# Patient Record
Sex: Female | Born: 1950 | Race: White | Hispanic: No | Smoking: Never smoker
Health system: Southern US, Community
[De-identification: ages and names within clinical notes are randomized; demographics above are authoritative.]

## PROBLEM LIST (undated history)

## (undated) DIAGNOSIS — Z9889 Other specified postprocedural states: Secondary | ICD-10-CM

## (undated) DIAGNOSIS — H9190 Unspecified hearing loss, unspecified ear: Secondary | ICD-10-CM

## (undated) DIAGNOSIS — E119 Type 2 diabetes mellitus without complications: Secondary | ICD-10-CM

## (undated) DIAGNOSIS — F419 Anxiety disorder, unspecified: Secondary | ICD-10-CM

## (undated) DIAGNOSIS — I700 Atherosclerosis of aorta: Secondary | ICD-10-CM

## (undated) DIAGNOSIS — G43909 Migraine, unspecified, not intractable, without status migrainosus: Secondary | ICD-10-CM

## (undated) DIAGNOSIS — K219 Gastro-esophageal reflux disease without esophagitis: Secondary | ICD-10-CM

## (undated) DIAGNOSIS — Z7982 Long term (current) use of aspirin: Secondary | ICD-10-CM

## (undated) DIAGNOSIS — I10 Essential (primary) hypertension: Secondary | ICD-10-CM

## (undated) DIAGNOSIS — N189 Chronic kidney disease, unspecified: Secondary | ICD-10-CM

## (undated) DIAGNOSIS — Z7901 Long term (current) use of anticoagulants: Secondary | ICD-10-CM

## (undated) DIAGNOSIS — R112 Nausea with vomiting, unspecified: Secondary | ICD-10-CM

## (undated) DIAGNOSIS — D689 Coagulation defect, unspecified: Secondary | ICD-10-CM

## (undated) DIAGNOSIS — F329 Major depressive disorder, single episode, unspecified: Secondary | ICD-10-CM

## (undated) DIAGNOSIS — R519 Headache, unspecified: Secondary | ICD-10-CM

## (undated) DIAGNOSIS — N200 Calculus of kidney: Secondary | ICD-10-CM

## (undated) DIAGNOSIS — E538 Deficiency of other specified B group vitamins: Secondary | ICD-10-CM

## (undated) DIAGNOSIS — Z86711 Personal history of pulmonary embolism: Secondary | ICD-10-CM

## (undated) DIAGNOSIS — K559 Vascular disorder of intestine, unspecified: Secondary | ICD-10-CM

## (undated) DIAGNOSIS — F32A Depression, unspecified: Secondary | ICD-10-CM

## (undated) DIAGNOSIS — Z8619 Personal history of other infectious and parasitic diseases: Secondary | ICD-10-CM

## (undated) DIAGNOSIS — R7303 Prediabetes: Secondary | ICD-10-CM

## (undated) DIAGNOSIS — Z87442 Personal history of urinary calculi: Secondary | ICD-10-CM

## (undated) DIAGNOSIS — E785 Hyperlipidemia, unspecified: Secondary | ICD-10-CM

## (undated) HISTORY — DX: Vascular disorder of intestine, unspecified: K55.9

## (undated) HISTORY — PX: APPENDECTOMY: SHX54

## (undated) HISTORY — DX: Personal history of other infectious and parasitic diseases: Z86.19

## (undated) HISTORY — DX: Essential (primary) hypertension: I10

## (undated) HISTORY — DX: Chronic kidney disease, unspecified: N18.9

## (undated) HISTORY — DX: Personal history of pulmonary embolism: Z86.711

## (undated) HISTORY — DX: Hyperlipidemia, unspecified: E78.5

## (undated) HISTORY — PX: OTHER SURGICAL HISTORY: SHX169

## (undated) HISTORY — DX: Anxiety disorder, unspecified: F41.9

---
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
## 1982-01-06 HISTORY — PX: SPINE SURGERY: SHX786

## 1987-01-07 HISTORY — PX: BREAST EXCISIONAL BIOPSY: SUR124

## 1987-01-07 HISTORY — PX: BREAST SURGERY: SHX581

## 1994-01-06 HISTORY — PX: ABDOMINAL HYSTERECTOMY: SHX81

## 1999-01-07 DIAGNOSIS — Z8619 Personal history of other infectious and parasitic diseases: Secondary | ICD-10-CM

## 1999-01-07 HISTORY — DX: Personal history of other infectious and parasitic diseases: Z86.19

## 2004-12-11 ENCOUNTER — Ambulatory Visit: Payer: Self-pay | Admitting: Internal Medicine

## 2005-06-19 ENCOUNTER — Ambulatory Visit: Payer: Self-pay | Admitting: Internal Medicine

## 2006-04-06 LAB — HM COLONOSCOPY

## 2006-09-30 ENCOUNTER — Ambulatory Visit: Payer: Self-pay | Admitting: Internal Medicine

## 2006-11-11 ENCOUNTER — Ambulatory Visit: Payer: Self-pay | Admitting: Unknown Physician Specialty

## 2007-11-19 ENCOUNTER — Ambulatory Visit: Payer: Self-pay | Admitting: Internal Medicine

## 2008-07-24 ENCOUNTER — Ambulatory Visit: Payer: Self-pay | Admitting: Internal Medicine

## 2009-07-05 ENCOUNTER — Ambulatory Visit: Payer: Self-pay | Admitting: Unknown Physician Specialty

## 2009-07-06 ENCOUNTER — Ambulatory Visit: Payer: Self-pay | Admitting: Internal Medicine

## 2009-07-25 ENCOUNTER — Ambulatory Visit: Payer: Self-pay | Admitting: Unknown Physician Specialty

## 2009-07-27 LAB — PATHOLOGY REPORT

## 2009-08-02 ENCOUNTER — Inpatient Hospital Stay: Payer: Self-pay | Admitting: Unknown Physician Specialty

## 2009-08-06 ENCOUNTER — Ambulatory Visit: Payer: Self-pay | Admitting: Internal Medicine

## 2009-08-08 DIAGNOSIS — D689 Coagulation defect, unspecified: Secondary | ICD-10-CM

## 2009-08-08 HISTORY — DX: Coagulation defect, unspecified: D68.9

## 2009-08-23 ENCOUNTER — Other Ambulatory Visit: Payer: Self-pay | Admitting: Unknown Physician Specialty

## 2009-09-06 ENCOUNTER — Ambulatory Visit: Payer: Self-pay | Admitting: Internal Medicine

## 2009-09-06 DIAGNOSIS — Z86711 Personal history of pulmonary embolism: Secondary | ICD-10-CM

## 2009-09-06 HISTORY — DX: Personal history of pulmonary embolism: Z86.711

## 2009-09-07 ENCOUNTER — Inpatient Hospital Stay: Payer: Self-pay | Admitting: Internal Medicine

## 2009-09-11 LAB — CANCER ANTIGEN 19-9: CA 19-9: 21 U/mL (ref 0–35)

## 2009-09-26 ENCOUNTER — Ambulatory Visit: Payer: Self-pay | Admitting: Internal Medicine

## 2009-10-06 ENCOUNTER — Ambulatory Visit: Payer: Self-pay | Admitting: Internal Medicine

## 2009-10-18 ENCOUNTER — Ambulatory Visit: Payer: Self-pay | Admitting: Internal Medicine

## 2009-11-01 ENCOUNTER — Inpatient Hospital Stay: Payer: Self-pay | Admitting: Internal Medicine

## 2009-11-13 ENCOUNTER — Ambulatory Visit: Payer: Self-pay | Admitting: Internal Medicine

## 2010-02-27 ENCOUNTER — Ambulatory Visit: Payer: Self-pay | Admitting: Internal Medicine

## 2010-03-07 ENCOUNTER — Ambulatory Visit: Payer: Self-pay | Admitting: Internal Medicine

## 2010-04-07 ENCOUNTER — Ambulatory Visit: Payer: Self-pay

## 2010-04-07 ENCOUNTER — Ambulatory Visit: Payer: Self-pay | Admitting: Internal Medicine

## 2010-04-08 LAB — HM COLONOSCOPY

## 2010-05-08 ENCOUNTER — Ambulatory Visit: Payer: Self-pay | Admitting: Vascular Surgery

## 2010-07-04 ENCOUNTER — Ambulatory Visit: Payer: Self-pay | Admitting: Internal Medicine

## 2010-09-03 ENCOUNTER — Other Ambulatory Visit: Payer: Self-pay | Admitting: Internal Medicine

## 2010-09-03 NOTE — Telephone Encounter (Signed)
This was sent to me in error.

## 2010-09-03 NOTE — Telephone Encounter (Signed)
OK TO REFILL  WITH 3 ADDITIONAL REFILLS  

## 2010-09-04 ENCOUNTER — Other Ambulatory Visit: Payer: Self-pay | Admitting: Internal Medicine

## 2010-09-04 MED ORDER — ALPRAZOLAM 0.25 MG PO TABS: 0.2500 mg | ORAL_TABLET | Freq: Three times a day (TID) | ORAL | Status: DC | PRN

## 2010-09-05 ENCOUNTER — Telehealth: Payer: Self-pay | Admitting: Internal Medicine

## 2010-09-05 NOTE — Telephone Encounter (Signed)
Patient called and stated she had her PT/INR test done last week and still has not heard her results.

## 2010-09-06 ENCOUNTER — Telehealth: Payer: Self-pay | Admitting: Internal Medicine

## 2010-09-06 NOTE — Telephone Encounter (Signed)
This refill has already been sent to the pharmacy.  I called it in myself.

## 2010-09-06 NOTE — Telephone Encounter (Signed)
Please send Xanax refill to [facility]

## 2010-09-30 ENCOUNTER — Other Ambulatory Visit (INDEPENDENT_AMBULATORY_CARE_PROVIDER_SITE_OTHER): Payer: 59 | Admitting: *Deleted

## 2010-09-30 ENCOUNTER — Other Ambulatory Visit: Payer: Self-pay

## 2010-09-30 DIAGNOSIS — Z7901 Long term (current) use of anticoagulants: Secondary | ICD-10-CM

## 2010-10-01 LAB — PROTIME-INR

## 2010-10-29 ENCOUNTER — Other Ambulatory Visit (INDEPENDENT_AMBULATORY_CARE_PROVIDER_SITE_OTHER): Payer: 59 | Admitting: *Deleted

## 2010-10-29 DIAGNOSIS — Z7901 Long term (current) use of anticoagulants: Secondary | ICD-10-CM

## 2010-11-01 ENCOUNTER — Telehealth: Payer: Self-pay | Admitting: Internal Medicine

## 2010-11-01 DIAGNOSIS — Z7901 Long term (current) use of anticoagulants: Secondary | ICD-10-CM

## 2010-11-01 NOTE — Telephone Encounter (Signed)
Patient was called and told to increase her coumadin to 6 mg on Fridays.  Continue 5 mg on Sat,, Mon Tues and Thursday. Repeat PT/INR in 1 month.  Order in [facility]

## 2010-11-01 NOTE — Telephone Encounter (Signed)
Message copied by Duncan Dull on Fri Nov 01, 2010  6:16 PM ------      Message from: Darletta Moll A      Created: Fri Nov 01, 2010 12:13 PM       Patient stated she is taking 5 mg everyday except for Sunday and Wednesday she takes 6 mg.  Please advise.

## 2010-11-21 ENCOUNTER — Other Ambulatory Visit: Payer: Self-pay | Admitting: Internal Medicine

## 2010-11-22 ENCOUNTER — Telehealth: Payer: Self-pay | Admitting: Internal Medicine

## 2010-11-22 MED ORDER — WARFARIN SODIUM 5 MG PO TABS: 5.0000 mg | ORAL_TABLET | Freq: Every day | ORAL | Status: DC

## 2010-11-22 NOTE — Telephone Encounter (Signed)
I spoke with [name] about the prescription process that even though Dr. Darrick Huntsman sent to pharmacy it doesn't immediately show up at the pharmacy.

## 2010-11-22 NOTE — Telephone Encounter (Signed)
Patient returned my call I advised that Morrie Sheldon spoke with pharmacy and gave them a verbal on the prescription.  She questioned again why would the pharmacy say that they hadn't received the prescription.  I advised that I would contact our IT dept. And see if this was a problem across the system.

## 2010-11-22 NOTE — Telephone Encounter (Signed)
Patient called in this a.m stating that our system needed to get better.  She said that we were going to start loosing patients and she was going to be one of them.  I asked her what was wrong and she it takes a week to get her PT/INR results when she comes in.  When she calls in to get her prescription it takes several days to get them called in.  She stated that she called her pharmacy for her coumadin prescription yesterday they faxed to [place], and then electronically sent it in.  I looked in her chart and saw that her prescription was sent in by Dr. Darrick Huntsman this morning at 6:46, patient states she had just got off the phone with the pharmacy before calling us and they didn't have it.  I called Carlyon Shadow. Into my office to make sure I was looking at it right, she said yes.  I asked the patient if I could call her back after I called the pharmacy she said yes.  I called the pharmacy to see if they had received the prescription she said no, so I asked Morrie Sheldon C. To give prescription verbally. Called the patient back and left a message for her to return my call.

## 2010-12-02 ENCOUNTER — Other Ambulatory Visit (INDEPENDENT_AMBULATORY_CARE_PROVIDER_SITE_OTHER): Payer: 59 | Admitting: *Deleted

## 2010-12-02 DIAGNOSIS — Z7901 Long term (current) use of anticoagulants: Secondary | ICD-10-CM

## 2010-12-03 ENCOUNTER — Other Ambulatory Visit: Payer: Self-pay | Admitting: Internal Medicine

## 2010-12-03 DIAGNOSIS — Z7901 Long term (current) use of anticoagulants: Secondary | ICD-10-CM

## 2010-12-09 ENCOUNTER — Telehealth: Payer: Self-pay | Admitting: Internal Medicine

## 2010-12-09 ENCOUNTER — Other Ambulatory Visit (INDEPENDENT_AMBULATORY_CARE_PROVIDER_SITE_OTHER): Payer: 59 | Admitting: *Deleted

## 2010-12-09 DIAGNOSIS — Z7901 Long term (current) use of anticoagulants: Secondary | ICD-10-CM

## 2010-12-09 LAB — PROTIME-INR

## 2010-12-09 NOTE — Telephone Encounter (Signed)
Notified patient [facility] did not result her labs because they said "Testing not performed due to the age of this specimen".  The labs were drawn on 12/02/10 around 4:30 that evening, [facility] was called by Morrie Sheldon C notifying them we had a lab to pick up.  [facility] did not come get the specimen until the following day (12/03/10).  On the results form [facility] did not result the labs until 12/05/10 by which it was an old specimen.  We did not receive the results form with all of this information until 12/06/10.  Patient has been faxed a copy of the form, she stated she is friends with the manager in that department and will let her know of this.  Patient is an employee of [facility].  Patient came back today to have the lab redrawn, and we have called [facility] and made it a STAT lab.

## 2010-12-09 NOTE — Progress Notes (Signed)
Addended by: Jobie Quaker on: 12/09/2010 01:45 PM   Modules accepted: Orders

## 2010-12-25 ENCOUNTER — Other Ambulatory Visit: Payer: Self-pay | Admitting: *Deleted

## 2010-12-25 MED ORDER — WARFARIN SODIUM 5 MG PO TABS: ORAL_TABLET | ORAL | Status: DC

## 2010-12-26 ENCOUNTER — Telehealth: Payer: Self-pay | Admitting: Internal Medicine

## 2010-12-26 MED ORDER — WARFARIN SODIUM 6 MG PO TABS: 6.0000 mg | ORAL_TABLET | Freq: Every day | ORAL | Status: DC

## 2010-12-26 NOTE — Telephone Encounter (Signed)
[phone] Pt called her pharmacy faxed request for 6mg  [facility] Pt is completely  out of meds Please advise pt when this was called in You can leave message on phone

## 2011-01-01 ENCOUNTER — Other Ambulatory Visit: Payer: Self-pay | Admitting: *Deleted

## 2011-01-03 NOTE — Telephone Encounter (Signed)
Opened in error

## 2011-01-09 ENCOUNTER — Other Ambulatory Visit: Payer: Self-pay | Admitting: Internal Medicine

## 2011-01-09 ENCOUNTER — Telehealth: Payer: Self-pay | Admitting: Internal Medicine

## 2011-01-09 DIAGNOSIS — Z7901 Long term (current) use of anticoagulants: Secondary | ICD-10-CM

## 2011-01-09 NOTE — Telephone Encounter (Signed)
Standing order faxed to [facility]

## 2011-01-09 NOTE — Telephone Encounter (Signed)
[phone] Pt called she was to have protime done at [facility] on [address] today because she is [facility] employees.  The standing order was not there for them to do her labs.  Pt was under the impression this was already done with you Please call and advise pt

## 2011-01-12 ENCOUNTER — Telehealth: Payer: Self-pay | Admitting: Internal Medicine

## 2011-01-12 NOTE — Telephone Encounter (Signed)
Her INR on Jan  Was 1.9

## 2011-01-12 NOTE — Telephone Encounter (Signed)
Her recent PT/INR was 1.9  If she is currently taking 6 mg daily , I would like her to take 9 mg two days per week and 6 mg all other days and repeat  PT/INR in 2 weeks  . She gets her PT/INR at [facility] so she will need na order placed.

## 2011-01-13 NOTE — Telephone Encounter (Signed)
No , she should increase to 6 mg daily every day and please update her chart with correct regimen.,  thanks

## 2011-01-13 NOTE — Telephone Encounter (Signed)
Advised pt of instructions.  Do you still want her to recheck in 2 weeks?

## 2011-01-13 NOTE — Telephone Encounter (Signed)
Pt states she is taking 6 mg's three days a week and 5 the other days.  Do you still want her to increase to 9 two days a week?

## 2011-01-13 NOTE — Telephone Encounter (Signed)
Yes, thank you.

## 2011-01-14 NOTE — Telephone Encounter (Signed)
Advised pt, she will fax over standing order for your signature.

## 2011-01-22 ENCOUNTER — Encounter: Payer: Self-pay | Admitting: Internal Medicine

## 2011-01-29 ENCOUNTER — Telehealth: Payer: Self-pay | Admitting: Internal Medicine

## 2011-01-29 NOTE — Telephone Encounter (Signed)
Patient notified

## 2011-01-29 NOTE — Telephone Encounter (Signed)
Her PT/INR is fine at 2.8  Continue current coumadin schedule and repeat in on month

## 2011-02-10 ENCOUNTER — Encounter: Payer: Self-pay | Admitting: Internal Medicine

## 2011-02-26 LAB — PROTIME-INR

## 2011-03-03 ENCOUNTER — Telehealth: Payer: Self-pay | Admitting: Internal Medicine

## 2011-03-03 NOTE — Telephone Encounter (Signed)
PT/INR is therapeutic at 2.5  Repeat in on e month.  Patient has already been called.

## 2011-03-03 NOTE — Telephone Encounter (Signed)
Patient has been notified

## 2011-03-18 ENCOUNTER — Encounter: Payer: Self-pay | Admitting: Internal Medicine

## 2011-03-27 ENCOUNTER — Telehealth: Payer: Self-pay | Admitting: Internal Medicine

## 2011-03-27 NOTE — Telephone Encounter (Signed)
Please tell [name] that her PT/INR is fine on 6 mg daily.  Repeat in 1 month

## 2011-03-28 NOTE — Telephone Encounter (Signed)
Notified patient of results 

## 2011-04-06 ENCOUNTER — Encounter: Payer: Self-pay | Admitting: Internal Medicine

## 2011-04-06 DIAGNOSIS — Z86711 Personal history of pulmonary embolism: Secondary | ICD-10-CM | POA: Insufficient documentation

## 2011-04-06 DIAGNOSIS — Z8619 Personal history of other infectious and parasitic diseases: Secondary | ICD-10-CM | POA: Insufficient documentation

## 2011-04-06 DIAGNOSIS — I10 Essential (primary) hypertension: Secondary | ICD-10-CM | POA: Insufficient documentation

## 2011-04-08 ENCOUNTER — Encounter: Payer: Self-pay | Admitting: Internal Medicine

## 2011-04-08 ENCOUNTER — Ambulatory Visit (INDEPENDENT_AMBULATORY_CARE_PROVIDER_SITE_OTHER): Payer: 59 | Admitting: Internal Medicine

## 2011-04-08 VITALS — BP 112/64 | HR 89 | Temp 97.9°F | Resp 16 | Ht 61.5 in | Wt 162.8 lb

## 2011-04-08 DIAGNOSIS — K551 Chronic vascular disorders of intestine: Secondary | ICD-10-CM

## 2011-04-08 DIAGNOSIS — Z79899 Other long term (current) drug therapy: Secondary | ICD-10-CM

## 2011-04-08 DIAGNOSIS — Z1239 Encounter for other screening for malignant neoplasm of breast: Secondary | ICD-10-CM

## 2011-04-08 DIAGNOSIS — R5383 Other fatigue: Secondary | ICD-10-CM

## 2011-04-08 DIAGNOSIS — I10 Essential (primary) hypertension: Secondary | ICD-10-CM

## 2011-04-08 DIAGNOSIS — Z86711 Personal history of pulmonary embolism: Secondary | ICD-10-CM

## 2011-04-08 DIAGNOSIS — E785 Hyperlipidemia, unspecified: Secondary | ICD-10-CM

## 2011-04-08 HISTORY — DX: Chronic vascular disorders of intestine: K55.1

## 2011-04-08 MED ORDER — ALPRAZOLAM 0.25 MG PO TABS: 0.2500 mg | ORAL_TABLET | Freq: Three times a day (TID) | ORAL | Status: AC | PRN

## 2011-04-08 MED ORDER — IMIPRAMINE HCL 25 MG PO TABS: 25.0000 mg | ORAL_TABLET | Freq: Every day | ORAL | Status: DC

## 2011-04-08 MED ORDER — SIMVASTATIN 40 MG PO TABS: 40.0000 mg | ORAL_TABLET | Freq: Every day | ORAL | Status: DC

## 2011-04-08 MED ORDER — BUTALBITAL-APAP-CAFFEINE 50-325-40 MG PO TABS: 1.0000 | ORAL_TABLET | ORAL | Status: DC | PRN

## 2011-04-08 MED ORDER — LISINOPRIL-HYDROCHLOROTHIAZIDE 20-25 MG PO TABS: 1.0000 | ORAL_TABLET | Freq: Every day | ORAL | Status: DC

## 2011-04-08 MED ORDER — OXYCODONE-ACETAMINOPHEN 10-325 MG PO TABS: 1.0000 | ORAL_TABLET | Freq: Three times a day (TID) | ORAL | Status: AC | PRN

## 2011-04-08 MED ORDER — METOPROLOL SUCCINATE ER 25 MG PO TB24: 25.0000 mg | ORAL_TABLET | Freq: Every day | ORAL | Status: DC

## 2011-04-08 NOTE — Assessment & Plan Note (Signed)
Well controlled, but having recurrent orthostasis.  Will change meds to toprol as a trial.

## 2011-04-08 NOTE — Patient Instructions (Addendum)
My cell phone is [phone], , my  E mail is  [email or web address]  We are switching bp meds to toprol xl 25   One tablet daily to see if it helps the dizziness

## 2011-04-08 NOTE — Progress Notes (Signed)
Patient ID: [name], female   DOB: [birth year], 61 y.o.   MRN: [id number]   Patient Active Problem List  Diagnoses  . Hypertension  . H/O toxic shock syndrome  . History of pulmonary embolus (PE)  . Chronic ischemic colitis, enteritis, or enterocolitis    Subjective:  CC:   Chief Complaint  Patient presents with  . Annual Exam    HPI:   Emma Huyett Handyis a 61 y.o. female who presents 8 month follow up for annual exam.   She is s/p hysterectomy and does not need a pelvic or PAP smear .  Does her own  monthly breast exams,  Has a tender area on left breast which comes and goes,  Due for mammogram.  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   Past Medical History  Diagnosis Date  . Hypertension   . Hyperlipidemia   . H/O toxic shock syndrome 2001    post urologic procedure for stone removal  . History of pulmonary embolus (PE) Sept 2011    secondary to Protein c& S deficiency, Lupus ACA    Past Surgical History  Procedure Date  . Spine surgery 1984    lumbar diskectomy, [place]  . Breast surgery 1989    left biopsy, normal  . Abdominal hysterectomy 1996         The following portions of the patient's history were reviewed and updated as appropriate: Allergies, current medications, and problem list.    Review of Systems:   12 Pt  review of systems was negative except those addressed in the HPI,     History   Social History  . Marital Status: Married    Spouse Name: N/A    Number of Children: N/A  . Years of Education: N/A   Occupational History  . Not on file.   Social History Main Topics  . Smoking status: Never Smoker   . Smokeless tobacco: Never Used  . Alcohol Use: No  . Drug Use: No  . Sexually Active: Not on file   Other  Topics Concern  . Not on file   Social History Narrative  . No narrative on file    Objective:  BP 112/64  Pulse 89  Temp(Src) 97.9 F (36.6 C) (Oral)  Resp 16  Ht 5' 1.5" (1.562 m)  Wt 162 lb 12 oz (73.823 kg)  BMI 30.25 kg/m2  SpO2 96%  General appearance: alert, cooperative and appears stated age Ears: normal TM's and external ear canals both ears Throat: lips, mucosa, and tongue normal; teeth and gums normal Neck: no adenopathy, no carotid bruit, supple, symmetrical, trachea midline and thyroid not enlarged, symmetric, no tenderness/mass/nodules Back: symmetric, no curvature. ROM normal. No CVA tenderness. Lungs: clear to auscultation bilaterally Heart: regular rate and rhythm, S1, S2 normal, no murmur, click, rub or gallop Abdomen: soft, non-tender; bowel sounds normal; no masses,  no organomegaly Pulses: 2+ and symmetric Skin: Skin color, texture, turgor normal. No rashes or lesions Lymph nodes: Cervical, supraclavicular, and axillary nodes normal.  Assessment and Plan:  Hypertension Well controlled, but having recurrent orthostasis.  Will change meds to toprol as a trial.   Chronic ischemic colitis, enteritis, or enterocolitis S/p attempted angioplasty by [facility].  Vessel too small.  Tried entocort by [employer] which calmed things down  for a while.  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  Lifelong coumadin recommended.  Monthly PT/INRs     Updated Medication List Outpatient Encounter Prescriptions as of 04/08/2011  Medication Sig Dispense Refill  . ALPRAZolam (XANAX) 0.25 MG tablet Take 1 tablet (0.25 mg total) by mouth 3 (three) times daily as needed for anxiety.  270 tablet  3  . aspirin 81 MG tablet Take 81 mg by mouth daily.      . butalbital-acetaminophen-caffeine (FIORICET, ESGIC) 50-325-40 MG per tablet Take 1 tablet by mouth as needed for headache.  90 tablet  3  .  Cholecalciferol (VITAMIN D3) 1000 UNITS CAPS Take by mouth.      Marland Kitchen imipramine (TOFRANIL) 25 MG tablet Take 1 tablet (25 mg total) by mouth at bedtime.  90 tablet  3  . omeprazole (PRILOSEC) 20 MG capsule Take 20 mg by mouth daily.      Marland Kitchen saccharomyces boulardii (FLORASTOR) 250 MG capsule Take 250 mg by mouth daily.      . simvastatin (ZOCOR) 40 MG tablet Take 1 tablet (40 mg total) by mouth at bedtime.  90 tablet  3  . warfarin (COUMADIN) 6 MG tablet Take 1 tablet (6 mg total) by mouth daily.  30 tablet  6  . DISCONTD: ALPRAZolam (XANAX) 0.25 MG tablet Take 1 tablet (0.25 mg total) by mouth 3 (three) times daily as needed for anxiety.  30 tablet  3  . DISCONTD: butalbital-acetaminophen-caffeine (FIORICET, ESGIC) 50-325-40 MG per tablet TAKE 1 TABLET BY MOUTH EVERY DAY AS NEEDED FOR PAIN  30 tablet  5  . DISCONTD: imipramine (TOFRANIL) 25 MG tablet Take 25 mg by mouth at bedtime.      Marland Kitchen DISCONTD: lisinopril-hydrochlorothiazide (PRINZIDE,ZESTORETIC) 20-25 MG per tablet Take 1 tablet by mouth daily.      Marland Kitchen DISCONTD: lisinopril-hydrochlorothiazide (PRINZIDE,ZESTORETIC) 20-25 MG per tablet Take 1 tablet by mouth daily.  90 tablet  3  . DISCONTD: simvastatin (ZOCOR) 40 MG tablet TAKE 1 TABLET BY MOUTH DAILY  90 tablet  2  . metoprolol succinate (TOPROL-XL) 25 MG 24 hr tablet Take 1 tablet (25 mg total) by mouth daily.  30 tablet  3  . oxyCODONE-acetaminophen (PERCOCET) 10-325 MG per tablet Take 1 tablet by mouth every 8 (eight) hours as needed for pain.  30 tablet  0     Orders Placed This Encounter  Procedures  . MM Digital Screening  . CBC with Differential  . Hepatic function panel  . Basic metabolic panel  . Lipid panel  . TSH  . HM COLONOSCOPY    No Follow-up on file.

## 2011-04-08 NOTE — Assessment & Plan Note (Signed)
Secondary to Protein C  And S deficiency.  Lifelong coumadin recommended.  Monthly PT/INRs

## 2011-04-08 NOTE — Assessment & Plan Note (Signed)
S/p attempted angioplasty by [employer].  Vessel too small.  Tried entocort by Mechele Collin which calmed things down for a while.  but she does not have a diagnosis of Crohn's

## 2011-04-17 ENCOUNTER — Other Ambulatory Visit: Payer: Self-pay | Admitting: Internal Medicine

## 2011-04-19 LAB — COMPREHENSIVE METABOLIC PANEL
ALT: 22 IU/L (ref 0–32)
AST: 19 IU/L (ref 0–40)
Albumin/Globulin Ratio: 2.1 (ref 1.1–2.5)
Alkaline Phosphatase: 84 IU/L (ref 25–165)
BUN/Creatinine Ratio: 21 (ref 11–26)
Calcium: 9.1 mg/dL (ref 8.6–10.2)
Creatinine, Ser: 0.94 mg/dL (ref 0.57–1.00)
GFR calc Af Amer: 75 mL/min/{1.73_m2} (ref >59)
GFR calc non Af Amer: 65 mL/min/{1.73_m2} (ref >59)
Globulin, Total: 2 g/dL (ref 1.5–4.5)
Potassium: 3.8 mmol/L (ref 3.5–5.2)
Sodium: 140 mmol/L (ref 134–144)
Total Bilirubin: 0.3 mg/dL (ref 0.0–1.2)

## 2011-04-19 LAB — CBC WITH DIFFERENTIAL
Basophils Absolute: 0 x10E3/uL (ref 0.0–0.2)
Basos: 1 % (ref 0–3)
Eos: 5 % (ref 0–7)
Eosinophils Absolute: 0.2 x10E3/uL (ref 0.0–0.4)
HCT: 43.8 % (ref 34.0–46.6)
Hemoglobin: 14.4 g/dL (ref 11.1–15.9)
Immature Grans (Abs): 0 x10E3/uL (ref 0.0–0.1)
Immature Granulocytes: 0 % (ref 0–2)
Lymphocytes Absolute: 1.8 x10E3/uL (ref 0.7–4.5)
Lymphs: 42 % (ref 14–46)
MCH: 28.9 pg (ref 26.6–33.0)
MCHC: 32.9 g/dL (ref 31.5–35.7)
MCV: 88 fL (ref 79–97)
Monocytes Absolute: 0.4 x10E3/uL (ref 0.1–1.0)
Monocytes: 10 % (ref 4–13)
Neutrophils Absolute: 1.8 x10E3/uL (ref 1.8–7.8)
Neutrophils Relative %: 42 % (ref 40–74)
Platelets: 176 x10E3/uL (ref 140–415)
RBC: 4.98 x10E6/uL (ref 3.77–5.28)
RDW: 14.3 % (ref 12.3–15.4)
WBC: 4.2 x10E3/uL (ref 4.0–10.5)

## 2011-04-19 LAB — LIPID PANEL W/O CHOL/HDL RATIO: HDL: 53 mg/dL (ref >39)

## 2011-04-21 ENCOUNTER — Other Ambulatory Visit: Payer: Self-pay | Admitting: Internal Medicine

## 2011-04-21 DIAGNOSIS — R5383 Other fatigue: Secondary | ICD-10-CM

## 2011-04-30 LAB — PROTIME-INR

## 2011-05-04 ENCOUNTER — Inpatient Hospital Stay: Payer: Self-pay | Admitting: Internal Medicine

## 2011-05-04 LAB — COMPREHENSIVE METABOLIC PANEL
Albumin: 3.7 g/dL (ref 3.4–5.0)
Alkaline Phosphatase: 105 U/L (ref 50–136)
Anion Gap: 8 (ref 7–16)
Calcium, Total: 8.8 mg/dL (ref 8.5–10.1)
Chloride: 105 mmol/L (ref 98–107)
Co2: 30 mmol/L (ref 21–32)
Creatinine: 1.23 mg/dL (ref 0.60–1.30)
Osmolality: 288 (ref 275–301)
Potassium: 3.8 mmol/L (ref 3.5–5.1)
SGOT(AST): 26 U/L (ref 15–37)
SGPT (ALT): 29 U/L
Sodium: 143 mmol/L (ref 136–145)

## 2011-05-04 LAB — CBC
HCT: 42.8 % (ref 35.0–47.0)
MCV: 87 fL (ref 80–100)
RBC: 4.91 10*6/uL (ref 3.80–5.20)
WBC: 13 10*3/uL — ABNORMAL HIGH (ref 3.6–11.0)

## 2011-05-04 LAB — LIPASE, BLOOD: Lipase: 170 U/L (ref 73–393)

## 2011-05-04 LAB — URINALYSIS, COMPLETE
Bilirubin,UR: NEGATIVE
Blood: NEGATIVE
Glucose,UR: NEGATIVE mg/dL (ref 0–75)
Granular Cast: 12
Ketone: NEGATIVE
Ph: 6 (ref 4.5–8.0)
Protein: NEGATIVE
RBC,UR: 1 /HPF (ref 0–5)
WBC UR: 3 /HPF (ref 0–5)

## 2011-05-04 LAB — PROTIME-INR
INR: 2.2
Prothrombin Time: 24.4 secs — ABNORMAL HIGH (ref 11.5–14.7)

## 2011-05-05 LAB — CBC WITH DIFFERENTIAL/PLATELET
Eosinophil #: 0.2 10*3/uL (ref 0.0–0.7)
Eosinophil %: 5.2 %
HCT: 37 % (ref 35.0–47.0)
Lymphocyte #: 1.8 10*3/uL (ref 1.0–3.6)
Lymphocyte %: 39.9 %
MCH: 28.9 pg (ref 26.0–34.0)
MCHC: 32.9 g/dL (ref 32.0–36.0)
MCV: 88 fL (ref 80–100)
Monocyte #: 0.3 x10 3/mm (ref 0.2–0.9)
Monocyte %: 7.2 %
Neutrophil %: 47 %
Platelet: 159 10*3/uL (ref 150–440)
RBC: 4.2 10*6/uL (ref 3.80–5.20)
RDW: 14.1 % (ref 11.5–14.5)
WBC: 4.5 10*3/uL (ref 3.6–11.0)

## 2011-05-05 LAB — BASIC METABOLIC PANEL
Anion Gap: 6 — ABNORMAL LOW (ref 7–16)
BUN: 11 mg/dL (ref 7–18)
Chloride: 109 mmol/L — ABNORMAL HIGH (ref 98–107)
Glucose: 98 mg/dL (ref 65–99)
Osmolality: 282 (ref 275–301)

## 2011-05-05 LAB — PROTIME-INR: Prothrombin Time: 29.4 secs — ABNORMAL HIGH (ref 11.5–14.7)

## 2011-05-06 ENCOUNTER — Telehealth: Payer: Self-pay | Admitting: Internal Medicine

## 2011-05-06 DIAGNOSIS — Z7901 Long term (current) use of anticoagulants: Secondary | ICD-10-CM

## 2011-05-06 LAB — OCCULT BLOOD X 1 CARD TO LAB, STOOL: Occult Blood, Feces: NEGATIVE

## 2011-05-06 LAB — PROTIME-INR: INR: 2.8

## 2011-05-06 NOTE — Telephone Encounter (Signed)
Left detailed message notifying patient. Also, the reason that these are not coming across through the computer because she was given a written order for 12 months for her pt/inr, they only come back through the computer when they are ordered through the computer.

## 2011-05-06 NOTE — Telephone Encounter (Signed)
Ok, thanks.  As dicussed, can we cancel them out and redo them through the portal?  Patient is in the hospital right now so do not worry about trying to reach her.

## 2011-05-06 NOTE — Telephone Encounter (Signed)
Coumadin level is therapeutic.  I don't understand why we aren't receiving these in a timelier fashion through the [facility] portal.,  Morrie Sheldon can you look into this?

## 2011-05-06 NOTE — Telephone Encounter (Signed)
Okay orders entered through computer so next time they should come across into her chart.

## 2011-05-07 HISTORY — PX: COLECTOMY: SHX59

## 2011-05-07 LAB — PROTIME-INR
INR: 3.1
Prothrombin Time: 32.3 secs — ABNORMAL HIGH (ref 11.5–14.7)

## 2011-05-07 LAB — BASIC METABOLIC PANEL
Calcium, Total: 8 mg/dL — ABNORMAL LOW (ref 8.5–10.1)
Chloride: 108 mmol/L — ABNORMAL HIGH (ref 98–107)
Co2: 26 mmol/L (ref 21–32)
Creatinine: 0.96 mg/dL (ref 0.60–1.30)
EGFR (African American): >60
EGFR (Non-African Amer.): >60
Glucose: 106 mg/dL — ABNORMAL HIGH (ref 65–99)
Osmolality: 286 (ref 275–301)
Potassium: 3.5 mmol/L (ref 3.5–5.1)
Sodium: 144 mmol/L (ref 136–145)

## 2011-05-13 ENCOUNTER — Inpatient Hospital Stay: Payer: Self-pay | Admitting: Internal Medicine

## 2011-05-13 LAB — CBC WITH DIFFERENTIAL/PLATELET
Basophil #: 0 10*3/uL (ref 0.0–0.1)
Basophil %: 0.8 %
Eosinophil #: 0.2 10*3/uL (ref 0.0–0.7)
Eosinophil %: 3 %
HCT: 40.1 % (ref 35.0–47.0)
HGB: 13.2 g/dL (ref 12.0–16.0)
Lymphocyte #: 2.5 10*3/uL (ref 1.0–3.6)
Lymphocyte %: 45 %
MCH: 29 pg (ref 26.0–34.0)
Monocyte #: 0.5 x10 3/mm (ref 0.2–0.9)
Monocyte %: 9.2 %
Neutrophil #: 2.3 10*3/uL (ref 1.4–6.5)
RBC: 4.55 10*6/uL (ref 3.80–5.20)
RDW: 14.3 % (ref 11.5–14.5)
WBC: 5.5 10*3/uL (ref 3.6–11.0)

## 2011-05-13 LAB — BASIC METABOLIC PANEL
Anion Gap: 6 — ABNORMAL LOW (ref 7–16)
Calcium, Total: 8.7 mg/dL (ref 8.5–10.1)
Chloride: 105 mmol/L (ref 98–107)
Creatinine: 0.93 mg/dL (ref 0.60–1.30)
EGFR (Non-African Amer.): >60
Glucose: 86 mg/dL (ref 65–99)
Osmolality: 285 (ref 275–301)
Sodium: 142 mmol/L (ref 136–145)

## 2011-05-13 LAB — PROTIME-INR
INR: 2.3
Prothrombin Time: 25.4 secs — ABNORMAL HIGH (ref 11.5–14.7)

## 2011-05-14 ENCOUNTER — Encounter: Payer: Self-pay | Admitting: Internal Medicine

## 2011-05-14 ENCOUNTER — Ambulatory Visit: Payer: 59 | Admitting: Internal Medicine

## 2011-05-14 LAB — BASIC METABOLIC PANEL
Anion Gap: 7 (ref 7–16)
Co2: 28 mmol/L (ref 21–32)
EGFR (African American): >60
EGFR (Non-African Amer.): >60
Glucose: 98 mg/dL (ref 65–99)
Osmolality: 277 (ref 275–301)
Sodium: 138 mmol/L (ref 136–145)

## 2011-05-14 LAB — CBC WITH DIFFERENTIAL/PLATELET
Basophil %: 1.3 %
Eosinophil #: 0.2 10*3/uL (ref 0.0–0.7)
Eosinophil %: 4.1 %
HCT: 37.7 % (ref 35.0–47.0)
MCH: 29 pg (ref 26.0–34.0)
MCHC: 33.2 g/dL (ref 32.0–36.0)
MCV: 87 fL (ref 80–100)
Monocyte #: 0.4 x10 3/mm (ref 0.2–0.9)
Monocyte %: 8.3 %
Neutrophil %: 33.6 %
RDW: 14.4 % (ref 11.5–14.5)

## 2011-05-14 LAB — APTT
Activated PTT: >160 secs (ref 23.6–35.9)
Activated PTT: 82.9 secs — ABNORMAL HIGH (ref 23.6–35.9)

## 2011-05-14 LAB — PROTIME-INR
INR: 2.4
Prothrombin Time: 26.3 secs — ABNORMAL HIGH (ref 11.5–14.7)

## 2011-05-14 LAB — HEMOGLOBIN: HGB: 12.4 g/dL (ref 12.0–16.0)

## 2011-05-15 LAB — BASIC METABOLIC PANEL
Anion Gap: 7 (ref 7–16)
Calcium, Total: 8 mg/dL — ABNORMAL LOW (ref 8.5–10.1)
Chloride: 106 mmol/L (ref 98–107)
Co2: 28 mmol/L (ref 21–32)
Creatinine: 0.86 mg/dL (ref 0.60–1.30)
EGFR (Non-African Amer.): >60
Glucose: 95 mg/dL (ref 65–99)
Potassium: 3.4 mmol/L — ABNORMAL LOW (ref 3.5–5.1)

## 2011-05-16 LAB — APTT
Activated PTT: 88.3 secs — ABNORMAL HIGH (ref 23.6–35.9)
Activated PTT: 66 secs — ABNORMAL HIGH (ref 23.6–35.9)

## 2011-05-16 LAB — HEMOGLOBIN: HGB: 12.4 g/dL (ref 12.0–16.0)

## 2011-05-16 LAB — PLATELET COUNT: Platelet: 141 10*3/uL — ABNORMAL LOW (ref 150–440)

## 2011-05-18 LAB — CBC WITH DIFFERENTIAL/PLATELET
Basophil #: 0 10*3/uL (ref 0.0–0.1)
HCT: 36.4 % (ref 35.0–47.0)
Lymphocyte #: 2.1 10*3/uL (ref 1.0–3.6)
Lymphocyte %: 48.7 %
MCH: 29.1 pg (ref 26.0–34.0)
MCV: 86 fL (ref 80–100)
Monocyte %: 9.3 %
Neutrophil #: 1.5 10*3/uL (ref 1.4–6.5)
Platelet: 142 10*3/uL — ABNORMAL LOW (ref 150–440)

## 2011-05-18 LAB — HEPATIC FUNCTION PANEL A (ARMC)
Albumin: 3.1 g/dL — ABNORMAL LOW (ref 3.4–5.0)
Alkaline Phosphatase: 58 U/L (ref 50–136)
Bilirubin, Direct: 0.1 mg/dL (ref 0.00–0.20)
Bilirubin,Total: 0.5 mg/dL (ref 0.2–1.0)
SGOT(AST): 28 U/L (ref 15–37)
SGPT (ALT): 28 U/L
Total Protein: 6.1 g/dL — ABNORMAL LOW (ref 6.4–8.2)

## 2011-05-18 LAB — BASIC METABOLIC PANEL
BUN: 6 mg/dL — ABNORMAL LOW (ref 7–18)
Calcium, Total: 8.2 mg/dL — ABNORMAL LOW (ref 8.5–10.1)
Chloride: 109 mmol/L — ABNORMAL HIGH (ref 98–107)
Co2: 24 mmol/L (ref 21–32)
EGFR (African American): >60
EGFR (Non-African Amer.): >60
Glucose: 95 mg/dL (ref 65–99)
Osmolality: 281 (ref 275–301)
Potassium: 3.5 mmol/L (ref 3.5–5.1)

## 2011-05-18 LAB — APTT: Activated PTT: 87 secs — ABNORMAL HIGH (ref 23.6–35.9)

## 2011-05-18 LAB — PROTIME-INR
INR: 1.1
Prothrombin Time: 14.9 secs — ABNORMAL HIGH (ref 11.5–14.7)

## 2011-05-19 LAB — APTT
Activated PTT: 109 secs — ABNORMAL HIGH (ref 23.6–35.9)
Activated PTT: 27.2 secs (ref 23.6–35.9)

## 2011-05-20 LAB — CBC WITH DIFFERENTIAL/PLATELET
Basophil #: 0 10*3/uL (ref 0.0–0.1)
Eosinophil %: 0 %
HCT: 39.3 % (ref 35.0–47.0)
Lymphocyte #: 0.5 10*3/uL — ABNORMAL LOW (ref 1.0–3.6)
Lymphocyte %: 6.3 %
MCH: 29.1 pg (ref 26.0–34.0)
MCV: 88 fL (ref 80–100)
Monocyte %: 7.6 %
RBC: 4.48 10*6/uL (ref 3.80–5.20)
RDW: 14.2 % (ref 11.5–14.5)
WBC: 7.2 10*3/uL (ref 3.6–11.0)

## 2011-05-20 LAB — BASIC METABOLIC PANEL
Anion Gap: 9 (ref 7–16)
BUN: 11 mg/dL (ref 7–18)
Chloride: 112 mmol/L — ABNORMAL HIGH (ref 98–107)
Creatinine: 1.17 mg/dL (ref 0.60–1.30)
Sodium: 144 mmol/L (ref 136–145)

## 2011-05-21 LAB — CBC WITH DIFFERENTIAL/PLATELET
Eosinophil #: 0 10*3/uL (ref 0.0–0.7)
HCT: 33 % — ABNORMAL LOW (ref 35.0–47.0)
HGB: 11 g/dL — ABNORMAL LOW (ref 12.0–16.0)
MCHC: 33.3 g/dL (ref 32.0–36.0)
MCV: 88 fL (ref 80–100)
Monocyte %: 10.7 %
Neutrophil #: 5.8 10*3/uL (ref 1.4–6.5)
Platelet: 106 10*3/uL — ABNORMAL LOW (ref 150–440)
RBC: 3.76 10*6/uL — ABNORMAL LOW (ref 3.80–5.20)
RDW: 15 % — ABNORMAL HIGH (ref 11.5–14.5)
WBC: 7.6 10*3/uL (ref 3.6–11.0)

## 2011-05-21 LAB — BASIC METABOLIC PANEL
Anion Gap: 7 (ref 7–16)
Calcium, Total: 8 mg/dL — ABNORMAL LOW (ref 8.5–10.1)
Co2: 25 mmol/L (ref 21–32)
Creatinine: 0.78 mg/dL (ref 0.60–1.30)
EGFR (African American): >60
EGFR (Non-African Amer.): >60
Glucose: 104 mg/dL — ABNORMAL HIGH (ref 65–99)
Osmolality: 285 (ref 275–301)

## 2011-05-22 LAB — CBC WITH DIFFERENTIAL/PLATELET
Basophil #: 0.1 10*3/uL (ref 0.0–0.1)
Basophil %: 0.8 %
Eosinophil #: 0.4 10*3/uL (ref 0.0–0.7)
Eosinophil %: 5.6 %
HGB: 10.2 g/dL — ABNORMAL LOW (ref 12.0–16.0)
MCH: 29.3 pg (ref 26.0–34.0)
MCV: 88 fL (ref 80–100)
Monocyte #: 0.6 x10 3/mm (ref 0.2–0.9)
Neutrophil %: 62.9 %
RBC: 3.46 10*6/uL — ABNORMAL LOW (ref 3.80–5.20)
RDW: 14.2 % (ref 11.5–14.5)

## 2011-05-22 LAB — BASIC METABOLIC PANEL
Anion Gap: 9 (ref 7–16)
BUN: 6 mg/dL — ABNORMAL LOW (ref 7–18)
Chloride: 101 mmol/L (ref 98–107)
Creatinine: 0.74 mg/dL (ref 0.60–1.30)
EGFR (Non-African Amer.): >60
Glucose: 94 mg/dL (ref 65–99)
Osmolality: 273 (ref 275–301)
Sodium: 138 mmol/L (ref 136–145)

## 2011-05-23 DIAGNOSIS — R0602 Shortness of breath: Secondary | ICD-10-CM

## 2011-05-23 LAB — CK TOTAL AND CKMB (NOT AT ARMC)
CK, Total: 293 U/L — ABNORMAL HIGH (ref 21–215)
CK, Total: 184 U/L (ref 21–215)
CK-MB: <0.5 ng/mL — ABNORMAL LOW (ref 0.5–3.6)
CK-MB: <0.5 ng/mL — ABNORMAL LOW (ref 0.5–3.6)

## 2011-05-24 LAB — CBC WITH DIFFERENTIAL/PLATELET
Basophil #: 0 10*3/uL (ref 0.0–0.1)
Basophil %: 0.4 %
Eosinophil #: 0.2 10*3/uL (ref 0.0–0.7)
Eosinophil %: 2.2 %
Lymphocyte #: 1.1 10*3/uL (ref 1.0–3.6)
MCH: 29.2 pg (ref 26.0–34.0)
Monocyte #: 0.8 x10 3/mm (ref 0.2–0.9)
Neutrophil #: 7.4 10*3/uL — ABNORMAL HIGH (ref 1.4–6.5)
Neutrophil %: 77.5 %
Platelet: 134 10*3/uL — ABNORMAL LOW (ref 150–440)
RBC: 3.78 10*6/uL — ABNORMAL LOW (ref 3.80–5.20)
WBC: 9.6 10*3/uL (ref 3.6–11.0)

## 2011-05-24 LAB — BASIC METABOLIC PANEL
Anion Gap: 8 (ref 7–16)
Chloride: 100 mmol/L (ref 98–107)
Co2: 32 mmol/L (ref 21–32)
Creatinine: 0.95 mg/dL (ref 0.60–1.30)
EGFR (African American): >60
Osmolality: 279 (ref 275–301)
Potassium: 3.2 mmol/L — ABNORMAL LOW (ref 3.5–5.1)

## 2011-05-24 LAB — CK TOTAL AND CKMB (NOT AT ARMC)
CK, Total: 96 U/L (ref 21–215)
CK-MB: <0.5 ng/mL — ABNORMAL LOW (ref 0.5–3.6)

## 2011-05-24 LAB — PROTIME-INR
INR: 1.1
Prothrombin Time: 14.1 secs (ref 11.5–14.7)

## 2011-05-24 LAB — APTT
Activated PTT: 115.7 secs — ABNORMAL HIGH (ref 23.6–35.9)
Activated PTT: 91.5 secs — ABNORMAL HIGH (ref 23.6–35.9)

## 2011-05-25 LAB — CBC WITH DIFFERENTIAL/PLATELET
Basophil %: 0.3 %
Eosinophil #: 0.2 10*3/uL (ref 0.0–0.7)
Eosinophil %: 1.5 %
HCT: 38.3 % (ref 35.0–47.0)
HGB: 12.6 g/dL (ref 12.0–16.0)
Lymphocyte %: 8 %
MCH: 28.9 pg (ref 26.0–34.0)
MCV: 88 fL (ref 80–100)
Monocyte #: 0.8 x10 3/mm (ref 0.2–0.9)
RBC: 4.37 10*6/uL (ref 3.80–5.20)
WBC: 11.4 10*3/uL — ABNORMAL HIGH (ref 3.6–11.0)

## 2011-05-25 LAB — BASIC METABOLIC PANEL
Anion Gap: 9 (ref 7–16)
Calcium, Total: 8.7 mg/dL (ref 8.5–10.1)
Chloride: 101 mmol/L (ref 98–107)
Creatinine: 1.08 mg/dL (ref 0.60–1.30)
EGFR (African American): >60
Osmolality: 277 (ref 275–301)
Potassium: 3.7 mmol/L (ref 3.5–5.1)
Sodium: 138 mmol/L (ref 136–145)

## 2011-05-25 LAB — APTT: Activated PTT: 71.6 secs — ABNORMAL HIGH (ref 23.6–35.9)

## 2011-05-25 LAB — VANCOMYCIN, TROUGH: Vancomycin, Trough: 20 ug/mL (ref 10–20)

## 2011-05-26 LAB — PROTIME-INR
INR: 1.3
Prothrombin Time: 16.1 secs — ABNORMAL HIGH (ref 11.5–14.7)

## 2011-05-26 LAB — APTT: Activated PTT: 85.3 secs — ABNORMAL HIGH (ref 23.6–35.9)

## 2011-05-27 LAB — HEMOGLOBIN: HGB: 9.7 g/dL — ABNORMAL LOW (ref 12.0–16.0)

## 2011-05-27 LAB — PROTIME-INR
INR: 1.7
Prothrombin Time: 20 secs — ABNORMAL HIGH (ref 11.5–14.7)

## 2011-05-27 LAB — APTT: Activated PTT: 117.7 secs — ABNORMAL HIGH (ref 23.6–35.9)

## 2011-05-27 LAB — PLATELET COUNT: Platelet: 194 10*3/uL (ref 150–440)

## 2011-05-28 LAB — PATHOLOGY REPORT

## 2011-05-28 LAB — CREATININE, SERUM
Creatinine: 2.93 mg/dL — ABNORMAL HIGH (ref 0.60–1.30)
EGFR (African American): 19 — ABNORMAL LOW
EGFR (Non-African Amer.): 16 — ABNORMAL LOW

## 2011-05-28 LAB — PROTIME-INR: INR: 1.9

## 2011-05-28 LAB — VANCOMYCIN, TROUGH: Vancomycin, Trough: 19 ug/mL (ref 10–20)

## 2011-05-29 LAB — BASIC METABOLIC PANEL
Anion Gap: 10 (ref 7–16)
BUN: 14 mg/dL (ref 7–18)
Calcium, Total: 7.9 mg/dL — ABNORMAL LOW (ref 8.5–10.1)
Chloride: 109 mmol/L — ABNORMAL HIGH (ref 98–107)
Co2: 23 mmol/L (ref 21–32)
EGFR (African American): 22 — ABNORMAL LOW
EGFR (Non-African Amer.): 19 — ABNORMAL LOW
Glucose: 92 mg/dL (ref 65–99)
Osmolality: 283 (ref 275–301)
Potassium: 3.3 mmol/L — ABNORMAL LOW (ref 3.5–5.1)
Sodium: 142 mmol/L (ref 136–145)

## 2011-05-29 LAB — CBC WITH DIFFERENTIAL/PLATELET
Basophil %: 0.6 %
Eosinophil #: 0.6 10*3/uL (ref 0.0–0.7)
Eosinophil %: 6.7 %
HGB: 8.5 g/dL — ABNORMAL LOW (ref 12.0–16.0)
Lymphocyte #: 1.4 10*3/uL (ref 1.0–3.6)
Lymphocyte %: 14.5 %
MCH: 28.8 pg (ref 26.0–34.0)
MCHC: 33.2 g/dL (ref 32.0–36.0)
MCV: 87 fL (ref 80–100)
Neutrophil %: 71.3 %
Platelet: 196 10*3/uL (ref 150–440)
RBC: 2.95 10*6/uL — ABNORMAL LOW (ref 3.80–5.20)

## 2011-05-29 LAB — PROTIME-INR: Prothrombin Time: 23.3 secs — ABNORMAL HIGH (ref 11.5–14.7)

## 2011-05-30 LAB — BASIC METABOLIC PANEL
Calcium, Total: 7.9 mg/dL — ABNORMAL LOW (ref 8.5–10.1)
Chloride: 110 mmol/L — ABNORMAL HIGH (ref 98–107)
Creatinine: 2.46 mg/dL — ABNORMAL HIGH (ref 0.60–1.30)
Glucose: 96 mg/dL (ref 65–99)
Osmolality: 285 (ref 275–301)
Potassium: 3.9 mmol/L (ref 3.5–5.1)
Sodium: 143 mmol/L (ref 136–145)

## 2011-05-30 LAB — PROTIME-INR
INR: 1.9
Prothrombin Time: 22 secs — ABNORMAL HIGH (ref 11.5–14.7)

## 2011-06-03 ENCOUNTER — Other Ambulatory Visit: Payer: 59

## 2011-06-05 ENCOUNTER — Telehealth: Payer: Self-pay | Admitting: Internal Medicine

## 2011-06-05 NOTE — Telephone Encounter (Signed)
Patient had PT/INR done on Monday at [facility] and would like her results.

## 2011-06-06 ENCOUNTER — Telehealth: Payer: Self-pay | Admitting: Internal Medicine

## 2011-06-06 NOTE — Telephone Encounter (Signed)
We have received the lab results, and are waiting on Dr. Darrick Huntsman to respond.

## 2011-06-06 NOTE — Telephone Encounter (Signed)
I have spoke with [name] again to let her know that we did get the result and that me and Mr. Cheek was working on why her results are taking so long to get to [place].  Patient was also informed that Dr. Darrick Huntsman had just finished with morning clinic and that she has results on her desk and she should hear by the end of the day.

## 2011-06-06 NOTE — Telephone Encounter (Signed)
Patient was giving results and she understands that we are still trying to work with [employer] [facility] on this.  She stated that since it is theapuetic she will keep taking the dosage of 6 and repeat test in one month.  If that isn't correct please route instructions to Morrie Sheldon and patient knows we will call her Monday if she needs to change her dosage.  I also spoke with her about the suggestion of her coming into the office to get protime done, she wanted to know if Loraine Leriche had been back in touch with me and I advised patient that I was still waiting to hear back from him. I apologized for it being so late to get the results.

## 2011-06-06 NOTE — Telephone Encounter (Signed)
I called [facility] at [phone] and spoke with Angelica Ran, she stated that the results were faxed over on 06/03/11, but she would refax them for me.  After I spoke with Cindee Lame I then  called Lorel Monaco our representative at [facility]. He has faxed over the results for me, he also stated that we should choose the account number [id number] for the results to show up in [facility], he states that right now they are set up as a fax report.  I went and spoke with Morrie Sheldon C. About choosing the correct Account number and she advised me that she doesn't choose an account number. I have spoke again with Loraine Leriche and he is going to see if he can find out an answer for [place] on why this isn't resulted to our system.

## 2011-06-06 NOTE — Telephone Encounter (Signed)
Her protime is therapuetic,  INR is 2.4.  If [facility] can't figure out why she is the only patient we are having an issue with, then I recommend that the patient start coming here for her protime or start calling us the day she gets her protime done so so we will know to expect itwithin 24 hours

## 2011-06-06 NOTE — Telephone Encounter (Signed)
Patient called in this morning asking to speak with the office manager.  I advised her that I was the office supervisor.  She stated that she was calling in for her lab results that she had done on 06/03/11 for her PT/INR.  I advised patient of the previous phone note 06/05/11 and that Carlyon Shadow CMA, requested from [facility] the result to be faxed to [place].  I placed caller on hold to see if we had received the fax, I looked and advised patient that it had still not came over. I advised Mrs. [name] that I would contact our [employer] and see what the hold up was.  She agreed that I could call her back and she said " I expect a phone call back within the hour".

## 2011-06-06 NOTE — Telephone Encounter (Signed)
We have not received labs.  I called and requested labs from [facility] and they are going to fax it over.

## 2011-06-13 ENCOUNTER — Ambulatory Visit: Payer: Self-pay | Admitting: Surgery

## 2011-06-16 ENCOUNTER — Encounter: Payer: Self-pay | Admitting: Internal Medicine

## 2011-06-30 ENCOUNTER — Telehealth: Payer: Self-pay | Admitting: Internal Medicine

## 2011-06-30 NOTE — Telephone Encounter (Signed)
Left message asking patient to call back

## 2011-06-30 NOTE — Telephone Encounter (Signed)
Pt called to schedule her hospital follow  Made appointment for 07/18/11 Pt was discharged from [facility] 05/30/11

## 2011-07-01 NOTE — Telephone Encounter (Signed)
Patient returned call and stated that she is doing well and will see Dr. Darrick Huntsman at her follow up on 07-18-11.

## 2011-07-04 ENCOUNTER — Other Ambulatory Visit (INDEPENDENT_AMBULATORY_CARE_PROVIDER_SITE_OTHER): Payer: 59 | Admitting: *Deleted

## 2011-07-04 DIAGNOSIS — Z7901 Long term (current) use of anticoagulants: Secondary | ICD-10-CM

## 2011-07-04 LAB — PROTIME-INR: INR: 2.5 ratio — ABNORMAL HIGH (ref 0.8–1.0)

## 2011-07-18 ENCOUNTER — Encounter: Payer: Self-pay | Admitting: Internal Medicine

## 2011-07-18 ENCOUNTER — Ambulatory Visit (INDEPENDENT_AMBULATORY_CARE_PROVIDER_SITE_OTHER): Payer: 59 | Admitting: Internal Medicine

## 2011-07-18 VITALS — BP 136/76 | HR 104 | Temp 98.3°F | Resp 16 | Wt 149.5 lb

## 2011-07-18 DIAGNOSIS — Z86711 Personal history of pulmonary embolism: Secondary | ICD-10-CM

## 2011-07-18 DIAGNOSIS — E039 Hypothyroidism, unspecified: Secondary | ICD-10-CM

## 2011-07-18 DIAGNOSIS — K551 Chronic vascular disorders of intestine: Secondary | ICD-10-CM

## 2011-07-18 DIAGNOSIS — Z7901 Long term (current) use of anticoagulants: Secondary | ICD-10-CM

## 2011-07-18 DIAGNOSIS — N179 Acute kidney failure, unspecified: Secondary | ICD-10-CM

## 2011-07-18 DIAGNOSIS — D62 Acute posthemorrhagic anemia: Secondary | ICD-10-CM

## 2011-07-18 DIAGNOSIS — D6859 Other primary thrombophilia: Secondary | ICD-10-CM

## 2011-07-18 LAB — TSH: TSH: 1.57 u[IU]/mL (ref 0.35–5.50)

## 2011-07-18 MED ORDER — METOPROLOL SUCCINATE ER 25 MG PO TB24: 25.0000 mg | ORAL_TABLET | Freq: Every day | ORAL | Status: DC

## 2011-07-18 NOTE — Progress Notes (Signed)
Patient ID: [name], female   DOB: [birth year], 60 y.o.   MRN: [id number]  Patient Active Problem List  Diagnosis  . Hypertension  . H/O toxic shock syndrome  . History of pulmonary embolus (PE)  . Chronic ischemic colitis, enteritis, or enterocolitis  . Anemia due to acute blood loss  . Protein C deficiency  . Encounter for long-term (current) use of anticoagulants    Subjective:  CC:   Chief Complaint  Patient presents with  . Follow-up    hospital    HPI:   Emma Biever Handyis a 61 y.o. female who presents for hospital followup. [name] was hospitalized at [facility] regional from a May 7 through May 24 for recurrent ischemic colitis accompanied by hematochezia. She ultimately underwent a left colectomy due to  failure to improve with conservative therapy and continued need for anticoagulation due to history of pulmonary embolism and protein C deficiency. She had an IVC filter placed by Dr. Gilda Crease and was noted to have an IVC thrombus at the time of placement  despite a clearance to warfarin therapy.. Because of recurrent bleeding she did develop anemia and thrombocytopenia postoperatively but did not require transfusions. She did develop an episode of respiratory depression secondary to narcotic use for pain control, and was transiently managed in the ICU but was not intubated she responded to Narcan.  Currently she is feeling well and recovering slowly from her ordeal.  She lost 22 pounds during the 3 weeks and has gained back 7 so far . Her chronic nausea and poor appetite are slowly improving. She continues to use Zofran when necessary. She continues to follow a bland diet.  She does have some dyspnea with exertion but no chest pain. She has not returned to work yet. She continues to have some fatigue requiring a early afternoon nap of one hour.  She is scheduled to return to August 5 would like to postpone returning until she has an IVC filter removed the following week. have her IVC  filter removed in 2 weeks and would like to remain out of work until the filter is removed rather than return and then take additional days off.   Past Medical History  Diagnosis Date  . Hypertension   . Hyperlipidemia   . H/O toxic shock syndrome 2001    post urologic procedure for stone removal  . 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 Ischemic colitis 2012-2013    s/p left colectomy May 2013    Past Surgical History  Procedure Date  . Spine surgery 1984    lumbar diskectomy, [place]  . Breast surgery 1989    left biopsy, normal  . Abdominal hysterectomy 1996  . Colectomy May 2013    parital/left, no colostomy  . Appendectomy   . Bilateral tubal ligation   . Ureterolithotomy     The following portions of the patient's history were reviewed and updated as appropriate: Allergies, current medications, and problem list.    Review of Systems:  Comprehensive   review of systems was negative except those addressed in the HPI,     History   Social History  . Marital Status: Married    Spouse Name: N/A    Number of Children: N/A  . Years of Education: N/A   Occupational History  . Not on file.   Social History Main Topics  . Smoking status: Never Smoker   .  Smokeless tobacco: Never Used  . Alcohol Use: No  . Drug Use: No  . Sexually Active: Not on file   Other Topics Concern  . Not on file   Social History Narrative  . No narrative on file    Objective:  BP 136/76  Pulse 104  Temp 98.3 F (36.8 C) (Oral)  Resp 16  Wt 149 lb 8 oz (67.813 kg)  SpO2 96%  General appearance: alert, cooperative and appears stated age Ears: normal TM's and external ear canals both ears Throat: lips, mucosa, and tongue normal; teeth and gums normal Neck: no adenopathy, no carotid bruit, supple, symmetrical, trachea midline and thyroid not enlarged, symmetric, no tenderness/mass/nodules Back: symmetric, no  curvature. ROM normal. No CVA tenderness. Lungs: clear to auscultation bilaterally Heart: regular rate and rhythm, S1, S2 normal, no murmur, click, rub or gallop Abdomen: soft, non-tender; bowel sounds normal; no masses,  no organomegaly Pulses: 2+ and symmetric Skin: Skin color, texture, turgor normal. No rashes or lesions Lymph nodes: Cervical, supraclavicular, and axillary nodes normal.  Assessment and Plan:  Chronic ischemic colitis, enteritis, or enterocolitis Over 4 admissions for abdominal pain nausea and vomiting accompanied by hematochezia intermittently over the last 2 years. She underwent definitive therapy his last admission with a left colectomy by Dr. Michela Pitcher. She did not require a diverting colostomy. She has been asymptomatic since her surgery. She has had postsurgical followup. She will have her IVC filter removed in several weeks. She is using Coumadin therapy.  Anemia due to acute blood loss Her admission hgb was 14.2 on May 1 and trended down to 8.5 as of May 23 per review of hospital records.. She did not receive transfusions; platelets at that time. Were 196K because of her continued nausea I will include B12 and folate studies along with the repeat CBC and iron studies  And initiatie appropriate therapy as indicated.  History of pulmonary embolus (PE) Occurred in 2011, Secondary to protein C. and protein S deficiency. She had an IVC filter placed preoperatively and has resumed Coumadin therapy. She will return at the appropriate time for INR check. He denies any current symptoms of pleuritic chest pain and is saturation well on room air. She has no signs of pulmonary hypertension by recent echocardiogram.  Encounter for long-term (current) use of anticoagulants Timely evaluation of serial PT INR values have been problematic with patient obtaining lab draws and lab core area she has agreed to obtain lab draws at our office to facilitate sleep and timely adjustment of her  warfarin therapy.   Updated Medication List Outpatient Encounter Prescriptions as of 07/18/2011  Medication Sig Dispense Refill  . ALPRAZolam (XANAX) 0.25 MG tablet Take 1 tablet (0.25 mg total) by mouth 3 (three) times daily as needed for anxiety.  270 tablet  3  . aspirin 81 MG tablet Take 81 mg by mouth daily.      . butalbital-acetaminophen-caffeine (FIORICET, ESGIC) 50-325-40 MG per tablet Take 1 tablet by mouth as needed for headache.  90 tablet  3  . Cholecalciferol (VITAMIN D3) 1000 UNITS CAPS Take by mouth.      Marland Kitchen imipramine (TOFRANIL) 25 MG tablet Take 1 tablet (25 mg total) by mouth at bedtime.  90 tablet  3  . metoprolol succinate (TOPROL-XL) 25 MG 24 hr tablet Take 1 tablet (25 mg total) by mouth daily.  30 tablet  3  . omeprazole (PRILOSEC) 20 MG capsule Take 20 mg by mouth daily.      Marland Kitchen  simvastatin (ZOCOR) 40 MG tablet Take 1 tablet (40 mg total) by mouth at bedtime.  90 tablet  3  . warfarin (COUMADIN) 6 MG tablet Take 1 tablet (6 mg total) by mouth daily.  30 tablet  6  . DISCONTD: metoprolol succinate (TOPROL-XL) 25 MG 24 hr tablet Take 1 tablet (25 mg total) by mouth daily.  30 tablet  3  . DISCONTD: saccharomyces boulardii (FLORASTOR) 250 MG capsule Take 250 mg by mouth daily.         Orders Placed This Encounter  Procedures  . TSH  . COMPLETE METABOLIC PANEL WITH GFR  . INR/PT    No Follow-up on file.

## 2011-07-19 LAB — COMPLETE METABOLIC PANEL WITH GFR
ALT: 14 U/L (ref 0–35)
AST: 20 U/L (ref 0–37)
CO2: 27 mEq/L (ref 19–32)
Calcium: 9 mg/dL (ref 8.4–10.5)
Chloride: 106 mEq/L (ref 96–112)
Creat: 1.01 mg/dL (ref 0.50–1.10)
GFR, Est African American: 69 mL/min
Potassium: 4.1 mEq/L (ref 3.5–5.3)
Sodium: 141 mEq/L (ref 135–145)
Total Protein: 6.5 g/dL (ref 6.0–8.3)

## 2011-07-20 ENCOUNTER — Encounter: Payer: Self-pay | Admitting: Internal Medicine

## 2011-07-20 DIAGNOSIS — Z9229 Personal history of other drug therapy: Secondary | ICD-10-CM | POA: Insufficient documentation

## 2011-07-20 DIAGNOSIS — Z7901 Long term (current) use of anticoagulants: Secondary | ICD-10-CM | POA: Insufficient documentation

## 2011-07-20 DIAGNOSIS — D62 Acute posthemorrhagic anemia: Secondary | ICD-10-CM | POA: Insufficient documentation

## 2011-07-20 DIAGNOSIS — K559 Vascular disorder of intestine, unspecified: Secondary | ICD-10-CM | POA: Insufficient documentation

## 2011-07-20 DIAGNOSIS — D6859 Other primary thrombophilia: Secondary | ICD-10-CM | POA: Insufficient documentation

## 2011-07-20 NOTE — Assessment & Plan Note (Addendum)
Her admission hgb was 14.2 on May 1 and trended down to 8.5 as of May 23 per review of hospital records.. She did not receive transfusions; platelets at that time. Were 196K because of her continued nausea I will include B12 and folate studies along with the repeat CBC and iron studies  And initiatie appropriate therapy as indicated.

## 2011-07-20 NOTE — Assessment & Plan Note (Signed)
Over 4 admissions for abdominal pain nausea and vomiting accompanied by hematochezia intermittently over the last 2 years. She underwent definitive therapy his last admission with a left colectomy by Dr. Michela Pitcher. She did not require a diverting colostomy. She has been asymptomatic since her surgery. She has had postsurgical followup. She will have her IVC filter removed in several weeks. She is using Coumadin therapy.

## 2011-07-20 NOTE — Assessment & Plan Note (Signed)
Timely evaluation of serial PT INR values have been problematic with patient obtaining lab draws and lab core area she has agreed to obtain lab draws at our office to facilitate sleep and timely adjustment of her warfarin therapy.

## 2011-07-20 NOTE — Assessment & Plan Note (Signed)
Occurred in 2011, Secondary to protein C. and protein S deficiency. She had an IVC filter placed preoperatively and has resumed Coumadin therapy. She will return at the appropriate time for INR check. He denies any current symptoms of pleuritic chest pain and is saturation well on room air. She has no signs of pulmonary hypertension by recent echocardiogram.

## 2011-07-24 ENCOUNTER — Encounter: Payer: Self-pay | Admitting: Internal Medicine

## 2011-08-04 ENCOUNTER — Other Ambulatory Visit (INDEPENDENT_AMBULATORY_CARE_PROVIDER_SITE_OTHER): Payer: 59 | Admitting: *Deleted

## 2011-08-04 ENCOUNTER — Ambulatory Visit: Payer: Self-pay | Admitting: Internal Medicine

## 2011-08-04 ENCOUNTER — Other Ambulatory Visit: Payer: Self-pay | Admitting: Internal Medicine

## 2011-08-04 DIAGNOSIS — D62 Acute posthemorrhagic anemia: Secondary | ICD-10-CM

## 2011-08-04 DIAGNOSIS — Z7901 Long term (current) use of anticoagulants: Secondary | ICD-10-CM

## 2011-08-04 DIAGNOSIS — E785 Hyperlipidemia, unspecified: Secondary | ICD-10-CM

## 2011-08-04 DIAGNOSIS — R5381 Other malaise: Secondary | ICD-10-CM

## 2011-08-04 DIAGNOSIS — R5383 Other fatigue: Secondary | ICD-10-CM

## 2011-08-04 DIAGNOSIS — Z79899 Other long term (current) drug therapy: Secondary | ICD-10-CM

## 2011-08-04 LAB — FOLATE: Folate: 12.4 ng/mL (ref >5.9)

## 2011-08-04 LAB — HEPATIC FUNCTION PANEL
ALT: 18 U/L (ref 0–35)
AST: 20 U/L (ref 0–37)
Alkaline Phosphatase: 65 U/L (ref 39–117)
Total Bilirubin: 0.5 mg/dL (ref 0.3–1.2)
Total Protein: 6.4 g/dL (ref 6.0–8.3)

## 2011-08-04 LAB — CBC WITH DIFFERENTIAL/PLATELET
Basophils Absolute: 0 10*3/uL (ref 0.0–0.1)
Eosinophils Absolute: 0.3 10*3/uL (ref 0.0–0.7)
HCT: 34.8 % — ABNORMAL LOW (ref 36.0–46.0)
Hemoglobin: 11.5 g/dL — ABNORMAL LOW (ref 12.0–15.0)
Lymphocytes Relative: 50.8 % — ABNORMAL HIGH (ref 12.0–46.0)
Lymphs Abs: 2.4 10*3/uL (ref 0.7–4.0)
MCHC: 33.1 g/dL (ref 30.0–36.0)
MCV: 85.3 fl (ref 78.0–100.0)
Monocytes Absolute: 0.4 10*3/uL (ref 0.1–1.0)
Neutro Abs: 1.7 10*3/uL (ref 1.4–7.7)
RDW: 15.9 % — ABNORMAL HIGH (ref 11.5–14.6)

## 2011-08-04 LAB — LIPID PANEL
HDL: 47 mg/dL (ref >39.00)
LDL Cholesterol: 95 mg/dL (ref 0–99)
Total CHOL/HDL Ratio: 4
Triglycerides: 139 mg/dL (ref 0.0–149.0)

## 2011-08-04 LAB — BASIC METABOLIC PANEL
BUN: 17 mg/dL (ref 6–23)
Calcium: 9.1 mg/dL (ref 8.4–10.5)
Creatinine, Ser: 1 mg/dL (ref 0.4–1.2)
GFR: 57.65 mL/min — ABNORMAL LOW (ref >60.00)
Glucose, Bld: 93 mg/dL (ref 70–99)

## 2011-08-04 LAB — TSH: TSH: 3.52 u[IU]/mL (ref 0.35–5.50)

## 2011-08-04 LAB — VITAMIN B12: Vitamin B-12: 255 pg/mL (ref 211–911)

## 2011-08-04 NOTE — Addendum Note (Signed)
Addended by: Jobie Quaker on: 08/04/2011 11:44 AM   Modules accepted: Orders

## 2011-08-05 ENCOUNTER — Other Ambulatory Visit: Payer: Self-pay | Admitting: Internal Medicine

## 2011-08-05 MED ORDER — METOPROLOL SUCCINATE ER 25 MG PO TB24: 25.0000 mg | ORAL_TABLET | Freq: Every day | ORAL | Status: DC

## 2011-08-12 ENCOUNTER — Encounter: Payer: Self-pay | Admitting: Internal Medicine

## 2011-08-12 ENCOUNTER — Ambulatory Visit: Payer: Self-pay | Admitting: Vascular Surgery

## 2011-08-12 LAB — PROTIME-INR: Prothrombin Time: 21.8 secs — ABNORMAL HIGH (ref 11.5–14.7)

## 2011-08-18 ENCOUNTER — Telehealth: Payer: Self-pay | Admitting: Internal Medicine

## 2011-08-18 ENCOUNTER — Encounter: Payer: Self-pay | Admitting: Internal Medicine

## 2011-08-18 NOTE — Telephone Encounter (Signed)
Patient needs a note faxed to her supervisor Andria Meuse, [phone] stating that it is okay for her to be back at work today.  She states that Dr. Darrick Huntsman took her out until today.  She needs this as soon as possible since her electronics have been turned off at work until they get the letter.

## 2011-08-18 NOTE — Telephone Encounter (Signed)
Letter printed, waiting for signature then I will fax.

## 2011-08-18 NOTE — Telephone Encounter (Signed)
Letter signed and faxed . Patient notified.

## 2011-09-04 ENCOUNTER — Other Ambulatory Visit (INDEPENDENT_AMBULATORY_CARE_PROVIDER_SITE_OTHER): Payer: 59 | Admitting: *Deleted

## 2011-09-04 DIAGNOSIS — Z7901 Long term (current) use of anticoagulants: Secondary | ICD-10-CM

## 2011-09-04 NOTE — Addendum Note (Signed)
Addended by: Jobie Quaker on: 09/04/2011 09:45 AM   Modules accepted: Orders

## 2011-09-05 LAB — PROTIME-INR
INR: 2.62 — ABNORMAL HIGH (ref <1.50)
Prothrombin Time: 28.9 seconds — ABNORMAL HIGH (ref 11.6–15.2)

## 2011-10-01 ENCOUNTER — Telehealth: Payer: Self-pay | Admitting: Internal Medicine

## 2011-10-01 MED ORDER — FUROSEMIDE 20 MG PO TABS: 20.0000 mg | ORAL_TABLET | Freq: Every day | ORAL | Status: DC

## 2011-10-01 NOTE — Telephone Encounter (Signed)
Caller: Lamisha/Patient; Patient Name: [name]; PCP: Duncan Dull (Adults only); Best Callback Phone Number: [phone]; Reason for call: HTN.  Pt reports a medication change in July and diuretic has been removed.  Pt reports swelling in feet and ankles.  Pt also reports increase in BP (164/108).  PT reports a headache, severe on 09/30/11.  Pain caused vomiting.  Pt reports mild headache present today.  Triaged patient per Hypertension, Diagnosed or Suspected.  See Provider within 4 hours Disposition for 'Diagnosed hypertension, has had a sudden elevation of blood pressure and new swelling around eye; smoky, cola-colred urine or swelling of extremities'.  RN tried to make appt within time frame but no appt seen.  OFFICE PLEASE FOLLOW UP WITH APPT NEED FOR PT.

## 2011-10-01 NOTE — Telephone Encounter (Signed)
Please have patient check her pulse and it is greater than and 75 she can increase her metoprolol to 50 mg daily. Please also have her resume her diuretic, furosemide  20 mg one tablet daily  #30. One refill.  patient  does have an appointment for Friday. If her headache does not improve or her vomiting becomes a recurrent  problem please instructher to go to the ER

## 2011-10-01 NOTE — Telephone Encounter (Signed)
Left detailed message on patients cell phone and asked her to call me back in the morning to make sure she received my message.

## 2011-10-03 ENCOUNTER — Encounter: Payer: Self-pay | Admitting: Internal Medicine

## 2011-10-03 ENCOUNTER — Ambulatory Visit (INDEPENDENT_AMBULATORY_CARE_PROVIDER_SITE_OTHER): Payer: 59 | Admitting: Internal Medicine

## 2011-10-03 ENCOUNTER — Other Ambulatory Visit (INDEPENDENT_AMBULATORY_CARE_PROVIDER_SITE_OTHER): Payer: 59

## 2011-10-03 VITALS — BP 110/74 | HR 92 | Temp 97.9°F | Resp 16 | Wt 159.8 lb

## 2011-10-03 DIAGNOSIS — Z7901 Long term (current) use of anticoagulants: Secondary | ICD-10-CM

## 2011-10-03 DIAGNOSIS — D62 Acute posthemorrhagic anemia: Secondary | ICD-10-CM

## 2011-10-03 DIAGNOSIS — I10 Essential (primary) hypertension: Secondary | ICD-10-CM

## 2011-10-03 NOTE — Progress Notes (Signed)
Patient ID: [name], female   DOB: June 24, 1950, 61 y.o.   MRN: [id number]  Patient Active Problem List  Diagnosis  . Hypertension  . H/O toxic shock syndrome  . History of pulmonary embolus (PE)  . Chronic ischemic colitis, enteritis, or enterocolitis  . Anemia due to acute blood loss  . Protein C deficiency  . Encounter for long-term (current) use of anticoagulants    Subjective:  CC:   Chief Complaint  Patient presents with  . Hypertension    HPI:   [name] a 61 y.o. female who presents for Followup for recent episodes of persistent blood pressure elevation accompanied by parietal lobe and frontal lobe headaches. Patient brings a diary of her blood pressures the last week and exception of today her blood pressures have been elevated with systolics greater than 150 and diastolics is elevated at 100. Her recent headache was resolved with Fioricet. She does note that she had been using Aleve during a period where L. pressure was elevated. She does note that the blood pressure elevations did precede the headaches. Recent changes in her blood pressure medications include discontinuation of furosemide during last hospitalization at [facility] due to an elevation if cr. She has noticed some fluid retention in her feet and ankles since stopping it .  She has not had her blood pressure medications this morning.   Past Medical History  Diagnosis Date  . Hypertension   . Hyperlipidemia   . H/O toxic shock syndrome 2001    post urologic procedure for stone removal  . 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 Ischemic colitis 2012-2013    s/p left colectomy May 2013    Past Surgical History  Procedure Date  . Spine surgery 1984    lumbar diskectomy, [place]  . Breast surgery 1989    left biopsy, normal  . Abdominal hysterectomy 1996  . Colectomy May 2013    parital/left, no colostomy  . Appendectomy   . Bilateral tubal  ligation   . Ureterolithotomy          The following portions of the patient's history were reviewed and updated as appropriate: Allergies, current medications, and problem list.    Review of Systems:   12 Pt  review of systems was negative except those addressed in the HPI,     History   Social History  . Marital Status: Married    Spouse Name: N/A    Number of Children: N/A  . Years of Education: N/A   Occupational History  . Not on file.   Social History Main Topics  . Smoking status: Never Smoker   . Smokeless tobacco: Never Used  . Alcohol Use: No  . Drug Use: No  . Sexually Active: Not on file   Other Topics Concern  . Not on file   Social History Narrative  . No narrative on file    Objective:  BP 110/74  Pulse 92  Temp 97.9 F (36.6 C) (Oral)  Resp 16  Wt 159 lb 12 oz (72.462 kg)  SpO2 95%  General appearance: alert, cooperative and appears stated age Ears: normal TM's and external ear canals both ears Throat: lips, mucosa, and tongue normal; teeth and gums normal Neck: no adenopathy, no carotid bruit, supple, symmetrical, trachea midline and thyroid not enlarged, symmetric, no tenderness/mass/nodules Back: symmetric, no curvature. ROM normal. No CVA tenderness. Lungs: clear to auscultation bilaterally  Heart: regular rate and rhythm, S1, S2 normal, no murmur, click, rub or gallop Abdomen: soft, non-tender; bowel sounds normal; no masses,  no organomegaly Pulses: 2+ and symmetric Skin: Skin color, texture, turgor normal. No rashes or lesions Lymph nodes: Cervical, supraclavicular, and axillary nodes normal.  Assessment and Plan:  Hypertension Her recent elevations in blood pressure may have been due to use of Aleve. I suggested that she suspend any use of NSAIDs and use Urised, Tylenol or tramadol as needed for pain. She will continue to check her blood pressures and increased the Toprol to 50 mg only for systolics elevated above 150 and  pulse is greater than 70.  Encounter for long-term (current) use of anticoagulants She is on chronic lifelong Coumadin for history of PE secondary to protein C deficiency. She is due for repeat protime.  Anemia due to acute blood loss Her last hemoglobin was 11.2 over one month ago  in the setting of continued GI blood loss is due to colitis. Repeat CBC has been ordered.   Updated Medication List Outpatient Encounter Prescriptions as of 10/03/2011  Medication Sig Dispense Refill  . ALPRAZolam (XANAX) 0.25 MG tablet Take 1 tablet (0.25 mg total) by mouth 3 (three) times daily as needed for anxiety.  270 tablet  3  . aspirin 81 MG tablet Take 81 mg by mouth daily.      . butalbital-acetaminophen-caffeine (FIORICET, ESGIC) 50-325-40 MG per tablet Take 1 tablet by mouth as needed for headache.  90 tablet  3  . Cholecalciferol (VITAMIN D3) 1000 UNITS CAPS Take by mouth.      . furosemide (LASIX) 20 MG tablet Take 1 tablet (20 mg total) by mouth daily.  30 tablet  1  . imipramine (TOFRANIL) 25 MG tablet Take 1 tablet (25 mg total) by mouth at bedtime.  90 tablet  3  . metoprolol succinate (TOPROL-XL) 25 MG 24 hr tablet Take 1 tablet (25 mg total) by mouth daily.  90 tablet  3  . omeprazole (PRILOSEC) 20 MG capsule Take 20 mg by mouth daily.      . simvastatin (ZOCOR) 40 MG tablet Take 1 tablet (40 mg total) by mouth at bedtime.  90 tablet  3  . warfarin (COUMADIN) 6 MG tablet Take 1 tablet (6 mg total) by mouth daily.  30 tablet  6

## 2011-10-03 NOTE — Patient Instructions (Signed)
Use the furosemide as needed for fluid retention .  Also consider knee high compression stockings ([email or web address]) mid level 10-20 mm Hg  Take an extra Toprol if needed for elevation of bp > 150 .    Avoid alleve and motrin if you notice a temporal relationship with hypertensive epidoses.   Tylenol ok to use

## 2011-10-05 ENCOUNTER — Encounter: Payer: Self-pay | Admitting: Internal Medicine

## 2011-10-05 NOTE — Assessment & Plan Note (Signed)
She is on chronic lifelong Coumadin for history of PE secondary to protein C deficiency. She is due for repeat protime.

## 2011-10-05 NOTE — Assessment & Plan Note (Signed)
Her last hemoglobin was 11.2 over one month ago  in the setting of continued GI blood loss is due to colitis. Repeat CBC has been ordered.

## 2011-10-05 NOTE — Assessment & Plan Note (Signed)
Her recent elevations in blood pressure may have been due to use of Aleve. I suggested that she suspend any use of NSAIDs and use Urised, Tylenol or tramadol as needed for pain. She will continue to check her blood pressures and increased the Toprol to 50 mg only for systolics elevated above 150 and pulse is greater than 70.

## 2011-10-07 ENCOUNTER — Telehealth: Payer: Self-pay | Admitting: Internal Medicine

## 2011-10-07 ENCOUNTER — Encounter: Payer: Self-pay | Admitting: Internal Medicine

## 2011-10-07 MED ORDER — WARFARIN SODIUM 5 MG PO TABS: ORAL_TABLET | ORAL | Status: DC

## 2011-10-16 ENCOUNTER — Encounter: Payer: Self-pay | Admitting: Internal Medicine

## 2011-10-24 ENCOUNTER — Other Ambulatory Visit (INDEPENDENT_AMBULATORY_CARE_PROVIDER_SITE_OTHER): Payer: 59

## 2011-10-24 DIAGNOSIS — Z7901 Long term (current) use of anticoagulants: Secondary | ICD-10-CM

## 2011-10-24 LAB — PROTIME-INR: INR: 2.3 ratio — ABNORMAL HIGH (ref 0.8–1.0)

## 2011-10-26 ENCOUNTER — Encounter: Payer: Self-pay | Admitting: Internal Medicine

## 2011-10-27 ENCOUNTER — Encounter: Payer: Self-pay | Admitting: Internal Medicine

## 2011-10-30 ENCOUNTER — Telehealth: Payer: Self-pay

## 2011-10-30 MED ORDER — HYDROCHLOROTHIAZIDE 25 MG PO TABS: 12.5000 mg | ORAL_TABLET | Freq: Every day | ORAL | Status: DC

## 2011-10-30 NOTE — Telephone Encounter (Signed)
Patient called today inquiring about Rx HCTZ 25. She stated that you told her you was going to call it in and she was wanting to pick it up.

## 2011-11-24 ENCOUNTER — Other Ambulatory Visit (INDEPENDENT_AMBULATORY_CARE_PROVIDER_SITE_OTHER): Payer: 59

## 2011-11-24 DIAGNOSIS — Z7901 Long term (current) use of anticoagulants: Secondary | ICD-10-CM

## 2011-11-24 LAB — PROTIME-INR: Prothrombin Time: 32.5 s — ABNORMAL HIGH (ref 10.2–12.4)

## 2011-12-10 ENCOUNTER — Other Ambulatory Visit (INDEPENDENT_AMBULATORY_CARE_PROVIDER_SITE_OTHER): Payer: 59

## 2011-12-10 DIAGNOSIS — Z7901 Long term (current) use of anticoagulants: Secondary | ICD-10-CM

## 2011-12-10 LAB — PROTIME-INR
INR: 1.8 ratio — ABNORMAL HIGH (ref 0.8–1.0)
Prothrombin Time: 18.4 s — ABNORMAL HIGH (ref 10.2–12.4)

## 2011-12-14 ENCOUNTER — Encounter: Payer: Self-pay | Admitting: Internal Medicine

## 2011-12-17 ENCOUNTER — Telehealth: Payer: Self-pay | Admitting: Internal Medicine

## 2011-12-17 NOTE — Telephone Encounter (Signed)
Pt called to get pt/inr results from last weeks labs Please advise pt

## 2011-12-18 ENCOUNTER — Telehealth: Payer: Self-pay

## 2011-12-18 NOTE — Telephone Encounter (Signed)
Please have pt increase and take 6mg  on Tue, Thur, Sun and then 5mg  other days. She needs to repeat INR next week.

## 2011-12-18 NOTE — Telephone Encounter (Signed)
Spoke to patient gave her instructions as directed by DR.Walker.

## 2011-12-18 NOTE — Telephone Encounter (Signed)
Patient stated that she take 6 mg on Sundays and 5 mg on the other days . Her INR is 1.8. She wants to know what her new regime will be because she needs to go out of town dis weekend.

## 2011-12-18 NOTE — Telephone Encounter (Signed)
Spoke to patient gave her lab results. She stated that she takes 6 mg on Sundays and 5 mg on all the other days. She stated  That her BP is running around 147/88, she wants to know if she can take a whole pill instead of half pill due to swelling.

## 2011-12-19 NOTE — Telephone Encounter (Signed)
Increased coumadin dose to 6 mg daily.   Recheck in 2 weeks , ok to increase fludi piull to whole  tablet

## 2011-12-19 NOTE — Telephone Encounter (Signed)
Spoke to patient gave her results and instructions as directed.

## 2012-01-02 ENCOUNTER — Other Ambulatory Visit (INDEPENDENT_AMBULATORY_CARE_PROVIDER_SITE_OTHER): Payer: 59

## 2012-01-02 DIAGNOSIS — Z7901 Long term (current) use of anticoagulants: Secondary | ICD-10-CM

## 2012-01-02 NOTE — Addendum Note (Signed)
Addended by: Montine Circle D on: 01/02/2012 12:08 PM   Modules accepted: Orders

## 2012-01-09 ENCOUNTER — Other Ambulatory Visit: Payer: Self-pay | Admitting: Internal Medicine

## 2012-01-16 ENCOUNTER — Other Ambulatory Visit (INDEPENDENT_AMBULATORY_CARE_PROVIDER_SITE_OTHER): Payer: 59

## 2012-01-16 DIAGNOSIS — Z7901 Long term (current) use of anticoagulants: Secondary | ICD-10-CM

## 2012-01-16 LAB — PROTIME-INR
INR: 2.2 ratio — ABNORMAL HIGH (ref 0.8–1.0)
Prothrombin Time: 22.7 s — ABNORMAL HIGH (ref 10.2–12.4)

## 2012-02-09 ENCOUNTER — Other Ambulatory Visit: Payer: Self-pay | Admitting: *Deleted

## 2012-02-09 MED ORDER — WARFARIN SODIUM 5 MG PO TABS: ORAL_TABLET | ORAL | Status: DC

## 2012-02-09 NOTE — Telephone Encounter (Signed)
meds filled

## 2012-02-16 ENCOUNTER — Other Ambulatory Visit: Payer: Self-pay | Admitting: *Deleted

## 2012-02-16 ENCOUNTER — Other Ambulatory Visit (INDEPENDENT_AMBULATORY_CARE_PROVIDER_SITE_OTHER): Payer: 59

## 2012-02-16 DIAGNOSIS — Z7901 Long term (current) use of anticoagulants: Secondary | ICD-10-CM

## 2012-02-16 MED ORDER — WARFARIN SODIUM 6 MG PO TABS: 6.0000 mg | ORAL_TABLET | Freq: Every day | ORAL | Status: DC

## 2012-02-16 NOTE — Telephone Encounter (Signed)
Med filled.  

## 2012-02-17 ENCOUNTER — Encounter: Payer: Self-pay | Admitting: Internal Medicine

## 2012-02-17 LAB — PROTIME-INR
INR: 3.4 — ABNORMAL HIGH (ref 0.8–1.2)
Prothrombin Time: 35.6 s — ABNORMAL HIGH (ref 9.1–12.0)

## 2012-02-21 ENCOUNTER — Other Ambulatory Visit: Payer: Self-pay

## 2012-03-03 ENCOUNTER — Other Ambulatory Visit (INDEPENDENT_AMBULATORY_CARE_PROVIDER_SITE_OTHER): Payer: 59

## 2012-03-03 DIAGNOSIS — Z7901 Long term (current) use of anticoagulants: Secondary | ICD-10-CM

## 2012-03-03 LAB — PROTIME-INR: Prothrombin Time: 19.2 s — ABNORMAL HIGH (ref 10.2–12.4)

## 2012-03-04 ENCOUNTER — Encounter: Payer: Self-pay | Admitting: Internal Medicine

## 2012-03-17 ENCOUNTER — Other Ambulatory Visit: Payer: Self-pay | Admitting: Internal Medicine

## 2012-03-22 ENCOUNTER — Other Ambulatory Visit (INDEPENDENT_AMBULATORY_CARE_PROVIDER_SITE_OTHER): Payer: 59

## 2012-03-22 ENCOUNTER — Encounter: Payer: Self-pay | Admitting: Internal Medicine

## 2012-03-22 DIAGNOSIS — Z7901 Long term (current) use of anticoagulants: Secondary | ICD-10-CM

## 2012-03-22 LAB — PROTIME-INR
INR: 4 ratio — ABNORMAL HIGH (ref 0.8–1.0)
Prothrombin Time: 40.8 s — ABNORMAL HIGH (ref 10.2–12.4)

## 2012-03-24 ENCOUNTER — Encounter: Payer: Self-pay | Admitting: Internal Medicine

## 2012-04-08 ENCOUNTER — Other Ambulatory Visit (INDEPENDENT_AMBULATORY_CARE_PROVIDER_SITE_OTHER): Payer: 59

## 2012-04-08 ENCOUNTER — Telehealth: Payer: Self-pay | Admitting: *Deleted

## 2012-04-08 ENCOUNTER — Encounter: Payer: Self-pay | Admitting: Internal Medicine

## 2012-04-08 DIAGNOSIS — Z7901 Long term (current) use of anticoagulants: Secondary | ICD-10-CM

## 2012-04-08 LAB — PROTIME-INR
INR: 2.5 ratio — ABNORMAL HIGH (ref 0.8–1.0)
Prothrombin Time: 25.8 s — ABNORMAL HIGH (ref 10.2–12.4)

## 2012-04-08 NOTE — Telephone Encounter (Signed)
Noted  

## 2012-04-08 NOTE — Telephone Encounter (Signed)
Pt would like for her warfarin (COUMADIN) 5 and 6 mg to start going to  [facility]

## 2012-04-19 ENCOUNTER — Telehealth: Payer: Self-pay | Admitting: Internal Medicine

## 2012-04-19 MED ORDER — WARFARIN SODIUM 5 MG PO TABS: ORAL_TABLET | ORAL | Status: DC

## 2012-04-19 MED ORDER — HYDROCHLOROTHIAZIDE 25 MG PO TABS: 12.5000 mg | ORAL_TABLET | Freq: Every day | ORAL | Status: DC

## 2012-04-19 MED ORDER — WARFARIN SODIUM 6 MG PO TABS: 6.0000 mg | ORAL_TABLET | Freq: Every day | ORAL | Status: DC

## 2012-04-19 NOTE — Telephone Encounter (Signed)
Pt had requested in March that Coumadin and HCTZ be changed to come from [facility] instead of [facility].  Pt states [facility] still does not show any order coming in.  Pt needs the Coumadin within the next few days and will get her refill from [facility] but needs this changed to [facility] asap.  Please call

## 2012-04-19 NOTE — Telephone Encounter (Signed)
Med filled with [facility].

## 2012-05-07 ENCOUNTER — Other Ambulatory Visit (INDEPENDENT_AMBULATORY_CARE_PROVIDER_SITE_OTHER): Payer: 59

## 2012-05-07 DIAGNOSIS — Z7901 Long term (current) use of anticoagulants: Secondary | ICD-10-CM

## 2012-05-07 LAB — PROTIME-INR: Prothrombin Time: 17.6 s — ABNORMAL HIGH (ref 10.2–12.4)

## 2012-05-08 ENCOUNTER — Encounter: Payer: Self-pay | Admitting: Internal Medicine

## 2012-05-09 ENCOUNTER — Encounter: Payer: Self-pay | Admitting: Internal Medicine

## 2012-05-10 MED ORDER — WARFARIN SODIUM 7.5 MG PO TABS: ORAL_TABLET | ORAL | Status: DC

## 2012-05-12 ENCOUNTER — Telehealth: Payer: Self-pay | Admitting: *Deleted

## 2012-05-12 NOTE — Telephone Encounter (Signed)
Left message for patient to call office.  

## 2012-05-12 NOTE — Telephone Encounter (Signed)
Patient stated you were to call this to her mail order whom did receive but also a short term script to [facility] for warfarin (COUMADIN) 7.5 MG tablet is this okay I will be glad to take care of it.

## 2012-05-12 NOTE — Telephone Encounter (Signed)
Patient received refills.

## 2012-05-12 NOTE — Telephone Encounter (Signed)
I did both same day.   That's why the chart say #30 .  If she assumed I didn't,  Have her check with her pharmacy

## 2012-05-12 NOTE — Telephone Encounter (Signed)
Pt returned your call.  

## 2012-05-26 ENCOUNTER — Other Ambulatory Visit (INDEPENDENT_AMBULATORY_CARE_PROVIDER_SITE_OTHER): Payer: 59

## 2012-05-26 ENCOUNTER — Encounter: Payer: Self-pay | Admitting: Internal Medicine

## 2012-05-26 DIAGNOSIS — Z7901 Long term (current) use of anticoagulants: Secondary | ICD-10-CM

## 2012-05-26 LAB — PROTIME-INR: INR: 5 ratio — ABNORMAL HIGH (ref 0.8–1.0)

## 2012-05-27 ENCOUNTER — Encounter: Payer: Self-pay | Admitting: Internal Medicine

## 2012-05-28 ENCOUNTER — Encounter: Payer: Self-pay | Admitting: Internal Medicine

## 2012-05-28 ENCOUNTER — Other Ambulatory Visit (INDEPENDENT_AMBULATORY_CARE_PROVIDER_SITE_OTHER): Payer: 59

## 2012-05-28 ENCOUNTER — Other Ambulatory Visit: Payer: Self-pay | Admitting: Internal Medicine

## 2012-05-28 DIAGNOSIS — Z7901 Long term (current) use of anticoagulants: Secondary | ICD-10-CM

## 2012-06-07 ENCOUNTER — Encounter: Payer: Self-pay | Admitting: Internal Medicine

## 2012-06-07 ENCOUNTER — Other Ambulatory Visit (INDEPENDENT_AMBULATORY_CARE_PROVIDER_SITE_OTHER): Payer: 59

## 2012-06-07 DIAGNOSIS — Z7901 Long term (current) use of anticoagulants: Secondary | ICD-10-CM

## 2012-06-07 LAB — PROTIME-INR: INR: 2.6 ratio — ABNORMAL HIGH (ref 0.8–1.0)

## 2012-07-05 ENCOUNTER — Other Ambulatory Visit: Payer: Self-pay | Admitting: *Deleted

## 2012-07-05 DIAGNOSIS — Z7901 Long term (current) use of anticoagulants: Secondary | ICD-10-CM

## 2012-07-05 MED ORDER — WARFARIN SODIUM 6 MG PO TABS: 6.0000 mg | ORAL_TABLET | Freq: Every day | ORAL | Status: DC

## 2012-07-06 ENCOUNTER — Other Ambulatory Visit (INDEPENDENT_AMBULATORY_CARE_PROVIDER_SITE_OTHER): Payer: 59

## 2012-07-06 DIAGNOSIS — Z7901 Long term (current) use of anticoagulants: Secondary | ICD-10-CM

## 2012-07-07 ENCOUNTER — Encounter: Payer: Self-pay | Admitting: Internal Medicine

## 2012-07-07 NOTE — Assessment & Plan Note (Signed)
INR 3.3  Dose reduce to 5 mg m w and f and continue 6 mg all other days  Repeat inr 2 weeks

## 2012-07-20 ENCOUNTER — Other Ambulatory Visit: Payer: Self-pay | Admitting: *Deleted

## 2012-07-20 ENCOUNTER — Other Ambulatory Visit (INDEPENDENT_AMBULATORY_CARE_PROVIDER_SITE_OTHER): Payer: 59

## 2012-07-20 DIAGNOSIS — Z7901 Long term (current) use of anticoagulants: Secondary | ICD-10-CM

## 2012-07-20 LAB — PROTIME-INR
INR: 3.5 ratio — ABNORMAL HIGH (ref 0.8–1.0)
Prothrombin Time: 35.7 s — ABNORMAL HIGH (ref 10.2–12.4)

## 2012-07-21 ENCOUNTER — Encounter: Payer: Self-pay | Admitting: Internal Medicine

## 2012-07-21 NOTE — Telephone Encounter (Signed)
[name],  Your INR is now higher at 3.5.  Please stop you coumadin for 2 days and resume at 5 mg daily starting on Friday.  Repeat INR in 2 weeks

## 2012-07-27 ENCOUNTER — Ambulatory Visit (INDEPENDENT_AMBULATORY_CARE_PROVIDER_SITE_OTHER): Payer: 59 | Admitting: Adult Health

## 2012-07-27 ENCOUNTER — Encounter: Payer: Self-pay | Admitting: Adult Health

## 2012-07-27 VITALS — BP 140/82 | HR 72 | Temp 98.3°F | Resp 14 | Wt 171.0 lb

## 2012-07-27 DIAGNOSIS — S30861A Insect bite (nonvenomous) of abdominal wall, initial encounter: Secondary | ICD-10-CM | POA: Insufficient documentation

## 2012-07-27 DIAGNOSIS — W57XXXA Bitten or stung by nonvenomous insect and other nonvenomous arthropods, initial encounter: Secondary | ICD-10-CM

## 2012-07-27 DIAGNOSIS — Z7901 Long term (current) use of anticoagulants: Secondary | ICD-10-CM

## 2012-07-27 MED ORDER — DOXYCYCLINE HYCLATE 100 MG PO TABS: 100.0000 mg | ORAL_TABLET | Freq: Two times a day (BID) | ORAL | Status: DC

## 2012-07-27 MED ORDER — TRIAMCINOLONE ACETONIDE 0.1 % EX CREA: TOPICAL_CREAM | Freq: Two times a day (BID) | CUTANEOUS | Status: DC

## 2012-07-27 NOTE — Progress Notes (Signed)
  Subjective:    Patient ID: [name], female    DOB: 20-Oct-1950, 62 y.o.   MRN: [id number]  HPI Patient is a pleasant 62 year old female who presents to clinic status post tick bite on 07/10/2012. She complains of itching and irritation at the site. She denies fever, chills or any flulike symptoms. She has had mild headaches; however, this is not unusual for her. She has applied topical Benadryl to the area however this is not helping.   Current Outpatient Prescriptions on File Prior to Visit  Medication Sig Dispense Refill  . aspirin 81 MG tablet Take 81 mg by mouth daily.      . butalbital-acetaminophen-caffeine (FIORICET, ESGIC) 50-325-40 MG per tablet Take 1 tablet by mouth as  needed for headache(s) Manufacturer recommends not more than 6 per day  90 tablet  3  . Cholecalciferol (VITAMIN D3) 1000 UNITS CAPS Take by mouth.      . furosemide (LASIX) 20 MG tablet Take 1 tablet (20 mg total) by mouth daily.  30 tablet  1  . hydrochlorothiazide (HYDRODIURIL) 25 MG tablet Take 0.5 tablets (12.5 mg total) by mouth daily.  90 tablet  2  . imipramine (TOFRANIL) 25 MG tablet Take 1 tablet by mouth at  bedtime  90 tablet  3  . metoprolol succinate (TOPROL-XL) 25 MG 24 hr tablet Take 1 tablet by mouth  daily  90 tablet  3  . omeprazole (PRILOSEC) 20 MG capsule Take 20 mg by mouth daily.      . simvastatin (ZOCOR) 40 MG tablet Take 1 tablet by mouth at  bedtime  90 tablet  3  . warfarin (COUMADIN) 6 MG tablet Take 1 tablet (6 mg total) by mouth daily.  30 tablet  2   No current facility-administered medications on file prior to visit.    Review of Systems  Constitutional: Negative for fever and chills.  Respiratory: Negative.   Cardiovascular: Negative.   Skin: Positive for rash.   BP 140/82  Pulse 72  Temp(Src) 98.3 F (36.8 C)  Resp 14  Wt 171 lb (77.565 kg)  BMI 31.79 kg/m2    Objective:   Physical Exam  Constitutional: She is oriented to person, place, and time.   Cardiovascular: Normal rate and regular rhythm.   Pulmonary/Chest: Effort normal. No respiratory distress.  Musculoskeletal: Normal range of motion.  Neurological: She is alert and oriented to person, place, and time.  Skin: Skin is warm and dry. Rash noted. There is erythema.  Left side of the abdomen with irritation and redness surrounding site of tick bite approximately 6 in diameter  Psychiatric: She has a normal mood and affect. Her behavior is normal. Judgment and thought content normal.      Assessment & Plan:

## 2012-07-27 NOTE — Assessment & Plan Note (Signed)
Erythema surrounding the site of tick bite approximately 6 inches in diameter. Start doxycycline 100 mg twice a day x14 days. Triamcinolone cream to affected area twice a day for 5-7 days. May take Benadryl 25 mg every 6 hours as needed for itching.

## 2012-07-27 NOTE — Patient Instructions (Signed)
  Start the Doxycycline twice a day for 14 days.  Apply triamcinolone cream to the affected area twice a day for 5-7 days.  You can also take benadryl to help with the itching.  Please return on Friday morning for PT/INR

## 2012-07-27 NOTE — Assessment & Plan Note (Signed)
Patient starting on antibiotics tonight. Check PT/INR on Friday morning.

## 2012-07-30 ENCOUNTER — Other Ambulatory Visit (INDEPENDENT_AMBULATORY_CARE_PROVIDER_SITE_OTHER): Payer: 59

## 2012-07-30 DIAGNOSIS — Z7901 Long term (current) use of anticoagulants: Secondary | ICD-10-CM

## 2012-07-31 ENCOUNTER — Encounter: Payer: Self-pay | Admitting: Internal Medicine

## 2012-08-02 ENCOUNTER — Other Ambulatory Visit: Payer: Self-pay | Admitting: Adult Health

## 2012-08-02 DIAGNOSIS — Z7901 Long term (current) use of anticoagulants: Secondary | ICD-10-CM

## 2012-08-02 NOTE — Telephone Encounter (Signed)
When does she need PT/INR checked next?

## 2012-08-04 ENCOUNTER — Telehealth: Payer: Self-pay | Admitting: *Deleted

## 2012-08-04 DIAGNOSIS — Z86711 Personal history of pulmonary embolism: Secondary | ICD-10-CM

## 2012-08-04 MED ORDER — WARFARIN SODIUM 5 MG PO TABS: 5.0000 mg | ORAL_TABLET | Freq: Every day | ORAL | Status: DC

## 2012-08-04 NOTE — Telephone Encounter (Signed)
Patient current regimen for coumadin is 5 mg daily script sent to pharmacy for 5 mg.

## 2012-08-09 ENCOUNTER — Other Ambulatory Visit (INDEPENDENT_AMBULATORY_CARE_PROVIDER_SITE_OTHER): Payer: 59

## 2012-08-09 DIAGNOSIS — Z7901 Long term (current) use of anticoagulants: Secondary | ICD-10-CM

## 2012-08-09 LAB — PROTIME-INR
INR: 2.3 ratio — ABNORMAL HIGH (ref 0.8–1.0)
Prothrombin Time: 24.3 s — ABNORMAL HIGH (ref 10.2–12.4)

## 2012-09-09 ENCOUNTER — Encounter: Payer: Self-pay | Admitting: *Deleted

## 2012-09-09 ENCOUNTER — Other Ambulatory Visit: Payer: Self-pay | Admitting: *Deleted

## 2012-09-09 DIAGNOSIS — Z7901 Long term (current) use of anticoagulants: Secondary | ICD-10-CM

## 2012-09-10 ENCOUNTER — Other Ambulatory Visit (INDEPENDENT_AMBULATORY_CARE_PROVIDER_SITE_OTHER): Payer: 59

## 2012-09-10 DIAGNOSIS — Z7901 Long term (current) use of anticoagulants: Secondary | ICD-10-CM

## 2012-09-10 LAB — PROTIME-INR
INR: 2.4 ratio — ABNORMAL HIGH (ref 0.8–1.0)
Prothrombin Time: 24.6 s — ABNORMAL HIGH (ref 10.2–12.4)

## 2012-09-11 ENCOUNTER — Encounter: Payer: Self-pay | Admitting: Internal Medicine

## 2012-10-08 ENCOUNTER — Other Ambulatory Visit (INDEPENDENT_AMBULATORY_CARE_PROVIDER_SITE_OTHER): Payer: 59

## 2012-10-08 ENCOUNTER — Encounter: Payer: Self-pay | Admitting: Internal Medicine

## 2012-10-08 DIAGNOSIS — I2699 Other pulmonary embolism without acute cor pulmonale: Secondary | ICD-10-CM

## 2012-10-08 LAB — PROTIME-INR: Prothrombin Time: 21.3 s — ABNORMAL HIGH (ref 10.2–12.4)

## 2012-10-27 ENCOUNTER — Ambulatory Visit (INDEPENDENT_AMBULATORY_CARE_PROVIDER_SITE_OTHER): Payer: 59 | Admitting: Internal Medicine

## 2012-10-27 ENCOUNTER — Encounter: Payer: Self-pay | Admitting: Internal Medicine

## 2012-10-27 ENCOUNTER — Other Ambulatory Visit (HOSPITAL_COMMUNITY)
Admission: RE | Admit: 2012-10-27 | Discharge: 2012-10-27 | Disposition: A | Discharge status: Home / Self Care | Payer: 59 | Source: Ambulatory Visit | Attending: Internal Medicine | Admitting: Internal Medicine

## 2012-10-27 VITALS — BP 120/78 | HR 83 | Temp 98.4°F | Resp 14 | Ht 61.5 in | Wt 166.8 lb

## 2012-10-27 DIAGNOSIS — Z0000 Encounter for general adult medical examination without abnormal findings: Secondary | ICD-10-CM

## 2012-10-27 DIAGNOSIS — Z7901 Long term (current) use of anticoagulants: Secondary | ICD-10-CM

## 2012-10-27 DIAGNOSIS — Z124 Encounter for screening for malignant neoplasm of cervix: Secondary | ICD-10-CM

## 2012-10-27 DIAGNOSIS — K551 Chronic vascular disorders of intestine: Secondary | ICD-10-CM

## 2012-10-27 DIAGNOSIS — Z79899 Other long term (current) drug therapy: Secondary | ICD-10-CM

## 2012-10-27 DIAGNOSIS — F489 Nonpsychotic mental disorder, unspecified: Secondary | ICD-10-CM

## 2012-10-27 DIAGNOSIS — Z01419 Encounter for gynecological examination (general) (routine) without abnormal findings: Secondary | ICD-10-CM | POA: Insufficient documentation

## 2012-10-27 DIAGNOSIS — I10 Essential (primary) hypertension: Secondary | ICD-10-CM

## 2012-10-27 DIAGNOSIS — Z1239 Encounter for other screening for malignant neoplasm of breast: Secondary | ICD-10-CM

## 2012-10-27 DIAGNOSIS — Z1151 Encounter for screening for human papillomavirus (HPV): Secondary | ICD-10-CM | POA: Insufficient documentation

## 2012-10-27 DIAGNOSIS — E039 Hypothyroidism, unspecified: Secondary | ICD-10-CM

## 2012-10-27 DIAGNOSIS — Z23 Encounter for immunization: Secondary | ICD-10-CM

## 2012-10-27 DIAGNOSIS — F409 Phobic anxiety disorder, unspecified: Secondary | ICD-10-CM

## 2012-10-27 DIAGNOSIS — E559 Vitamin D deficiency, unspecified: Secondary | ICD-10-CM

## 2012-10-27 DIAGNOSIS — E785 Hyperlipidemia, unspecified: Secondary | ICD-10-CM

## 2012-10-27 DIAGNOSIS — E669 Obesity, unspecified: Secondary | ICD-10-CM

## 2012-10-27 LAB — COMPREHENSIVE METABOLIC PANEL
ALT: 21 U/L (ref 0–35)
AST: 23 U/L (ref 0–37)
Albumin: 4 g/dL (ref 3.5–5.2)
Alkaline Phosphatase: 85 U/L (ref 39–117)
Potassium: 3.9 mEq/L (ref 3.5–5.1)
Sodium: 142 mEq/L (ref 135–145)
Total Bilirubin: 0.7 mg/dL (ref 0.3–1.2)
Total Protein: 6.7 g/dL (ref 6.0–8.3)

## 2012-10-27 LAB — CBC WITH DIFFERENTIAL/PLATELET
Basophils Absolute: 0 K/uL (ref 0.0–0.1)
Basophils Relative: 0.7 % (ref 0.0–3.0)
Eosinophils Absolute: 0.1 K/uL (ref 0.0–0.7)
Eosinophils Relative: 2.3 % (ref 0.0–5.0)
HCT: 41.6 % (ref 36.0–46.0)
Hemoglobin: 13.9 g/dL (ref 12.0–15.0)
Lymphocytes Relative: 40.1 % (ref 12.0–46.0)
Lymphs Abs: 1.8 K/uL (ref 0.7–4.0)
MCHC: 33.5 g/dL (ref 30.0–36.0)
MCV: 83.2 fl (ref 78.0–100.0)
Monocytes Absolute: 0.4 K/uL (ref 0.1–1.0)
Monocytes Relative: 8.3 % (ref 3.0–12.0)
Neutro Abs: 2.2 K/uL (ref 1.4–7.7)
Neutrophils Relative %: 48.6 % (ref 43.0–77.0)
Platelets: 164 K/uL (ref 150.0–400.0)
RBC: 5.01 Mil/uL (ref 3.87–5.11)
RDW: 15.3 % — ABNORMAL HIGH (ref 11.5–14.6)
WBC: 4.4 K/uL — ABNORMAL LOW (ref 4.5–10.5)

## 2012-10-27 LAB — LIPID PANEL
Cholesterol: 165 mg/dL (ref 0–200)
HDL: 48.5 mg/dL (ref >39.00)
LDL Cholesterol: 87 mg/dL (ref 0–99)
Total CHOL/HDL Ratio: 3
Triglycerides: 147 mg/dL (ref 0.0–149.0)
VLDL: 29.4 mg/dL (ref 0.0–40.0)

## 2012-10-27 MED ORDER — ZOSTER VACCINE LIVE 19400 UNT/0.65ML ~~LOC~~ SOLR: 0.6500 mL | Freq: Once | SUBCUTANEOUS | Status: DC

## 2012-10-27 MED ORDER — FUROSEMIDE 20 MG PO TABS: 20.0000 mg | ORAL_TABLET | Freq: Every day | ORAL | Status: DC

## 2012-10-27 NOTE — Progress Notes (Signed)
Patient ID: [name], female   DOB: [birth year], 62 y.o.   MRN: [id number]  Subjective:     [name] is a 62 y.o. female and is here for a comprehensive physical exam. The patient reports weight gain.  History   Social History  . Marital Status: Married    Spouse Name: N/A    Number of Children: N/A  . Years of Education: N/A   Occupational History  . Not on file.   Social History Main Topics  . Smoking status: Never Smoker   . Smokeless tobacco: Never Used  . Alcohol Use: No  . Drug Use: No  . Sexual Activity: Not on file   Other Topics Concern  . Not on file   Social History Narrative  . No narrative on file   Health Maintenance  Topic Date Due  . Zostavax  03/31/2009  . Mammogram  08/03/2013  . Influenza Vaccine  08/06/2013  . Pap Smear  10/28/2015  . Colonoscopy  04/07/2020  . Tetanus/tdap  10/28/2022    The following portions of the patient's history were reviewed and updated as appropriate: allergies, current medications, past family history, past medical history, past social history, past surgical history and problem list.  Review of Systems A comprehensive review of systems was negative.   Objective:   BP 120/78  Pulse 83  Temp(Src) 98.4 F (36.9 C) (Oral)  Resp 14  Ht 5' 1.5" (1.562 m)  Wt 166 lb 12 oz (75.637 kg)  BMI 31 kg/m2  SpO2 99%  General Appearance:    Alert, cooperative, no distress, appears stated age  Head:    Normocephalic, without obvious abnormality, atraumatic  Eyes:    PERRL, conjunctiva/corneas clear, EOM's intact, fundi    benign, both eyes  Ears:    Normal TM's and external ear canals, both ears  Nose:   Nares normal, septum midline, mucosa normal, no drainage    or sinus tenderness  Throat:   Lips, mucosa, and tongue normal; teeth and gums normal  Neck:   Supple, symmetrical, trachea midline, no adenopathy;    thyroid:  no enlargement/tenderness/nodules; no carotid   bruit or JVD  Back:     Symmetric, no curvature,  ROM normal, no CVA tenderness  Lungs:     Clear to auscultation bilaterally, respirations unlabored  Chest Wall:    No tenderness or deformity   Heart:    Regular rate and rhythm, S1 and S2 normal, no murmur, rub   or gallop  Breast Exam:    No tenderness, masses, or nipple abnormality  Abdomen:     Soft, non-tender, bowel sounds active all four quadrants,    no masses, no organomegaly  Genitalia:    Pelvic: cervix normal in appearance, external genitalia normal, no adnexal masses or tenderness, no cervical motion tenderness, rectovaginal septum normal, uterus normal size, shape, and consistency and vagina normal without discharge  Extremities:   Extremities normal, atraumatic, no cyanosis or edema  Pulses:   2+ and symmetric all extremities  Skin:   Skin color, texture, turgor normal, no rashes or lesions  Lymph nodes:   Cervical, supraclavicular, and axillary nodes normal  Neurologic:   CNII-XII intact, normal strength, sensation and reflexes    throughout     Assessment:   Obesity, unspecified I have addressed  BMI and recommended a low glycemic index diet utilizing smaller more frequent meals to increase metabolism.  I have also recommended that patient start exercising with a goal  of 30 minutes of aerobic exercise a minimum of 5 days per week. Screening for  thyroid and diabetes was  done today.    Hypertension Well controlled on current regimen. Renal function stable, no changes today.  Lab Results  Component Value Date   CREATININE 1.0 10/27/2012    Other and unspecified hyperlipidemia .LDL is at goal on current medications.  LFTS are normal. Repeat both in 6 months  Lab Results  Component Value Date   CHOL 165 10/27/2012   HDL 48.50 10/27/2012   LDLCALC 87 10/27/2012   TRIG 147.0 10/27/2012   CHOLHDL 3 10/27/2012    Long term (current) use of anticoagulants Lab Results  Component Value Date   INR 2.0* 10/08/2012   INR 2.4* 09/10/2012   INR 2.3* 08/09/2012   No  changes today  Chronic ischemic colitis, enteritis, or enterocolitis No episodes in over a year. Dr Mechele Collin managing   Routine general medical examination at a health care facility Annual exam including breast , pelvic and PAP smear were done today. All screenigns and immunizations discussed and offered.    Updated Medication List Outpatient Encounter Prescriptions as of 10/27/2012  Medication Sig Dispense Refill  . ALPRAZolam (XANAX) 0.25 MG tablet Take 0.25 mg by mouth every 6 (six) hours as needed for anxiety.      Marland Kitchen aspirin 81 MG tablet Take 81 mg by mouth daily.      . butalbital-acetaminophen-caffeine (FIORICET, ESGIC) 50-325-40 MG per tablet Take 1 tablet by mouth as  needed for headache(s) Manufacturer recommends not more than 6 per day  90 tablet  3  . Cholecalciferol (VITAMIN D3) 1000 UNITS CAPS Take by mouth.      Marland Kitchen imipramine (TOFRANIL) 25 MG tablet Take 1 tablet by mouth at  bedtime  90 tablet  3  . metoprolol succinate (TOPROL-XL) 25 MG 24 hr tablet Take 1 tablet by mouth  daily  90 tablet  3  . omeprazole (PRILOSEC) 20 MG capsule Take 20 mg by mouth daily.      . simvastatin (ZOCOR) 40 MG tablet Take 1 tablet by mouth at  bedtime  90 tablet  3  . warfarin (COUMADIN) 5 MG tablet Take 1 tablet (5 mg total) by mouth daily.  90 tablet  3  . [DISCONTINUED] hydrochlorothiazide (HYDRODIURIL) 25 MG tablet Take 0.5 tablets (12.5 mg total) by mouth daily.  90 tablet  2  . furosemide (LASIX) 20 MG tablet Take 1 tablet (20 mg total) by mouth daily.  30 tablet  0  . warfarin (COUMADIN) 6 MG tablet Take 1 tablet (6 mg total) by mouth daily.  30 tablet  2  . zoster vaccine live, PF, (ZOSTAVAX) 16109 UNT/0.65ML injection Inject 19,400 Units into the skin once.  1 each  0  . [DISCONTINUED] doxycycline (VIBRA-TABS) 100 MG tablet Take 1 tablet (100 mg total) by mouth 2 (two) times daily.  28 tablet  0  . [DISCONTINUED] furosemide (LASIX) 20 MG tablet Take 1 tablet (20 mg total) by mouth daily.   30 tablet  1  . [DISCONTINUED] triamcinolone cream (KENALOG) 0.1 % Apply topically 2 (two) times daily.  30 g  0   No facility-administered encounter medications on file as of 10/27/2012.

## 2012-10-27 NOTE — Patient Instructions (Addendum)
I have changed your diuretic from HCTZ to furosemide,  Take it once daily in the morning.   You received the TdaP vaccine.  You do not need the pneumonia vaccine until age 62.  I do recommend a Zoster (Shingles  Vaccine ) but you can get it for less $$$ at a local pharmacy with the script I have provided you.   I agree that you should lose weight,  Gradually. Your first goal is to lose 11 lbs.  This will mean getting to 155 lbs (lowers your BMI to 29.3)   This is  my version of a  "Low GI"  Diet:  It will still lower your blood sugars and allow you to lose 4 to 8  lbs  per month if you follow it carefully.  Your goal with exercise is a minimum of 30 minutes of aerobic exercise 5 days per week (Walking does not count once it becomes easy!)    All of the foods can be found at grocery stores and in bulk at [employer].  The Atkins protein bars and shakes are available in more varieties at Target, WalMart and [employer].     7 AM Breakfast:  Choose from the following:  Low carbohydrate Protein  Shakes (I recommend the EAS AdvantEdge "Carb Control" shakes  Or the low carb shakes by Atkins.    2.5 carbs   Arnold's "Sandwhich Thin"toasted  w/ peanut butter (no jelly: about 20 net carbs  "Bagel Thin" with cream cheese and salmon: about 20 carbs   a scrambled egg/bacon/cheese burrito made with Mission's "carb balance" whole wheat tortilla  (about 10 net carbs )   Avoid cereal and bananas, oatmeal and cream of wheat and grits. They are loaded with carbohydrates!   10 AM: high protein snack  Protein bar by Atkins (the snack size, under 200 cal, usually < 6 net carbs).    A stick of cheese:  Around 1 carb,  100 cal     Dannon Light n Fit [place] Yogurt  (80 cal, 8 carbs)  Other so called "protein bars" and Greek yogurts tend to be loaded with carbohydrates.  Remember, in food advertising, the word "energy" is synonymous for " carbohydrate."  Lunch:   A Sandwich using the bread choices listed, Can use  any  Eggs,  lunchmeat, grilled meat or canned tuna), avocado, regular mayo/mustard  and cheese.  A Salad using blue cheese, ranch,  Goddess or vinagrette,  No croutons or "confetti" and no "candied nuts" but regular nuts OK.   No pretzels or chips.  Pickles and miniature sweet peppers are a good low carb alternative that provide a "crunch"  The bread is the only source of carbohydrate in a sandwich and  can be decreased by trying some of these alternatives to traditional loaf bread  Joseph's makes a pita bread and a flat bread that are 50 cal and 4 net carbs available at BJs and WalMart.  This can be toasted to use with hummous as well  Toufayan makes a low carb flatbread that's 100 cal and 9 net carbs available at [employer] and [employer] makes 2 sizes of  Low carb whole wheat tortilla  (The large one is 210 cal and 6 net carbs) Avoid "Low fat dressings, as well as Reyne Dumas and [address] dressings They are loaded with sugar!   3 PM/ Mid day  Snack:  Consider  1 ounce of  almonds, walnuts, pistachios, pecans, peanuts,  Macadamia nuts or a nut medley.  Avoid "granola"; the dried cranberries and raisins are loaded with carbohydrates. Mixed nuts as long as there are no raisins,  cranberries or dried fruit.     6 PM  Dinner:     Meat/fowl/fish with a green salad, and either broccoli, cauliflower, green beans, spinach, brussel sprouts or  Lima beans. DO NOT BREAD THE PROTEIN!!      There is a low carb pasta by Dreamfield's that is acceptable and tastes great: only 5 digestible carbs/serving.( All grocery stores but BJs carry it )  Try Kai Levins Angelo's chicken piccata or chicken or eggplant parm over low carb pasta.(Lowes and BJs)   Clifton Custard Sanchez's "Carnitas" (pulled pork, no sauce,  0 carbs) or his beef pot roast to make a dinner burrito (at BJ's)  Pesto over low carb pasta (bj's sells a good quality pesto in the center refrigerated section of the deli   Whole wheat pasta is still full of  digestible carbs and  Not as low in glycemic index as Dreamfield's.   Brown rice is still rice,  So skip the rice and noodles if you eat [place] or [place] (or at least limit to 1/2 cup)  9 PM snack :   Breyer's "low carb" fudgsicle or  ice cream bar (Carb Smart line), or  Weight Watcher's ice cream bar , or another "no sugar added" ice cream;  a serving of fresh berries/cherries with whipped cream   Cheese or DANNON'S Ll[place] [place] YOGURT  Avoid bananas, pineapple, grapes  and watermelon on a regular basis because they are high in sugar.  THINK OF THEM AS DESSERT  Remember that snack Substitutions should be less than 10 NET carbs per serving and meals < 20 carbs. Remember to subtract fiber grams to get the "net carbs."

## 2012-10-28 ENCOUNTER — Encounter: Payer: Self-pay | Admitting: Internal Medicine

## 2012-10-28 DIAGNOSIS — E785 Hyperlipidemia, unspecified: Secondary | ICD-10-CM | POA: Insufficient documentation

## 2012-10-28 DIAGNOSIS — Z0000 Encounter for general adult medical examination without abnormal findings: Secondary | ICD-10-CM | POA: Insufficient documentation

## 2012-10-28 DIAGNOSIS — E663 Overweight: Secondary | ICD-10-CM | POA: Insufficient documentation

## 2012-10-28 LAB — VITAMIN D 25 HYDROXY (VIT D DEFICIENCY, FRACTURES): Vit D, 25-Hydroxy: 48 ng/mL (ref 30–89)

## 2012-10-28 NOTE — Assessment & Plan Note (Addendum)
No episodes in over a year. Dr Mechele Collin managing

## 2012-10-28 NOTE — Assessment & Plan Note (Signed)
Annual exam including breast , pelvic and PAP smear were done today. All screenigns and immunizations discussed and offered.

## 2012-10-28 NOTE — Assessment & Plan Note (Signed)
I have addressed  BMI and recommended a low glycemic index diet utilizing smaller more frequent meals to increase metabolism.  I have also recommended that patient start exercising with a goal of 30 minutes of aerobic exercise a minimum of 5 days per week. Screening for  thyroid and diabetes was  done today.

## 2012-10-28 NOTE — Assessment & Plan Note (Signed)
Lab Results  Component Value Date   INR 2.0* 10/08/2012   INR 2.4* 09/10/2012   INR 2.3* 08/09/2012   No changes today

## 2012-10-28 NOTE — Assessment & Plan Note (Signed)
.  LDL is at goal on current medications.  LFTS are normal. Repeat both in 6 months  Lab Results  Component Value Date   CHOL 165 10/27/2012   HDL 48.50 10/27/2012   LDLCALC 87 10/27/2012   TRIG 147.0 10/27/2012   CHOLHDL 3 10/27/2012

## 2012-10-28 NOTE — Assessment & Plan Note (Signed)
Well controlled on current regimen. Renal function stable, no changes today.  Lab Results  Component Value Date   CREATININE 1.0 10/27/2012

## 2012-11-09 ENCOUNTER — Other Ambulatory Visit (INDEPENDENT_AMBULATORY_CARE_PROVIDER_SITE_OTHER): Payer: 59

## 2012-11-09 DIAGNOSIS — I2699 Other pulmonary embolism without acute cor pulmonale: Secondary | ICD-10-CM

## 2012-11-09 LAB — PROTIME-INR
INR: 2.2 ratio — ABNORMAL HIGH (ref 0.8–1.0)
Prothrombin Time: 22.6 s — ABNORMAL HIGH (ref 10.2–12.4)

## 2012-11-10 ENCOUNTER — Encounter: Payer: Self-pay | Admitting: Internal Medicine

## 2012-11-23 ENCOUNTER — Encounter: Payer: Self-pay | Admitting: Internal Medicine

## 2012-11-23 ENCOUNTER — Other Ambulatory Visit: Payer: Self-pay | Admitting: Internal Medicine

## 2012-11-23 DIAGNOSIS — I10 Essential (primary) hypertension: Secondary | ICD-10-CM

## 2012-11-24 MED ORDER — FUROSEMIDE 20 MG PO TABS: 20.0000 mg | ORAL_TABLET | Freq: Every day | ORAL | Status: DC

## 2012-12-09 ENCOUNTER — Other Ambulatory Visit (INDEPENDENT_AMBULATORY_CARE_PROVIDER_SITE_OTHER): Payer: 59

## 2012-12-09 DIAGNOSIS — I2699 Other pulmonary embolism without acute cor pulmonale: Secondary | ICD-10-CM

## 2012-12-09 LAB — PROTIME-INR
INR: 1.9 ratio — ABNORMAL HIGH (ref 0.8–1.0)
Prothrombin Time: 19.4 s — ABNORMAL HIGH (ref 10.2–12.4)

## 2012-12-10 ENCOUNTER — Encounter: Payer: Self-pay | Admitting: Internal Medicine

## 2012-12-27 ENCOUNTER — Other Ambulatory Visit (INDEPENDENT_AMBULATORY_CARE_PROVIDER_SITE_OTHER): Payer: 59

## 2012-12-27 DIAGNOSIS — I2699 Other pulmonary embolism without acute cor pulmonale: Secondary | ICD-10-CM

## 2012-12-27 LAB — PROTIME-INR: INR: 2.7 ratio — ABNORMAL HIGH (ref 0.8–1.0)

## 2012-12-28 ENCOUNTER — Encounter: Payer: Self-pay | Admitting: Internal Medicine

## 2013-01-12 ENCOUNTER — Other Ambulatory Visit: Payer: Self-pay | Admitting: Internal Medicine

## 2013-01-28 ENCOUNTER — Other Ambulatory Visit (INDEPENDENT_AMBULATORY_CARE_PROVIDER_SITE_OTHER): Payer: 59

## 2013-01-28 DIAGNOSIS — I2699 Other pulmonary embolism without acute cor pulmonale: Secondary | ICD-10-CM

## 2013-01-28 LAB — PROTIME-INR
INR: 2.7 ratio — ABNORMAL HIGH (ref 0.8–1.0)
Prothrombin Time: 27.7 s — ABNORMAL HIGH (ref 10.2–12.4)

## 2013-01-30 ENCOUNTER — Encounter: Payer: Self-pay | Admitting: Internal Medicine

## 2013-03-02 ENCOUNTER — Encounter: Payer: Self-pay | Admitting: Internal Medicine

## 2013-03-02 ENCOUNTER — Other Ambulatory Visit (INDEPENDENT_AMBULATORY_CARE_PROVIDER_SITE_OTHER): Payer: 59

## 2013-03-02 DIAGNOSIS — I2699 Other pulmonary embolism without acute cor pulmonale: Secondary | ICD-10-CM

## 2013-03-02 LAB — PROTIME-INR
INR: 2.4 ratio — ABNORMAL HIGH (ref 0.8–1.0)
Prothrombin Time: 25.2 s — ABNORMAL HIGH (ref 10.2–12.4)

## 2013-03-03 ENCOUNTER — Other Ambulatory Visit: Payer: 59

## 2013-03-31 ENCOUNTER — Other Ambulatory Visit (INDEPENDENT_AMBULATORY_CARE_PROVIDER_SITE_OTHER): Payer: 59

## 2013-03-31 DIAGNOSIS — I2699 Other pulmonary embolism without acute cor pulmonale: Secondary | ICD-10-CM

## 2013-03-31 LAB — PROTIME-INR
INR: 2.5 ratio — ABNORMAL HIGH (ref 0.8–1.0)
Prothrombin Time: 25.6 s — ABNORMAL HIGH (ref 10.2–12.4)

## 2013-04-01 ENCOUNTER — Encounter: Payer: Self-pay | Admitting: Internal Medicine

## 2013-05-02 ENCOUNTER — Other Ambulatory Visit (INDEPENDENT_AMBULATORY_CARE_PROVIDER_SITE_OTHER): Payer: 59

## 2013-05-02 DIAGNOSIS — I2699 Other pulmonary embolism without acute cor pulmonale: Secondary | ICD-10-CM

## 2013-05-02 LAB — PROTIME-INR
INR: 3 ratio — AB (ref 0.8–1.0)
PROTHROMBIN TIME: 32.6 s — AB (ref 9.6–13.1)

## 2013-05-03 ENCOUNTER — Encounter: Payer: Self-pay | Admitting: Internal Medicine

## 2013-05-03 ENCOUNTER — Other Ambulatory Visit: Payer: Self-pay | Admitting: Internal Medicine

## 2013-05-03 MED ORDER — WARFARIN SODIUM 6 MG PO TABS: ORAL_TABLET | ORAL | Status: DC

## 2013-05-25 ENCOUNTER — Encounter: Payer: Self-pay | Admitting: Internal Medicine

## 2013-06-01 ENCOUNTER — Other Ambulatory Visit (INDEPENDENT_AMBULATORY_CARE_PROVIDER_SITE_OTHER): Payer: 59

## 2013-06-01 DIAGNOSIS — I2699 Other pulmonary embolism without acute cor pulmonale: Secondary | ICD-10-CM

## 2013-06-01 LAB — PROTIME-INR
INR: 2 ratio — AB (ref 0.8–1.0)
PROTHROMBIN TIME: 21.6 s — AB (ref 9.6–13.1)

## 2013-06-02 ENCOUNTER — Encounter: Payer: Self-pay | Admitting: Internal Medicine

## 2013-06-21 ENCOUNTER — Other Ambulatory Visit: Payer: Self-pay | Admitting: Internal Medicine

## 2013-07-04 ENCOUNTER — Other Ambulatory Visit (INDEPENDENT_AMBULATORY_CARE_PROVIDER_SITE_OTHER): Payer: 59

## 2013-07-04 DIAGNOSIS — Z5181 Encounter for therapeutic drug level monitoring: Secondary | ICD-10-CM

## 2013-07-04 DIAGNOSIS — Z7901 Long term (current) use of anticoagulants: Secondary | ICD-10-CM

## 2013-07-04 LAB — PROTIME-INR
INR: 4.2 ratio — ABNORMAL HIGH (ref 0.8–1.0)
Prothrombin Time: 44.9 s — ABNORMAL HIGH (ref 9.6–13.1)

## 2013-07-05 ENCOUNTER — Encounter: Payer: Self-pay | Admitting: Internal Medicine

## 2013-07-19 ENCOUNTER — Other Ambulatory Visit (INDEPENDENT_AMBULATORY_CARE_PROVIDER_SITE_OTHER): Payer: No Typology Code available for payment source

## 2013-07-19 DIAGNOSIS — I2699 Other pulmonary embolism without acute cor pulmonale: Secondary | ICD-10-CM

## 2013-07-19 LAB — PROTIME-INR
INR: 2.2 ratio — ABNORMAL HIGH (ref 0.8–1.0)
Prothrombin Time: 23.4 s — ABNORMAL HIGH (ref 9.6–13.1)

## 2013-07-20 ENCOUNTER — Encounter: Payer: Self-pay | Admitting: Internal Medicine

## 2013-08-01 ENCOUNTER — Other Ambulatory Visit: Payer: Self-pay | Admitting: *Deleted

## 2013-08-01 DIAGNOSIS — Z86711 Personal history of pulmonary embolism: Secondary | ICD-10-CM

## 2013-08-01 MED ORDER — WARFARIN SODIUM 5 MG PO TABS: 5.0000 mg | ORAL_TABLET | Freq: Every day | ORAL | Status: DC

## 2013-08-03 ENCOUNTER — Telehealth: Payer: Self-pay | Admitting: Internal Medicine

## 2013-08-03 DIAGNOSIS — Z86711 Personal history of pulmonary embolism: Secondary | ICD-10-CM

## 2013-08-03 MED ORDER — WARFARIN SODIUM 5 MG PO TABS: 5.0000 mg | ORAL_TABLET | Freq: Every day | ORAL | Status: DC

## 2013-08-03 NOTE — Telephone Encounter (Signed)
Patient is no longer with [facility] all scripts will go to [address].

## 2013-08-12 ENCOUNTER — Encounter: Payer: Self-pay | Admitting: Adult Health

## 2013-08-12 ENCOUNTER — Ambulatory Visit (INDEPENDENT_AMBULATORY_CARE_PROVIDER_SITE_OTHER)
Admission: RE | Admit: 2013-08-12 | Discharge: 2013-08-12 | Disposition: A | Discharge status: Home / Self Care | Payer: No Typology Code available for payment source | Source: Ambulatory Visit | Attending: Adult Health | Admitting: Adult Health

## 2013-08-12 ENCOUNTER — Ambulatory Visit (INDEPENDENT_AMBULATORY_CARE_PROVIDER_SITE_OTHER): Payer: No Typology Code available for payment source | Admitting: Adult Health

## 2013-08-12 VITALS — BP 151/90 | HR 76 | Temp 98.2°F | Resp 14 | Ht 61.5 in | Wt 165.5 lb

## 2013-08-12 DIAGNOSIS — M25569 Pain in unspecified knee: Secondary | ICD-10-CM

## 2013-08-12 DIAGNOSIS — R238 Other skin changes: Secondary | ICD-10-CM

## 2013-08-12 DIAGNOSIS — M25562 Pain in left knee: Secondary | ICD-10-CM

## 2013-08-12 DIAGNOSIS — B029 Zoster without complications: Secondary | ICD-10-CM

## 2013-08-12 MED ORDER — VALACYCLOVIR HCL 1 G PO TABS: 1000.0000 mg | ORAL_TABLET | Freq: Three times a day (TID) | ORAL | Status: DC

## 2013-08-12 MED ORDER — HYDROCODONE-ACETAMINOPHEN 5-325 MG PO TABS: 1.0000 | ORAL_TABLET | Freq: Four times a day (QID) | ORAL | Status: DC | PRN

## 2013-08-12 NOTE — Progress Notes (Signed)
Pre visit review using our clinic review tool, if applicable. No additional management support is needed unless otherwise documented below in the visit note. 

## 2013-08-12 NOTE — Progress Notes (Signed)
Patient ID: [name], female   DOB: June 30, 1950, 63 y.o.   MRN: [id number]    Subjective:    Patient ID: [name], female    DOB: 07-Apr-1950, 63 y.o.   MRN: [id number]  HPI  Pt is a pleasant 63 y/o female who presents to clinic with a rash on the left side of chest and around to her back. The rash is underneath her left breast. She reports feeling pain that feels like "it goes right through to my back". She applied some lavender oil and also used Benadryl thinking that it was a mosquito bite initially. Once she saw that the rash began to spread she suspected it might be shingles.  Patient also reports ongoing left knee pain for approximately 6 weeks. No history of injury. She has been taking ibuprofen and applying ice to the area. She has also been using a knee brace. She occasionally feels a popping sensation. Reports that pain is worse with moving from sitting to standing position and also standing. It feels better with walking.  Past Medical History  Diagnosis Date  . Hypertension   . Hyperlipidemia   . H/O toxic shock syndrome 2001    post urologic procedure for stone removal  . 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 ALPRAZolam (XANAX) 0.25 MG tablet Take 0.25 mg by mouth every 6 (six) hours as needed for anxiety.      Marland Kitchen aspirin 81 MG tablet Take 81 mg by mouth daily.      . butalbital-acetaminophen-caffeine (FIORICET, ESGIC) 50-325-40 MG per tablet Take 1 tablet by mouth as  needed for headache.  Manufacturer recommends no  more than 6 tablets per day  90 tablet  0  . Cholecalciferol (VITAMIN D3) 1000 UNITS CAPS Take by mouth.      . furosemide (LASIX) 20 MG tablet Take 1 tablet by mouth  daily  90 tablet  1  . imipramine (TOFRANIL) 25 MG tablet Take 1 tablet by mouth  at  bedtime  90 tablet  1  . metoprolol succinate (TOPROL-XL) 25 MG 24 hr tablet Take 1 tablet by mouth  daily  90 tablet  0  . omeprazole (PRILOSEC) 20 MG capsule Take 20 mg by mouth daily.      . simvastatin (ZOCOR) 40 MG tablet Take 1 tablet by mouth at  bedtime  90 tablet  2  . warfarin (COUMADIN) 5 MG tablet Take 1 tablet (5 mg total) by mouth daily.  90 tablet  3  . warfarin (COUMADIN) 6 MG tablet Take 1 tablet by mouth  daily  90 tablet  0  . zoster vaccine live, PF, (ZOSTAVAX) 29937 UNT/0.65ML injection Inject 19,400 Units into the skin once.  1 each  0   No current facility-administered medications on file prior to visit.     Review of Systems  Musculoskeletal: Positive for arthralgias and joint swelling.       Left knee pain  Skin: Positive for rash (under left breast and around to her back).  Neurological:       Burning pain with rash  All other systems reviewed and are negative.      Objective:  BP 151/90  Pulse 76  Temp(Src) 98.2 F (36.8 C) (Oral)  Resp 14  Ht 5' 1.5" (1.562 m)  Wt 165 lb 8 oz (75.07 kg)  BMI 30.77 kg/m2  SpO2 97%   Physical Exam  Constitutional: She is oriented to person, place, and time. She appears well-developed and well-nourished. No distress.  Cardiovascular: Normal rate and regular rhythm.   Pulmonary/Chest: Effort normal. No respiratory distress.  Musculoskeletal: Normal range of motion.  Crepitus of knee with extension and flexion of left LE. Pain with palpation behind her knee and medial aspect of knee. Minimal swelling identified  Neurological: She is alert and oriented to person, place, and time.  Skin: Rash (vesicular rash in clusters under left breast and around to her back) noted.  Psychiatric: She has a normal mood and affect. Her behavior is normal. Judgment and thought content normal.      Assessment & Plan:   1. Vesicular rash Shingles - within 72 hour window of starting medication. May use calamine lotion. Vicodin for  pain. - valACYclovir (VALTREX) 1000 MG tablet; Take 1 tablet (1,000 mg total) by mouth 3 (three) times daily.  Dispense: 21 tablet; Refill: 0  2. Left knee pain Will send for xray. Consider referral to ortho given no relief in 6 weeks with conservative tx.  - DG Knee Complete 4 Views Left; Future

## 2013-08-12 NOTE — Patient Instructions (Signed)
  Please go to our [facility] for an xray of your left knee. We will notify you of results and further instructions once the results are available.  Start Valtrex 1000 mg 3 times a day for 7 days. Use calamine lotion. Aveeno Oatmeal wash may also be soothing. I am giving you a prescription for Vicodin to take at bedtime. During the day you may continue either tylenol or ibuprofen.

## 2013-08-15 ENCOUNTER — Other Ambulatory Visit: Payer: Self-pay | Admitting: Adult Health

## 2013-08-15 DIAGNOSIS — M25562 Pain in left knee: Secondary | ICD-10-CM

## 2013-08-23 ENCOUNTER — Emergency Department: Payer: Self-pay | Admitting: Emergency Medicine

## 2013-08-25 ENCOUNTER — Ambulatory Visit: Payer: 59 | Admitting: Internal Medicine

## 2013-08-26 ENCOUNTER — Other Ambulatory Visit (INDEPENDENT_AMBULATORY_CARE_PROVIDER_SITE_OTHER): Payer: No Typology Code available for payment source

## 2013-08-26 DIAGNOSIS — Z7901 Long term (current) use of anticoagulants: Secondary | ICD-10-CM

## 2013-08-27 ENCOUNTER — Encounter: Payer: Self-pay | Admitting: Internal Medicine

## 2013-08-27 LAB — PROTIME-INR
INR: 2.6 ratio — ABNORMAL HIGH (ref 0.8–1.0)
Prothrombin Time: 28.3 s — ABNORMAL HIGH (ref 9.6–13.1)

## 2013-09-05 ENCOUNTER — Encounter: Payer: Self-pay | Admitting: Internal Medicine

## 2013-09-05 MED ORDER — IMIPRAMINE HCL 25 MG PO TABS: ORAL_TABLET | ORAL | Status: DC

## 2013-09-21 ENCOUNTER — Ambulatory Visit: Payer: Self-pay | Admitting: Internal Medicine

## 2013-09-21 LAB — HM MAMMOGRAPHY: HM Mammogram: NEGATIVE

## 2013-09-22 ENCOUNTER — Encounter: Payer: Self-pay | Admitting: *Deleted

## 2013-09-30 ENCOUNTER — Other Ambulatory Visit: Payer: Self-pay | Admitting: Internal Medicine

## 2013-09-30 ENCOUNTER — Other Ambulatory Visit: Payer: Self-pay | Admitting: *Deleted

## 2013-09-30 MED ORDER — IMIPRAMINE HCL 25 MG PO TABS: ORAL_TABLET | ORAL | Status: DC

## 2013-09-30 MED ORDER — METOPROLOL SUCCINATE ER 25 MG PO TB24: ORAL_TABLET | ORAL | Status: DC

## 2013-09-30 MED ORDER — FUROSEMIDE 20 MG PO TABS: ORAL_TABLET | ORAL | Status: DC

## 2013-10-03 ENCOUNTER — Other Ambulatory Visit (INDEPENDENT_AMBULATORY_CARE_PROVIDER_SITE_OTHER): Payer: No Typology Code available for payment source

## 2013-10-03 DIAGNOSIS — Z7901 Long term (current) use of anticoagulants: Secondary | ICD-10-CM

## 2013-10-03 LAB — PROTIME-INR
INR: 1.9 ratio — ABNORMAL HIGH (ref 0.8–1.0)
Prothrombin Time: 21.1 s — ABNORMAL HIGH (ref 9.6–13.1)

## 2013-10-04 ENCOUNTER — Encounter: Payer: Self-pay | Admitting: Internal Medicine

## 2013-10-27 ENCOUNTER — Other Ambulatory Visit (INDEPENDENT_AMBULATORY_CARE_PROVIDER_SITE_OTHER): Payer: No Typology Code available for payment source

## 2013-10-27 DIAGNOSIS — Z7901 Long term (current) use of anticoagulants: Secondary | ICD-10-CM

## 2013-10-27 LAB — PROTIME-INR
INR: 3.7 ratio — ABNORMAL HIGH (ref 0.8–1.0)
Prothrombin Time: 39.4 s — ABNORMAL HIGH (ref 9.6–13.1)

## 2013-10-28 ENCOUNTER — Encounter: Payer: Self-pay | Admitting: Internal Medicine

## 2013-11-02 ENCOUNTER — Encounter: Payer: Self-pay | Admitting: Internal Medicine

## 2013-11-02 ENCOUNTER — Ambulatory Visit (INDEPENDENT_AMBULATORY_CARE_PROVIDER_SITE_OTHER): Payer: No Typology Code available for payment source | Admitting: Internal Medicine

## 2013-11-02 VITALS — BP 158/98 | HR 98 | Temp 98.5°F | Resp 16 | Ht 61.5 in | Wt 161.5 lb

## 2013-11-02 DIAGNOSIS — E559 Vitamin D deficiency, unspecified: Secondary | ICD-10-CM

## 2013-11-02 DIAGNOSIS — Z7901 Long term (current) use of anticoagulants: Secondary | ICD-10-CM

## 2013-11-02 DIAGNOSIS — Z8619 Personal history of other infectious and parasitic diseases: Secondary | ICD-10-CM

## 2013-11-02 DIAGNOSIS — Z8739 Personal history of other diseases of the musculoskeletal system and connective tissue: Secondary | ICD-10-CM

## 2013-11-02 DIAGNOSIS — Z95828 Presence of other vascular implants and grafts: Secondary | ICD-10-CM

## 2013-11-02 DIAGNOSIS — Z0000 Encounter for general adult medical examination without abnormal findings: Secondary | ICD-10-CM

## 2013-11-02 DIAGNOSIS — I10 Essential (primary) hypertension: Secondary | ICD-10-CM

## 2013-11-02 DIAGNOSIS — Z23 Encounter for immunization: Secondary | ICD-10-CM

## 2013-11-02 DIAGNOSIS — Z90710 Acquired absence of both cervix and uterus: Secondary | ICD-10-CM

## 2013-11-02 DIAGNOSIS — R5383 Other fatigue: Secondary | ICD-10-CM

## 2013-11-02 DIAGNOSIS — K551 Chronic vascular disorders of intestine: Secondary | ICD-10-CM

## 2013-11-02 DIAGNOSIS — Z9889 Other specified postprocedural states: Secondary | ICD-10-CM

## 2013-11-02 DIAGNOSIS — Z87828 Personal history of other (healed) physical injury and trauma: Secondary | ICD-10-CM

## 2013-11-02 DIAGNOSIS — E669 Obesity, unspecified: Secondary | ICD-10-CM

## 2013-11-02 DIAGNOSIS — K760 Fatty (change of) liver, not elsewhere classified: Secondary | ICD-10-CM

## 2013-11-02 DIAGNOSIS — E785 Hyperlipidemia, unspecified: Secondary | ICD-10-CM

## 2013-11-02 DIAGNOSIS — Z1239 Encounter for other screening for malignant neoplasm of breast: Secondary | ICD-10-CM

## 2013-11-02 LAB — COMPREHENSIVE METABOLIC PANEL
ALBUMIN: 3.5 g/dL (ref 3.5–5.2)
ALT: 23 U/L (ref 0–35)
AST: 24 U/L (ref 0–37)
Alkaline Phosphatase: 114 U/L (ref 39–117)
BUN: 18 mg/dL (ref 6–23)
CALCIUM: 9.3 mg/dL (ref 8.4–10.5)
CHLORIDE: 107 meq/L (ref 96–112)
CO2: 21 mEq/L (ref 19–32)
CREATININE: 1 mg/dL (ref 0.4–1.2)
GFR: 57.23 mL/min — AB (ref >60.00)
Glucose, Bld: 96 mg/dL (ref 70–99)
POTASSIUM: 3.8 meq/L (ref 3.5–5.1)
Sodium: 143 mEq/L (ref 135–145)
Total Bilirubin: 0.5 mg/dL (ref 0.2–1.2)
Total Protein: 6.5 g/dL (ref 6.0–8.3)

## 2013-11-02 LAB — PROTIME-INR
INR: 2.1 ratio — ABNORMAL HIGH (ref 0.8–1.0)
Prothrombin Time: 22.8 s — ABNORMAL HIGH (ref 9.6–13.1)

## 2013-11-02 LAB — LDL CHOLESTEROL, DIRECT: LDL DIRECT: 118.3 mg/dL

## 2013-11-02 LAB — TSH: TSH: 1.66 u[IU]/mL (ref 0.35–4.50)

## 2013-11-02 LAB — VITAMIN D 25 HYDROXY (VIT D DEFICIENCY, FRACTURES): VITD: 21.22 ng/mL — ABNORMAL LOW (ref 30.00–100.00)

## 2013-11-02 NOTE — Patient Instructions (Signed)

## 2013-11-02 NOTE — Progress Notes (Signed)
Pre-visit discussion using our clinic review tool. No additional management support is needed unless otherwise documented below in the visit note.  

## 2013-11-02 NOTE — Progress Notes (Signed)
Patient ID: [name], female   DOB: 06-20-1950, 63 y.o.   MRN: [id number]     Subjective:     [name] is a 63 y.o. female and is here for a comprehensive physical exam. The patient reports problems - with right knee pain..   She has been having Bilateral knee issues.  Left knee pain started in April after starting a walking program.  Eventually received a cortisone injection in  LEFT knee  With excellent result  xrays normal on the left.    Acute onset of severe Right knee pain while climbing a step.   Leg gave way . Occurred on  August 18th. Referred by ER to [facility] on August 20th, clinical diagnosis presumed meniscal  or MCL tear, treated with nonweight bearing and brace since then. 2 weeks ago pain in right knee started shooting down the medial side of leg and noticed a bulge on lateral side,  Cortisone injection given,   Icing it in evenings,  Keeping brace on ,  No exercise per smith , and daily meloxicam.    Had an episode of shingles in august occurring under left breast, treated to resolution by RR    Discussed fatty ,liver, no diagnosis by me,  May have been Dr Vira Agar??     History   Social History  . Marital Status: Married    Spouse Name: N/A    Number of Children: N/A  . Years of Education: N/A   Occupational History  . Not on file.   Social History Main Topics  . Smoking status: Never Smoker   . Smokeless tobacco: Never Used  . Alcohol Use: No  . Drug Use: No  . Sexual Activity: Yes   Other Topics Concern  . Not on file   Social History Narrative  . No narrative on file   Health Maintenance  Topic Date Due  . Zostavax  03/31/2009  . Influenza Vaccine  08/07/2014  . Mammogram  09/22/2015  . Pap Smear  10/28/2015  . Colonoscopy  04/07/2020  . Tetanus/tdap  10/28/2022    Review of Systems A comprehensive review of systems was negative.   Objective:    BP 158/98  Pulse 98  Temp(Src) 98.5 F (36.9 C) (Oral)  Resp 16  Ht 5'  1.5" (1.562 m)  Wt 161 lb 8 oz (73.256 kg)  BMI 30.02 kg/m2  SpO2 96%  General appearance: alert, cooperative and appears stated age Head: Normocephalic, without obvious abnormality, atraumatic Eyes: conjunctivae/corneas clear. PERRL, EOM's intact. Fundi benign. Ears: normal TM's and external ear canals both ears Nose: Nares normal. Septum midline. Mucosa normal. No drainage or sinus tenderness. Throat: lips, mucosa, and tongue normal; teeth and gums normal Neck: no adenopathy, no carotid bruit, no JVD, supple, symmetrical, trachea midline and thyroid not enlarged, symmetric, no tenderness/mass/nodules Lungs: clear to auscultation bilaterally Breasts: normal appearance, no masses or tenderness Heart: regular rate and rhythm, S1, S2 normal, no murmur, click, rub or gallop Abdomen: soft, non-tender; bowel sounds normal; no masses,  no organomegaly Extremities: extremities normal, atraumatic, no cyanosis or edema Pulses: 2+ and symmetric Skin: Skin color, texture, turgor normal. No rashes or lesions.  No excessibe bruising  Neurologic: Alert and oriented X 3, normal strength and tone. Normal symmetric reflexes. Normal coordination and gait.  MSK;  Mild right knee swelling without effusion.   .    Assessment and Plan:    Chronic ischemic colitis, enteritis, or enterocolitis Secondary to hypercoagulable state  s/p right colectomy May 2013, [place].  No episodes since her surgery    Hepatic steatosis Diagnosis unclear.  There is n history of hepatic steatosis remarked upon by CT imagine done in 2013 and no abd ultrasound done since then. LFTs are normal.  Dx may have been made by Dr. Vira Agar. Discussed the potential ramifications of diagnosis with patient including management, prognosisand need for vaccination against Hepatitis A and B.  She has vaccination status for Hep b but not Hepatitis A by today's labs.   Lab Results  Component Value Date   ALT 23 11/02/2013   AST 24 11/02/2013    ALKPHOS 114 11/02/2013   BILITOT 0.5 11/02/2013     Obesity II have addressed  BMI and recommended wt loss of 10% of body weight over the next 6 months using a low glycemic index diet and regular exercise (when her orthopedic issues allow) a minimum of 5 days per week.    Hyperlipidemia LDL goal <130 LDL and triglycerides are at goal on current medications. She has no side effects and liver enzymes are normal. No changes today .  Lab Results  Component Value Date   CHOL 165 10/27/2012   HDL 48.50 10/27/2012   LDLCALC 87 10/27/2012   LDLDIRECT 118.3 11/02/2013   TRIG 147.0 10/27/2012   CHOLHDL 3 10/27/2012   Lab Results  Component Value Date   ALT 23 11/02/2013   AST 24 11/02/2013   ALKPHOS 114 11/02/2013   BILITOT 0.5 11/02/2013     Long term current use of anticoagulant therapy Coumadin level is therapeutic,  Continue current regimen and repeat PT/INR in one month./    Encounter for preventive health examination Annual preventive  exam was done as well as a management of acute and chronic conditions .  During the course of the visit the patient was educated and counseled about appropriate screening and preventive services including : fall prevention , diabetes screening, nutrition counseling, colorectal cancer screening, and recommended immunizations.  Printed recommendations for health maintenance screenings was given.   History of meniscal tear By history August 2013,  Conservative management by Eric Form thus far.   History of shingles Left thoracic wall,  August 2015,  Occurring after steroid intraarticular injection, treated with acyclovir .  Patient to return for Zostavax in January,   Vitamin D deficiency Addressed previously with weekly megadose for level < 25.  Repeat Vit D level is again low,  Will repeat weekly dose for 3 months, followed by a daily supplement  Hypertension Not well controlled on current regimen. Renal function stable, no changes today.    Reviewed list of meds, patient is taking meloxicam  that could be causing,. It.  Have asked patient to recheck bp at home a minimum of 5 times over the next 4 weeks and call readings to office for adjustment of medications.    Lab Results  Component Value Date   CREATININE 1.0 11/02/2013   Lab Results  Component Value Date   NA 143 11/02/2013   K 3.8 11/02/2013   CL 107 11/02/2013   CO2 21 11/02/2013       Updated Medication List Outpatient Encounter Prescriptions as of 11/02/2013  Medication Sig  . ALPRAZolam (XANAX) 0.25 MG tablet Take 0.25 mg by mouth every 6 (six) hours as needed for anxiety.  Marland Kitchen aspirin 81 MG tablet Take 81 mg by mouth daily.  . Cholecalciferol (VITAMIN D3) 1000 UNITS CAPS Take by mouth.  . furosemide (LASIX) 20 MG  tablet Take 1 tablet by mouth  daily  . imipramine (TOFRANIL) 25 MG tablet Take 1 tablet by mouth at  bedtime  . metoprolol succinate (TOPROL-XL) 25 MG 24 hr tablet Take 1 tablet by mouth  daily  . omeprazole (PRILOSEC) 20 MG capsule Take 20 mg by mouth daily.  Marland Kitchen warfarin (COUMADIN) 5 MG tablet Take 1 tablet (5 mg total) by mouth daily.  Marland Kitchen warfarin (COUMADIN) 6 MG tablet Take 1 tablet by mouth  daily  . [DISCONTINUED] simvastatin (ZOCOR) 40 MG tablet Take 1 tablet by mouth at  bedtime  . butalbital-acetaminophen-caffeine (FIORICET, ESGIC) 50-325-40 MG per tablet Take 1 tablet by mouth as  needed for headache.  Manufacturer recommends no  more than 6 tablets per day  . HYDROcodone-acetaminophen (NORCO/VICODIN) 5-325 MG per tablet Take 1 tablet by mouth every 6 (six) hours as needed.  . valACYclovir (VALTREX) 1000 MG tablet Take 1 tablet (1,000 mg total) by mouth 3 (three) times daily.  Marland Kitchen zoster vaccine live, PF, (ZOSTAVAX) 44514 UNT/0.65ML injection Inject 19,400 Units into the skin once.

## 2013-11-03 LAB — HEPATITIS B SURFACE ANTIBODY,QUALITATIVE: Hep B S Ab: POSITIVE — AB

## 2013-11-03 LAB — HEPATITIS A ANTIBODY, TOTAL: HEP A TOTAL AB: NONREACTIVE

## 2013-11-03 LAB — HEPATITIS C ANTIBODY: HCV Ab: NEGATIVE

## 2013-11-04 ENCOUNTER — Other Ambulatory Visit: Payer: Self-pay | Admitting: *Deleted

## 2013-11-04 MED ORDER — SIMVASTATIN 40 MG PO TABS: ORAL_TABLET | ORAL | Status: DC

## 2013-11-05 ENCOUNTER — Encounter: Payer: Self-pay | Admitting: Internal Medicine

## 2013-11-05 DIAGNOSIS — Z0000 Encounter for general adult medical examination without abnormal findings: Secondary | ICD-10-CM | POA: Insufficient documentation

## 2013-11-05 DIAGNOSIS — Z87828 Personal history of other (healed) physical injury and trauma: Secondary | ICD-10-CM | POA: Insufficient documentation

## 2013-11-05 DIAGNOSIS — Z8619 Personal history of other infectious and parasitic diseases: Secondary | ICD-10-CM | POA: Insufficient documentation

## 2013-11-05 DIAGNOSIS — Z95828 Presence of other vascular implants and grafts: Secondary | ICD-10-CM | POA: Insufficient documentation

## 2013-11-05 DIAGNOSIS — E559 Vitamin D deficiency, unspecified: Secondary | ICD-10-CM | POA: Insufficient documentation

## 2013-11-05 NOTE — Addendum Note (Signed)
Addended by: Crecencio Mc on: 11/05/2013 03:20 PM   Modules accepted: Level of Service

## 2013-11-05 NOTE — Assessment & Plan Note (Signed)
II have addressed  BMI and recommended wt loss of 10% of body weight over the next 6 months using a low glycemic index diet and regular exercise (when her orthopedic issues allow) a minimum of 5 days per week.

## 2013-11-05 NOTE — Assessment & Plan Note (Signed)
By history August 2013,  Conservative management by Eric Form thus far.

## 2013-11-05 NOTE — Assessment & Plan Note (Signed)
LDL and triglycerides are at goal on current medications. She has no side effects and liver enzymes are normal. No changes today .  Lab Results  Component Value Date   CHOL 165 10/27/2012   HDL 48.50 10/27/2012   LDLCALC 87 10/27/2012   LDLDIRECT 118.3 11/02/2013   TRIG 147.0 10/27/2012   CHOLHDL 3 10/27/2012   Lab Results  Component Value Date   ALT 23 11/02/2013   AST 24 11/02/2013   ALKPHOS 114 11/02/2013   BILITOT 0.5 11/02/2013

## 2013-11-05 NOTE — Assessment & Plan Note (Signed)
Addressed previously with weekly megadose for level < 25.  Repeat Vit D level is again low,  Will repeat weekly dose for 3 months, followed by a daily supplement

## 2013-11-05 NOTE — Assessment & Plan Note (Addendum)
Left thoracic wall,  August 2015,  Occurring after steroid intraarticular injection, treated with acyclovir .  Patient to return for Zostavax in January,

## 2013-11-05 NOTE — Assessment & Plan Note (Signed)
Not well controlled on current regimen. Renal function stable, no changes today.   Reviewed list of meds, patient is taking meloxicam  that could be causing,. It.  Have asked patient to recheck bp at home a minimum of 5 times over the next 4 weeks and call readings to office for adjustment of medications.    Lab Results  Component Value Date   CREATININE 1.0 11/02/2013   Lab Results  Component Value Date   NA 143 11/02/2013   K 3.8 11/02/2013   CL 107 11/02/2013   CO2 21 11/02/2013

## 2013-11-05 NOTE — Assessment & Plan Note (Signed)
Coumadin level is therapeutic,  Continue current regimen and repeat PT/INR in one month 

## 2013-11-05 NOTE — Assessment & Plan Note (Addendum)
Diagnosis unclear.  There is n history of hepatic steatosis remarked upon by CT imagine done in 2013 and no abd ultrasound done since then. LFTs are normal.  Dx may have been made by Dr. Vira Agar. Discussed the potential ramifications of diagnosis with patient including management, prognosisand need for vaccination against Hepatitis A and B.  She has vaccination status for Hep b but not Hepatitis A by today's labs.   Lab Results  Component Value Date   ALT 23 11/02/2013   AST 24 11/02/2013   ALKPHOS 114 11/02/2013   BILITOT 0.5 11/02/2013

## 2013-11-05 NOTE — Assessment & Plan Note (Signed)
Annual preventive  exam was done as well as a management of acute and chronic conditions .  During the course of the visit the patient was educated and counseled about appropriate screening and preventive services including : fall prevention , diabetes screening, nutrition counseling, colorectal cancer screening, and recommended immunizations.  Printed recommendations for health maintenance screenings was given.

## 2013-11-05 NOTE — Assessment & Plan Note (Addendum)
Secondary to hypercoagulable state s/p right colectomy May 2013, [place].  No episodes since her surgery

## 2013-11-07 MED ORDER — IMIPRAMINE HCL 25 MG PO TABS: ORAL_TABLET | ORAL | Status: DC

## 2013-11-09 ENCOUNTER — Telehealth: Payer: Self-pay | Admitting: Internal Medicine

## 2013-11-09 MED ORDER — ERGOCALCIFEROL 1.25 MG (50000 UT) PO CAPS: 50000.0000 [IU] | ORAL_CAPSULE | ORAL | Status: DC

## 2013-11-09 NOTE — Telephone Encounter (Signed)
Sent mychart

## 2013-11-09 NOTE — Telephone Encounter (Signed)
According to Lab note sent through My chart patient needs script for Mega dose Vitamin D called to [facility], Patient also wanted to let you know insurance refused her on the 3 D mammogram due to just having one in September, patient will go on medicare in march and they will cover next September.

## 2013-11-09 NOTE — Telephone Encounter (Signed)
done

## 2013-11-10 ENCOUNTER — Telehealth: Payer: Self-pay | Admitting: Internal Medicine

## 2013-11-10 MED ORDER — AMLODIPINE BESYLATE 5 MG PO TABS: 5.0000 mg | ORAL_TABLET | Freq: Every day | ORAL | Status: DC

## 2013-11-10 NOTE — Telephone Encounter (Signed)
Patient stated the only thing she has taken is Aleve for headache today.

## 2013-11-10 NOTE — Telephone Encounter (Signed)
Stop the aleve,  No motrin or advil either, only tylenol or vicodin for pain

## 2013-11-10 NOTE — Telephone Encounter (Signed)
Adding amlodipine 5 mg daily,  rx sent.  Please see if she has been taking any decongestants or nonsteroidals

## 2013-11-10 NOTE — Telephone Encounter (Signed)
Patient notified and voiced understanding.

## 2013-11-10 NOTE — Telephone Encounter (Signed)
Patient called concerned about BP reading for last 24 hours 163/103 11/09/13 AM  Reading HR 91 patient evening BP 189/112 HR 79 patient BP this morning 145/94 HR 91  Patient stated the lowest diastolic has been is 97 and as high as 114. Patient currently takes lasix 20 mg daily and Metoprolol succinate 25 mg daily please advise.

## 2013-11-16 ENCOUNTER — Encounter: Payer: Self-pay | Admitting: Internal Medicine

## 2013-12-06 ENCOUNTER — Other Ambulatory Visit (INDEPENDENT_AMBULATORY_CARE_PROVIDER_SITE_OTHER): Payer: No Typology Code available for payment source

## 2013-12-06 ENCOUNTER — Encounter: Payer: Self-pay | Admitting: Internal Medicine

## 2013-12-06 DIAGNOSIS — Z7901 Long term (current) use of anticoagulants: Secondary | ICD-10-CM

## 2013-12-06 LAB — PROTIME-INR
INR: 3.2 ratio — ABNORMAL HIGH (ref 0.8–1.0)
Prothrombin Time: 34.1 s — ABNORMAL HIGH (ref 9.6–13.1)

## 2013-12-06 NOTE — Addendum Note (Signed)
Addended by: Johnsie Cancel on: 12/06/2013 10:10 AM   Modules accepted: Orders

## 2013-12-21 ENCOUNTER — Other Ambulatory Visit: Payer: Self-pay

## 2013-12-21 MED ORDER — METOPROLOL SUCCINATE ER 25 MG PO TB24: ORAL_TABLET | ORAL | Status: DC

## 2014-01-13 ENCOUNTER — Other Ambulatory Visit: Payer: No Typology Code available for payment source

## 2014-01-16 ENCOUNTER — Other Ambulatory Visit (INDEPENDENT_AMBULATORY_CARE_PROVIDER_SITE_OTHER): Payer: No Typology Code available for payment source

## 2014-01-16 DIAGNOSIS — Z7901 Long term (current) use of anticoagulants: Secondary | ICD-10-CM

## 2014-01-16 LAB — PROTIME-INR
INR: 3.2 ratio — ABNORMAL HIGH (ref 0.8–1.0)
Prothrombin Time: 33.9 s — ABNORMAL HIGH (ref 9.6–13.1)

## 2014-02-17 ENCOUNTER — Other Ambulatory Visit (INDEPENDENT_AMBULATORY_CARE_PROVIDER_SITE_OTHER): Payer: No Typology Code available for payment source

## 2014-02-17 ENCOUNTER — Telehealth: Payer: Self-pay | Admitting: *Deleted

## 2014-02-17 DIAGNOSIS — Z7901 Long term (current) use of anticoagulants: Secondary | ICD-10-CM

## 2014-02-17 LAB — PROTIME-INR
INR: 5.5 ratio — AB (ref 0.8–1.0)
PROTIME: 57.9 s — AB (ref 10.0–13.8)
Prothrombin Time: 57.9 s (ref 9.6–13.1)

## 2014-02-17 LAB — POCT INR: INR: 5.5 — AB (ref <=1.1)

## 2014-02-17 NOTE — Telephone Encounter (Signed)
Patient coumadin regimen is MON, Wed. Fri. 6 mg and Tues. Thurs. Sat, Sun, 5 MG

## 2014-02-17 NOTE — Telephone Encounter (Signed)
Called patient and advised patient with verbal from MD Hold coumadin until Sunday and then resume with 5 mg daily and repeat INR on Thursday. Patient voiced understanding with repeat of directions.

## 2014-02-17 NOTE — Telephone Encounter (Signed)
Critical INR 5.5

## 2014-02-23 ENCOUNTER — Other Ambulatory Visit (INDEPENDENT_AMBULATORY_CARE_PROVIDER_SITE_OTHER): Payer: No Typology Code available for payment source

## 2014-02-23 ENCOUNTER — Other Ambulatory Visit: Payer: Self-pay | Admitting: Internal Medicine

## 2014-02-23 DIAGNOSIS — Z7901 Long term (current) use of anticoagulants: Secondary | ICD-10-CM

## 2014-02-23 LAB — PROTIME-INR
INR: 2.2 ratio — ABNORMAL HIGH (ref 0.8–1.0)
PROTHROMBIN TIME: 23.5 s — AB (ref 9.6–13.1)

## 2014-02-25 ENCOUNTER — Encounter: Payer: Self-pay | Admitting: Internal Medicine

## 2014-02-28 ENCOUNTER — Ambulatory Visit: Payer: Self-pay | Admitting: Ophthalmology

## 2014-03-10 ENCOUNTER — Other Ambulatory Visit (INDEPENDENT_AMBULATORY_CARE_PROVIDER_SITE_OTHER): Payer: PPO

## 2014-03-10 DIAGNOSIS — Z7901 Long term (current) use of anticoagulants: Secondary | ICD-10-CM

## 2014-03-10 LAB — PROTIME-INR
INR: 2.7 ratio — ABNORMAL HIGH (ref 0.8–1.0)
Prothrombin Time: 29.4 s — ABNORMAL HIGH (ref 9.6–13.1)

## 2014-03-11 ENCOUNTER — Encounter: Payer: Self-pay | Admitting: Internal Medicine

## 2014-04-04 ENCOUNTER — Telehealth: Payer: Self-pay | Admitting: Internal Medicine

## 2014-04-05 MED ORDER — BUTALBITAL-APAP-CAFFEINE 50-325-40 MG PO TABS: 1.0000 | ORAL_TABLET | Freq: Four times a day (QID) | ORAL | Status: DC | PRN

## 2014-04-05 NOTE — Telephone Encounter (Signed)
Rx faxed

## 2014-04-05 NOTE — Telephone Encounter (Signed)
Ok to refill,  printed rx  

## 2014-04-11 ENCOUNTER — Other Ambulatory Visit (INDEPENDENT_AMBULATORY_CARE_PROVIDER_SITE_OTHER): Payer: PPO

## 2014-04-11 DIAGNOSIS — Z7901 Long term (current) use of anticoagulants: Secondary | ICD-10-CM | POA: Diagnosis not present

## 2014-04-11 LAB — PROTIME-INR
INR: 2.6 ratio — ABNORMAL HIGH (ref 0.8–1.0)
Prothrombin Time: 28.5 s — ABNORMAL HIGH (ref 9.6–13.1)

## 2014-04-12 ENCOUNTER — Encounter: Payer: Self-pay | Admitting: Internal Medicine

## 2014-04-25 NOTE — Op Note (Signed)
PATIENT NAMESHAKEERA, [name] MR#:  [id number] DATE OF BIRTH:  [birth year]  DATE OF PROCEDURE:  08/12/2011  PREOPERATIVE DIAGNOSES:  1. Colon carcinoma, status post resection.  2. 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   POSTOPERATIVE DIAGNOSES:  1. Colon carcinoma, status post resection.  2. 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   PROCEDURE PERFORMED:  1. Removal of IVC filter.  2. Inferior venacavogram.   SURGEON: Katha Cabal, MD   SEDATION: Versed 2 mg plus fentanyl 50 mcg administered IV. Continuous ECG, pulse oximetry and cardiopulmonary monitoring was performed throughout the entire procedure by the Interventional Radiology nurse. Total sedation time was 1/2 hour.   ACCESS: A 12-French  sheath, right IJ.   FLUOROSCOPY TIME: 2.6 minutes.   CONTRAST USED: Isovue 15 mL.   INDICATIONS: [name] is a 64 year old woman who presented with a significant history of deep venous thrombosis and pulmonary embolus. She was in a hypercoagulable state secondary to colon cancer and was requiring surgery and, therefore, would be off her Coumadin therapy. She underwent placement of an inferior vena caval filter without complication and subsequently has now done well from her surgery, is fully recovered, is back on her anticoagulation, and is undergoing retrieval of her filter. The risks and benefits were reviewed. All questions are answered. The patient agrees to proceed.   DESCRIPTION OF PROCEDURE: The patient is taken to the Special Procedures Suite and placed in the supine position. After adequate sedation is achieved, the right neck is prepped and draped in a sterile fashion. Ultrasound is placed in a sterile sleeve. Ultrasound is utilized secondary to lack of appropriate landmarks and to avoid vascular injury. Under direct ultrasound visualization, the jugular vein is identified. It is compressible, indicating patency. Image is recorded.  With real-time visualization after 1% lidocaine is infiltrated, a Seldinger needle is inserted. A J-wire is advanced with the KMP catheter negotiated into the inferior vena cava. The dilator is passed over the wire, and the 12-French  retrieval sheath is advanced without difficulty, its position at the iliac confluence; and contrast injection is utilized to demonstrate the inferior vena cava filter is in the upright position leaning slightly to the right lateral side. There is no thrombus noted. There are no filling defects observed.   The sheath is repositioned. The snare is introduced, and after several attempts the snare is used to engage the hook. The filter is then collapsed and retrieved without difficulty. There are no immediate complications. The sheath is removed, pressure is held, and the patient tolerated the procedure well.   INTERPRETATION: Initial views demonstrate the filter is in the appropriate position leaning slightly to the right. No filling defects or evidence of thrombus is identified. The vena cava is widely patent. The filter is retrieved without difficulty.   ____________________________ Katha Cabal, MD ggs:cbb D: 08/12/2011 08:35:05 ET T: 08/12/2011 10:53:22 ET JOB#: [id number]  cc: Katha Cabal, MD, <Dictator> Deborra Medina, MD Micheline Maze, MD Katha Cabal MD ELECTRONICALLY SIGNED 08/17/2011 13:21

## 2014-04-30 NOTE — Consult Note (Signed)
Pt still with tenderness over splenic flexure area, descending colon area.  Had filter put in vena cava today with vascular specialist..  Pt in good spirits considering things. Await Dr. Pat Patrick imput.   Electronic Signatures: Manya Silvas (MD)  (Signed on 10-May-13 18:24)  Authored  Last Updated: 10-May-13 18:24 by Manya Silvas (MD)

## 2014-04-30 NOTE — Consult Note (Signed)
Pt well known to me with hx of ischemic colitis on chronic anticoagulation.  Recent attack 45 hours ago similar to other spells but with quicker resolution and absense of bleeding.  She feels it was very similar to other attacks she has had.  She has a trip to [place] planned for next week with her employment.  She is actually on 6mg  a day of coumadin but getting 5mg  here a day but her PT is therapeutic.  Will consider colonoscopy while here, decide tomorrow.  Check stool daily for blood.   Electronic Signatures: Manya Silvas (MD)  (Signed on 29-Apr-13 19:41)  Authored  Last Updated: 29-Apr-13 19:41 by Manya Silvas (MD)

## 2014-04-30 NOTE — Consult Note (Signed)
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 The patient stated that she was also having some episodes of bright red blood per rectum, possibly 3 times total. The patient has had five episodes of diarrhea since last night, mostly brownish. The patient also has had nausea and vomiting, which is also light brown. Of note, the patient was recently admitted and discharged on 05/01 for recurrent symptoms; however, at that time the patient had no blood passage. Flexible sigmoidoscopy did show diverticulosis and internal hemorrhoids and I was called as Dr. Vira Agar was concerned for ischemic colitis.  She has had no fevers and chills today but had some chills last night. She had no fevers prior to that.   PAST MEDICAL HISTORY:  1. 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  2. Hypertension.  3. History of nephrolithiasis.  4. Hyperlipidemia.  5. History of protein C and S deficiency.  6. History of pulmonary embolism.   7. History of irritable bowel syndrome.  8. History of migraines.  9. History of Clostridium difficile.   PAST SURGICAL HISTORY:  1. Status post ureterolithotomy. 2. Upper eyelids bilateral surgery. 3. Hysterectomy. 4. Appendectomy.  5. Breast biopsy.  6. Tubal ligation. 7. Lower lumbar surgery.   Home Medications: Medication Instructions Status  asa baby 1 daily Active  Florastor 250 mg oral capsule 1  orally once a day  Active  vitamin D 1000  Active  imipramine 25 mg oral tablet 1  orally once a day (at bedtime)  Active  alprazolam 0.25 mg oral tablet 1 tab(s) orally  as needed   Active  hydrochlorothiazide-lisinopril 25 mg-20 mg oral tablet 1 tab(s) orally once a day  Active  Coumadin 6 mg oral  tablet 1 tab(s) orally once a day Active  Fioricet 1   once a day, As Needed- for Headache  Active  omeprazole 20 mg oral delayed release capsule 1 cap(s) orally once a day Active  simvastatin 40 mg oral tablet 1 tab(s) orally once a day (at bedtime) Active    NKDA: None  Case History:   Family History Non-Contributory    Social History negative tobacco, negative ETOH, negative Illicit drugs   Review of Systems:   Fever/Chills No    Cough No    Sputum No    Abdominal Pain No    Diarrhea Yes    Constipation No    Nausea/Vomiting Yes    SOB/DOE No    Chest Pain No    Telemetry Reviewed NSR   Physical Exam:   GEN well developed, no acute distress    HEENT PERRL, moist oral mucosa, good dentition    NECK supple  trachea midline    RESP normal resp effort  no use of accessory muscles    CARD regular rate    ABD positive tenderness  distended  hypoactive BS    EXTR negative cyanosis/clubbing, negative edema    SKIN No rashes, No ulcers, skin turgor good    NEURO cranial nerves intact, follows commands, motor/sensory function intact    PSYCH alert, good insight   Nursing/Ancillary Notes: **Vital Signs.:   08-May-13 05:25   Vital Signs Type Routine   Temperature Temperature (F) 97.9   Celsius 36.6   Temperature Source oral  Pulse Pulse 77   Pulse source per Dinamap   Respirations Respirations 18   Systolic BP Systolic BP 893   Diastolic BP (mmHg) Diastolic BP (mmHg) 73   Mean BP 85   BP Source Dinamap   Pulse Ox % Pulse Ox % 92   Pulse Ox Activity Level  At rest   Oxygen Delivery Room Air/ 21 %   Routine Coag:  07-May-13 17:18    Prothrombin 25.4   INR 2.3  Routine Hem:  07-May-13 17:18    WBC (CBC) 5.5   RBC (CBC) 4.55   Hemoglobin (CBC) 13.2   Hematocrit (CBC) 40.1   Platelet Count (CBC) 170   MCV 88   MCH 29.0   MCHC 32.9   RDW 14.3   Neutrophil % 42.0   Lymphocyte % 45.0   Monocyte % 9.2   Eosinophil % 3.0   Basophil % 0.8    Neutrophil # 2.3   Lymphocyte # 2.5   Monocyte # 0.5   Eosinophil # 0.2   Basophil # 0.0  Routine Chem:  07-May-13 17:18    Glucose, Serum 86   BUN 20   Creatinine (comp) 0.93   Sodium, Serum 142   Potassium, Serum 3.6   Chloride, Serum 105   CO2, Serum 31   Calcium (Total), Serum 8.7   Anion Gap 6   Osmolality (calc) 285   eGFR (African American) >60   eGFR (Non-African American) >60  Routine Coag:  07-May-13 17:18    Activated PTT (APTT) 36.0  08-May-13 01:33    Prothrombin 26.3   INR 2.4  Routine Hem:  08-May-13 01:33    WBC (CBC) 5.2   RBC (CBC) 4.32   Hemoglobin (CBC) 12.5   Hematocrit (CBC) 37.7   Platelet Count (CBC) 147   MCV 87   MCH 29.0   MCHC 33.2   RDW 14.4   Neutrophil % 33.6   Lymphocyte % 52.7   Monocyte % 8.3   Eosinophil % 4.1   Basophil % 1.3   Neutrophil # 1.7   Lymphocyte # 2.7   Monocyte # 0.4   Eosinophil # 0.2   Basophil # 0.1  Routine Chem:  08-May-13 01:33    Glucose, Serum 98   BUN 17   Creatinine (comp) 0.89   Sodium, Serum 138   Potassium, Serum 3.2   Chloride, Serum 103   CO2, Serum 28   Calcium (Total), Serum 7.8   Anion Gap 7   Osmolality (calc) 277   eGFR (African American) >60   eGFR (Non-African American) >60  Routine Coag:  08-May-13 04:14    Activated PTT (APTT) > 160.0  Routine Hem:  08-May-13 09:26    Hemoglobin (CBC) 12.4  Routine Coag:  08-May-13 10:38    Activated PTT (APTT) 82.9   Radiology Results: CT:    08-May-13 00:50, CT Abdomen and Pelvis With Contrast   CT Abdomen and Pelvis With Contrast    REASON FOR EXAM:    (1) eval colitis; (2) eval colitis  COMMENTS:       PROCEDURE: CT  - CT ABDOMEN / PELVIS  W  - May 14 2011 12:50AM     RESULT: Comparison:  05/04/2011    Technique: Multiple axial images of the abdomen and pelvis were performed   from the lung bases to the pubic symphysis, with p.o. contrast and with   100 mL of Isovue 370 intravenous contrast.    Findings:  There are  multiple tiny nodules in the posterior right lower lobe which   are nonspecific.    Tiny focus of hyper enhancement in the right hepatic lobe is too small to   characterize. Tiny low-attenuation focus in the right hepatic lobe is too   small to characterize. Similar finding is seen in the left hepatic lobe.   The gallbladder, adrenals, and pancreas are unremarkable. Small   attenuation foci in the spleen are too small to characterize. Small low   attenuation lesion in the left kidney is too small to characterize.    The small and large bowel are normal in caliber. The sigmoid colon is a   relatively decompressed, limiting its evaluation. However, no significant   bowel wall thickening. There is mild diverticulosis of the sigmoid colon.   There is no adjacent inflammatory change. The appendix is not visualized.   However, there are no inflammatory changes in the right lower quadrant.   There is a focal area of eccentric wall thickening along a small bowel   loop in the inferior right hemipelvis, as seen on images 115 through 122.   This is similar to recent prior.  No aggressive lytic or sclerotic osseous lesions are identified.    IMPRESSION:   1. Mild diverticulosis of the sigmoid colon. No definite CT evidence of   colitis.  2. There is a focal loop of eccentric bowel wall thickening of a small   bowel loop in the inferior right hemipelvis, similar to recent prior.   This is nonspecific and may be infectious or inflammatory. Followup CT is   suggested to ensure resolution and exclude other etiologies.  3. Multiple small nodules in the posterior right lower lobe of the   costophrenic angleare nonspecific. Differential would include atypical   infection and aspiration. A dedicated noncontrast chest CT is recommended   in 3 months to ensure resolution.        Verified By: Gregor Hams, M.D., MD     Impression 1.  Ischemic collitis         plan per general surgery         I  have personally reviewed the CT with Dr Register.  There is no evidence of arterial sclerosis or stenosis of the Macro vessels.  This is in accord with the angiogram last year. 2.  Protein C and S; history of DVT and PE         patient should have an IVC filter placed before surgery to prevent leathal PE perioperatively    Plan level3   Electronic Signatures: Hortencia Pilar (MD)  (Signed 10-May-13 07:47)  Authored: General Aspect/Present Illness, Home Medications, Allergies, History and Physical Exam, Vital Signs, Labs, Radiology, Impression/Plan   Last Updated: 10-May-13 07:47 by Hortencia Pilar (MD)

## 2014-04-30 NOTE — Consult Note (Signed)
PATIENT NAMELIYA, [name] MR#:  [id number] DATE OF BIRTH:  [birth year]  DATE OF CONSULTATION:  05/13/2011  CONSULTING PHYSICIAN:  Consuela Mimes, MD  REASON FOR CONSULTATION: Recurrent ischemic colitis.   HISTORY OF PRESENT ILLNESS: [name] is a 64 year old white female who had her first episode of ischemic colitis in July of 2011. She had three other episodes during that calendar year. She had one episode during 2012 where she was hospitalized at [facility], and she had a recent hospitalization here at [facility] where she was discharged six days ago with ischemic colitis. She has had what she calls little attacks of left lower quadrant and lower abdominal pain that have been treated without hospitalization as well. Typically, she has between 3 and 7 bowel movements per day. She does have some fecal incontinence because of surprise and the liquid character of her stool. This frequency of bowel movements is highly variable.  She has had bloody diarrhea in the past but is not having that during this current episode. She has had some chills but no fevers.She has had one episode of documented C. difficile colitis with her original hospitalization in July of 2011, but since that time she has not had C. difficile. She underwent flexible sigmoidoscopy today and was found to have scattered patchy areas of superficial ulcerated mucosa in the rectosigmoid colon as well as the descending colon. She underwent a superior mesenteric artery and inferior mesenteric artery selective arteriography by Dr. Delana Meyer during her previous hospitalization almost one year ago on 05/08/2010. The findings were rapid filling of the celiac and superior mesenteric arteries with some distal pruning of the superior mesenteric artery. Her selective injection of her inferior mesenteric artery showed this to be very, very small (1.5 mm in diameter); and there was severe distal pruning and absolutely no evidence of mucosal blushing in the  distribution of the inferior mesenteric artery. She has had a complete colonoscopy and has had minimal size polyps in the right colon with no significant pathology. Last CT scan of the abdomen and pelvis with contrast was performed 10 days ago, and this showed that the distal half of the transverse colon, descending colon, and sigmoid colon were decompressed, limiting evaluation. The only CT scan I can find in her electronic health record with demonstrable pathology of the colon is dated 08/02/2009, which was her original episode. She had diffuse thickening in the wall in the descending colon at that time.   PAST MEDICAL HISTORY:  1. History of protein C deficiency with  2.   History of pulmonary embolism.  3. Dyslipidemia.  4. Hypertension.  5. Migraine headaches.   PAST SURGICAL HISTORY:  1. Status post ureterolithotomy. 2. Status post bilateral eyelid surgery.  3. Status post hysterectomy.  4. Status post appendectomy. 5. Status post breast biopsy. 6. Status post bilateral tubal ligation.   ALLERGIES: None.   MEDICATIONS:  1. Alprazolam 0.25 mg p.r.n.  2. Aspirin 81 mg daily.  3. Coumadin 6 mg daily.  4. Fioricet 1 daily p.r.n. headache. 5. Florastor 250 mg capsule daily.  6. Hydrochlorothiazide 25 mg daily.  7. Lisinopril 20 mg daily.  8. Imipramine 25 mg at bedtime.  9. Omeprazole 20 mg daily.  10. Simvastatin 40 mg at bedtime.  11. Vitamin D 1000 units daily.   FAMILY HISTORY: Positive for other family members with protein C deficiency.   SOCIAL HISTORY: The patient is married, works at [employer] and lives in [place]. She does not smoke cigarettes or  use alcohol.   REVIEW OF SYSTEMS: Negative for 10 systems except the gastrointestinal system as mentioned above in the history of present illness.   PHYSICAL EXAMINATION:  GENERAL: A pleasant, middle-aged female in no acute distress.   VITAL SIGNS: Height 5 feet 1 inch, weight 161 pounds. BMI 30.5. Temperature 98.6, pulse  76, respirations 18, blood pressure 137/68, oxygen saturation 92% on room air.   HEENT: Pupils are equally round and reactive to light. Extraocular movements are intact. Sclerae are anicteric. Oropharynx is clear.  NECK: Supple with no thyroid enlargement. The trachea is midline, and there is no jugular venous distention.   HEART: Regular rate and rhythm with no murmurs or rubs.   LUNGS: Clear to auscultation with normal respiratory effort bilaterally.   ABDOMEN: Soft with some tenderness in the left lower quadrant but no rebound or guarding.   EXTREMITIES: No edema with normal capillary refill bilaterally.   NEUROLOGIC: Cranial nerves II through XII, motor and sensation grossly intact.   PSYCHIATRIC: Alert and oriented x4. Appropriate affect.   LABORATORY, DIAGNOSTIC AND RADIOLOGICAL DATA:  Lactate level yesterday 2.7. Her electrolytes are normal. Her lipase and calcium are normal. Her hepatic profile is normal.  Her white blood cell count is 5.5, hemoglobin 13.2, hematocrit 40%, platelet count 170,000. PT/INR ranges from 25 to 32 and 2.3 to 3.1, respectively. PTT 36.   ASSESSMENT: I had a long discussion with the patient regarding her recurrent ischemic colitis, and given the fact that the splenic flexure is typically a watershed area, and she has such a small inferior mesenteric artery, she would likely need a left hemicolectomy to include the rectosigmoid junction and splenic flexure. If her transverse colon could not reach her pelvis due to a short middle colic artery, she may require a little bit more transverse colon removed and essentially have a proximal transverse coloproctostomy. This may leave her with at least three fairly liquid bowel movements per day, but that is no worse than what she currently experiences. She may require some Imodium, Lomotil, and even possibly Questran postoperatively in order to decrease the frequency of her bowel movements. She understands that there is a  2% risk of anastomotic leak and that if she is, in fact, not made better with the surgery, she has a finite risk of colostomy. She inquired about the timing of surgery, and I explained to her that there was nothing urgent about planning this operation; but she is quite frustrated with her multiple recurrences and is interested in having this surgery done during this current hospitalization. I discussed the logistics of our practice and how such an operation would not be undertaken during the night shift; and since she will require at least a partial reversal of her anticoagulation and a bowel preparation, in all likelihood her surgery would be performed by my senior partner, Dr. Bronson Ing. She recognized Dr. Rolin Barry name and seemed interested in having him perform the surgery but also understands that he would have to perform his own evaluation and concur with my recommendations. Thus, in all likelihood her surgery would not occur until the the very end of this week or first part of next week. She also has a significant concern regarding the toll that this is taking on her husband; and, therefore, I plan to visit with her again tomorrow night when her husband is present before formulating a more definitive plan.  ____________________________ Consuela Mimes, MD wfm:cbb D: 05/13/2011 23:29:55 ET T: 05/14/2011 11:09:13 ET JOB#: [id number]  cc: Consuela Mimes, MD, <Dictator> Deborra Medina, MD Manya Silvas, MD Consuela Mimes MD ELECTRONICALLY SIGNED 05/14/2011 22:09

## 2014-04-30 NOTE — Consult Note (Signed)
Pt with unilateral swelling of left leg last week, some discomfort, now minimal discomfort with dorsiflexion of leg.  Low probability but will order [place] of lower extremity tomorrow.  Electronic Signatures: Manya Silvas (MD)  (Signed on 29-Apr-13 19:47)  Authored  Last Updated: 29-Apr-13 19:47 by Manya Silvas (MD)

## 2014-04-30 NOTE — Consult Note (Signed)
VSS afebrile, PTT good range.  Abd less painful, less tender.  She is to see Dr. Pat Patrick when he returns for possible colon surgery.  Dr. Delana Meyer came by and talked about a possible VC filter which may be helpful in the recovery phase after surgery, it could be removed later.  No bleeding today.    Electronic Signatures: Manya Silvas (MD)  (Signed on 09-May-13 18:06)  Authored  Last Updated: 09-May-13 18:06 by Manya Silvas (MD)

## 2014-04-30 NOTE — Discharge Summary (Signed)
PATIENT NAMEBEV, [name] MR#:  [id number] DATE OF BIRTH:  1950-12-29  DATE OF ADMISSION:  05/04/2011 DATE OF DISCHARGE:  05/07/2011  ADMITTING DIAGNOSES: Abdominal pain, nausea, vomiting and diarrhea.   DISCHARGE DIAGNOSES:  1. Abdominal pain, nausea, vomiting likely related to recurrent colitis. Stool studies negative status post gastrointestinal evaluation, symptoms improved.  2. History of pulmonary embolus with history of protein central nervous system deficiency. Patient on chronic anticoagulation.  3. Hypertension.  4. Hyperlipidemia.  5. Migraine headaches   CONSULTANTS:  1. Dr. Dionne Milo. 2. Dr. Vira Agar.   LABORATORY, DIAGNOSTIC AND RADIOLOGICAL DATA: Admitting BMP: Glucose 96, BUN 22, creatinine 1.23, sodium 143, potassium 3.8, chloride 105, CO2 30, calcium 8.8, lipase 170. LFTs were normal. WBC 13.0, hemoglobin 14.2, platelet count 184, INR 2.2. Stool cultures negative for salmonella, Campylobacter or any other pathogens. Urinalysis nitrites negative, leukocytes negative. CT scan of the abdomen showed no acute abnormality. Distal half of the transverse colon, descending colon and sigmoid colon are decompressed.   HOSPITAL COURSE: Please see history and physical done by me on presentation. Patient is a 64 year old white female with history of recurrent colitis felt to be due to ischemic colitis, irritable bowel syndrome, history of protein central nervous system deficiency on chronic anticoagulation presents with complaint of abdominal pain, nausea, vomiting and diarrhea similar to her previous presentation. Patient has had mesenteric angiography last year which shows distal IMA disease causing possibly recurrent ischemic colitis. Patient presented with these symptoms. Had a CT scan which was nonrevealing as her previous admissions. Patient was admitted and pain was controlled, was placed on empiric antibiotics. Stool cultures were sent. She was seen in consultation by GI who felt that  her presentation again is likely due to her previous presentations. They recommended continuing current treatment. Patient slowly started improving. She is tolerating regular diet and has been okayed to be discharged by GI. At this time she is stable for discharge.   DISCHARGE MEDICATIONS:  1. Aspirin 81 mg 1 tab p.o. daily.  2. Florastor 250 mg daily.  3. Vitamin D 1000 international units daily.  4. Imipramine 25, 1 tab p.o. at bedtime.  5. Alprazolam 0.25, 1 tab p.o. daily as needed.  6. Hydrochlorothiazide/lisinopril 25/20 daily.  7. Coumadin to be resumed as taking previously. 8. Fioricet 1 tab p.o. daily as needed for headache.  9. Omeprazole 20 daily.  10. Simvastatin 40 mg at bedtime.   HOME OXYGEN: None.   DIET: Low sodium, low fat, low residual.   ACTIVITY: As tolerated.   FOLLOW UP: Follow up with Dr. Derrel Nip in 1 to 2 weeks, Dr. Vira Agar in two weeks.   TIME SPENT: 35 minutes.   ____________________________ Lafonda Mosses Posey Pronto, MD shp:cms D: 05/07/2011 10:29:17 ET T: 05/08/2011 11:43:49 ET JOB#: [id number]  cc: Emma Fanguy H. Posey Pronto, MD, <Dictator> Emma Medina, MD Emma Silvas, MD Alric Seton MD ELECTRONICALLY SIGNED 05/09/2011 15:25

## 2014-04-30 NOTE — Consult Note (Signed)
Pt well known to me with recurrent spells of ischemic colitis.  Pt with acute onset of pain and bleeding.  Flex sig done today showed splotches of typical broad mucosal  erosion with some areas of erythematous grannular type mucosa.  Pt admitted by Hospitalist.  Will consider surgery as an alternative to recurrent episodic attacks.  Bx not done today due to being on coumadin at full dose.  Electronic Signatures: Manya Silvas (MD)  (Signed on 07-May-13 18:39)  Authored  Last Updated: 07-May-13 18:39 by Manya Silvas (MD)

## 2014-04-30 NOTE — Op Note (Signed)
PATIENT NAMETAWANNA, [name] MR#:  [id number] DATE OF BIRTH:  [birth year]  DATE OF PROCEDURE:  05/16/2011  PREOPERATIVE DIAGNOSES:  1. History of deep venous thrombosis with pulmonary embolism.  2. Hypercoagulable state with protein C and protein S deficiency.  3. Ischemic colitis requiring colectomy.   POSTOPERATIVE DIAGNOSES:  1. History of deep venous thrombosis with pulmonary embolism.  2. Hypercoagulable state with protein C and protein S deficiency.  3. Ischemic colitis requiring colectomy.   PROCEDURE PERFORMED:  1. Inferior venacavogram.  2. Placement of infrarenal inferior vena caval filter, Meridian type.   SURGEON: Katha Cabal, M.D.   SEDATION: Versed 3 mg IV.   CONTRAST USED: Isovue 20 mL.   FLUORO TIME: Less than one minute.   INDICATIONS: Ms. Juma is a 64 year old woman who will require significant surgical intervention to treat her ischemic colitis. She has known hypercoagulable state and will require being off her anticoagulation. She also has a history of deep venous thrombosis with pulmonary embolism. She is therefore undergoing placement of a retrievable IVC filter prior to her surgery to prevent lethal pulmonary embolism in the perioperative period. Risks and benefits were reviewed. All questions are answered. The patient agrees to proceed.   DESCRIPTION OF PROCEDURE: The patient is taken to Special Procedures and placed in the supine position. After adequate sedation, the patient's right groin is prepped and draped in sterile fashion. Ultrasound is placed in a sterile sleeve. Ultrasound is utilized secondary to lack of appropriate landmarks to avoid vascular injury. Image is recorded for the permanent record. Under direct ultrasound visualization the femoral vein is identified. It is echolucent, homogeneous, and easily compressible. Under real-time visualization, a Seldinger needle is inserted. J-wire is advanced. Dilator is then passed over the wire and a bolus  injection of contrast is used to demonstrate the inferior vena cava. Filling defect is noted just above the confluence of the iliac veins on the medial wall.  The immediate infrarenal portion is free of any filling defects. It measures approximately 22 mm in diameter. Meridian filter is then advanced up to this clear area just below the renal blushes and deployed without difficulty. There are no immediate complications.   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 The patient has been on anticoagulation now for almost one year and this is likely to be well organized. Filter is deployed above this, just below the level of the renal veins.       ____________________________ Katha Cabal, MD ggs:bjt D: 05/16/2011 10:34:56 ET T: 05/16/2011 12:39:00 ET JOB#: [id number]  cc: Katha Cabal, MD, <Dictator> Manya Silvas, MD Rodena Goldmann III, MD Deborra Medina, MD Katha Cabal MD ELECTRONICALLY SIGNED 05/26/2011 17:41

## 2014-04-30 NOTE — Consult Note (Signed)
Brief Consult Note: Diagnosis: ? Colitis.   Patient was seen by consultant.   Comments: Patient with recurrent abdominal pain and vomiting admited with LLQ pain and vomiting. Patient is well knwo to Dr. Vira Agar and would like to see him while in the hospital. Mild leucocytosis. Mild abdominal tenderness without rebound. Abdominal CT negative for any significant acute intra-abdominal process.  Agree with IV hydration and IV antibiotics. Will request Dr. Vira Agar to follow her. Thanks.  Electronic Signatures: Jill Side (MD)  (Signed 28-Apr-13 15:40)  Authored: Brief Consult Note   Last Updated: 28-Apr-13 15:40 by Jill Side (MD)

## 2014-04-30 NOTE — H&P (Signed)
PATIENT NAMELAKRISTA, [name] MR#:  [id number] DATE OF BIRTH:  Dec 07, 1950  DATE OF ADMISSION:  05/04/2011  PRIMARY CARE PHYSICIAN: Deborra Medina, MD  PRIMARY GASTROENTEROLOGIST: Gaylyn Cheers, MD  ER REFERRING PHYSICIAN: Ferman Hamming, MD  CHIEF COMPLAINT: Abdominal pain in the left lower mid epigastric region.   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 The patient was initially hospitalized in July 2011 with C. difficile colitis and was also thought to have concurrent ischemic colitis. Subsequently, she required a few more admissions since then for similar issues. 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 The patient reports that she does have chronic intermittent abdominal discomfort, but it is not as severe this morning. Yesterday around 10:30 p.m. she started having sharp left quadrant/midepigastric abdominal pain radiating to her back. She describes the pain as 8 out of 10 similar to her previous episode. She also had sweating associated with that and she started having diarrhea. Unlike her previous presentation, she did not have bloody diarrhea. She had some chills but no fevers. She denies any recent antibiotic usage. She also had some emesis. She reports that she did have some shortness of breath when the symptoms started. She did have some chest pain also associated with it, but now the chest pain is resolved. She otherwise denies any urinary symptoms. No recent antibiotics. No unusual food consumption. No recent travel.  PAST MEDICAL HISTORY:  1. 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  2. Hypertension.  3. History of  nephrolithiasis.  4. Hyperlipidemia.  5. History of protein C and S deficiency.  6. History of pulmonary embolism.  7. History of irritable bowel syndrome.  8. Migraine headaches.  9. History of C. difficile colitis.   PAST SURGICAL HISTORY:  1. Status post ureterolithotomy. 2. Upper eyelid bilateral surgery. 3. Status post hysterectomy. 4. Status post appendectomy. 5. Status post breast biopsy. 6. Status post tuba ligation. 7. Status post lower lumbar surgery.   ALLERGIES: No known drug allergies.   CURRENT MEDICATIONS:  1. Alprazolam 0.25 mg 1 tab p.o. daily as needed.  2. Aspirin 81 mg 1 tab p.o. daily.  3. Coumadin 5 mg at bedtime.  She is to take Wednesdays and Saturdays 10 mg. 4. Fioricet as needed. 5. Florastor 250 mg daily.  6. Hydrochlorothiazide/lisinopril 25/20 mg one tab p.o. daily.  7. Imipramine 25 mg at bedtime.  8. Omeprazole 40 mg daily.  9. Simvastatin 20 mg daily.  10. Vitamin D 1000 international units daily.   FAMILY HISTORY: Father died of coronary artery disease and myocardial infarction in his 64s. Mother is living with hypertension. Father also had hypertension.   SOCIAL HISTORY: She lives in [place]. She denies smoking, alcohol, or drug use. Currently works at [employer]: CONSTITUTIONAL: Denies any fevers. Complains of fatigue, weakness, and abdominal pain as above. No weight loss. No weight gain. EYES: No blurred or double vision. No pain. No redness. No inflammation. No glaucoma. No cataracts. ENT: No tinnitus. No ear pain. No hearing loss. No seasonal or year-round allergies. No epistaxis. No difficulty swallowing. RESPIRATORY: Complains of some shortness of breath. No cough. No wheezing. No hemoptysis.  No asthma. No chronic obstructive pulmonary disease. No tuberculosis. CARDIOVASCULAR: Had brief episode of chest pain. No orthopnea. No edema. No arrhythmias. No palpitations. Has high blood pressure. GI: Nausea, vomiting, and  diarrhea as above. Also abdominal pain as above. No hematemesis. No melena. No jaundice. No rectal bleeding. GU: Denies any dysuria, hematuria, renal calculus, or frequency. ENDOCRINE: Denies any polydipsia, nocturia, or thyroid problems. No increase in sweating, heat or cold intolerance. HEME/LYMPH: Denies anemia, easy bruisability, or bleeding. Does have protein C and S deficiency. SKIN: No acne. No rash. No changes mole, hair or skin. MUSCULOSKELETAL: No pain in the neck, back, or shoulder. NEURO: No numbness. No cerebrovascular accident. No transient ischemic attack. No seizures. PSYCHIATRIC: No anxiety. No insomnia. No ADD. No OCD.   PHYSICAL EXAMINATION:   VITAL SIGNS: Temperature 97.5, pulse 74, respirations 18, blood pressure 103/56, and O2 96%.   GENERAL: The patient is a 64 year old white female in no acute distress, well-developed and well-nourished.   HEENT: Head atraumatic, normocephalic. Pupils are equal, round, and reactive to light and accommodation. Extraocular movements intact. There is no conjunctival pallor. No scleral icterus. Oropharynx is clear without any exudates. External ear exam shows no erythema. Nasal exam shows no ulceration or drainage.   NECK: No thyromegaly. No carotid bruits.  CARDIOVASCULAR: Regular rate and rhythm. No murmurs, rubs, clicks, or gallops. PMI is not displaced.   LUNGS: Clear to auscultation bilaterally without any rales, rhonchi, or wheezing.  ABDOMEN: Left lower quadrant tenderness without any guarding. No rebound. Positive bowel sounds x4. No hepatosplenomegaly.   EXTREMITIES: No clubbing, cyanosis, or edema.   SKIN: No rash.   LYMPHATICS: No lymph nodes palpable.   VASCULAR: Good DP and PT pulses.   PSYCHIATRIC: Not anxious or depressed.   NEUROLOGICAL: Awake, alert, and oriented x3. No focal deficits.  LABS/STUDIES: BMP: Glucose 96, BUN 22, creatinine 1.23, sodium 143, potassium 3.8, chloride 105, and CO2 30. LFTs were normal. WBC  13.0, hemoglobin 14.2, and platelet count 184. INR 2.2.   Urinalysis was nitrite negative and leukocyte negative.   CT scan of the abdomen shows no acute abnormality or pelvic pathology noted. The distal half of the transverse colon, descending colon, and sigmoid colon are decompressed limiting evaluation.   ASSESSMENT AND PLAN: The patient is a 64 year old white female with recurrent history of colitis, history of PE, and hypertension who presents with abdominal pain, nausea, and diarrhea.  1. Abdominal pain, nausea, and vomiting likely related to recurrent colitis, possible ischemic. We will provide her with supportive care and pain control. We will check her stools for Clostridium difficile and cultures. We will place her on empiric Cipro and Flagyl in light of her WBC count being elevated. We will ask Dr. Vira Agar to come see the patient. He knows her GI history very well.  2. History of PE with protein C deficiency. We will continue Coumadin. Follow INRs.  3. Hypertension. We will continue lisinopril and hold HCTZ in light of her blood pressure being normal.  4. Hyperlipidemia. We will continue simvastatin.  5. Migraines. Continue Fioricet and advance as needed.            6. Miscellaneous. The patient is on therapeutic Coumadin so that should cover for deep vein thrombosis prophylaxis.   TIME SPENT: 35 minutes. ____________________________ Lafonda Mosses Posey Pronto, MD shp:slb D: 05/04/2011 11:23:47 ET T: 05/04/2011 11:40:30 ET JOB#: [id number]  cc: Branna Cortina H. Posey Pronto, MD, <Dictator> Deborra Medina, MD Alric Seton MD ELECTRONICALLY SIGNED 05/05/2011 13:17

## 2014-04-30 NOTE — Consult Note (Signed)
Pt still with minimal tenderness, stool was heme neg.  PT of 2.8 INR, Doppler was neg for her legs.  She has decided not to travel to [facility] next week.  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  I think she could go home tomorrow if she continues to do well.  She ate regular food tonight and did OK.  Has appt to see me in 2 weeks if needed.  Electronic Signatures: Manya Silvas (MD)  (Signed on 30-Apr-13 18:37)  Authored  Last Updated: 30-Apr-13 18:37 by Manya Silvas (MD)

## 2014-04-30 NOTE — H&P (Signed)
PATIENT NAMEMERILEE, Giles MR#:  [id number] DATE OF BIRTH:  Apr 24, 1950  DATE OF ADMISSION:  05/13/2011  REFERRING PHYSICIAN:  Dr. Gaylyn Cheers  PRIMARY CARE PHYSICIAN:  Dr. Derrel Nip  CHIEF COMPLAINT: Abdominal pain and possible ischemic colitis.   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 The patient stated that she was also having some episodes of bright red blood per rectum, possibly 3 times total. The patient has had five episodes of diarrhea since last night, mostly brownish. The patient also has had nausea and vomiting, which is also light brown. Of note, the patient was recently admitted and discharged on 05/01 for recurrent symptoms; however, at that time the patient had no blood passage. Given the above, the patient had called her gastroenterologist who did some lab work and x-ray of the abdomen and then referred the patient here for urgent flexible sigmoidoscopy. Flexible sigmoidoscopy did show diverticulosis and internal hemorrhoids and I was called as Dr. Vira Agar was concerned for ischemic colitis.  Hospital admission was recommended. The patient has had no sick contacts, however, was on Cipro and Flagyl in the last hospitalization. She has had no fevers and chills today but had some chills last night. She had no fevers prior to that.   PAST MEDICAL HISTORY:  1. 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  2. Hypertension.  3. History of nephrolithiasis.  4. Hyperlipidemia.  5. History of protein C and S deficiency.  6. History of pulmonary embolism.   7. History of irritable bowel syndrome.  8. History of migraines.  9. History of Clostridium difficile.   PAST SURGICAL HISTORY:  1. Status post ureterolithotomy. 2. Upper  eyelids bilateral surgery. 3. Hysterectomy. 4. Appendectomy.  5. Breast biopsy.  6. Tubal ligation. 7. Lower lumbar surgery.   ALLERGIES: No known drug allergies.   MEDICATIONS:  1. Alprazolam 0.25 mg daily as needed.  2. Aspirin 81 mg daily.  3. Coumadin 6 mg daily.  4. Fioricet as needed. 5. Florastor 250 mg daily.  6. Hydrochlorothiazide/lisinopril 25/20 mg, 1 tab daily. 7. Imipramine 25 mg at bedtime.  8. Omeprazole 40 mg daily.  9. Simvastatin 20 mg daily.  10. Vitamin D 1000 international units daily.   FAMILY HISTORY: Father with coronary artery disease and myocardial infarction. Mom with hypertension. Dad also with hypertension.   SOCIAL HISTORY: Lives in [place]. [employer] employee. Denies tobacco, alcohol, or drug use. Married.     REVIEW OF SYSTEMS: CONSTITUTIONAL: No fever but did have some weakness and diaphoresis. Abdominal pain as above. No weight changes. EYES: No blurry vision or double vision. ENT: No tinnitus or hearing loss. RESPIRATORY: No cough or wheezing, shortness of breath, hemoptysis, or asthma. CARDIOVASCULAR: No chest pain, orthopnea, edema, or arrhythmias.  GI: Nausea, vomiting, and diarrhea as above. Also left lower quadrant abdominal pain, which sometimes shoots to the back. Also had three episodes of bright red blood per rectum since last night. GU: Denies dysuria, hematuria, or frequency. ENDOCRINE: Denies polyuria, nocturia, or thyroid problems. HEME/LYMPH: No easy bruising. Bleeding as above. Does have protein C and S deficiency. SKIN: No new rashes. MUSCULOSKELETAL: Denies any gout.  NEUROLOGIC: No numbness or weakness. PSYCH: No anxiety or depression.    PHYSICAL EXAMINATION:  VITAL SIGNS: Currently temperature is 97.4, respiratory rate 17, blood  pressure 116/53, oxygen sats 96% on room air, heart rate 74.   GENERAL: The patient is a Caucasian female laying in bed in mild distress.   HEENT: Normocephalic and atraumatic. Pupils are equal and  reactive. Anicteric sclerae. Moist mucous membranes. Extraocular muscles intact.   NECK: Supple. No thyroid tenderness.  CARDIOVASCULAR:  S1, S2, regular rate and rhythm. No murmurs, rubs, or gallops.   LUNGS: Clear to auscultation without wheezing, crackles, or rhonchi.   ABDOMEN: Soft, positive tenderness in the left lower quadrant. No guarding or rebound. Hyperactive bowel sounds in all quadrants, more on the left.   EXTREMITIES: No significant edema.   NEURO:  Cranial nerves III-XII grossly intact.  Strength 5/5 in all extremities.   PSYCH: Awake, alert, oriented times three.  LABORATORY DATA: No labs done here while the patient was in the endoscopy suite. Flexible sigmoidoscopy shows no acute bleeding per Dr. Vira Agar; however, some redness was noted.  Positive internal hemorrhoids and diverticulosis and probably ischemic colitis.   ASSESSMENT AND PLAN: We have a 64 year old Caucasian female with possible ischemic colitis, which is recurrent in nature, hypertension, pulmonary embolism, in the setting of protein C and S deficiency on chronic Coumadin who presents with another episode of nausea, vomiting, diarrhea, and abdominal pain with flexible sigmoidoscopy done today showing probable ischemic colitis.  I was called by Dr. Vira Agar to admit the patient at this point for ischemic colitis. The patient has had multiple episodes of this in the past few years and hospitalizations in the past as well. At this point we will hold Coumadin and start the patient on heparin and obtain a stat INR. Also check hemoglobin q. 8 hours. I will hold her blood pressure medicines as blood pressure is on the lower side and start the patient on gentle fluids as well. I will obtain a vascular surgical consult. I will obtain a CT of the abdomen and pelvis with and without contrast for further evaluation. The patient also did receive some antibiotics and I will check stool cultures as well as Clostridium difficile.  There are no current labs.  I will check a CBC stat and then trend hemoglobins as well.  I will hold the aspirin for now and, again, hold the Coumadin and start heparin drip. In regard to hyperlipidemia I will resume the statin for now.   The above was discussed with the patient's husband and the patient as well as Dr. Vira Agar.  CODE STATUS: FULL CODE.   TOTAL TIME SPENT:  45 minutes.    ____________________________ Vivien Presto, MD sa:bjt D: 05/13/2011 17:22:45 ET T: 05/14/2011 06:46:00 ET JOB#: [id number]  cc: Vivien Presto, MD, <Dictator> Manya Silvas, MD Deborra Medina, MD Vivien Presto MD ELECTRONICALLY SIGNED 06/13/2011 12:34

## 2014-04-30 NOTE — Consult Note (Signed)
Pt CT did not show colonic inflammation, the inflammation was mucosal and splotchy.  Unusual report of small bowel loop showing thichening.    Electronic Signatures: Manya Silvas (MD)  (Signed on 08-May-13 08:57)  Authored  Last Updated: 08-May-13 08:57 by Manya Silvas (MD)

## 2014-04-30 NOTE — Consult Note (Signed)
Brief Consult Note: Diagnosis: recurrent ischemic colitis due to diminutive IMA.   Patient was seen by consultant.   Consult note dictated.   Comments: Will see again tomorrow PM when husband can be present. If sh has surgery during this admission it will likely be done by my partner, Dr Pat Patrick next week as she needs to have her anti-coagulation partially reversed and a bowel prep.  Electronic Signatures: Consuela Mimes (MD)  (Signed [phone] 23:31)  Authored: Brief Consult Note   Last Updated: 07-May-13 23:31 by Consuela Mimes (MD)

## 2014-04-30 NOTE — Discharge Summary (Signed)
PATIENT NAMEJALISIA, [name] MR#:  [id number] DATE OF BIRTH:  02-15-1950  DATE OF ADMISSION:  05/13/2011 DATE OF DISCHARGE:  05/30/2011  THIS DISCHARGE SUMMARY COVERS THE PATIENT'S HOSPITAL COURSE FROM 05/18 TO 05/24. FOR PRIOR INFORMATION PLEASE SEE THE DISCHARGE SUMMARY DATED 05/23/2011.    DIAGNOSES:  1. Recurrent ischemic colitis status post left colectomy. 2. Acute hypoxic respiratory failure possibly due to aspiration pneumonia, completed antibiotics.  3. Protein C deficiency.  4. Hypertension.  5. Hyperlipidemia.  6. History of migraines. 7. Anxiety. 8. Anemia. 9. Thrombocytopenia.  10. Hypokalemia.  11. Multifactorial acute failure.   DISPOSITION: Patient is being discharged home.   FOLLOW UP: Follow up with Dr. Leanora Cover 1 to 2 days after discharge. Follow up with Dr. Delana Meyer and Dr. Derrel Nip in 1 to 2 weeks after discharge. Patient needs a PT-INR check-up in 2 to 3 days.   DIET: Low sodium.   ACTIVITY: As tolerated.   DISCHARGE MEDICATIONS:  1. Coumadin 5 mg daily.  2. Zocor 20 mg at bedtime.  3. Zoloft 50 mg at bedtime.  4. Oxycodone 5 mg q.4 hours p.r.n. pain. 5. Tramadol 50 mg q.6 hours p.r.n. pain. 6. Omeprazole 20 mg b.i.d.  7. Lopressor 50 mg b.i.d.  8. Fentanyl patch 12 mcg q.72 hours.   LABORATORY, DIAGNOSTIC AND RADIOLOGICAL DATA: KUB showed no acute abnormalities. White count 11.4 to 9.6. Hemoglobin ranging from 11 to 8.5, platelets 134. Hypokalemia which was supplemented. Creatinine 2.95 to 2.46.   HOSPITAL COURSE: Patient is a 64 year old female with past medical history of protein C deficiency and pulmonary embolus, recurrent colitis, irritable bowel syndrome, hypertension who presented with abdominal pain, nausea, and diarrhea and bleeding per rectum.  1. Recurrent ischemic colitis with abdominal pain, nausea and vomiting. Patient underwent colectomy on 05/13. She continued to have abdominal pain therefore a repeat KUB was done on 05/25/2011 which showed no  abnormalities. Patient was started on Pepcid and omeprazole which resulted in improvement in her nausea and regurgitation symptoms. Gradually her diet was increased from clear liquids to regular diet which she is tolerating at present without any difficulty.  2. Acute hypoxic respiratory failure on 05/23/2011 possibly due to aspiration pneumonia. Patient was hypoxic and lethargic after getting narcotic analgesics and PCA which was started when her epidural was discontinued. A CAT scan showed no evidence of PE but it did show basilar infiltrates. Patient was empirically treated with vancomycin and Zosyn. Patient has completed antibiotic therapy. She is currently afebrile with normal white count off any antibiotics.  3. History of pulmonary embolism/protein C deficiency. Patient has an IVC filter. She was noted to have an IVC thrombus and the filter was placed superior to it. Patient was on a heparin drip until her Coumadin became therapeutic. The dose of her Coumadin currently is 6 mg. She will need a PT-INR in 2 to 3 days.  4. Hypertension. Remained stable during the hospitalization.  5. Hypertension. Patient was treated with simvastatin.  6. History of migraines. Remained stable. Patient had no attacks during the hospitalization.  7. Anemia, thrombocytopenia. Patient had become anemic and thrombocytopenic after her surgery but they remained stable.  8. Electrolyte abnormalities with hypokalemia, was supplemented.  9. Acute renal failure. Patient developed acute renal failure which was felt to be multifactorial, possibly due to the use of vancomycin, variable oral intake and multiple bowel movements on a clear liquid diet. Patient's ACE inhibitor was placed on hold. She was hydrated with IV fluids which resulted in slow  improvement in her creatinine. She had good urine output. Patient has been advised to drink plenty of fluids and stay hydrated and avoid sweetened carbonated drinks.   CONDITION: She is  being discharged home in a stable condition.   DISCHARGE INSTRUCTIONS: She will follow up with Dr. Derrel Nip, Dr. Delana Meyer and Dr. Leanora Cover upon discharge from the hospital.   TIME SPENT: 45 minutes.   ____________________________ Emma Huger, MD sp:cms D: 05/30/2011 14:50:18 ET T: 06/02/2011 11:02:24 ET JOB#: [id number]  cc: Emma Huger, MD, <Dictator> Deborra Medina, MD Emma Huger MD ELECTRONICALLY SIGNED 06/03/2011 12:20

## 2014-04-30 NOTE — Consult Note (Signed)
Conf with Dr. Pat Patrick, spoke to family, abd less tender today.  No bleeding.  I feel that unless she has surgery she will continue to have the same problems.   She has been dealing with this for 2 years and would like to have it fixed.  Aware that there are potential complications and that the actual anatomy may not allow an ideal outcome.  Agree with plans.  Electronic Signatures: Manya Silvas (MD)  (Signed on 11-May-13 11:35)  Authored  Last Updated: 11-May-13 11:35 by Manya Silvas (MD)

## 2014-04-30 NOTE — Consult Note (Signed)
Chief Complaint:   Subjective/Chief Complaint Case discussed with Dr. Vira Agar who kindly agreed to follow her. Thanks.   Electronic Signatures: Jill Side (MD)  (Signed 29-Apr-13 18:41)  Authored: Chief Complaint   Last Updated: 29-Apr-13 18:41 by Jill Side (MD)

## 2014-04-30 NOTE — Consult Note (Signed)
I had a long discussion with patient and family and told them she was on maximum therapy and still having periodic attacks bad enough to land her in hospital with documented spells of colonic ischemia.  Vascular studies showed branches of IMA were very small and this is the area of her ischemia.  I told her the future would be like the past if she did not do something different.  She has decided to have surgery with Dr. Pat Patrick during this hospitalization.    Electronic Signatures: Manya Silvas (MD)  (Signed on 08-May-13 08:54)  Authored  Last Updated: 08-May-13 08:54 by Manya Silvas (MD)

## 2014-04-30 NOTE — Op Note (Signed)
PATIENT NAMELIAHNA, [name] MR#:  [id number] DATE OF BIRTH:  22-Feb-1950  DATE OF PROCEDURE:  05/19/2011  PREOPERATIVE DIAGNOSIS: Ischemic colitis.   POSTOPERATIVE DIAGNOSIS: Ischemic colitis.  PROCEDURE PERFORMED: Left colectomy.   ANESTHESIA: General with epidural.   SURGEON: Rodena Goldmann, III, MD    ASSISTANT: Nestor Lewandowsky, MD   OPERATIVE PROCEDURE: With the patient in the supine position after induction of appropriate general anesthesia and placement of an epidural catheter and a Foley catheter, a right intrajugular access line was placed using ultrasound as guidance. The neck was prepped, the jugular vein was identified and cannulated on a single pass using a micropuncture technique. A wire was passed into the great vessel system, exchanged for the introducer, which was then exchanged for the larger wire introducer and vein dilator. The catheter was then placed without difficulty, sutured into place with 3-0 silk and secured to the neck. The ports were appropriately flushed and capped. The patient's abdomen was prepped with Betadine and draped with sterile towels. An alcohol wipe and Betadine-impregnated Steri-Drape were utilized. A midline incision was made from the upper midline to the suprapubic area and carried down through the subcutaneous tissue with Bovie electrocautery. The midline fascia was identified and opened the length of the skin incision, as was the peritoneum. Bowel contents were mobilized into the right upper quadrant. The Omni-Tract retractor was used to provide adequate exposure. The left colon appeared a bit pale but did not appear to have any obvious evidence of stricture or ischemia. The mesentery appeared normal. There did not appear to be any significant areas of necrosis or ischemic change. The colon was examined into the pelvis. Multiple adhesions were taken after the patient's previous hysterectomy. A Contour Stapler was used to divide the bowel in the distal sigmoid  colon. The bowel was then taken down using the LigaSure up through the splenic flexure and around to the right colon. The right colon was mobilized to the mid ascending colon. An area was chosen in the mid transverse colon just past the middle colic artery. The bowel was divided again with the Contour Stapler. The mesentery was continued across the midline using LigaSure apparatus. The bowel was passed off the table. The bowel was mobilized easily into the pelvis. There did not appear to be any tension. A pursestring clamp was placed across the distal bowel after cleaning the bowel edges. The staple line was opened. An EEA 25 dilator was used to size the bowel. The bowel was quite tight and did not appear to be able to tolerate any larger device. An EEA 25 was brought to the table. The anvil was removed and inserted into the distal bowel which was secured with a pursestring. A 2-0 nylon was used for the pursestring. A proximal colotomy was made on a tenia in the proximal transverse colon and the body of the stapler inserted through the proximal colon. The spike was driven out through the staple line, married to the anvil, and the stapler approximated and fired. The rings were intact. Palpating the anastomosis did not identify any abnormalities. The colotomy was closed transversely using a single application of a BJ-62 stapling device. The rectum was then insufflated with the abdomen under saline, and no leaks were identified. The mesentery was reapproximated using 3-0 Vicryl in running fashion. The bowel appeared to lay in satisfactory position. Bowel contents were returned otherwise to their anatomic positions. The abdomen was copiously irrigated. A Seprafilm was placed over the omentum, which  had been placed back into the midline. Stay sutures of #2 nylon were placed for support, and the abdomen was closed with running sutures of doubled 0 PDS from each end. The sutures were tied in the middle and then the bolster  sutures tied over that closure. The skin was stapled and sterile dressings applied. The patient was returned to the recovery room having tolerated the procedure well. Sponge, instrument, and needle counts were correct x2 in the Operating Room.   ____________________________ Rodena Goldmann III, MD rle:cbb D: 05/19/2011 16:35:48 ET T: 05/20/2011 09:11:10 ET JOB#: [id number]  cc: Micheline Maze, MD, <Dictator> Deborra Medina, MD Katha Cabal, MD Manya Silvas, MD Rodena Goldmann MD ELECTRONICALLY SIGNED 05/21/2011 16:40

## 2014-04-30 NOTE — Consult Note (Signed)
No new complaints, VSS afebrile, labs stable, plt ct 142, Met B ok, Hgb 12.  Stool cult neg. Chest clear, abd not tender.  Discussed surgery briefly and post op care and that I would be a 5th wheel during that time. Pt spirits good.  Electronic Signatures: Manya Silvas (MD)  (Signed on 12-May-13 10:40)  Authored  Last Updated: 12-May-13 10:40 by Manya Silvas (MD)

## 2014-05-15 ENCOUNTER — Other Ambulatory Visit: Payer: Self-pay | Admitting: Internal Medicine

## 2014-05-15 ENCOUNTER — Other Ambulatory Visit: Payer: Self-pay | Admitting: *Deleted

## 2014-05-15 MED ORDER — WARFARIN SODIUM 5 MG PO TABS: 5.0000 mg | ORAL_TABLET | Freq: Every day | ORAL | Status: DC

## 2014-05-18 ENCOUNTER — Other Ambulatory Visit (INDEPENDENT_AMBULATORY_CARE_PROVIDER_SITE_OTHER): Payer: PPO

## 2014-05-18 ENCOUNTER — Telehealth: Payer: Self-pay | Admitting: *Deleted

## 2014-05-18 DIAGNOSIS — Z7901 Long term (current) use of anticoagulants: Secondary | ICD-10-CM

## 2014-05-18 NOTE — Telephone Encounter (Signed)
Pt would like for labs to start going to [facility] for now on

## 2014-05-18 NOTE — Telephone Encounter (Signed)
What does that mean? Is she still coming here to get them drawn?

## 2014-05-18 NOTE — Telephone Encounter (Signed)
Yes she is still getting them drawn here, just wants the resulting agent to be sent to [facility]

## 2014-05-18 NOTE — Telephone Encounter (Signed)
Ok, her next INR has been ordered as such

## 2014-05-19 ENCOUNTER — Ambulatory Visit: Payer: PPO

## 2014-05-19 ENCOUNTER — Encounter: Payer: Self-pay | Admitting: Internal Medicine

## 2014-05-19 VITALS — BP 122/74 | HR 70 | Temp 98.4°F | Resp 14 | Ht 61.5 in | Wt 167.2 lb

## 2014-05-19 DIAGNOSIS — Z23 Encounter for immunization: Secondary | ICD-10-CM

## 2014-05-19 DIAGNOSIS — Z780 Asymptomatic menopausal state: Secondary | ICD-10-CM

## 2014-05-19 DIAGNOSIS — Z0000 Encounter for general adult medical examination without abnormal findings: Secondary | ICD-10-CM

## 2014-05-19 LAB — PROTIME-INR
INR: 2.6 — ABNORMAL HIGH (ref 0.8–1.2)
Prothrombin Time: 27.5 s — ABNORMAL HIGH (ref 9.1–12.0)

## 2014-05-19 NOTE — Patient Instructions (Addendum)
Ms. Hornbaker , Thank you for taking time to come for your Medicare Wellness Visit. I appreciate your ongoing commitment to your health goals. Please review the following plan we discussed and let me know if I can assist you in the future. Today you received your Prevnar 13 vaccine and a Dexa Scan has been ordered.   These are the goals we discussed: Goals    . Decrease soda or juice intake    . Increase physical activity     Try walking for 30 minutes at least 3 days a week.       This is a list of the screening recommended for you and due dates:  Health Maintenance  Topic Date Due  . HIV Screening  04/01/63  . Shingles Vaccine  04/01/63  . DEXA scan (bone density measurement)  04/01/63  . Pneumonia vaccines (1 of 2 - PCV13) 04/01/63  . Flu Shot  08/07/63  . Mammogram  09/22/63  . Colon Cancer Screening  04/08/63  . Tetanus Vaccine  10/28/63    Health Maintenance Adopting a healthy lifestyle and getting preventive care can go a long way to promote health and wellness. Talk with your health care provider about what schedule of regular examinations is right for you. This is a good chance for you to check in with your provider about disease prevention and staying healthy. In between checkups, there are plenty of things you can do on your own. Experts have done a lot of research about which lifestyle changes and preventive measures are most likely to keep you healthy. Ask your health care provider for more information. WEIGHT AND DIET  Eat a healthy diet  Be sure to include plenty of vegetables, fruits, low-fat dairy products, and lean protein.  Do not eat a lot of foods high in solid fats, added sugars, or salt.  Get regular exercise. This is one of the most important things you can do for your health.  Most adults should exercise for at least 150 minutes each week. The exercise should increase your heart rate and make you sweat (moderate-intensity exercise).  Most  adults should also do strengthening exercises at least twice a week. This is in addition to the moderate-intensity exercise.  Maintain a healthy weight  Body mass index (BMI) is a measurement that can be used to identify possible weight problems. It estimates body fat based on height and weight. Your health care provider can help determine your BMI and help you achieve or maintain a healthy weight.  For females 18 years of age and older:   A BMI below 18.5 is considered underweight.  A BMI of 18.5 to 24.9 is normal.  A BMI of 25 to 29.9 is considered overweight.  A BMI of 30 and above is considered obese.  Watch levels of cholesterol and blood lipids  You should start having your blood tested for lipids and cholesterol at 65 years of age, then have this test every 5 years.  You may need to have your cholesterol levels checked more often if:  Your lipid or cholesterol levels are high.  You are older than 65 years of age.  You are at high risk for heart disease.  CANCER SCREENING   Lung Cancer  Lung cancer screening is recommended for adults 65-12 years old who are at high risk for lung cancer because of a history of smoking.  A yearly low-dose CT scan of the lungs is recommended for people who:  Currently smoke.  Have quit within the past 15 years.  Have at least a 30-pack-year history of smoking. A pack year is smoking an average of one pack of cigarettes a day for 1 year.  Yearly screening should continue until it has been 15 years since you quit.  Yearly screening should stop if you develop a health problem that would prevent you from having lung cancer treatment.  Breast Cancer  Practice breast self-awareness. This means understanding how your breasts normally appear and feel.  It also means doing regular breast self-exams. Let your health care provider know about any changes, no matter how small.  If you are in your 20s or 30s, you should have a clinical  breast exam (CBE) by a health care provider every 1-3 years as part of a regular health exam.  If you are 65 or older, have a CBE every year. Also consider having a breast X-ray (mammogram) every year.  If you have a family history of breast cancer, talk to your health care provider about genetic screening.  If you are at high risk for breast cancer, talk to your health care provider about having an MRI and a mammogram every year.  Breast cancer gene (BRCA) assessment is recommended for women who have family members with BRCA-related cancers. BRCA-related cancers include:  Breast.  Ovarian.  Tubal.  Peritoneal cancers.  Results of the assessment will determine the need for genetic counseling and BRCA1 and BRCA2 testing. Cervical Cancer Routine pelvic examinations to screen for cervical cancer are no longer recommended for nonpregnant women who are considered low risk for cancer of the pelvic organs (ovaries, uterus, and vagina) and who do not have symptoms. A pelvic examination may be necessary if you have symptoms including those associated with pelvic infections. Ask your health care provider if a screening pelvic exam is right for you.   The Pap test is the screening test for cervical cancer for women who are considered at risk.  If you had a hysterectomy for a problem that was not cancer or a condition that could lead to cancer, then you no longer need Pap tests.  If you are older than 65 years, and you have had normal Pap tests for the past 10 years, you no longer need to have Pap tests.  If you have had past treatment for cervical cancer or a condition that could lead to cancer, you need Pap tests and screening for cancer for at least 20 years after your treatment.  If you no longer get a Pap test, assess your risk factors if they change (such as having a new sexual partner). This can affect whether you should start being screened again.  Some women have medical problems that  increase their chance of getting cervical cancer. If this is the case for you, your health care provider may recommend more frequent screening and Pap tests.  The human papillomavirus (HPV) test is another test that may be used for cervical cancer screening. The HPV test looks for the virus that can cause cell changes in the cervix. The cells collected during the Pap test can be tested for HPV.  The HPV test can be used to screen women 29 years of age and older. Getting tested for HPV can extend the interval between normal Pap tests from three to five years.  An HPV test also should be used to screen women of any age who have unclear Pap test results.  After 65 years of age, women should have HPV  testing as often as Pap tests.  Colorectal Cancer  This type of cancer can be detected and often prevented.  Routine colorectal cancer screening usually begins at 65 years of age and continues through 65 years of age.  Your health care provider may recommend screening at an earlier age if you have risk factors for colon cancer.  Your health care provider may also recommend using home test kits to check for hidden blood in the stool.  A small camera at the end of a tube can be used to examine your colon directly (sigmoidoscopy or colonoscopy). This is done to check for the earliest forms of colorectal cancer.  Routine screening usually begins at age 74.  Direct examination of the colon should be repeated every 5-10 years through 65 years of age. However, you may need to be screened more often if early forms of precancerous polyps or small growths are found. Skin Cancer  Check your skin from head to toe regularly.  Tell your health care provider about any new moles or changes in moles, especially if there is a change in a mole's shape or color.  Also tell your health care provider if you have a mole that is larger than the size of a pencil eraser.  Always use sunscreen. Apply sunscreen  liberally and repeatedly throughout the day.  Protect yourself by wearing long sleeves, pants, a wide-brimmed hat, and sunglasses whenever you are outside. HEART DISEASE, DIABETES, AND HIGH BLOOD PRESSURE   Have your blood pressure checked at least every 1-2 years. High blood pressure causes heart disease and increases the risk of stroke.  If you are between 38 years and 31 years old, ask your health care provider if you should take aspirin to prevent strokes.  Have regular diabetes screenings. This involves taking a blood sample to check your fasting blood sugar level.  If you are at a normal weight and have a low risk for diabetes, have this test once every three years after 65 years of age.  If you are overweight and have a high risk for diabetes, consider being tested at a younger age or more often. PREVENTING INFECTION  Hepatitis B  If you have a higher risk for hepatitis B, you should be screened for this virus. You are considered at high risk for hepatitis B if:  You were born in a country where hepatitis B is common. Ask your health care provider which countries are considered high risk.  Your parents were born in a high-risk country, and you have not been immunized against hepatitis B (hepatitis B vaccine).  You have HIV or AIDS.  You use needles to inject street drugs.  You live with someone who has hepatitis B.  You have had sex with someone who has hepatitis B.  You get hemodialysis treatment.  You take certain medicines for conditions, including cancer, organ transplantation, and autoimmune conditions. Hepatitis C  Blood testing is recommended for:  Everyone born from [birth year] through 1966.  Anyone with known risk factors for hepatitis C. Sexually transmitted infections (STIs)  You should be screened for sexually transmitted infections (STIs) including gonorrhea and chlamydia if:  You are sexually active and are younger than 65 years of age.  You are older than  65 years of age and your health care provider tells you that you are at risk for this type of infection.  Your sexual activity has changed since you were last screened and you are at an increased risk for chlamydia  or gonorrhea. Ask your health care provider if you are at risk.  If you do not have HIV, but are at risk, it may be recommended that you take a prescription medicine daily to prevent HIV infection. This is called pre-exposure prophylaxis (PrEP). You are considered at risk if:  You are sexually active and do not regularly use condoms or know the HIV status of your partner(s).  You take drugs by injection.  You are sexually active with a partner who has HIV. Talk with your health care provider about whether you are at high risk of being infected with HIV. If you choose to begin PrEP, you should first be tested for HIV. You should then be tested every 3 months for as long as you are taking PrEP.  PREGNANCY   If you are premenopausal and you may become pregnant, ask your health care provider about preconception counseling.  If you may become pregnant, take 400 to 800 micrograms (mcg) of folic acid every day.  If you want to prevent pregnancy, talk to your health care provider about birth control (contraception). OSTEOPOROSIS AND MENOPAUSE   Osteoporosis is a disease in which the bones lose minerals and strength with aging. This can result in serious bone fractures. Your risk for osteoporosis can be identified using a bone density scan.  If you are 2 years of age or older, or if you are at risk for osteoporosis and fractures, ask your health care provider if you should be screened.  Ask your health care provider whether you should take a calcium or vitamin D supplement to lower your risk for osteoporosis.  Menopause may have certain physical symptoms and risks.  Hormone replacement therapy may reduce some of these symptoms and risks. Talk to your health care provider about  whether hormone replacement therapy is right for you.  HOME CARE INSTRUCTIONS   Schedule regular health, dental, and eye exams.  Stay current with your immunizations.   Do not use any tobacco products including cigarettes, chewing tobacco, or electronic cigarettes.  If you are pregnant, do not drink alcohol.  If you are breastfeeding, limit how much and how often you drink alcohol.  Limit alcohol intake to no more than 1 drink per day for nonpregnant women. One drink equals 12 ounces of beer, 5 ounces of wine, or 1 ounces of hard liquor.  Do not use street drugs.  Do not share needles.  Ask your health care provider for help if you need support or information about quitting drugs.  Tell your health care provider if you often feel depressed.  Tell your health care provider if you have ever been abused or do not feel safe at home. Document Released: 07/08/2010 Document Revised: 05/09/2013 Document Reviewed: 11/24/2012 [facility] Patient Information 2015 [place], [place]. This information is not intended to replace advice given to you by your health care provider. Make sure you discuss any questions you have with your health care provider.

## 2014-05-19 NOTE — Progress Notes (Unsigned)
Subjective:   [name] is a 64 y.o. female who presents for an Initial Medicare Annual Wellness Visit.  Review of Systems      Cardiac Risk Factors include: obesity (BMI >30kg/m2);hypertension     Objective:    Today's Vitals   05/19/14 1354  BP: 122/74  Pulse: 70  Temp: 98.4 F (36.9 C)  TempSrc: Oral  Resp: 14  Height: 5' 1.5" (1.562 m)  Weight: 167 lb 4 oz (75.864 kg)  SpO2: 96%    Current Medications (verified) Outpatient Encounter Prescriptions as of 05/19/2014  Medication Sig  . ALPRAZolam (XANAX) 0.25 MG tablet Take 0.25 mg by mouth every 6 (six) hours as needed for anxiety.  Marland Kitchen amLODipine (NORVASC) 5 MG tablet Take 1 tablet (5 mg total) by mouth daily.  Marland Kitchen aspirin 81 MG tablet Take 81 mg by mouth daily.  . butalbital-acetaminophen-caffeine (FIORICET, ESGIC) 50-325-40 MG per tablet Take 1 tablet by mouth every 6 (six) hours as needed for headache.  . Cholecalciferol (VITAMIN D3) 1000 UNITS CAPS Take by mouth.  . furosemide (LASIX) 20 MG tablet TAKE ONE TABLET BY MOUTH ONCE DAILY  . imipramine (TOFRANIL) 25 MG tablet TAKE ONE TABLET BY MOUTH AT BEDTIME  . metoprolol succinate (TOPROL-XL) 25 MG 24 hr tablet Take 1 tablet by mouth  daily  . omeprazole (PRILOSEC) 20 MG capsule Take 20 mg by mouth daily.  . simvastatin (ZOCOR) 40 MG tablet Take 1 tablet by mouth at  bedtime  . warfarin (COUMADIN) 5 MG tablet Take 1 tablet (5 mg total) by mouth daily.  Marland Kitchen warfarin (COUMADIN) 6 MG tablet Take 1 tablet by mouth  daily (Patient not taking: Reported on 05/19/2014)  . zoster vaccine live, PF, (ZOSTAVAX) 14431 UNT/0.65ML injection Inject 19,400 Units into the skin once. (Patient not taking: Reported on 05/19/2014)  . [DISCONTINUED] ergocalciferol (DRISDOL) 50000 UNITS capsule Take 1 capsule (50,000 Units total) by mouth once a week.  . [DISCONTINUED] HYDROcodone-acetaminophen (NORCO/VICODIN) 5-325 MG per tablet Take 1 tablet by mouth every 6 (six) hours as needed.  .  [DISCONTINUED] valACYclovir (VALTREX) 1000 MG tablet Take 1 tablet (1,000 mg total) by mouth 3 (three) times daily.   No facility-administered encounter medications on file as of 05/19/2014.    Allergies (verified) Review of patient's allergies indicates no known allergies.   History: Past Medical History  Diagnosis Date  . Hypertension   . Hyperlipidemia   . H/O toxic shock syndrome 2001    post urologic procedure for stone removal  . 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 Ischemic colitis 2012-2013    s/p left colectomy May 2013   Past Surgical History  Procedure Laterality Date  . Spine surgery  1984    lumbar diskectomy, [place]  . Breast surgery  1989    left biopsy, normal  . Abdominal hysterectomy  1996  . Appendectomy    . Bilateral tubal ligation    . Ureterolithotomy    . Colectomy Left May 2013    [place], ischemic colitis   Family History  Problem Relation Age of Onset  . Hypertension Mother   . Heart disease Father   . Hypertension Father    Social History   Occupational History  . Not on file.   Social History Main Topics  . Smoking status: Never Smoker   . Smokeless tobacco: Never Used  . Alcohol Use: No  . Drug Use:  No  . Sexual Activity: Yes    Tobacco Counseling Counseling given: Not Answered   Activities of Daily Living In your present state of health, do you have any difficulty performing the following activities: 05/19/2014  Hearing? Y  Vision? Y  Difficulty concentrating or making decisions? N  Walking or climbing stairs? N  Dressing or bathing? N  Doing errands, shopping? N  Preparing Food and eating ? N  Using the Toilet? N  In the past six months, have you accidently leaked urine? N  Do you have problems with loss of bowel control? Y  Managing your Medications? N  Managing your Finances? N  Housekeeping or managing your Housekeeping? N    Immunizations and Health  Maintenance Immunization History  Administered Date(s) Administered  . Influenza,inj,Quad PF,36+ Mos 10/27/2012, 11/02/2013  . Tdap 10/27/2012   Health Maintenance Due  Topic Date Due  . HIV Screening  03/31/1965  . ZOSTAVAX  03/31/2009  . DEXA SCAN  04/01/2014  . PNA vac Low Risk Adult (1 of 2 - PCV13) 04/01/2014    Patient Care Team: Crecencio Mc, MD as PCP - General (Internal Medicine) Manya Silvas, MD (Gastroenterology)  Indicate any recent Medical Services you may have received from other than [facility] providers in the past year (date may be approximate).     Assessment:   This is a routine wellness examination for [name].   Hearing/Vision screen  Hearing Screening   125Hz  250Hz  500Hz  1000Hz  2000Hz  4000Hz  8000Hz   Right ear:   Pass Pass Pass Pass   Left ear:   Pass Pass Pass Pass     Visual Acuity Screening   Right eye Left eye Both eyes  Without correction:     With correction: 20/30 20/25 20/25   Comments: Last Eye exam done 05/18/14 at [facility]   Dietary issues and exercise activities discussed: Current Exercise Habits:: The patient does not participate in regular exercise at present  Goals    . Decrease soda or juice intake    . Increase physical activity     Try walking for 30 minutes at least 3 days a week.      Depression Screen PHQ 2/9 Scores 05/19/2014  PHQ - 2 Score 0    Fall Risk Fall Risk  05/19/2014  Falls in the past year? Yes  Number falls in past yr: 1  Injury with Fall? Yes  Risk for fall due to : Other (Comment)  Risk for fall due to (comments): Patient had injuried opposite knee and was walking upstairs when she put too much pressure on the leg and it gave out    Cognitive Function: No flowsheet data found.  Screening Tests Health Maintenance  Topic Date Due  . HIV Screening  [birth year]/31/1965  . ZOSTAVAX  03/31/2009  . DEXA SCAN  04/01/2014  . PNA vac Low Risk Adult (1 of 2 - PCV13) 04/01/2014  . INFLUENZA VACCINE   08/07/2014  . MAMMOGRAM  09/22/2015  . COLONOSCOPY  04/07/2020  . TETANUS/TDAP  10/28/2022      Plan:     During the course of the visit, Ragina was educated and counseled about the following appropriate screening and preventive services:   Vaccines to include Pneumoccal, Influenza, Hepatitis B, Td, Zostavax, HCV  Electrocardiogram  Cardiovascular disease screening  Colorectal cancer screening  Bone density screening  Diabetes screening  Glaucoma screening  Mammography/PAP  Nutrition counseling  Smoking cessation counseling  Patient Instructions (the written plan) were given to  the patient.    Geni Bers, LPN   5/99/2341

## 2014-06-19 ENCOUNTER — Telehealth: Payer: Self-pay

## 2014-06-19 MED ORDER — SIMVASTATIN 40 MG PO TABS: ORAL_TABLET | ORAL | Status: DC

## 2014-06-19 NOTE — Telephone Encounter (Signed)
The pt called and is hoping to get her zocor med refilled asap.  Pharmacy - [facility] on [address] callback [phone]

## 2014-06-19 NOTE — Telephone Encounter (Signed)
Ok to refill last lipid last LDL 10/30/13  Refilled for 3 months

## 2014-06-20 ENCOUNTER — Other Ambulatory Visit: Payer: PPO

## 2014-06-20 DIAGNOSIS — Z7901 Long term (current) use of anticoagulants: Secondary | ICD-10-CM

## 2014-06-21 LAB — PROTIME-INR
INR: 3.5 — AB (ref 0.8–1.2)
Prothrombin Time: 36.5 s — ABNORMAL HIGH (ref 9.1–12.0)

## 2014-06-22 ENCOUNTER — Encounter: Payer: Self-pay | Admitting: Internal Medicine

## 2014-06-26 ENCOUNTER — Other Ambulatory Visit: Payer: Self-pay | Admitting: Internal Medicine

## 2014-06-26 DIAGNOSIS — Z7901 Long term (current) use of anticoagulants: Secondary | ICD-10-CM

## 2014-06-28 ENCOUNTER — Ambulatory Visit: Payer: PPO

## 2014-07-13 ENCOUNTER — Other Ambulatory Visit (INDEPENDENT_AMBULATORY_CARE_PROVIDER_SITE_OTHER): Payer: PPO

## 2014-07-13 ENCOUNTER — Encounter: Payer: Self-pay | Admitting: Internal Medicine

## 2014-07-13 DIAGNOSIS — Z7901 Long term (current) use of anticoagulants: Secondary | ICD-10-CM

## 2014-07-13 LAB — PROTIME-INR
INR: 2.3 ratio — ABNORMAL HIGH (ref 0.8–1.0)
Prothrombin Time: 25.4 s — ABNORMAL HIGH (ref 9.6–13.1)

## 2014-08-10 ENCOUNTER — Other Ambulatory Visit: Payer: Self-pay | Admitting: Internal Medicine

## 2014-08-10 NOTE — Telephone Encounter (Signed)
Please advise refill? 

## 2014-08-11 NOTE — Telephone Encounter (Signed)
90 day supply authorized and sent   

## 2014-08-15 ENCOUNTER — Other Ambulatory Visit: Payer: Self-pay | Admitting: Internal Medicine

## 2014-08-17 ENCOUNTER — Other Ambulatory Visit (INDEPENDENT_AMBULATORY_CARE_PROVIDER_SITE_OTHER): Payer: PPO

## 2014-08-17 DIAGNOSIS — Z7901 Long term (current) use of anticoagulants: Secondary | ICD-10-CM | POA: Diagnosis not present

## 2014-08-17 LAB — PROTIME-INR
INR: 2.6 ratio — AB (ref 0.8–1.0)
Prothrombin Time: 27.7 s — ABNORMAL HIGH (ref 9.6–13.1)

## 2014-08-17 NOTE — Addendum Note (Signed)
Addended by: Karlene Einstein D on: 08/17/2014 10:22 AM   Modules accepted: Orders

## 2014-08-18 ENCOUNTER — Encounter: Payer: Self-pay | Admitting: Internal Medicine

## 2014-08-28 ENCOUNTER — Other Ambulatory Visit: Payer: Self-pay | Admitting: Internal Medicine

## 2014-08-28 NOTE — Telephone Encounter (Signed)
Last OV 10.28.15.  Please advise refill

## 2014-08-30 ENCOUNTER — Other Ambulatory Visit: Payer: Self-pay | Admitting: Internal Medicine

## 2014-08-30 DIAGNOSIS — Z79899 Other long term (current) drug therapy: Secondary | ICD-10-CM

## 2014-08-30 DIAGNOSIS — E559 Vitamin D deficiency, unspecified: Secondary | ICD-10-CM

## 2014-08-30 DIAGNOSIS — E785 Hyperlipidemia, unspecified: Secondary | ICD-10-CM

## 2014-08-30 MED ORDER — FUROSEMIDE 20 MG PO TABS: 20.0000 mg | ORAL_TABLET | Freq: Every day | ORAL | Status: DC

## 2014-08-30 NOTE — Telephone Encounter (Signed)
Refill for 30 days only.  Fasting labs  And oFFICE VISIT NEEDED prior to any more refills

## 2014-09-15 ENCOUNTER — Other Ambulatory Visit: Payer: Self-pay | Admitting: *Deleted

## 2014-09-15 MED ORDER — METOPROLOL SUCCINATE ER 25 MG PO TB24: ORAL_TABLET | ORAL | Status: DC

## 2014-09-26 ENCOUNTER — Other Ambulatory Visit (INDEPENDENT_AMBULATORY_CARE_PROVIDER_SITE_OTHER): Payer: PPO

## 2014-09-26 ENCOUNTER — Encounter: Payer: Self-pay | Admitting: Internal Medicine

## 2014-09-26 DIAGNOSIS — Z7901 Long term (current) use of anticoagulants: Secondary | ICD-10-CM

## 2014-09-26 LAB — PROTIME-INR
INR: 2.8 ratio — ABNORMAL HIGH (ref 0.8–1.0)
Prothrombin Time: 29.8 s — ABNORMAL HIGH (ref 9.6–13.1)

## 2014-09-30 ENCOUNTER — Other Ambulatory Visit: Payer: Self-pay | Admitting: Internal Medicine

## 2014-10-05 ENCOUNTER — Other Ambulatory Visit: Payer: Self-pay | Admitting: Internal Medicine

## 2014-10-05 MED ORDER — FUROSEMIDE 20 MG PO TABS: 20.0000 mg | ORAL_TABLET | Freq: Every day | ORAL | Status: DC

## 2014-10-18 ENCOUNTER — Telehealth: Payer: Self-pay | Admitting: Internal Medicine

## 2014-10-18 MED ORDER — ENOXAPARIN SODIUM 40 MG/0.4ML ~~LOC~~ SOLN: 76.0000 mg | Freq: Two times a day (BID) | SUBCUTANEOUS | Status: DC

## 2014-10-18 NOTE — Telephone Encounter (Signed)
Left message for patient to call office on Home voicemail.

## 2014-10-18 NOTE — Telephone Encounter (Signed)
She will need to hold her coumadin for 5 days prior to colonoscopy.I would like to cover  her with lovenox injections every twelve hours  for two days prior to the colonoscopy ,  starting on Day 3 of the coumadin hold. Her last dose should be the night before the colonoscopy. (4 doses total ) Has she taken lovenox before? If not, or if she needs a referesher,  She can pick up the syringes and bring them here .  We wil give her a refresher using saline.

## 2014-10-19 NOTE — Telephone Encounter (Signed)
Patient notified and feels comfortable with giving injections. Script clarified with pharmacy [facility].

## 2014-10-30 ENCOUNTER — Telehealth: Payer: Self-pay | Admitting: Internal Medicine

## 2014-10-30 NOTE — Telephone Encounter (Signed)
Spoke with Patient, she will check again with the pharmacy.  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  She will need 2 days prior of Lovenox again.  Thanks

## 2014-10-30 NOTE — Telephone Encounter (Signed)
See messages from Oct 12 th

## 2014-10-30 NOTE — Telephone Encounter (Signed)
Please advise 

## 2014-10-30 NOTE — Telephone Encounter (Signed)
Pt called to state that she needs to stop taking her Coumadin and start Lovenox. Pt has a procedure (colonoscopy) on 11/13/14. Pt needs to request a rx for Lovenox.msn

## 2014-10-31 ENCOUNTER — Other Ambulatory Visit: Payer: Self-pay

## 2014-10-31 MED ORDER — ENOXAPARIN SODIUM 40 MG/0.4ML ~~LOC~~ SOLN: 76.0000 mg | Freq: Two times a day (BID) | SUBCUTANEOUS | Status: DC

## 2014-11-01 ENCOUNTER — Other Ambulatory Visit: Payer: Self-pay

## 2014-11-01 ENCOUNTER — Other Ambulatory Visit: Payer: Self-pay | Admitting: Surgical

## 2014-11-01 MED ORDER — ENOXAPARIN SODIUM 80 MG/0.8ML ~~LOC~~ SOLN: 75.0000 mg | Freq: Two times a day (BID) | SUBCUTANEOUS | Status: DC

## 2014-11-01 MED ORDER — ENOXAPARIN SODIUM 40 MG/0.4ML ~~LOC~~ SOLN: 76.0000 mg | Freq: Two times a day (BID) | SUBCUTANEOUS | Status: DC

## 2014-11-01 NOTE — Telephone Encounter (Signed)
LM for patient to call back. [place] want to change the dose of her Lovenox to 80/0.8 ml so the patient would be wasting half of the medication. [place] has the correct dosage so sent a new RX to that pharmacy. Went ahead and sent in the medication for the procedure this month and December total of 7 syringes.

## 2014-11-09 ENCOUNTER — Encounter: Payer: Self-pay | Admitting: Internal Medicine

## 2014-11-09 ENCOUNTER — Other Ambulatory Visit (INDEPENDENT_AMBULATORY_CARE_PROVIDER_SITE_OTHER): Payer: PPO

## 2014-11-09 DIAGNOSIS — Z7901 Long term (current) use of anticoagulants: Secondary | ICD-10-CM | POA: Diagnosis not present

## 2014-11-09 LAB — PROTIME-INR
INR: 3.1 ratio — AB (ref 0.8–1.0)
PROTHROMBIN TIME: 33.8 s — AB (ref 9.6–13.1)

## 2014-11-14 ENCOUNTER — Other Ambulatory Visit: Payer: Self-pay | Admitting: Internal Medicine

## 2014-11-14 NOTE — Telephone Encounter (Signed)
From what I can see she as not been seen since last year and i have called her. i left a voicemail stating she needed to callback.

## 2014-11-15 ENCOUNTER — Encounter: Payer: Self-pay | Admitting: *Deleted

## 2014-11-15 ENCOUNTER — Other Ambulatory Visit: Payer: Self-pay | Admitting: Internal Medicine

## 2014-11-15 NOTE — Telephone Encounter (Signed)
Pt needs imipramine (TOFRANIL) 25 MG table,simvastatin (ZOCOR) 40 MG tablet refilled. Pharmacy is [facility] on [address]. Thank You!

## 2014-11-15 NOTE — Telephone Encounter (Signed)
Patient was scheduled for labs 11/20/14.

## 2014-11-15 NOTE — Telephone Encounter (Signed)
She needs the labs that were ordered a month ago before I can fill either.

## 2014-11-15 NOTE — Telephone Encounter (Signed)
Patient has been scheduled for an appointment. This being that she has not been seen in last 6 months. Is it okay to refill for 30 days?

## 2014-11-16 ENCOUNTER — Ambulatory Visit
Admission: RE | Admit: 2014-11-16 | Discharge: 2014-11-16 | Disposition: A | Discharge status: Home / Self Care | Payer: PPO | Source: Ambulatory Visit | Attending: Unknown Physician Specialty | Admitting: Unknown Physician Specialty

## 2014-11-16 ENCOUNTER — Encounter
Admission: RE | Disposition: A | Discharge status: Home / Self Care | Payer: Self-pay | Source: Ambulatory Visit | Attending: Unknown Physician Specialty

## 2014-11-16 ENCOUNTER — Ambulatory Visit: Payer: PPO | Admitting: Anesthesiology

## 2014-11-16 ENCOUNTER — Encounter: Payer: Self-pay | Admitting: Anesthesiology

## 2014-11-16 DIAGNOSIS — F419 Anxiety disorder, unspecified: Secondary | ICD-10-CM | POA: Insufficient documentation

## 2014-11-16 DIAGNOSIS — Z86010 Personal history of colonic polyps: Secondary | ICD-10-CM | POA: Insufficient documentation

## 2014-11-16 DIAGNOSIS — E785 Hyperlipidemia, unspecified: Secondary | ICD-10-CM | POA: Insufficient documentation

## 2014-11-16 DIAGNOSIS — K219 Gastro-esophageal reflux disease without esophagitis: Secondary | ICD-10-CM | POA: Diagnosis not present

## 2014-11-16 DIAGNOSIS — K640 First degree hemorrhoids: Secondary | ICD-10-CM | POA: Diagnosis not present

## 2014-11-16 DIAGNOSIS — Z7982 Long term (current) use of aspirin: Secondary | ICD-10-CM | POA: Insufficient documentation

## 2014-11-16 DIAGNOSIS — Z86711 Personal history of pulmonary embolism: Secondary | ICD-10-CM | POA: Diagnosis not present

## 2014-11-16 DIAGNOSIS — Z79899 Other long term (current) drug therapy: Secondary | ICD-10-CM | POA: Insufficient documentation

## 2014-11-16 DIAGNOSIS — I10 Essential (primary) hypertension: Secondary | ICD-10-CM | POA: Diagnosis not present

## 2014-11-16 DIAGNOSIS — R1310 Dysphagia, unspecified: Secondary | ICD-10-CM | POA: Insufficient documentation

## 2014-11-16 DIAGNOSIS — Z1211 Encounter for screening for malignant neoplasm of colon: Secondary | ICD-10-CM | POA: Insufficient documentation

## 2014-11-16 DIAGNOSIS — Z7901 Long term (current) use of anticoagulants: Secondary | ICD-10-CM | POA: Diagnosis not present

## 2014-11-16 HISTORY — PX: COLONOSCOPY WITH PROPOFOL: SHX5780

## 2014-11-16 HISTORY — DX: Calculus of kidney: N20.0

## 2014-11-16 HISTORY — PX: ESOPHAGOGASTRODUODENOSCOPY (EGD) WITH PROPOFOL: SHX5813

## 2014-11-16 SURGERY — COLONOSCOPY WITH PROPOFOL
Anesthesia: General

## 2014-11-16 MED ORDER — SODIUM CHLORIDE 0.9 % IV SOLN: INTRAVENOUS | Status: DC

## 2014-11-16 MED ORDER — FENTANYL CITRATE (PF) 100 MCG/2ML IJ SOLN
INTRAMUSCULAR | Status: AC
  Administered 2014-11-16: 15:00:00
  Filled 2014-11-16: qty 2

## 2014-11-16 MED ORDER — FENTANYL CITRATE (PF) 100 MCG/2ML IJ SOLN
INTRAMUSCULAR | Status: DC | PRN
  Administered 2014-11-16: 50 ug via INTRAVENOUS

## 2014-11-16 MED ORDER — MIDAZOLAM HCL 2 MG/2ML IJ SOLN
INTRAMUSCULAR | Status: DC | PRN
  Administered 2014-11-16: 1 mg via INTRAVENOUS

## 2014-11-16 MED ORDER — FUROSEMIDE 20 MG PO TABS: 20.0000 mg | ORAL_TABLET | Freq: Every day | ORAL | Status: DC

## 2014-11-16 MED ORDER — SODIUM CHLORIDE 0.9 % IV SOLN
INTRAVENOUS | Status: DC
  Administered 2014-11-16: 1000 mL via INTRAVENOUS

## 2014-11-16 MED ORDER — PROPOFOL 500 MG/50ML IV EMUL
INTRAVENOUS | Status: DC | PRN
  Administered 2014-11-16: 120 ug/kg/min via INTRAVENOUS

## 2014-11-16 MED ORDER — LIDOCAINE HCL (CARDIAC) 20 MG/ML IV SOLN
INTRAVENOUS | Status: DC | PRN
Start: 2014-11-16 — End: 2014-11-16
  Administered 2014-11-16: 40 mg via INTRAVENOUS

## 2014-11-16 MED ORDER — BUTALBITAL-APAP-CAFFEINE 50-325-40 MG PO TABS
1.0000 | ORAL_TABLET | Freq: Once | ORAL | Status: AC
  Administered 2014-11-16: 1 via ORAL
  Filled 2014-11-16: qty 1

## 2014-11-16 MED ORDER — IMIPRAMINE HCL 25 MG PO TABS: 25.0000 mg | ORAL_TABLET | Freq: Every day | ORAL | Status: DC

## 2014-11-16 NOTE — Transfer of Care (Signed)
Immediate Anesthesia Transfer of Care Note  Patient: [name]  Procedure(s) Performed: Procedure(s): COLONOSCOPY WITH PROPOFOL (N/A) ESOPHAGOGASTRODUODENOSCOPY (EGD) WITH PROPOFOL (N/A)  Patient Location: PACU  Anesthesia Type:General  Level of Consciousness: awake and sedated  Airway & Oxygen Therapy: Patient Spontanous Breathing and Patient connected to nasal cannula oxygen  Post-op Assessment: Report given to RN and Post -op Vital signs reviewed and stable  Post vital signs: Reviewed and stable  Last Vitals:  Filed Vitals:   11/16/14 1248  Pulse: 100  Temp: 37.4 C  Resp: 18    Complications: No apparent anesthesia complications

## 2014-11-16 NOTE — Telephone Encounter (Signed)
Refilled for 30 days only.  OFFICE VISIT NEEDED prior to any more refills 

## 2014-11-16 NOTE — Anesthesia Postprocedure Evaluation (Signed)
  Anesthesia Post-op Note  Patient: [name]  Procedure(s) Performed: Procedure(s): COLONOSCOPY WITH PROPOFOL (N/A) ESOPHAGOGASTRODUODENOSCOPY (EGD) WITH PROPOFOL (N/A)  Anesthesia type:General  Patient location: PACU  Post pain: Pain level controlled  Post assessment: Post-op Vital signs reviewed, Patient's Cardiovascular Status Stable, Respiratory Function Stable, Patent Airway and No signs of Nausea or vomiting  Post vital signs: Reviewed and stable  Last Vitals:  Filed Vitals:   11/16/14 1518  BP: 151/87  Pulse: 82  Temp:   Resp: 9    Level of consciousness: awake, alert  and patient cooperative  Complications: No apparent anesthesia complications

## 2014-11-16 NOTE — Telephone Encounter (Signed)
Refilled for 30 days only.  OFFICE VISIT NEEDED prior to any more refills. PLEASE CALL PATIENT

## 2014-11-16 NOTE — Anesthesia Procedure Notes (Signed)
Performed by: COOK-MARTIN, Rogers Ditter Pre-anesthesia Checklist: Patient identified, Emergency Drugs available, Suction available, Patient being monitored and Timeout performed Patient Re-evaluated:Patient Re-evaluated prior to inductionOxygen Delivery Method: Nasal cannula Preoxygenation: Pre-oxygenation with 100% oxygen Intubation Type: IV induction Airway Equipment and Method: Bite block Placement Confirmation: positive ETCO2 and CO2 detector     

## 2014-11-16 NOTE — H&P (Signed)
Primary Care Physician:  Crecencio Mc, MD Primary Gastroenterologist:  Dr. Vira Agar  Pre-Procedure History & Physical: HPI:  [name] is a 64 y.o. female is here for an endoscopy and colonoscopy.   Past Medical History  Diagnosis Date  . Hypertension   . Hyperlipidemia   . H/O toxic shock syndrome 2001    post urologic procedure for stone removal  . 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 Ischemic colitis ([place]) 2012-2013    s/p left colectomy May 2013  . Kidney stones     Past Surgical History  Procedure Laterality Date  . Spine surgery  1984    lumbar diskectomy, [place]  . Breast surgery  1989    left biopsy, normal  . Abdominal hysterectomy  1996  . Appendectomy    . Bilateral tubal ligation    . Ureterolithotomy    . Colectomy Left May 2013    [place], ischemic colitis    Prior to Admission medications   Medication Sig Start Date End Date Taking? Authorizing Provider  ALPRAZolam (XANAX) 0.25 MG tablet Take 0.25 mg by mouth every 6 (six) hours as needed for anxiety.   Yes Historical Provider, MD  amLODipine (NORVASC) 5 MG tablet Take 1 tablet (5 mg total) by mouth daily. 11/10/13  Yes Crecencio Mc, MD  aspirin 81 MG tablet Take 81 mg by mouth daily.   Yes Historical Provider, MD  butalbital-acetaminophen-caffeine (FIORICET, ESGIC) 50-325-40 MG per tablet Take 1 tablet by mouth every 6 (six) hours as needed for headache. 04/05/14  Yes Crecencio Mc, MD  Cholecalciferol (VITAMIN D3) 1000 UNITS CAPS Take by mouth.   Yes Historical Provider, MD  enoxaparin (LOVENOX) 80 MG/0.8ML injection Inject 0.75 mLs (75 mg total) into the skin every 12 (twelve) hours. 11/01/14  Yes Crecencio Mc, MD  metoprolol succinate (TOPROL-XL) 25 MG 24 hr tablet Take 1 tablet by mouth  daily 09/15/14  Yes Crecencio Mc, MD  omeprazole (PRILOSEC) 20 MG capsule Take 20 mg by mouth daily.   Yes Historical Provider, MD  simvastatin (ZOCOR)  40 MG tablet Take 1 tablet by mouth at  bedtime 06/19/14  Yes Crecencio Mc, MD  warfarin (COUMADIN) 5 MG tablet TAKE ONE TABLET BY MOUTH ONCE DAILY 08/16/14  Yes Crecencio Mc, MD  warfarin (COUMADIN) 6 MG tablet Take 1 tablet by mouth  daily 05/03/13  Yes Crecencio Mc, MD  furosemide (LASIX) 20 MG tablet Take 1 tablet (20 mg total) by mouth daily. 10/05/14   Crecencio Mc, MD  imipramine (TOFRANIL) 25 MG tablet TAKE ONE TABLET BY MOUTH AT BEDTIME 11/14/14   Crecencio Mc, MD  zoster vaccine live, PF, (ZOSTAVAX) 16109 UNT/0.65ML injection Inject 19,400 Units into the skin once. Patient not taking: Reported on 05/19/2014 10/27/12   Crecencio Mc, MD    Allergies as of 10/24/2014  . (No Known Allergies)    Family History  Problem Relation Age of Onset  . Hypertension Mother   . Heart disease Father   . Hypertension Father     Social History   Social History  . Marital Status: Married    Spouse Name: N/A  . Number of Children: N/A  . Years of Education: N/A   Occupational History  . Not on file.   Social History Main Topics  . Smoking status: Never Smoker   . Smokeless tobacco: Never  Used  . Alcohol Use: No  . Drug Use: No  . Sexual Activity: Yes   Other Topics Concern  . Not on file   Social History Narrative    Review of Systems: See HPI, otherwise negative ROS  Physical Exam: Pulse 100  Temp(Src) 99.3 F (37.4 C)  Resp 18  Ht 5\' 1"  (1.549 m)  Wt 74.844 kg (165 lb)  BMI 31.19 kg/m2  SpO2 100% General:   Alert,  pleasant and cooperative in NAD Head:  Normocephalic and atraumatic. Neck:  Supple; no masses or thyromegaly. Lungs:  Clear throughout to auscultation.    Heart:  Regular rate and rhythm. Abdomen:  Soft, nontender and nondistended. Normal bowel sounds, without guarding, and without rebound.   Neurologic:  Alert and  oriented x4;  grossly normal neurologically.  Impression/Plan: [name] is here for an endoscopy and colonoscopy to be  performed for dysphagia and personal history of colon poyps  Risks, benefits, limitations, and alternatives regarding  endoscopy and colonoscopy have been reviewed with the patient.  Questions have been answered.  All parties agreeable.   Gaylyn Cheers, MD  11/16/2014, 1:48 PM

## 2014-11-16 NOTE — Op Note (Signed)
[facility] Gastroenterology Patient Name: [name] Procedure Date: 11/16/2014 1:43 PM MRN: [id number] Account #: [id number] Date of Birth: 25-Oct-1950 Admit Type: Outpatient Age: 64 Room: [facility] ENDO ROOM 1 Gender: Female Note Status: Finalized Procedure:         Colonoscopy Indications:       High risk colon cancer surveillance: Personal history of                     colonic polyps Providers:         Manya Silvas, MD Medicines:         Propofol per Anesthesia Complications:     No immediate complications. Procedure:         Pre-Anesthesia Assessment:                    - After reviewing the risks and benefits, the patient was                     deemed in satisfactory condition to undergo the procedure.                    After obtaining informed consent, the colonoscope was                     passed under direct vision. Throughout the procedure, the                     patient's blood pressure, pulse, and oxygen saturations                     were monitored continuously. The Colonoscope was                     introduced through the anus and advanced to the the cecum,                     identified by appendiceal orifice and ileocecal valve. The                     colonoscopy was performed without difficulty. The patient                     tolerated the procedure well. The quality of the bowel                     preparation was excellent. Findings:      Internal hemorrhoids were found during endoscopy. The hemorrhoids were       small and Grade I (internal hemorrhoids that do not prolapse).      A diffuse area of granular mucosa was found in the descending colon and       in the transverse colon. Biopsies were taken with a cold forceps for       histology. Anastamotic areas seen in colon.      The exam was otherwise without abnormality. Impression:        - Internal hemorrhoids.                    - Granularity in the descending colon and in the                     transverse colon. Biopsied.                    -  The examination was otherwise normal. Recommendation:    - Await pathology results. Manya Silvas, MD 11/16/2014 2:15:14 PM This report has been signed electronically. Number of Addenda: 0 Note Initiated On: 11/16/2014 1:43 PM Scope Withdrawal Time: 0 hours 6 minutes 38 seconds  Total Procedure Duration: 0 hours 8 minutes 39 seconds       [facility]

## 2014-11-16 NOTE — Anesthesia Preprocedure Evaluation (Signed)
Anesthesia Evaluation  Patient identified by MRN, date of birth, ID band Patient awake    Reviewed: Allergy & Precautions, NPO status , Patient's Chart, lab work & pertinent test results, reviewed documented beta blocker date and time   Airway Mallampati: III  TM Distance: >3 FB Neck ROM: Full    Dental no notable dental hx.    Pulmonary PE Hx of PE   Pulmonary exam normal breath sounds clear to auscultation       Cardiovascular hypertension, Pt. on medications and Pt. on home beta blockers + Peripheral Vascular Disease  Normal cardiovascular exam  IVC filter   Neuro/Psych negative neurological ROS  negative psych ROS   GI/Hepatic Fatty liver Hx of ischemic colitis   Endo/Other  negative endocrine ROS  Renal/GU Hx of Kidney stones  negative genitourinary   Musculoskeletal negative musculoskeletal ROS (+)   Abdominal Normal abdominal exam  (+)   Peds negative pediatric ROS (+)  Hematology Protein C deficiency   Anesthesia Other Findings Hysterectomy  Reproductive/Obstetrics                             Anesthesia Physical Anesthesia Plan  ASA: III  Anesthesia Plan: General   Post-op Pain Management:    Induction: Intravenous  Airway Management Planned: Nasal Cannula  Additional Equipment:   Intra-op Plan:   Post-operative Plan:   Informed Consent: I have reviewed the patients History and Physical, chart, labs and discussed the procedure including the risks, benefits and alternatives for the proposed anesthesia with the patient or authorized representative who has indicated his/her understanding and acceptance.   Dental advisory given  Plan Discussed with: CRNA and Surgeon  Anesthesia Plan Comments:         Anesthesia Quick Evaluation

## 2014-11-16 NOTE — Op Note (Signed)
[facility] Gastroenterology Patient Name: [name] Procedure Date: 11/16/2014 1:20 PM MRN: [id number] Account #: [id number] Date of Birth: 09/18/1950 Admit Type: Outpatient Age: 64 Room: [facility] ENDO ROOM 1 Gender: Female Note Status: Finalized Procedure:         Upper GI endoscopy Indications:       Dysphagia Providers:         Manya Silvas, MD Medicines:         Propofol per Anesthesia Complications:     No immediate complications. Procedure:         Pre-Anesthesia Assessment:                    - After reviewing the risks and benefits, the patient was                     deemed in satisfactory condition to undergo the procedure.                    - Using IV propofol under the supervision of a CRNA was                     determined to be medically necessary for this procedure                     based on review of the patient's medical history,                     medications, and prior anesthesia history.                    After obtaining informed consent, the endoscope was passed                     under direct vision. Throughout the procedure, the                     patient's blood pressure, pulse, and oxygen saturations                     were monitored continuously. The Endoscope was introduced                     through the mouth, and advanced to the second part of                     duodenum. The upper GI endoscopy was accomplished without                     difficulty. The patient tolerated the procedure well. Findings:      The examined esophagus was normal. A guidewire was placed and the scope       was withdrawn. Dilation was performed with a Savary dilator with mild       resistance at 17 mm.      The stomach was normal.      The examined duodenum was normal. Impression:        - Normal esophagus. Dilated.                    - Normal stomach.                    - Normal examined duodenum.                    -  No specimens  collected. Recommendation:    - soft food for 3 days, eat slowly, chew well, take small                     bites Manya Silvas, MD 11/16/2014 2:28:51 PM Number of Addenda: 0 Note Initiated On: 11/16/2014 1:20 PM      [facility]

## 2014-11-20 ENCOUNTER — Other Ambulatory Visit (INDEPENDENT_AMBULATORY_CARE_PROVIDER_SITE_OTHER): Payer: PPO

## 2014-11-20 ENCOUNTER — Other Ambulatory Visit: Payer: Self-pay | Admitting: Internal Medicine

## 2014-11-20 ENCOUNTER — Encounter: Payer: Self-pay | Admitting: Unknown Physician Specialty

## 2014-11-20 DIAGNOSIS — E785 Hyperlipidemia, unspecified: Secondary | ICD-10-CM

## 2014-11-20 DIAGNOSIS — E559 Vitamin D deficiency, unspecified: Secondary | ICD-10-CM

## 2014-11-20 DIAGNOSIS — Z79899 Other long term (current) drug therapy: Secondary | ICD-10-CM

## 2014-11-20 LAB — COMPREHENSIVE METABOLIC PANEL
ALBUMIN: 4 g/dL (ref 3.5–5.2)
ALT: 25 U/L (ref 0–35)
AST: 19 U/L (ref 0–37)
Alkaline Phosphatase: 110 U/L (ref 39–117)
BUN: 12 mg/dL (ref 6–23)
CHLORIDE: 104 meq/L (ref 96–112)
CO2: 26 mEq/L (ref 19–32)
Calcium: 9 mg/dL (ref 8.4–10.5)
Creatinine, Ser: 0.94 mg/dL (ref 0.40–1.20)
GFR: 63.39 mL/min (ref >60.00)
GLUCOSE: 127 mg/dL — AB (ref 70–99)
POTASSIUM: 3.7 meq/L (ref 3.5–5.1)
SODIUM: 140 meq/L (ref 135–145)
TOTAL PROTEIN: 6.5 g/dL (ref 6.0–8.3)
Total Bilirubin: 0.3 mg/dL (ref 0.2–1.2)

## 2014-11-20 LAB — SURGICAL PATHOLOGY

## 2014-11-20 LAB — LIPID PANEL
CHOL/HDL RATIO: 4
Cholesterol: 185 mg/dL (ref 0–200)
HDL: 42 mg/dL (ref >39.00)
NONHDL: 143.17
TRIGLYCERIDES: 272 mg/dL — AB (ref 0.0–149.0)
VLDL: 54.4 mg/dL — ABNORMAL HIGH (ref 0.0–40.0)

## 2014-11-20 LAB — LDL CHOLESTEROL, DIRECT: Direct LDL: 109 mg/dL

## 2014-11-20 LAB — VITAMIN D 25 HYDROXY (VIT D DEFICIENCY, FRACTURES): VITD: 31.8 ng/mL (ref 30.00–100.00)

## 2014-11-20 NOTE — Telephone Encounter (Signed)
Please advise refill? 

## 2014-11-21 ENCOUNTER — Encounter: Payer: Self-pay | Admitting: Internal Medicine

## 2014-12-05 ENCOUNTER — Encounter: Payer: Self-pay | Admitting: Internal Medicine

## 2014-12-05 ENCOUNTER — Ambulatory Visit (INDEPENDENT_AMBULATORY_CARE_PROVIDER_SITE_OTHER): Payer: PPO | Admitting: Internal Medicine

## 2014-12-05 VITALS — BP 136/80 | HR 69 | Temp 98.4°F | Resp 12 | Ht 61.0 in | Wt 167.2 lb

## 2014-12-05 DIAGNOSIS — K760 Fatty (change of) liver, not elsewhere classified: Secondary | ICD-10-CM

## 2014-12-05 DIAGNOSIS — R635 Abnormal weight gain: Secondary | ICD-10-CM

## 2014-12-05 DIAGNOSIS — R7303 Prediabetes: Secondary | ICD-10-CM

## 2014-12-05 DIAGNOSIS — E785 Hyperlipidemia, unspecified: Secondary | ICD-10-CM

## 2014-12-05 DIAGNOSIS — I10 Essential (primary) hypertension: Secondary | ICD-10-CM

## 2014-12-05 DIAGNOSIS — Z1159 Encounter for screening for other viral diseases: Secondary | ICD-10-CM | POA: Diagnosis not present

## 2014-12-05 DIAGNOSIS — Z79899 Other long term (current) drug therapy: Secondary | ICD-10-CM | POA: Diagnosis not present

## 2014-12-05 DIAGNOSIS — E669 Obesity, unspecified: Secondary | ICD-10-CM

## 2014-12-05 DIAGNOSIS — Z7901 Long term (current) use of anticoagulants: Secondary | ICD-10-CM | POA: Diagnosis not present

## 2014-12-05 DIAGNOSIS — Z1239 Encounter for other screening for malignant neoplasm of breast: Secondary | ICD-10-CM

## 2014-12-05 NOTE — Progress Notes (Addendum)
Subjective:  Patient ID: [name], female    DOB: Jun 01, 1950  Age: 64 y.o. MRN: [id number]  CC: The primary encounter diagnosis was Long term current use of anticoagulant therapy. Diagnoses of Long-term use of high-risk medication, Need for hepatitis C screening test, Weight gain, Breast cancer screening, Essential hypertension, Hyperlipidemia LDL goal <130, Hepatic steatosis, Obesity, and Prediabetes were also pertinent to this visit.  HPI [name] presents for medication refill.  Last seen for annual exam in Oct 2015.  Has been seeing a counsellor to manage the stress created by her husband's mother who has been moved to a nursing home and n She has gained weight,  Is not exercising or following a careful diet.  Taking her medications as directed.   Lab Results  Component Value Date   HGBA1C 6.2 12/05/2014     Has obtained  hearing aids since her last visit.   Underwent colonoscopy earlier this month, normal.    Outpatient Prescriptions Prior to Visit  Medication Sig Dispense Refill  . ALPRAZolam (XANAX) 0.25 MG tablet Take 0.25 mg by mouth every 6 (six) hours as needed for anxiety.    Marland Kitchen amLODipine (NORVASC) 5 MG tablet TAKE ONE TABLET BY MOUTH ONCE DAILY 30 tablet 0  . aspirin 81 MG tablet Take 81 mg by mouth daily.    . butalbital-acetaminophen-caffeine (FIORICET, ESGIC) 50-325-40 MG per tablet Take 1 tablet by mouth every 6 (six) hours as needed for headache. 90 tablet 3  . Cholecalciferol (VITAMIN D3) 1000 UNITS CAPS Take by mouth.    . enoxaparin (LOVENOX) 80 MG/0.8ML injection Inject 0.75 mLs (75 mg total) into the skin every 12 (twelve) hours. 14 Syringe 0  . furosemide (LASIX) 20 MG tablet Take 1 tablet (20 mg total) by mouth daily. 30 tablet 0  . imipramine (TOFRANIL) 25 MG tablet Take 1 tablet (25 mg total) by mouth at bedtime. 30 tablet 0  . metoprolol succinate (TOPROL-XL) 25 MG 24 hr tablet Take 1 tablet by mouth  daily 90 tablet 1  . omeprazole (PRILOSEC) 20 MG  capsule Take 20 mg by mouth daily.    . simvastatin (ZOCOR) 40 MG tablet Take 1 tablet by mouth at  bedtime 90 tablet 0  . simvastatin (ZOCOR) 40 MG tablet TAKE ONE TABLET BY MOUTH AT BEDTIME 30 tablet 0  . warfarin (COUMADIN) 5 MG tablet TAKE ONE TABLET BY MOUTH ONCE DAILY 90 tablet 3  . warfarin (COUMADIN) 6 MG tablet Take 1 tablet by mouth  daily 90 tablet 0  . zoster vaccine live, PF, (ZOSTAVAX) 29562 UNT/0.65ML injection Inject 19,400 Units into the skin once. (Patient not taking: Reported on 12/05/2014) 1 each 0   No facility-administered medications prior to visit.    Review of Systems;  Patient denies headache, fevers, malaise, unintentional weight loss, skin rash, eye pain, sinus congestion and sinus pain, sore throat, dysphagia,  hemoptysis , cough, dyspnea, wheezing, chest pain, palpitations, orthopnea, edema, abdominal pain, nausea, melena, diarrhea, constipation, flank pain, dysuria, hematuria, urinary  Frequency, nocturia, numbness, tingling, seizures,  Focal weakness, Loss of consciousness,  Tremor, insomnia, depression, anxiety, and suicidal ideation.      Objective:  BP 136/80 mmHg  Pulse 69  Temp(Src) 98.4 F (36.9 C) (Oral)  Resp 12  Ht 5\' 1"  (1.549 m)  Wt 167 lb 4 oz (75.864 kg)  BMI 31.62 kg/m2  SpO2 97%  BP Readings from Last 3 Encounters:  12/05/14 136/80  11/16/14 132/69  05/19/14 122/74  Wt Readings from Last 3 Encounters:  12/05/14 167 lb 4 oz (75.864 kg)  11/16/14 165 lb (74.844 kg)  05/19/14 167 lb 4 oz (75.864 kg)    General appearance: alert, cooperative and appears stated age Ears: normal TM's and external ear canals both ears Throat: lips, mucosa, and tongue normal; teeth and gums normal Neck: no adenopathy, no carotid bruit, supple, symmetrical, trachea midline and thyroid not enlarged, symmetric, no tenderness/mass/nodules Back: symmetric, no curvature. ROM normal. No CVA tenderness. Lungs: clear to auscultation bilaterally Heart:  regular rate and rhythm, S1, S2 normal, no murmur, click, rub or gallop Abdomen: soft, non-tender; bowel sounds normal; no masses,  no organomegaly Pulses: 2+ and symmetric Skin: Skin color, texture, turgor normal. No rashes or lesions Lymph nodes: Cervical, supraclavicular, and axillary nodes normal.  Lab Results  Component Value Date   HGBA1C 6.2 12/05/2014    Lab Results  Component Value Date   CREATININE 1.11 12/05/2014   CREATININE 0.94 11/20/2014   CREATININE 1.0 11/02/2013    Lab Results  Component Value Date   WBC 6.0 12/05/2014   HGB 13.9 12/05/2014   HCT 42.3 12/05/2014   PLT 204.0 12/05/2014   GLUCOSE 89 12/05/2014   CHOL 174 12/05/2014   TRIG 112.0 12/05/2014   HDL 45.70 12/05/2014   LDLDIRECT 109.0 11/20/2014   LDLCALC 106* 12/05/2014   ALT 22 12/05/2014   AST 24 12/05/2014   NA 143 12/05/2014   K 3.6 12/05/2014   CL 103 12/05/2014   CREATININE 1.11 12/05/2014   BUN 18 12/05/2014   CO2 30 12/05/2014   TSH 1.27 12/05/2014   INR 2.4* 12/05/2014   HGBA1C 6.2 12/05/2014    No results found.  Assessment & Plan:   Problem List Items Addressed This Visit    Prediabetes    Fasting  glucose was normal this time,  but A1c was elevated at 6.2  .  Advised to  focus on losing weight ( 10% of  current body weight ) over the next 6 months using a  low glycemic index diet and regular participation in an aerobic exercise program.  Return in  3 months to make sure she is  making progress. We can repeat a fasting metabolic panel and 123456  Prior to  visit .      Relevant Orders   Comprehensive metabolic panel   Hemoglobin A1c   Hypertension    Well controlled on current regimen. Renal function stable, no changes today.  Lab Results  Component Value Date   CREATININE 1.11 12/05/2014   Lab Results  Component Value Date   NA 143 12/05/2014   K 3.6 12/05/2014   CL 103 12/05/2014   CO2 30 12/05/2014         Obesity    I have addressed  BMI and recommended  wt loss of 10% of body weigh over the next 6 months using a low glycemic index diet and regular exercise a minimum of 5 days per week.        Hyperlipidemia LDL goal <130    LDL and triglycerides are at goal on current medications. She has no side effects and liver enzymes are normal. No changes today .  Lab Results  Component Value Date   CHOL 174 12/05/2014   HDL 45.70 12/05/2014   LDLCALC 106* 12/05/2014   LDLDIRECT 109.0 11/20/2014   TRIG 112.0 12/05/2014   CHOLHDL 4 12/05/2014   Lab Results  Component Value Date   ALT 22  12/05/2014   AST 24 12/05/2014   ALKPHOS 90 12/05/2014   BILITOT 0.5 12/05/2014           Hepatic steatosis    Diagnosis unclear.  There is n history of hepatic steatosis remarked upon by CT imagine done in 2013 and no abd ultrasound done since then. LFTs are normal.  Dx may have been made by Dr. Vira Agar. Discussed the potential ramifications of diagnosis with patient including management, prognosis and need for vaccination   Lab Results  Component Value Date   ALT 22 12/05/2014   AST 24 12/05/2014   ALKPHOS 90 12/05/2014   BILITOT 0.5 12/05/2014           Long term current use of anticoagulant therapy - Primary   Relevant Orders   CBC with Differential/Platelet (Completed)   INR/PT (Completed)    Other Visit Diagnoses    Long-term use of high-risk medication        Relevant Orders    Comprehensive metabolic panel (Completed)    Need for hepatitis C screening test        Relevant Orders    Hepatitis C antibody (Completed)    Weight gain        Relevant Orders    TSH (Completed)    Lipid panel (Completed)    Hemoglobin A1c (Completed)    Breast cancer screening        Relevant Orders    MM DIGITAL SCREENING BILATERAL      A total of 25 minutes of face to face time was spent with patient more than half of which was spent in counselling about the above mentioned conditions  and coordination of care   I am having Ms. Dangerfield maintain  her omeprazole, Vitamin D3, aspirin, ALPRAZolam, zoster vaccine live (PF), warfarin, butalbital-acetaminophen-caffeine, simvastatin, warfarin, metoprolol succinate, enoxaparin, simvastatin, furosemide, imipramine, and amLODipine.  No orders of the defined types were placed in this encounter.    There are no discontinued medications.  Follow-up: Return in about 3 months (around 03/06/2015) for [place] .   Crecencio Mc, MD

## 2014-12-05 NOTE — Patient Instructions (Signed)
The  diet I discussed with you today is the 10 day Green Smoothie Cleansing /Detox Diet by JJ Smith . available on [employer] for around $10.  This is not a low carb or a weight loss diet,  It is fundamentally a "cleansing" low fat diet that eliminates sugar, gluten, caffeine, alcohol and dairy for 10 days .  What you add back after the initial ten days is entirely up to  you!  You can expect to lose 5 to 10 lbs depending on how strict you are.   I found that  drinking 2 smoothies or juices  daily and keeping one chewable meal (but keep it simple, like baked fish and salad, rice or bok choy) kept me satisfied and kept me from straying  .  You snack primarily on fresh  fruit, egg whites and judicious quantities of nuts. I  Recommend adding a  vegetable based protein powder  To any smoothie made with almond milk.  (nothing with whey , since whey is dairy) in it.  [employer] has a great selection .   It does require some form of a nutrient extractor (Vita Mix, a electric juicer,  Or a Nutribullet Rx).  i have found that using frozen fruits is much more convenient and cost effective. You can even find plenty of organic fruit in the frozen fruit section of [employer].  Just thaw what you need for the following day the night before in the refrigerator (to avoid jamming up your machine)    The organic greens drink I use if I don't have any fresh greens  is called "Suja" and it's sold in the vegetable refrigerated section of most grocery stores (including [employer])  . It is tart, though, so be careful (has lemon juice in it )  The organic vegan protein powder I tried  is called Vega" and I found it at [employer] .  It is sugar free 

## 2014-12-06 LAB — CBC WITH DIFFERENTIAL/PLATELET
Basophils Absolute: 0 K/uL (ref 0.0–0.1)
Basophils Relative: 0.5 % (ref 0.0–3.0)
Eosinophils Absolute: 0.1 K/uL (ref 0.0–0.7)
Eosinophils Relative: 2.4 % (ref 0.0–5.0)
HCT: 42.3 % (ref 36.0–46.0)
Hemoglobin: 13.9 g/dL (ref 12.0–15.0)
Lymphocytes Relative: 40.2 % (ref 12.0–46.0)
Lymphs Abs: 2.4 K/uL (ref 0.7–4.0)
MCHC: 32.8 g/dL (ref 30.0–36.0)
MCV: 85.5 fl (ref 78.0–100.0)
Monocytes Absolute: 0.5 K/uL (ref 0.1–1.0)
Monocytes Relative: 8 % (ref 3.0–12.0)
Neutro Abs: 2.9 K/uL (ref 1.4–7.7)
Neutrophils Relative %: 48.9 % (ref 43.0–77.0)
Platelets: 204 K/uL (ref 150.0–400.0)
RBC: 4.95 Mil/uL (ref 3.87–5.11)
RDW: 14.7 % (ref 11.5–15.5)
WBC: 6 K/uL (ref 4.0–10.5)

## 2014-12-06 LAB — HEMOGLOBIN A1C: Hgb A1c MFr Bld: 6.2 % (ref 4.6–6.5)

## 2014-12-06 LAB — TSH: TSH: 1.27 u[IU]/mL (ref 0.35–4.50)

## 2014-12-06 LAB — COMPREHENSIVE METABOLIC PANEL WITH GFR
ALT: 22 U/L (ref 0–35)
AST: 24 U/L (ref 0–37)
Albumin: 4.1 g/dL (ref 3.5–5.2)
Alkaline Phosphatase: 90 U/L (ref 39–117)
BUN: 18 mg/dL (ref 6–23)
CO2: 30 meq/L (ref 19–32)
Calcium: 9.2 mg/dL (ref 8.4–10.5)
Chloride: 103 meq/L (ref 96–112)
Creatinine, Ser: 1.11 mg/dL (ref 0.40–1.20)
GFR: 52.32 mL/min — ABNORMAL LOW
Glucose, Bld: 89 mg/dL (ref 70–99)
Potassium: 3.6 meq/L (ref 3.5–5.1)
Sodium: 143 meq/L (ref 135–145)
Total Bilirubin: 0.5 mg/dL (ref 0.2–1.2)
Total Protein: 6.9 g/dL (ref 6.0–8.3)

## 2014-12-06 LAB — LIPID PANEL
CHOL/HDL RATIO: 4
Cholesterol: 174 mg/dL (ref 0–200)
HDL: 45.7 mg/dL (ref >39.00)
LDL CALC: 106 mg/dL — AB (ref 0–99)
NONHDL: 128.23
TRIGLYCERIDES: 112 mg/dL (ref 0.0–149.0)
VLDL: 22.4 mg/dL (ref 0.0–40.0)

## 2014-12-06 LAB — PROTIME-INR
INR: 2.4 ratio — ABNORMAL HIGH (ref 0.8–1.0)
Prothrombin Time: 25.8 s — ABNORMAL HIGH (ref 9.6–13.1)

## 2014-12-06 LAB — HEPATITIS C ANTIBODY: HCV AB: NEGATIVE

## 2014-12-06 LAB — HM COLONOSCOPY: HM Colonoscopy: NORMAL

## 2014-12-06 NOTE — Assessment & Plan Note (Signed)
I have addressed  BMI and recommended wt loss of 10% of body weigh over the next 6 months using a low glycemic index diet and regular exercise a minimum of 5 days per week.   

## 2014-12-06 NOTE — Assessment & Plan Note (Signed)
LDL and triglycerides are at goal on current medications. She has no side effects and liver enzymes are normal. No changes today .  Lab Results  Component Value Date   CHOL 174 12/05/2014   HDL 45.70 12/05/2014   LDLCALC 106* 12/05/2014   LDLDIRECT 109.0 11/20/2014   TRIG 112.0 12/05/2014   CHOLHDL 4 12/05/2014   Lab Results  Component Value Date   ALT 22 12/05/2014   AST 24 12/05/2014   ALKPHOS 90 12/05/2014   BILITOT 0.5 12/05/2014

## 2014-12-06 NOTE — Assessment & Plan Note (Signed)
Diagnosis unclear.  There is n history of hepatic steatosis remarked upon by CT imagine done in 2013 and no abd ultrasound done since then. LFTs are normal.  Dx may have been made by Dr. Vira Agar. Discussed the potential ramifications of diagnosis with patient including management, prognosis and need for vaccination   Lab Results  Component Value Date   ALT 22 12/05/2014   AST 24 12/05/2014   ALKPHOS 90 12/05/2014   BILITOT 0.5 12/05/2014

## 2014-12-06 NOTE — Assessment & Plan Note (Signed)
Well controlled on current regimen. Renal function stable, no changes today.  Lab Results  Component Value Date   CREATININE 1.11 12/05/2014   Lab Results  Component Value Date   NA 143 12/05/2014   K 3.6 12/05/2014   CL 103 12/05/2014   CO2 30 12/05/2014

## 2014-12-08 ENCOUNTER — Encounter: Payer: Self-pay | Admitting: Internal Medicine

## 2014-12-08 DIAGNOSIS — E1129 Type 2 diabetes mellitus with other diabetic kidney complication: Secondary | ICD-10-CM | POA: Insufficient documentation

## 2014-12-08 DIAGNOSIS — E119 Type 2 diabetes mellitus without complications: Secondary | ICD-10-CM | POA: Insufficient documentation

## 2014-12-08 DIAGNOSIS — R809 Proteinuria, unspecified: Secondary | ICD-10-CM | POA: Insufficient documentation

## 2014-12-08 NOTE — Assessment & Plan Note (Signed)
Fasting  glucose was normal this time,  but A1c was elevated at 6.2  .  Advised to  focus on losing weight ( 10% of  current body weight ) over the next 6 months using a  low glycemic index diet and regular participation in an aerobic exercise program.  Return in  3 months to make sure she is  making progress. We can repeat a fasting metabolic panel and 123456  Prior to  visit .

## 2014-12-08 NOTE — Addendum Note (Signed)
Addended by: Crecencio Mc on: 12/08/2014 11:36 AM   Modules accepted: Orders

## 2014-12-18 ENCOUNTER — Other Ambulatory Visit: Payer: Self-pay | Admitting: Internal Medicine

## 2014-12-22 ENCOUNTER — Telehealth: Payer: Self-pay | Admitting: Internal Medicine

## 2014-12-22 NOTE — Telephone Encounter (Signed)
Left msg to call office to schedule AWV with Dr. Konrad Felix

## 2015-01-05 ENCOUNTER — Other Ambulatory Visit (INDEPENDENT_AMBULATORY_CARE_PROVIDER_SITE_OTHER): Payer: PPO

## 2015-01-05 DIAGNOSIS — R7303 Prediabetes: Secondary | ICD-10-CM

## 2015-01-05 DIAGNOSIS — Z7901 Long term (current) use of anticoagulants: Secondary | ICD-10-CM

## 2015-01-05 NOTE — Addendum Note (Signed)
Addended by: Kerin Salen R on: 01/05/2015 11:11 AM   Modules accepted: Orders

## 2015-01-06 LAB — PROTIME-INR
INR: 2.1 — ABNORMAL HIGH (ref 0.8–1.2)
Prothrombin Time: 21.3 s — ABNORMAL HIGH (ref 9.1–12.0)

## 2015-01-08 ENCOUNTER — Other Ambulatory Visit: Payer: Self-pay | Admitting: Internal Medicine

## 2015-01-08 ENCOUNTER — Encounter: Payer: Self-pay | Admitting: Internal Medicine

## 2015-01-31 DIAGNOSIS — H04551 Acquired stenosis of right nasolacrimal duct: Secondary | ICD-10-CM | POA: Diagnosis not present

## 2015-02-09 ENCOUNTER — Encounter: Payer: Self-pay | Admitting: Internal Medicine

## 2015-02-09 ENCOUNTER — Other Ambulatory Visit (INDEPENDENT_AMBULATORY_CARE_PROVIDER_SITE_OTHER): Payer: PPO

## 2015-02-09 DIAGNOSIS — Z7901 Long term (current) use of anticoagulants: Secondary | ICD-10-CM | POA: Diagnosis not present

## 2015-02-09 LAB — PROTIME-INR
INR: 2.6 ratio — ABNORMAL HIGH (ref 0.8–1.0)
Prothrombin Time: 27.6 s — ABNORMAL HIGH (ref 9.6–13.1)

## 2015-02-26 ENCOUNTER — Telehealth: Payer: Self-pay | Admitting: *Deleted

## 2015-02-26 ENCOUNTER — Other Ambulatory Visit (INDEPENDENT_AMBULATORY_CARE_PROVIDER_SITE_OTHER): Payer: PPO

## 2015-02-26 DIAGNOSIS — R7303 Prediabetes: Secondary | ICD-10-CM

## 2015-02-26 LAB — HEMOGLOBIN A1C: Hgb A1c MFr Bld: 6.1 % (ref 4.6–6.5)

## 2015-02-26 LAB — COMPREHENSIVE METABOLIC PANEL
ALT: 18 U/L (ref 0–35)
AST: 21 U/L (ref 0–37)
Albumin: 4.3 g/dL (ref 3.5–5.2)
Alkaline Phosphatase: 87 U/L (ref 39–117)
BILIRUBIN TOTAL: 0.5 mg/dL (ref 0.2–1.2)
BUN: 14 mg/dL (ref 6–23)
CALCIUM: 9.4 mg/dL (ref 8.4–10.5)
CHLORIDE: 104 meq/L (ref 96–112)
CO2: 30 meq/L (ref 19–32)
Creatinine, Ser: 0.92 mg/dL (ref 0.40–1.20)
GFR: 64.93 mL/min (ref >60.00)
GLUCOSE: 103 mg/dL — AB (ref 70–99)
POTASSIUM: 4.2 meq/L (ref 3.5–5.1)
Sodium: 142 mEq/L (ref 135–145)
Total Protein: 6.5 g/dL (ref 6.0–8.3)

## 2015-02-26 NOTE — Telephone Encounter (Signed)
They have been ordered since December

## 2015-02-26 NOTE — Telephone Encounter (Signed)
Labs and dx?  

## 2015-02-27 ENCOUNTER — Encounter: Payer: Self-pay | Admitting: Internal Medicine

## 2015-03-05 ENCOUNTER — Ambulatory Visit (INDEPENDENT_AMBULATORY_CARE_PROVIDER_SITE_OTHER): Payer: PPO | Admitting: Internal Medicine

## 2015-03-05 ENCOUNTER — Encounter: Payer: Self-pay | Admitting: Internal Medicine

## 2015-03-05 VITALS — BP 120/78 | HR 65 | Temp 97.9°F | Resp 12 | Ht 61.0 in | Wt 151.2 lb

## 2015-03-05 DIAGNOSIS — E119 Type 2 diabetes mellitus without complications: Secondary | ICD-10-CM | POA: Diagnosis not present

## 2015-03-05 DIAGNOSIS — E669 Obesity, unspecified: Secondary | ICD-10-CM

## 2015-03-05 DIAGNOSIS — F5105 Insomnia due to other mental disorder: Secondary | ICD-10-CM

## 2015-03-05 DIAGNOSIS — Z1239 Encounter for other screening for malignant neoplasm of breast: Secondary | ICD-10-CM | POA: Diagnosis not present

## 2015-03-05 DIAGNOSIS — Z0000 Encounter for general adult medical examination without abnormal findings: Secondary | ICD-10-CM | POA: Diagnosis not present

## 2015-03-05 DIAGNOSIS — R358 Other polyuria: Secondary | ICD-10-CM | POA: Diagnosis not present

## 2015-03-05 DIAGNOSIS — R3589 Other polyuria: Secondary | ICD-10-CM

## 2015-03-05 DIAGNOSIS — F409 Phobic anxiety disorder, unspecified: Secondary | ICD-10-CM

## 2015-03-05 LAB — POCT URINALYSIS DIPSTICK
BILIRUBIN UA: NEGATIVE
Blood, UA: NEGATIVE
Glucose, UA: NEGATIVE
KETONES UA: NEGATIVE
Nitrite, UA: NEGATIVE
Protein, UA: NEGATIVE
Spec Grav, UA: <=1.005
Urobilinogen, UA: 0.2
pH, UA: 5.5

## 2015-03-05 LAB — MICROALBUMIN / CREATININE URINE RATIO
Creatinine,U: 14.1 mg/dL
Microalb Creat Ratio: 5 mg/g (ref 0.0–30.0)
Microalb, Ur: <0.7 mg/dL (ref 0.0–1.9)

## 2015-03-05 MED ORDER — ALPRAZOLAM 0.25 MG PO TABS: 0.2500 mg | ORAL_TABLET | Freq: Every evening | ORAL | Status: DC | PRN

## 2015-03-05 MED ORDER — ENOXAPARIN SODIUM 80 MG/0.8ML ~~LOC~~ SOLN: 70.0000 mg | Freq: Two times a day (BID) | SUBCUTANEOUS | Status: DC

## 2015-03-05 NOTE — Patient Instructions (Addendum)
We've got to find some substitutions for your potatoes!!  Try the mashed cauliflower and riced cauliflower dishes instead of rice and mashed potatoes  Mashed turnips are also very low carb!   If you crave cereal:  Try the Heritage Flakes cereal available at [facility] on the bottom shelf, with vanilla flavored soy milk (silk)   Try walking after your biggest meal to lower sugars.    To make a low carb chip :  Take the Joseph's Lavash or Pita bread,  Or the [place] Low carb whole wheat tortilla :  1) Place on metal cookie sheet  2) Brush with olive oil  3) Sprinkle garlic powder (NOT garlic salt), grated parmesan cheese, mediterranean seasoning , or all of them!   Bake at 225 or 250 for 50 to 90 minutes  (until they start to brown or feel crisp)   Very very low carb!!!!   [employer] [place]  Key lime, tiramisu, and strawberyy cheesecake.  Top with Roena Malady(   I sent an rx for Lovenox  (6 syringes) to [facility].  Your dose is now 70 mg (becuaes it is weight based!)

## 2015-03-05 NOTE — Progress Notes (Signed)
Pre-visit discussion using our clinic review tool. No additional management support is needed unless otherwise documented below in the visit note.  

## 2015-03-05 NOTE — Progress Notes (Signed)
Patient ID: [name], female    DOB: 11-24-1950  Age: 65 y.o. MRN: [id number]  The patient is here for annual Medicare wellness examination and management of other chronic and acute problems.    Has had flu and prevnar and tdap vaccines S/p hysterectomy Colonoscopy by [facility] Nov 2016  Lab Results  Component Value Date   HGBA1C 6.1 02/26/2015   The risk factors are reflected in the social history.  The roster of all physicians providing medical care to patient - is listed in the Snapshot section of the chart.  Activities of daily living:  The patient is 100% independent in all ADLs: dressing, toileting, feeding as well as independent mobility  Home safety : The patient has smoke detectors in the home. They wear seatbelts.  There are no firearms at home. There is no violence in the home.   There is no risks for hepatitis, STDs or HIV. There is no   history of blood transfusion. They have no travel history to infectious disease endemic areas of the world.  The patient has seen their dentist in the last six month. They have seen their eye doctor in the last year. They admit to slight hearing difficulty with regard to whispered voices and some television programs.  They have deferred audiologic testing in the last year.  They do not  have excessive sun exposure. Discussed the need for sun protection: hats, long sleeves and use of sunscreen if there is significant sun exposure.   Diet: the importance of a healthy diet is discussed. They do have a healthy diet.  The benefits of regular aerobic exercise were discussed. She walks 4 times per week ,  20 minutes.   Depression screen: there are no signs or vegative symptoms of depression- irritability, change in appetite, anhedonia, sadness/tearfullness.  Cognitive assessment: the patient manages all their financial and personal affairs and is actively engaged. They could relate day,date,year and events; recalled 2/3 objects at 3 minutes;  performed clock-face test normally.  The following portions of the patient's history were reviewed and updated as appropriate: allergies, current medications, past family history, past medical history,  past surgical history, past social history  and problem list.  Visual acuity was not assessed per patient preference since she has regular follow up with her ophthalmologist. Hearing and body mass index were assessed and reviewed.   During the course of the visit the patient was educated and counseled about appropriate screening and preventive services including : fall prevention , diabetes screening, nutrition counseling, colorectal cancer screening, and recommended immunizations.    CC: The primary encounter diagnosis was Insomnia due to anxiety and fear. Diagnoses of Breast cancer screening, Polyuria, Diabetes mellitus without complication ([place]), Encounter for preventive health examination, Diabetes mellitus type 2, diet-controlled ([place]), and Obesity were also pertinent to this visit.  Intentional weight loss of 16 lbs  since last visit .  Followig a low GI diet and walking daily History Paityn has a past medical history of Hypertension; Hyperlipidemia; H/O toxic shock syndrome (2001); History of pulmonary embolus (PE) (Sept 2011); Ischemic colitis ([place]) (2012-2013); and Kidney stones.   She has past surgical history that includes Spine surgery (1984); Breast surgery (1989); Abdominal hysterectomy (1996); Appendectomy; bilateral tubal ligation; ureterolithotomy; Colectomy (Left, May 2013); Colonoscopy with propofol (N/A, 11/16/2014); and Esophagogastroduodenoscopy (egd) with propofol (N/A, 11/16/2014).   Her family history includes Heart disease in her father; Hypertension in her father and mother.She reports that she has never smoked. She has never  used smokeless tobacco. She reports that she does not drink alcohol or use illicit drugs.  Outpatient Prescriptions Prior to Visit  Medication Sig  Dispense Refill  . amLODipine (NORVASC) 5 MG tablet TAKE ONE TABLET BY MOUTH ONCE DAILY 30 tablet 4  . aspirin 81 MG tablet Take 81 mg by mouth daily.    . butalbital-acetaminophen-caffeine (FIORICET, ESGIC) 50-325-40 MG per tablet Take 1 tablet by mouth every 6 (six) hours as needed for headache. 90 tablet 3  . Cholecalciferol (VITAMIN D3) 1000 UNITS CAPS Take by mouth.    . furosemide (LASIX) 20 MG tablet Take 1 tablet (20 mg total) by mouth daily. 30 tablet 0  . imipramine (TOFRANIL) 25 MG tablet Take 1 tablet (25 mg total) by mouth at bedtime. 30 tablet 0  . metoprolol succinate (TOPROL-XL) 25 MG 24 hr tablet Take 1 tablet by mouth  daily 90 tablet 1  . omeprazole (PRILOSEC) 20 MG capsule Take 20 mg by mouth daily.    . simvastatin (ZOCOR) 40 MG tablet Take 1 tablet by mouth at  bedtime 90 tablet 0  . warfarin (COUMADIN) 5 MG tablet TAKE ONE TABLET BY MOUTH ONCE DAILY 90 tablet 3  . warfarin (COUMADIN) 6 MG tablet Take 1 tablet by mouth  daily 90 tablet 0  . ALPRAZolam (XANAX) 0.25 MG tablet Take 0.25 mg by mouth every 6 (six) hours as needed for anxiety.    . zoster vaccine live, PF, (ZOSTAVAX) 57846 UNT/0.65ML injection Inject 19,400 Units into the skin once. (Patient not taking: Reported on 12/05/2014) 1 each 0  . enoxaparin (LOVENOX) 80 MG/0.8ML injection Inject 0.75 mLs (75 mg total) into the skin every 12 (twelve) hours. (Patient not taking: Reported on 03/05/2015) 14 Syringe 0  . simvastatin (ZOCOR) 40 MG tablet TAKE ONE TABLET BY MOUTH AT BEDTIME 30 tablet 5   No facility-administered medications prior to visit.    Review of Systems   Patient denies headache, fevers, malaise, unintentional weight loss, skin rash, eye pain, sinus congestion and sinus pain, sore throat, dysphagia,  hemoptysis , cough, dyspnea, wheezing, chest pain, palpitations, orthopnea, edema, abdominal pain, nausea, melena, diarrhea, constipation, flank pain, dysuria, hematuria, urinary  Frequency, nocturia,  numbness, tingling, seizures,  Focal weakness, Loss of consciousness,  Tremor, insomnia, depression, anxiety, and suicidal ideation.      Objective:  BP 120/78 mmHg  Pulse 65  Temp(Src) 97.9 F (36.6 C) (Oral)  Resp 12  Ht 5\' 1"  (1.549 m)  Wt 151 lb 4 oz (68.607 kg)  BMI 28.59 kg/m2  SpO2 98%  Physical Exam   General appearance: alert, cooperative and appears stated age Head: Normocephalic, without obvious abnormality, atraumatic Eyes: conjunctivae/corneas clear. PERRL, EOM's intact. Fundi benign. Ears: normal TM's and external ear canals both ears Nose: Nares normal. Septum midline. Mucosa normal. No drainage or sinus tenderness. Throat: lips, mucosa, and tongue normal; teeth and gums normal Neck: no adenopathy, no carotid bruit, no JVD, supple, symmetrical, trachea midline and thyroid not enlarged, symmetric, no tenderness/mass/nodules Lungs: clear to auscultation bilaterally Breasts: normal appearance, no masses or tenderness Heart: regular rate and rhythm, S1, S2 normal, no murmur, click, rub or gallop Abdomen: soft, non-tender; bowel sounds normal; no masses,  no organomegaly Extremities: extremities normal, atraumatic, no cyanosis or edema Pulses: 2+ and symmetric Skin: Skin color, texture, turgor normal. No rashes or lesions Neurologic: Alert and oriented X 3, normal strength and tone. Normal symmetric reflexes. Normal coordination and gait.     Assessment & Plan:  Problem List Items Addressed This Visit    Obesity    I have congratulated her in reduction of   BMI and encouraged  Continued weight loss with goal of 10% of body weigh over the next 6 months using a low glycemic index diet and regular exercise a minimum of 5 days per week.        Encounter for preventive health examination    Annual comprehensive preventive exam was done as well as an evaluation and management of chronic conditions .  During the course of the visit the patient was educated and  counseled about appropriate screening and preventive services including :  diabetes screening, lipid analysis with projected  10 year  risk for CAD , nutrition counseling, breast, cervical and colorectal cancer screening, and recommended immunizations.  Printed recommendations for health maintenance screenings was give      Diabetes mellitus type 2, diet-controlled ([place])    She has lost weight and is following a  low glycemic index diet and regular participation in an aerobic exercise program.  A1c is now 6.1   Lab Results  Component Value Date   HGBA1C 6.1 02/26/2015   Lab Results  Component Value Date   MICROALBUR <0.7 03/05/2015          Other Visit Diagnoses    Insomnia due to anxiety and fear    -  Primary    Relevant Medications    ALPRAZolam (XANAX) 0.25 MG tablet    Other Relevant Orders    POCT urinalysis dipstick (Completed)    Breast cancer screening        Relevant Orders    MM DIGITAL SCREENING BILATERAL    POCT urinalysis dipstick (Completed)    Polyuria        Relevant Orders    Urinalysis, dipstick only    Urine culture (Completed)    POCT urinalysis dipstick (Completed)    Diabetes mellitus without complication (HCC)        Relevant Orders    Microalbumin / creatinine urine ratio (Completed)    Lipid panel    Hemoglobin A1c    Comprehensive metabolic panel    POCT urinalysis dipstick (Completed)       I have changed Ms. Dake's enoxaparin and ALPRAZolam. I am also having her maintain her omeprazole, Vitamin D3, aspirin, zoster vaccine live (PF), warfarin, butalbital-acetaminophen-caffeine, simvastatin, warfarin, metoprolol succinate, furosemide, imipramine, and amLODipine.  Meds ordered this encounter  Medications  . enoxaparin (LOVENOX) 80 MG/0.8ML injection    Sig: Inject 0.7 mLs (70 mg total) into the skin every 12 (twelve) hours.    Dispense:  6 Syringe    Refill:  0  . ALPRAZolam (XANAX) 0.25 MG tablet    Sig: Take 1 tablet (0.25 mg total) by  mouth at bedtime as needed for anxiety.    Dispense:  30 tablet    Refill:  3    Medications Discontinued During This Encounter  Medication Reason  . simvastatin (ZOCOR) 40 MG tablet Duplicate  . enoxaparin (LOVENOX) 80 MG/0.8ML injection Reorder  . ALPRAZolam (XANAX) 0.25 MG tablet Reorder    Follow-up: Return in about 6 months (around 09/02/2015) for follow up diabetes.   Crecencio Mc, MD

## 2015-03-06 ENCOUNTER — Encounter: Payer: Self-pay | Admitting: Internal Medicine

## 2015-03-06 LAB — URINE CULTURE
COLONY COUNT: NO GROWTH
Organism ID, Bacteria: NO GROWTH

## 2015-03-07 ENCOUNTER — Encounter: Payer: Self-pay | Admitting: Internal Medicine

## 2015-03-07 NOTE — Assessment & Plan Note (Signed)
I have congratulated her in reduction of   BMI and encouraged  Continued weight loss with goal of 10% of body weigh over the next 6 months using a low glycemic index diet and regular exercise a minimum of 5 days per week.    

## 2015-03-07 NOTE — Assessment & Plan Note (Signed)
Annual comprehensive preventive exam was done as well as an evaluation and management of chronic conditions .  During the course of the visit the patient was educated and counseled about appropriate screening and preventive services including :  diabetes screening, lipid analysis with projected  10 year  risk for CAD , nutrition counseling, breast, cervical and colorectal cancer screening, and recommended immunizations.  Printed recommendations for health maintenance screenings was give 

## 2015-03-07 NOTE — Assessment & Plan Note (Addendum)
She has lost weight and is following a  low glycemic index diet and regular participation in an aerobic exercise program.  A1c is now 6.1   Lab Results  Component Value Date   HGBA1C 6.1 02/26/2015   Lab Results  Component Value Date   MICROALBUR <0.7 03/05/2015

## 2015-03-12 ENCOUNTER — Telehealth: Payer: Self-pay

## 2015-03-12 NOTE — Telephone Encounter (Signed)
Pt states that her Xanax script was not at the pharmacy when she called to pick it up. I researched her char and called the pharmacy for confirmation. The script was printed out for the pt at her last OV with Dr. Derrel Nip with her office visit summary. I explained to [name] she needed to check her paper work and look for the prescription. Pt verbalized understanding and stated that she would look for her script.

## 2015-03-22 ENCOUNTER — Other Ambulatory Visit (INDEPENDENT_AMBULATORY_CARE_PROVIDER_SITE_OTHER): Payer: PPO

## 2015-03-22 DIAGNOSIS — E119 Type 2 diabetes mellitus without complications: Secondary | ICD-10-CM | POA: Diagnosis not present

## 2015-03-22 DIAGNOSIS — Z7901 Long term (current) use of anticoagulants: Secondary | ICD-10-CM

## 2015-03-22 LAB — PROTIME-INR
INR: 2.2 ratio — AB (ref 0.8–1.0)
PROTHROMBIN TIME: 23.7 s — AB (ref 9.6–13.1)

## 2015-03-22 NOTE — Addendum Note (Signed)
Addended by: Karlene Einstein D on: 03/22/2015 11:00 AM   Modules accepted: Orders

## 2015-03-23 ENCOUNTER — Encounter: Payer: Self-pay | Admitting: Internal Medicine

## 2015-03-26 ENCOUNTER — Other Ambulatory Visit: Payer: Self-pay | Admitting: Internal Medicine

## 2015-03-26 DIAGNOSIS — D688 Other specified coagulation defects: Secondary | ICD-10-CM | POA: Diagnosis not present

## 2015-03-26 DIAGNOSIS — I10 Essential (primary) hypertension: Secondary | ICD-10-CM | POA: Diagnosis not present

## 2015-03-26 DIAGNOSIS — E785 Hyperlipidemia, unspecified: Secondary | ICD-10-CM | POA: Diagnosis not present

## 2015-03-26 DIAGNOSIS — Z01818 Encounter for other preprocedural examination: Secondary | ICD-10-CM | POA: Diagnosis not present

## 2015-03-26 DIAGNOSIS — E119 Type 2 diabetes mellitus without complications: Secondary | ICD-10-CM | POA: Diagnosis not present

## 2015-04-09 DIAGNOSIS — M2242 Chondromalacia patellae, left knee: Secondary | ICD-10-CM | POA: Diagnosis not present

## 2015-04-17 DIAGNOSIS — H1189 Other specified disorders of conjunctiva: Secondary | ICD-10-CM | POA: Diagnosis not present

## 2015-04-17 DIAGNOSIS — I10 Essential (primary) hypertension: Secondary | ICD-10-CM | POA: Diagnosis not present

## 2015-04-17 DIAGNOSIS — Z79899 Other long term (current) drug therapy: Secondary | ICD-10-CM | POA: Diagnosis not present

## 2015-04-17 DIAGNOSIS — E785 Hyperlipidemia, unspecified: Secondary | ICD-10-CM | POA: Diagnosis not present

## 2015-04-17 DIAGNOSIS — Z7901 Long term (current) use of anticoagulants: Secondary | ICD-10-CM | POA: Diagnosis not present

## 2015-04-17 DIAGNOSIS — H04551 Acquired stenosis of right nasolacrimal duct: Secondary | ICD-10-CM | POA: Diagnosis not present

## 2015-04-17 DIAGNOSIS — Z9049 Acquired absence of other specified parts of digestive tract: Secondary | ICD-10-CM | POA: Diagnosis not present

## 2015-04-17 DIAGNOSIS — E119 Type 2 diabetes mellitus without complications: Secondary | ICD-10-CM | POA: Diagnosis not present

## 2015-04-17 DIAGNOSIS — Z86711 Personal history of pulmonary embolism: Secondary | ICD-10-CM | POA: Diagnosis not present

## 2015-04-17 DIAGNOSIS — Z7982 Long term (current) use of aspirin: Secondary | ICD-10-CM | POA: Diagnosis not present

## 2015-04-17 DIAGNOSIS — D6859 Other primary thrombophilia: Secondary | ICD-10-CM | POA: Diagnosis not present

## 2015-04-17 DIAGNOSIS — K760 Fatty (change of) liver, not elsewhere classified: Secondary | ICD-10-CM | POA: Diagnosis not present

## 2015-04-17 DIAGNOSIS — H11821 Conjunctivochalasis, right eye: Secondary | ICD-10-CM | POA: Diagnosis not present

## 2015-04-17 DIAGNOSIS — K219 Gastro-esophageal reflux disease without esophagitis: Secondary | ICD-10-CM | POA: Diagnosis not present

## 2015-04-23 ENCOUNTER — Other Ambulatory Visit: Payer: Self-pay | Admitting: Internal Medicine

## 2015-04-23 NOTE — Telephone Encounter (Signed)
Medication refilled on 03/2014. Patient last seen on 03/05/15. Please advise?

## 2015-04-24 NOTE — Telephone Encounter (Signed)
refilled 

## 2015-04-25 ENCOUNTER — Telehealth: Payer: Self-pay | Admitting: *Deleted

## 2015-04-25 ENCOUNTER — Other Ambulatory Visit: Payer: Self-pay | Admitting: Internal Medicine

## 2015-04-25 ENCOUNTER — Encounter: Payer: Self-pay | Admitting: Internal Medicine

## 2015-04-25 ENCOUNTER — Other Ambulatory Visit (INDEPENDENT_AMBULATORY_CARE_PROVIDER_SITE_OTHER): Payer: PPO

## 2015-04-25 DIAGNOSIS — Z7901 Long term (current) use of anticoagulants: Secondary | ICD-10-CM

## 2015-04-25 LAB — PROTIME-INR

## 2015-04-25 NOTE — Telephone Encounter (Signed)
Instructed patient to stop coumadin and repeat Inr on Friday and if any bleeding to please go to the ER. Patient voiced understanding.

## 2015-04-25 NOTE — Telephone Encounter (Signed)
Critical  PT: 97.05  INR: 8.67

## 2015-04-27 ENCOUNTER — Other Ambulatory Visit (INDEPENDENT_AMBULATORY_CARE_PROVIDER_SITE_OTHER): Payer: PPO

## 2015-04-27 ENCOUNTER — Encounter: Payer: Self-pay | Admitting: Internal Medicine

## 2015-04-27 DIAGNOSIS — Z7901 Long term (current) use of anticoagulants: Secondary | ICD-10-CM | POA: Diagnosis not present

## 2015-04-27 LAB — PROTIME-INR
INR: 1.1 ratio — ABNORMAL HIGH (ref 0.8–1.0)
Prothrombin Time: 12 s (ref 9.6–13.1)

## 2015-05-01 ENCOUNTER — Other Ambulatory Visit (INDEPENDENT_AMBULATORY_CARE_PROVIDER_SITE_OTHER): Payer: PPO

## 2015-05-01 DIAGNOSIS — Z7901 Long term (current) use of anticoagulants: Secondary | ICD-10-CM | POA: Diagnosis not present

## 2015-05-01 LAB — PROTIME-INR
INR: 1 ratio (ref 0.8–1.0)
Prothrombin Time: 10.2 s (ref 9.6–13.1)

## 2015-05-02 ENCOUNTER — Encounter: Payer: Self-pay | Admitting: Internal Medicine

## 2015-05-02 DIAGNOSIS — M25562 Pain in left knee: Secondary | ICD-10-CM | POA: Diagnosis not present

## 2015-05-02 DIAGNOSIS — M222X2 Patellofemoral disorders, left knee: Secondary | ICD-10-CM | POA: Diagnosis not present

## 2015-05-04 DIAGNOSIS — M25562 Pain in left knee: Secondary | ICD-10-CM | POA: Diagnosis not present

## 2015-05-04 DIAGNOSIS — M222X2 Patellofemoral disorders, left knee: Secondary | ICD-10-CM | POA: Diagnosis not present

## 2015-05-07 ENCOUNTER — Other Ambulatory Visit (INDEPENDENT_AMBULATORY_CARE_PROVIDER_SITE_OTHER): Payer: PPO

## 2015-05-07 DIAGNOSIS — Z7901 Long term (current) use of anticoagulants: Secondary | ICD-10-CM

## 2015-05-07 LAB — PROTIME-INR
INR: 2.7 ratio — ABNORMAL HIGH (ref 0.8–1.0)
PROTHROMBIN TIME: 29.2 s — AB (ref 9.6–13.1)

## 2015-05-08 ENCOUNTER — Encounter: Payer: Self-pay | Admitting: Internal Medicine

## 2015-05-08 DIAGNOSIS — M25562 Pain in left knee: Secondary | ICD-10-CM | POA: Diagnosis not present

## 2015-05-08 DIAGNOSIS — M222X2 Patellofemoral disorders, left knee: Secondary | ICD-10-CM | POA: Diagnosis not present

## 2015-05-09 ENCOUNTER — Other Ambulatory Visit: Payer: Self-pay | Admitting: Internal Medicine

## 2015-05-09 DIAGNOSIS — Z7901 Long term (current) use of anticoagulants: Secondary | ICD-10-CM

## 2015-05-11 DIAGNOSIS — M222X2 Patellofemoral disorders, left knee: Secondary | ICD-10-CM | POA: Diagnosis not present

## 2015-05-11 DIAGNOSIS — M25562 Pain in left knee: Secondary | ICD-10-CM | POA: Diagnosis not present

## 2015-05-16 ENCOUNTER — Other Ambulatory Visit: Payer: PPO

## 2015-05-17 ENCOUNTER — Other Ambulatory Visit (INDEPENDENT_AMBULATORY_CARE_PROVIDER_SITE_OTHER): Payer: PPO

## 2015-05-17 DIAGNOSIS — Z7901 Long term (current) use of anticoagulants: Secondary | ICD-10-CM

## 2015-05-17 LAB — PROTIME-INR
INR: 1.7 ratio — ABNORMAL HIGH (ref 0.8–1.0)
PROTHROMBIN TIME: 17.7 s — AB (ref 9.6–13.1)

## 2015-05-19 ENCOUNTER — Encounter: Payer: Self-pay | Admitting: Internal Medicine

## 2015-05-21 ENCOUNTER — Other Ambulatory Visit: Payer: Self-pay | Admitting: Internal Medicine

## 2015-05-21 MED ORDER — WARFARIN SODIUM 1 MG PO TABS: 1.0000 mg | ORAL_TABLET | Freq: Every day | ORAL | Status: DC

## 2015-05-27 ENCOUNTER — Other Ambulatory Visit: Payer: Self-pay | Admitting: Internal Medicine

## 2015-05-31 ENCOUNTER — Encounter: Payer: Self-pay | Admitting: Internal Medicine

## 2015-05-31 ENCOUNTER — Ambulatory Visit (INDEPENDENT_AMBULATORY_CARE_PROVIDER_SITE_OTHER): Payer: PPO | Admitting: Internal Medicine

## 2015-05-31 VITALS — BP 126/80 | HR 71 | Temp 98.2°F | Resp 12 | Ht 61.0 in | Wt 143.5 lb

## 2015-05-31 DIAGNOSIS — Z23 Encounter for immunization: Secondary | ICD-10-CM

## 2015-05-31 DIAGNOSIS — E663 Overweight: Secondary | ICD-10-CM

## 2015-05-31 DIAGNOSIS — Z7901 Long term (current) use of anticoagulants: Secondary | ICD-10-CM | POA: Diagnosis not present

## 2015-05-31 DIAGNOSIS — E785 Hyperlipidemia, unspecified: Secondary | ICD-10-CM | POA: Diagnosis not present

## 2015-05-31 DIAGNOSIS — E119 Type 2 diabetes mellitus without complications: Secondary | ICD-10-CM | POA: Diagnosis not present

## 2015-05-31 DIAGNOSIS — I10 Essential (primary) hypertension: Secondary | ICD-10-CM

## 2015-05-31 LAB — COMPREHENSIVE METABOLIC PANEL
ALK PHOS: 88 U/L (ref 39–117)
ALT: 23 U/L (ref 0–35)
AST: 21 U/L (ref 0–37)
Albumin: 4.2 g/dL (ref 3.5–5.2)
BILIRUBIN TOTAL: 0.4 mg/dL (ref 0.2–1.2)
BUN: 16 mg/dL (ref 6–23)
CALCIUM: 9.3 mg/dL (ref 8.4–10.5)
CO2: 32 meq/L (ref 19–32)
Chloride: 105 mEq/L (ref 96–112)
Creatinine, Ser: 0.89 mg/dL (ref 0.40–1.20)
GFR: 67.41 mL/min (ref >60.00)
GLUCOSE: 96 mg/dL (ref 70–99)
POTASSIUM: 4.3 meq/L (ref 3.5–5.1)
Sodium: 141 mEq/L (ref 135–145)
Total Protein: 6.4 g/dL (ref 6.0–8.3)

## 2015-05-31 LAB — HEMOGLOBIN A1C: Hgb A1c MFr Bld: 6.3 % (ref 4.6–6.5)

## 2015-05-31 LAB — PROTIME-INR
INR: 1.4 ratio — ABNORMAL HIGH (ref 0.8–1.0)
Prothrombin Time: 15 s — ABNORMAL HIGH (ref 9.6–13.1)

## 2015-05-31 LAB — LIPID PANEL
CHOLESTEROL: 165 mg/dL (ref 0–200)
HDL: 43.2 mg/dL (ref >39.00)
LDL CALC: 99 mg/dL (ref 0–99)
NonHDL: 121.34
TRIGLYCERIDES: 111 mg/dL (ref 0.0–149.0)
Total CHOL/HDL Ratio: 4
VLDL: 22.2 mg/dL (ref 0.0–40.0)

## 2015-05-31 LAB — LDL CHOLESTEROL, DIRECT: LDL DIRECT: 91 mg/dL

## 2015-05-31 MED ORDER — FUROSEMIDE 20 MG PO TABS: 20.0000 mg | ORAL_TABLET | Freq: Every day | ORAL | Status: DC

## 2015-05-31 MED ORDER — TRAMADOL HCL 50 MG PO TABS: 50.0000 mg | ORAL_TABLET | Freq: Three times a day (TID) | ORAL | Status: DC | PRN

## 2015-05-31 NOTE — Patient Instructions (Signed)
  You might want to try a premixed protein drink called Premier Protein shake in the morning.  It is less $$$ and very low sugar.  Instead of skipping a meal.  160 cal  30 g protein  1 g sugar 50% calcium needs   KIND BARS FOR DAYTIME SNACKS  OR MEAL REPLACEMENT  Get the 5 g sugar variety

## 2015-05-31 NOTE — Progress Notes (Signed)
Subjective:  Patient ID: [name], female    DOB: [birth year]  Age: 65 y.o. MRN: [id number]  CC: The primary encounter diagnosis was Long term current use of anticoagulant therapy. Diagnoses of Diabetes mellitus without complication ([place]), Hyperlipidemia, Need for prophylactic vaccination against Streptococcus pneumoniae (pneumococcus), Overweight (BMI 25.0-29.9), Diabetes mellitus type 2, diet-controlled ([place]), Essential hypertension, and Hyperlipidemia LDL goal <100 were also pertinent to this visit.  HPI KALYNA MICHELS presents for follow up on DM diet controlled, hypertension, long term anticoagulation , and overweight  Not exercising due to persistent pain in  left knee . Steroid shot by [facility] which only gave her 3 weeks of relief.  Did PT, now doing home exercises but not very diligently.  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  Having recurrent blepharitis  Right greater than left. Next appt in June.   Not checking  Sugars.  Has had several hypoglycemic episodes that were hallmarked by diaphoresis and dizziness.  She is taking tylenol and prm motrin for knee pain , icing it.     Long term anticoagulation:  She is resigned to continuing warfarin rather than the new medications despite recent difficulty maintining therapeutic INR due to fear of change.  She is taking 6 mg coumadin daily.    Outpatient Prescriptions Prior to Visit  Medication Sig Dispense Refill  . ALPRAZolam (XANAX) 0.25 MG tablet Take 1 tablet (0.25 mg total) by mouth at bedtime as needed for anxiety. 30 tablet 3  . amLODipine (NORVASC) 5 MG tablet TAKE ONE TABLET BY MOUTH ONCE DAILY 30 tablet 4  . aspirin 81 MG tablet Take 81 mg by mouth daily.    . butalbital-acetaminophen-caffeine (FIORICET, ESGIC) 50-325-40 MG tablet TAKE ONE TABLET BY MOUTH EVERY 6 HOURS AS NEEDED FOR HEADACHE 90 tablet 0  . Cholecalciferol (VITAMIN D3) 1000 UNITS CAPS Take by mouth.    Marland Kitchen imipramine (TOFRANIL)  25 MG tablet TAKE ONE TABLET BY MOUTH AT BEDTIME 30 tablet 5  . metoprolol succinate (TOPROL-XL) 25 MG 24 hr tablet TAKE ONE TABLET BY MOUTH ONCE DAILY 90 tablet 3  . omeprazole (PRILOSEC) 20 MG capsule Take 20 mg by mouth daily.    . simvastatin (ZOCOR) 40 MG tablet Take 1 tablet by mouth at  bedtime 90 tablet 0  . warfarin (COUMADIN) 1 MG tablet Take 1 tablet (1 mg total) by mouth daily. 30 tablet 2  . warfarin (COUMADIN) 5 MG tablet TAKE ONE TABLET BY MOUTH ONCE DAILY 90 tablet 3  . furosemide (LASIX) 20 MG tablet Take 1 tablet (20 mg total) by mouth daily. 30 tablet 0  . zoster vaccine live, PF, (ZOSTAVAX) 29562 UNT/0.65ML injection Inject 19,400 Units into the skin once. (Patient not taking: Reported on 12/05/2014) 1 each 0  . enoxaparin (LOVENOX) 80 MG/0.8ML injection Inject 0.7 mLs (70 mg total) into the skin every 12 (twelve) hours. (Patient not taking: Reported on 05/31/2015) 6 Syringe 0   No facility-administered medications prior to visit.    Review of Systems;  Patient denies headache, fevers, malaise, unintentional weight loss, skin rash, eye pain, sinus congestion and sinus pain, sore throat, dysphagia,  hemoptysis , cough, dyspnea, wheezing, chest pain, palpitations, orthopnea, edema, abdominal pain, nausea, melena, diarrhea, constipation, flank pain, dysuria, hematuria, urinary  Frequency, nocturia, numbness, tingling, seizures,  Focal weakness, Loss of consciousness,  Tremor, insomnia, depression, anxiety, and suicidal ideation.      Objective:  BP 126/80 mmHg  Pulse 71  Temp(Src) 98.2 F (36.8 C) (Oral)  Resp 12  Ht 5\' 1"  (1.549 m)  Wt 143 lb 8 oz (65.091 kg)  BMI 27.13 kg/m2  SpO2 98%  BP Readings from Last 3 Encounters:  05/31/15 126/80  03/05/15 120/78  12/05/14 136/80    Wt Readings from Last 3 Encounters:  05/31/15 143 lb 8 oz (65.091 kg)  03/05/15 151 lb 4 oz (68.607 kg)  12/05/14 167 lb 4 oz (75.864 kg)    General appearance: alert, cooperative and  appears stated age Ears: normal TM's and external ear canals both ears Throat: lips, mucosa, and tongue normal; teeth and gums normal Neck: no adenopathy, no carotid bruit, supple, symmetrical, trachea midline and thyroid not enlarged, symmetric, no tenderness/mass/nodules Back: symmetric, no curvature. ROM normal. No CVA tenderness. Lungs: clear to auscultation bilaterally Heart: regular rate and rhythm, S1, S2 normal, no murmur, click, rub or gallop Abdomen: soft, non-tender; bowel sounds normal; no masses,  no organomegaly Pulses: 2+ and symmetric Skin: Skin color, texture, turgor normal. No rashes or lesions Lymph nodes: Cervical, supraclavicular, and axillary nodes normal.  Lab Results  Component Value Date   HGBA1C 6.3 05/31/2015   HGBA1C 6.1 02/26/2015   HGBA1C 6.2 12/05/2014    Lab Results  Component Value Date   CREATININE 0.89 05/31/2015   CREATININE 0.92 02/26/2015   CREATININE 1.11 12/05/2014    Lab Results  Component Value Date   WBC 6.0 12/05/2014   HGB 13.9 12/05/2014   HCT 42.3 12/05/2014   PLT 204.0 12/05/2014   GLUCOSE 96 05/31/2015   CHOL 165 05/31/2015   TRIG 111.0 05/31/2015   HDL 43.20 05/31/2015   LDLDIRECT 91.0 05/31/2015   LDLCALC 99 05/31/2015   ALT 23 05/31/2015   AST 21 05/31/2015   NA 141 05/31/2015   K 4.3 05/31/2015   CL 105 05/31/2015   CREATININE 0.89 05/31/2015   BUN 16 05/31/2015   CO2 32 05/31/2015   TSH 1.27 12/05/2014   INR 1.4* 05/31/2015   HGBA1C 6.3 05/31/2015   MICROALBUR <0.7 03/05/2015    No results found.  Assessment & Plan:   Problem List Items Addressed This Visit    Hypertension    Well controlled on current regimen. Renal function stable, no changes today.  Lab Results  Component Value Date   CREATININE 0.89 05/31/2015   Lab Results  Component Value Date   NA 141 05/31/2015   K 4.3 05/31/2015   CL 105 05/31/2015   CO2 32 05/31/2015           Relevant Medications   furosemide (LASIX) 20 MG  tablet   Long term current use of anticoagulant therapy - Primary    Recent loss of therapeutic INR may be due ot use of motrin.  INR is again low.,  Will increas dose to 7 mg daily and repeat in one week  Lab Results  Component Value Date   INR 1.4* 05/31/2015   INR 1.7* 05/17/2015   INR 2.7* 05/07/2015   PROTIME 57.9* 02/17/2014         Relevant Orders   INR/PT (Completed)   Overweight (BMI 25.0-29.9)    I have congratulated her in reduction of   BMI and encouraged  Continued weight loss with goal of 10% of body weigh over the next 6 months using a low glycemic index diet and regular exercise a minimum of 5 days per week.        Hyperlipidemia LDL goal <100  LDL and triglycerides are at goal on current medications. She has no side effects and liver enzymes are normal. No changes today .  Lab Results  Component Value Date   CHOL 165 05/31/2015   HDL 43.20 05/31/2015   LDLCALC 99 05/31/2015   LDLDIRECT 91.0 05/31/2015   TRIG 111.0 05/31/2015   CHOLHDL 4 05/31/2015   Lab Results  Component Value Date   ALT 23 05/31/2015   AST 21 05/31/2015   ALKPHOS 88 05/31/2015   BILITOT 0.4 05/31/2015             Relevant Medications   furosemide (LASIX) 20 MG tablet   Diabetes mellitus type 2, diet-controlled ([place])    She has lost weight and is following a  low glycemic index diet and regular participation in an aerobic exercise program.  A1c is now 6.3.  Dietary measures to avoid recurrent hypoglycemia were discussed .    Lab Results  Component Value Date   HGBA1C 6.3 05/31/2015   Lab Results  Component Value Date   MICROALBUR <0.7 03/05/2015            Other Visit Diagnoses    Diabetes mellitus without complication ([place])        Relevant Orders    Comprehensive metabolic panel (Completed)    Hemoglobin A1c (Completed)    Lipid panel (Completed)    Microalbumin / creatinine urine ratio    Hyperlipidemia        Relevant Medications    furosemide (LASIX)  20 MG tablet    Other Relevant Orders    LDL cholesterol, direct (Completed)    Need for prophylactic vaccination against Streptococcus pneumoniae (pneumococcus)        Relevant Orders    Pneumococcal polysaccharide vaccine 23-valent greater than or equal to 2yo subcutaneous/IM (Completed)      A total of 25 minutes of face to face time was spent with patient more than half of which was spent in counselling and coordination of care    I have discontinued Ms. Oguinn's enoxaparin. I am also having her start on traMADol. Additionally, I am having her maintain her omeprazole, Vitamin D3, aspirin, zoster vaccine live (PF), simvastatin, warfarin, amLODipine, ALPRAZolam, imipramine, metoprolol succinate, butalbital-acetaminophen-caffeine, warfarin, and furosemide.  Meds ordered this encounter  Medications  . traMADol (ULTRAM) 50 MG tablet    Sig: Take 1 tablet (50 mg total) by mouth every 8 (eight) hours as needed.    Dispense:  90 tablet    Refill:  4  . furosemide (LASIX) 20 MG tablet    Sig: Take 1 tablet (20 mg total) by mouth daily.    Dispense:  90 tablet    Refill:  1    Medications Discontinued During This Encounter  Medication Reason  . enoxaparin (LOVENOX) 80 MG/0.8ML injection Change in therapy  . furosemide (LASIX) 20 MG tablet Reorder    Follow-up: Return in about 3 months (around 08/31/2015) for follow up diabetes, inr one month .   Crecencio Mc, MD

## 2015-06-01 ENCOUNTER — Telehealth: Payer: Self-pay

## 2015-06-01 NOTE — Telephone Encounter (Signed)
PA for Butal/Actamn/cf 50-325-40 tablets completed on cover my meds.  Awaiting response.

## 2015-06-02 NOTE — Assessment & Plan Note (Signed)
LDL and triglycerides are at goal on current medications. She has no side effects and liver enzymes are normal. No changes today .  Lab Results  Component Value Date   CHOL 165 05/31/2015   HDL 43.20 05/31/2015   LDLCALC 99 05/31/2015   LDLDIRECT 91.0 05/31/2015   TRIG 111.0 05/31/2015   CHOLHDL 4 05/31/2015   Lab Results  Component Value Date   ALT 23 05/31/2015   AST 21 05/31/2015   ALKPHOS 88 05/31/2015   BILITOT 0.4 05/31/2015

## 2015-06-02 NOTE — Assessment & Plan Note (Signed)
Well controlled on current regimen. Renal function stable, no changes today.  Lab Results  Component Value Date   CREATININE 0.89 05/31/2015   Lab Results  Component Value Date   NA 141 05/31/2015   K 4.3 05/31/2015   CL 105 05/31/2015   CO2 32 05/31/2015

## 2015-06-02 NOTE — Assessment & Plan Note (Signed)
Recent loss of therapeutic INR may be due ot use of motrin.  INR is again low.,  Will increas dose to 7 mg daily and repeat in one week  Lab Results  Component Value Date   INR 1.4* 05/31/2015   INR 1.7* 05/17/2015   INR 2.7* 05/07/2015   PROTIME 57.9* 02/17/2014

## 2015-06-02 NOTE — Assessment & Plan Note (Signed)
I have congratulated her in reduction of   BMI and encouraged  Continued weight loss with goal of 10% of body weigh over the next 6 months using a low glycemic index diet and regular exercise a minimum of 5 days per week.    

## 2015-06-02 NOTE — Assessment & Plan Note (Signed)
She has lost weight and is following a  low glycemic index diet and regular participation in an aerobic exercise program.  A1c is now 6.3.  Dietary measures to avoid recurrent hypoglycemia were discussed .    Lab Results  Component Value Date   HGBA1C 6.3 05/31/2015   Lab Results  Component Value Date   MICROALBUR <0.7 03/05/2015

## 2015-06-05 NOTE — Telephone Encounter (Signed)
PA approved from 06/01/2015-01/06/2016.

## 2015-06-08 ENCOUNTER — Other Ambulatory Visit (INDEPENDENT_AMBULATORY_CARE_PROVIDER_SITE_OTHER): Payer: PPO

## 2015-06-08 DIAGNOSIS — Z7901 Long term (current) use of anticoagulants: Secondary | ICD-10-CM | POA: Diagnosis not present

## 2015-06-08 LAB — PROTIME-INR
INR: 2.4 ratio — ABNORMAL HIGH (ref 0.8–1.0)
Prothrombin Time: 25.3 s — ABNORMAL HIGH (ref 9.6–13.1)

## 2015-06-10 ENCOUNTER — Encounter: Payer: Self-pay | Admitting: Internal Medicine

## 2015-06-13 ENCOUNTER — Other Ambulatory Visit: Payer: Self-pay | Admitting: *Deleted

## 2015-06-13 MED ORDER — WARFARIN SODIUM 1 MG PO TABS: ORAL_TABLET | ORAL | Status: DC

## 2015-06-15 NOTE — Telephone Encounter (Signed)
Resent PA due to wrong drug being approved prior, sent for Butal/Actamn/CF. thanks

## 2015-06-18 ENCOUNTER — Telehealth: Payer: Self-pay | Admitting: Internal Medicine

## 2015-06-18 NOTE — Telephone Encounter (Signed)
Pt is calling about her refill on her butalbital-acetaminophen-caffeine (FIORICET, ESGIC) 50-325-40 MG tablet. Please call pt. [name].

## 2015-06-18 NOTE — Telephone Encounter (Signed)
Noted  

## 2015-06-18 NOTE — Telephone Encounter (Signed)
Spoke with the patient, she was going to call the insurance to check her options, thanks

## 2015-06-18 NOTE — Telephone Encounter (Signed)
SPoke with the patient, she spoke with the insurance, was denied due to diagnosis of migraine.  Resubmitted with diagnosis of tension headache.

## 2015-06-18 NOTE — Telephone Encounter (Signed)
PA was denied as drug is considered a high risk and not used for a approved diagnosis.  Please advise. thanks

## 2015-06-18 NOTE — Telephone Encounter (Signed)
Please let patient know.

## 2015-06-25 NOTE — Telephone Encounter (Signed)
PA was Approved from 06/19/2015-01/06/2016

## 2015-07-01 ENCOUNTER — Other Ambulatory Visit: Payer: Self-pay | Admitting: Internal Medicine

## 2015-07-11 ENCOUNTER — Telehealth: Payer: Self-pay

## 2015-07-11 ENCOUNTER — Other Ambulatory Visit (INDEPENDENT_AMBULATORY_CARE_PROVIDER_SITE_OTHER): Payer: PPO

## 2015-07-11 DIAGNOSIS — Z7901 Long term (current) use of anticoagulants: Secondary | ICD-10-CM | POA: Diagnosis not present

## 2015-07-11 NOTE — Telephone Encounter (Signed)
[facility] unable to run PT-INR. Pt is aware and returning tomorrow for re-draw.

## 2015-07-11 NOTE — Addendum Note (Signed)
Addended by: Frutoso Chase A on: 07/11/2015 03:06 PM   Modules accepted: Orders

## 2015-07-12 ENCOUNTER — Other Ambulatory Visit: Payer: Self-pay | Admitting: Internal Medicine

## 2015-07-12 ENCOUNTER — Other Ambulatory Visit (INDEPENDENT_AMBULATORY_CARE_PROVIDER_SITE_OTHER): Payer: PPO

## 2015-07-12 ENCOUNTER — Encounter: Payer: Self-pay | Admitting: Internal Medicine

## 2015-07-12 DIAGNOSIS — Z7901 Long term (current) use of anticoagulants: Secondary | ICD-10-CM

## 2015-07-12 LAB — PROTIME-INR
INR: 3.4 ratio — AB (ref 0.8–1.0)
PROTHROMBIN TIME: 36.5 s — AB (ref 9.6–13.1)

## 2015-07-26 ENCOUNTER — Other Ambulatory Visit (INDEPENDENT_AMBULATORY_CARE_PROVIDER_SITE_OTHER): Payer: PPO

## 2015-07-26 DIAGNOSIS — Z7901 Long term (current) use of anticoagulants: Secondary | ICD-10-CM

## 2015-07-26 LAB — PROTIME-INR
INR: 3.3 ratio — ABNORMAL HIGH (ref 0.8–1.0)
Prothrombin Time: 35.4 s — ABNORMAL HIGH (ref 9.6–13.1)

## 2015-07-27 ENCOUNTER — Encounter: Payer: Self-pay | Admitting: Internal Medicine

## 2015-08-10 ENCOUNTER — Encounter: Payer: Self-pay | Admitting: Internal Medicine

## 2015-08-10 DIAGNOSIS — E119 Type 2 diabetes mellitus without complications: Secondary | ICD-10-CM | POA: Diagnosis not present

## 2015-08-10 LAB — HM DIABETES EYE EXAM

## 2015-08-28 ENCOUNTER — Other Ambulatory Visit: Payer: Self-pay

## 2015-08-28 DIAGNOSIS — Z7901 Long term (current) use of anticoagulants: Principal | ICD-10-CM

## 2015-08-28 DIAGNOSIS — Z5181 Encounter for therapeutic drug level monitoring: Secondary | ICD-10-CM

## 2015-08-29 ENCOUNTER — Other Ambulatory Visit (INDEPENDENT_AMBULATORY_CARE_PROVIDER_SITE_OTHER): Payer: PPO

## 2015-08-29 DIAGNOSIS — Z7901 Long term (current) use of anticoagulants: Secondary | ICD-10-CM | POA: Diagnosis not present

## 2015-08-29 DIAGNOSIS — Z5181 Encounter for therapeutic drug level monitoring: Secondary | ICD-10-CM

## 2015-08-29 LAB — PROTIME-INR
INR: 3.4 ratio — ABNORMAL HIGH (ref 0.8–1.0)
Prothrombin Time: 36.7 s — ABNORMAL HIGH (ref 9.6–13.1)

## 2015-08-30 ENCOUNTER — Encounter: Payer: Self-pay | Admitting: Internal Medicine

## 2015-09-03 ENCOUNTER — Ambulatory Visit (INDEPENDENT_AMBULATORY_CARE_PROVIDER_SITE_OTHER): Payer: PPO | Admitting: Internal Medicine

## 2015-09-03 VITALS — BP 122/82 | HR 78 | Temp 98.4°F | Resp 12 | Ht 61.0 in | Wt 145.5 lb

## 2015-09-03 DIAGNOSIS — E785 Hyperlipidemia, unspecified: Secondary | ICD-10-CM

## 2015-09-03 DIAGNOSIS — E119 Type 2 diabetes mellitus without complications: Secondary | ICD-10-CM | POA: Insufficient documentation

## 2015-09-03 DIAGNOSIS — E1169 Type 2 diabetes mellitus with other specified complication: Secondary | ICD-10-CM

## 2015-09-03 DIAGNOSIS — I10 Essential (primary) hypertension: Secondary | ICD-10-CM | POA: Diagnosis not present

## 2015-09-03 DIAGNOSIS — Z23 Encounter for immunization: Secondary | ICD-10-CM | POA: Diagnosis not present

## 2015-09-03 LAB — LIPID PANEL
CHOLESTEROL: 201 mg/dL — AB (ref 0–200)
HDL: 52.6 mg/dL (ref >39.00)
LDL Cholesterol: 118 mg/dL — ABNORMAL HIGH (ref 0–99)
NonHDL: 148.29
Total CHOL/HDL Ratio: 4
Triglycerides: 149 mg/dL (ref 0.0–149.0)
VLDL: 29.8 mg/dL (ref 0.0–40.0)

## 2015-09-03 LAB — COMPREHENSIVE METABOLIC PANEL
ALBUMIN: 4.1 g/dL (ref 3.5–5.2)
ALK PHOS: 87 U/L (ref 39–117)
ALT: 17 U/L (ref 0–35)
AST: 18 U/L (ref 0–37)
BILIRUBIN TOTAL: 0.4 mg/dL (ref 0.2–1.2)
BUN: 13 mg/dL (ref 6–23)
CO2: 31 mEq/L (ref 19–32)
CREATININE: 0.84 mg/dL (ref 0.40–1.20)
Calcium: 8.9 mg/dL (ref 8.4–10.5)
Chloride: 103 mEq/L (ref 96–112)
GFR: 72.01 mL/min (ref >60.00)
Glucose, Bld: 103 mg/dL — ABNORMAL HIGH (ref 70–99)
Potassium: 4.6 mEq/L (ref 3.5–5.1)
SODIUM: 139 meq/L (ref 135–145)
TOTAL PROTEIN: 6.6 g/dL (ref 6.0–8.3)

## 2015-09-03 LAB — MICROALBUMIN / CREATININE URINE RATIO
Creatinine,U: 147.4 mg/dL
Microalb Creat Ratio: 0.5 mg/g (ref 0.0–30.0)

## 2015-09-03 LAB — HEMOGLOBIN A1C: HEMOGLOBIN A1C: 6 % (ref 4.6–6.5)

## 2015-09-03 LAB — LDL CHOLESTEROL, DIRECT: LDL DIRECT: 117 mg/dL

## 2015-09-03 MED ORDER — WARFARIN SODIUM 6 MG PO TABS: 6.0000 mg | ORAL_TABLET | Freq: Every day | ORAL | 1 refills | Status: DC

## 2015-09-03 NOTE — Patient Instructions (Addendum)
LOOK UP THE [employer] [place] water resistance workout  For pool aerobics   [employer] has published a fantastic "Mediterranean Cookbook" complete with nutritional analyses of all recipes  You HAVE to start exercising  For 30 minutes DAILY IF YOU WANT TO GET TO YOUR GOAL

## 2015-09-03 NOTE — Progress Notes (Signed)
Pre-visit discussion using our clinic review tool. No additional management support is needed unless otherwise documented below in the visit note.  

## 2015-09-03 NOTE — Progress Notes (Signed)
Subjective:  Patient ID: [name], female    DOB: 1950/09/09  Age: 65 y.o. MRN: [id number]  CC: The primary encounter diagnosis was Diabetes mellitus without complication ([place]). Diagnoses of Hyperlipidemia associated with type 2 diabetes mellitus ([place]), Encounter for immunization, Essential hypertension, and Diabetes mellitus type 2, diet-controlled ([place]) were also pertinent to this visit.  HPI [name] presents for 3 month follow up on Type 2 DM,complicated by obesity,   Hypertension, and hyperlipidemia.  She had an intentional weight loss of over 20 lbs in the Spring but has regained 2 lbs. She is reducing the sugar in her diet.    She has been keeping her carbohydrate content well under the recommended  45 carbs per meal by eliminating sweetened tea and soda and cutting out bagels for breakfast.    However she is tiring  Of the limited choices she feels she has on the low carb diet.  Wants more variety.  Not exercising despite being retired.  No motivation to exercise,  No joint complaints or history of cardiomyopathy.  Has gym equipment at home.   On chronic coumadin: Taking 6 mg daily since 8/23 for INR 3.4      Outpatient Medications Prior to Visit  Medication Sig Dispense Refill  . ALPRAZolam (XANAX) 0.25 MG tablet Take 1 tablet (0.25 mg total) by mouth at bedtime as needed for anxiety. 30 tablet 3  . amLODipine (NORVASC) 5 MG tablet TAKE ONE TABLET BY MOUTH ONCE DAILY 30 tablet 5  . aspirin 81 MG tablet Take 81 mg by mouth daily.    . butalbital-acetaminophen-caffeine (FIORICET, ESGIC) 50-325-40 MG tablet TAKE ONE TABLET BY MOUTH EVERY 6 HOURS AS NEEDED FOR HEADACHE 90 tablet 0  . Cholecalciferol (VITAMIN D3) 1000 UNITS CAPS Take by mouth.    . furosemide (LASIX) 20 MG tablet Take 1 tablet (20 mg total) by mouth daily. 90 tablet 1  . imipramine (TOFRANIL) 25 MG tablet TAKE ONE TABLET BY MOUTH AT BEDTIME 30 tablet 5  . metoprolol succinate (TOPROL-XL) 25 MG 24 hr tablet TAKE  ONE TABLET BY MOUTH ONCE DAILY 90 tablet 3  . omeprazole (PRILOSEC) 20 MG capsule Take 20 mg by mouth daily.    . simvastatin (ZOCOR) 40 MG tablet Take 1 tablet by mouth at  bedtime 90 tablet 0  . traMADol (ULTRAM) 50 MG tablet Take 1 tablet (50 mg total) by mouth every 8 (eight) hours as needed. 90 tablet 4  . zoster vaccine live, PF, (ZOSTAVAX) 60454 UNT/0.65ML injection Inject 19,400 Units into the skin once. 1 each 0  . warfarin (COUMADIN) 1 MG tablet 1-2 tabs daily per MD and INR 60 tablet 2  . warfarin (COUMADIN) 5 MG tablet TAKE ONE TABLET BY MOUTH ONCE DAILY 90 tablet 3  . simvastatin (ZOCOR) 40 MG tablet TAKE ONE TABLET BY MOUTH AT BEDTIME 30 tablet 5   No facility-administered medications prior to visit.     Review of Systems;  Patient denies headache, fevers, malaise, unintentional weight loss, skin rash, eye pain, sinus congestion and sinus pain, sore throat, dysphagia,  hemoptysis , cough, dyspnea, wheezing, chest pain, palpitations, orthopnea, edema, abdominal pain, nausea, melena, diarrhea, constipation, flank pain, dysuria, hematuria, urinary  Frequency, nocturia, numbness, tingling, seizures,  Focal weakness, Loss of consciousness,  Tremor, insomnia, depression, anxiety, and suicidal ideation.      Objective:  BP 122/82   Pulse 78   Temp 98.4 F (36.9 C) (Oral)   Resp 12  Ht 5\' 1"  (1.549 m)   Wt 145 lb 8 oz (66 kg)   SpO2 97%   BMI 27.49 kg/m   BP Readings from Last 3 Encounters:  09/03/15 122/82  05/31/15 126/80  03/05/15 120/78    Wt Readings from Last 3 Encounters:  09/03/15 145 lb 8 oz (66 kg)  05/31/15 143 lb 8 oz (65.1 kg)  03/05/15 151 lb 4 oz (68.6 kg)    General appearance: alert, cooperative and appears stated age Ears: normal TM's and external ear canals both ears Throat: lips, mucosa, and tongue normal; teeth and gums normal Neck: no adenopathy, no carotid bruit, supple, symmetrical, trachea midline and thyroid not enlarged, symmetric, no  tenderness/mass/nodules Back: symmetric, no curvature. ROM normal. No CVA tenderness. Lungs: clear to auscultation bilaterally Heart: regular rate and rhythm, S1, S2 normal, no murmur, click, rub or gallop Abdomen: soft, non-tender; bowel sounds normal; no masses,  no organomegaly Pulses: 2+ and symmetric Skin: Skin color, texture, turgor normal. No rashes or lesions Lymph nodes: Cervical, supraclavicular, and axillary nodes normal.  Lab Results  Component Value Date   HGBA1C 6.0 09/03/2015   HGBA1C 6.3 05/31/2015   HGBA1C 6.1 02/26/2015    Lab Results  Component Value Date   CREATININE 0.84 09/03/2015   CREATININE 0.89 05/31/2015   CREATININE 0.92 02/26/2015    Lab Results  Component Value Date   WBC 6.0 12/05/2014   HGB 13.9 12/05/2014   HCT 42.3 12/05/2014   PLT 204.0 12/05/2014   GLUCOSE 103 (H) 09/03/2015   CHOL 201 (H) 09/03/2015   TRIG 149.0 09/03/2015   HDL 52.60 09/03/2015   LDLDIRECT 117.0 09/03/2015   LDLCALC 118 (H) 09/03/2015   ALT 17 09/03/2015   AST 18 09/03/2015   NA 139 09/03/2015   K 4.6 09/03/2015   CL 103 09/03/2015   CREATININE 0.84 09/03/2015   BUN 13 09/03/2015   CO2 31 09/03/2015   TSH 1.27 12/05/2014   INR 3.4 (H) 08/29/2015   HGBA1C 6.0 09/03/2015   MICROALBUR <0.7 09/03/2015    No results found.  Assessment & Plan:   Problem List Items Addressed This Visit    Diabetes mellitus type 2, diet-controlled ([place])    She has lost weight and is following a  low glycemic index diet but doe snot have  participation in an aerobic exercise program.  A1c is now 6.0.  Dietary measures to avoid recurrent hypoglycemia were discussed .  Liberalization discussed as a possibility if she were to engage in regular exercise. Taking an aspirin and simvastatin daily   Lab Results  Component Value Date   HGBA1C 6.0 09/03/2015   Lab Results  Component Value Date   MICROALBUR <0.7 09/03/2015           RESOLVED: Diabetes mellitus without  complication ([place]) - Primary   Relevant Orders   Hemoglobin A1c (Completed)   Microalbumin / creatinine urine ratio (Completed)   Comprehensive metabolic panel (Completed)   Hyperlipidemia associated with type 2 diabetes mellitus ([facility])    LDL and triglycerides are at goal on current medications. She has no side effects and liver enzymes are normal. No changes today .  Lab Results  Component Value Date   CHOL 201 (H) 09/03/2015   HDL 52.60 09/03/2015   LDLCALC 118 (H) 09/03/2015   LDLDIRECT 117.0 09/03/2015   TRIG 149.0 09/03/2015   CHOLHDL 4 09/03/2015   Lab Results  Component Value Date   ALT 17 09/03/2015   AST 18  09/03/2015   ALKPHOS 87 09/03/2015   BILITOT 0.4 09/03/2015             Relevant Medications   warfarin (COUMADIN) 6 MG tablet   Other Relevant Orders   Lipid panel (Completed)   LDL cholesterol, direct (Completed)   Hypertension    Well controlled on current regimen. Renal function stable, no changes today.  Lab Results  Component Value Date   CREATININE 0.89 05/31/2015         Relevant Medications   warfarin (COUMADIN) 6 MG tablet    Other Visit Diagnoses    Encounter for immunization       Relevant Orders   Flu vaccine HIGH DOSE PF (Completed)    A total of 25 minutes of face to face time was spent with patient more than half of which was spent in counselling about the above mentioned conditions  and coordination of care   I have discontinued Ms. Creveling's warfarin and warfarin. I am also having her start on warfarin. Additionally, I am having her maintain her omeprazole, Vitamin D3, aspirin, zoster vaccine live (PF), simvastatin, ALPRAZolam, imipramine, metoprolol succinate, butalbital-acetaminophen-caffeine, traMADol, furosemide, and amLODipine.  Meds ordered this encounter  Medications  . warfarin (COUMADIN) 6 MG tablet    Sig: Take 1 tablet (6 mg total) by mouth daily.    Dispense:  90 tablet    Refill:  1    Medications Discontinued  During This Encounter  Medication Reason  . simvastatin (ZOCOR) 40 MG tablet Duplicate  . warfarin (COUMADIN) 5 MG tablet   . warfarin (COUMADIN) 1 MG tablet     Follow-up: Return in about 6 months (around 03/05/2016) for follow up diabetes.   Crecencio Mc, MD

## 2015-09-03 NOTE — Assessment & Plan Note (Signed)
Well controlled on current regimen. Renal function stable, no changes today.  Lab Results  Component Value Date   CREATININE 0.89 05/31/2015

## 2015-09-04 NOTE — Assessment & Plan Note (Signed)
She has lost weight and is following a  low glycemic index diet but doe snot have  participation in an aerobic exercise program.  A1c is now 6.0.  Dietary measures to avoid recurrent hypoglycemia were discussed .  Liberalization discussed as a possibility if she were to engage in regular exercise. Taking an aspirin and simvastatin daily   Lab Results  Component Value Date   HGBA1C 6.0 09/03/2015   Lab Results  Component Value Date   MICROALBUR <0.7 09/03/2015

## 2015-09-04 NOTE — Assessment & Plan Note (Signed)
LDL and triglycerides are at goal on current medications. She has no side effects and liver enzymes are normal. No changes today .  Lab Results  Component Value Date   CHOL 201 (H) 09/03/2015   HDL 52.60 09/03/2015   LDLCALC 118 (H) 09/03/2015   LDLDIRECT 117.0 09/03/2015   TRIG 149.0 09/03/2015   CHOLHDL 4 09/03/2015   Lab Results  Component Value Date   ALT 17 09/03/2015   AST 18 09/03/2015   ALKPHOS 87 09/03/2015   BILITOT 0.4 09/03/2015

## 2015-09-05 ENCOUNTER — Encounter: Payer: Self-pay | Admitting: Internal Medicine

## 2015-09-12 DIAGNOSIS — H01003 Unspecified blepharitis right eye, unspecified eyelid: Secondary | ICD-10-CM | POA: Diagnosis not present

## 2015-09-28 ENCOUNTER — Other Ambulatory Visit: Payer: Self-pay | Admitting: Internal Medicine

## 2015-10-08 ENCOUNTER — Other Ambulatory Visit: Payer: Self-pay

## 2015-10-08 ENCOUNTER — Other Ambulatory Visit (INDEPENDENT_AMBULATORY_CARE_PROVIDER_SITE_OTHER): Payer: PPO

## 2015-10-08 DIAGNOSIS — Z5181 Encounter for therapeutic drug level monitoring: Secondary | ICD-10-CM

## 2015-10-08 DIAGNOSIS — Z7901 Long term (current) use of anticoagulants: Secondary | ICD-10-CM | POA: Diagnosis not present

## 2015-10-08 LAB — PROTIME-INR
INR: 2.3 ratio — ABNORMAL HIGH (ref 0.8–1.0)
Prothrombin Time: 25 s — ABNORMAL HIGH (ref 9.6–13.1)

## 2015-10-10 ENCOUNTER — Encounter: Payer: Self-pay | Admitting: Internal Medicine

## 2015-11-14 ENCOUNTER — Other Ambulatory Visit: Payer: Self-pay | Admitting: *Deleted

## 2015-11-14 DIAGNOSIS — Z7901 Long term (current) use of anticoagulants: Secondary | ICD-10-CM

## 2015-11-14 NOTE — Progress Notes (Unsigned)
Standing order placed for PT/INR 

## 2015-11-15 ENCOUNTER — Other Ambulatory Visit (INDEPENDENT_AMBULATORY_CARE_PROVIDER_SITE_OTHER): Payer: PPO

## 2015-11-15 DIAGNOSIS — Z7901 Long term (current) use of anticoagulants: Secondary | ICD-10-CM | POA: Diagnosis not present

## 2015-11-15 LAB — PROTIME-INR
INR: 2.6 ratio — AB (ref 0.8–1.0)
PROTHROMBIN TIME: 27.7 s — AB (ref 9.6–13.1)

## 2015-11-16 ENCOUNTER — Encounter: Payer: Self-pay | Admitting: Internal Medicine

## 2015-11-22 ENCOUNTER — Emergency Department: Payer: PPO

## 2015-11-22 ENCOUNTER — Telehealth: Payer: Self-pay | Admitting: *Deleted

## 2015-11-22 ENCOUNTER — Emergency Department
Admission: EM | Admit: 2015-11-22 | Discharge: 2015-11-22 | Disposition: A | Discharge status: Home / Self Care | Payer: PPO | Attending: Emergency Medicine | Admitting: Emergency Medicine

## 2015-11-22 ENCOUNTER — Encounter: Payer: Self-pay | Admitting: Emergency Medicine

## 2015-11-22 DIAGNOSIS — I10 Essential (primary) hypertension: Secondary | ICD-10-CM | POA: Insufficient documentation

## 2015-11-22 DIAGNOSIS — R1013 Epigastric pain: Secondary | ICD-10-CM

## 2015-11-22 DIAGNOSIS — Z79899 Other long term (current) drug therapy: Secondary | ICD-10-CM | POA: Insufficient documentation

## 2015-11-22 DIAGNOSIS — R1010 Upper abdominal pain, unspecified: Secondary | ICD-10-CM

## 2015-11-22 DIAGNOSIS — Z7982 Long term (current) use of aspirin: Secondary | ICD-10-CM | POA: Insufficient documentation

## 2015-11-22 DIAGNOSIS — R1011 Right upper quadrant pain: Secondary | ICD-10-CM | POA: Diagnosis not present

## 2015-11-22 DIAGNOSIS — Z7901 Long term (current) use of anticoagulants: Secondary | ICD-10-CM | POA: Diagnosis not present

## 2015-11-22 DIAGNOSIS — E119 Type 2 diabetes mellitus without complications: Secondary | ICD-10-CM | POA: Insufficient documentation

## 2015-11-22 DIAGNOSIS — Z791 Long term (current) use of non-steroidal anti-inflammatories (NSAID): Secondary | ICD-10-CM | POA: Insufficient documentation

## 2015-11-22 DIAGNOSIS — R0789 Other chest pain: Secondary | ICD-10-CM

## 2015-11-22 DIAGNOSIS — R079 Chest pain, unspecified: Secondary | ICD-10-CM | POA: Diagnosis not present

## 2015-11-22 HISTORY — DX: Type 2 diabetes mellitus without complications: E11.9

## 2015-11-22 LAB — LIPASE, BLOOD: Lipase: 21 U/L (ref 11–51)

## 2015-11-22 LAB — CBC
HCT: 47 % (ref 35.0–47.0)
Hemoglobin: 15.5 g/dL (ref 12.0–16.0)
MCH: 28.5 pg (ref 26.0–34.0)
MCHC: 32.9 g/dL (ref 32.0–36.0)
MCV: 86.6 fL (ref 80.0–100.0)
PLATELETS: 163 10*3/uL (ref 150–440)
RBC: 5.43 MIL/uL — ABNORMAL HIGH (ref 3.80–5.20)
RDW: 14.5 % (ref 11.5–14.5)
WBC: 4.5 10*3/uL (ref 3.6–11.0)

## 2015-11-22 LAB — BASIC METABOLIC PANEL
Anion gap: 7 (ref 5–15)
BUN: 12 mg/dL (ref 6–20)
CALCIUM: 9.2 mg/dL (ref 8.9–10.3)
CO2: 31 mmol/L (ref 22–32)
CREATININE: 1.12 mg/dL — AB (ref 0.44–1.00)
Chloride: 102 mmol/L (ref 101–111)
GFR, EST AFRICAN AMERICAN: 58 mL/min — AB (ref >60)
GFR, EST NON AFRICAN AMERICAN: 50 mL/min — AB (ref >60)
Glucose, Bld: 98 mg/dL (ref 65–99)
Potassium: 3.8 mmol/L (ref 3.5–5.1)
Sodium: 140 mmol/L (ref 135–145)

## 2015-11-22 LAB — TROPONIN I

## 2015-11-22 LAB — PROTIME-INR
INR: 2.2
Prothrombin Time: 24.8 seconds — ABNORMAL HIGH (ref 11.4–15.2)

## 2015-11-22 NOTE — ED Triage Notes (Signed)
Patient presents to the ED with intermittent centralized chest pain x 3 weeks.  Patient states pain occasionally comes when she is lying in bed but usually seems to be worse when she is up and moving.  Patient states today chest pain is more consistent today.

## 2015-11-22 NOTE — ED Notes (Signed)

## 2015-11-22 NOTE — ED Notes (Signed)
Patient's husband to the door. States to [employer], Therapist, sports - "She just wants to go home if ya'll aint going to do nothing". Monar, RN to bedside to speak with patient. MD asked to go back in and speak with patient. Dr. Darl Householder to bedside. Will forgo ordered ultrasound at this time; patient to be discharged home.

## 2015-11-22 NOTE — ED Provider Notes (Signed)
[facility] Provider Note   CSN: [id number] Arrival date & time: 11/22/15  1623     History   Chief Complaint Chief Complaint  Patient presents with  . Chest Pain    HPI [name] is a 65 y.o. female hx of DM, HL, HTN, here with chest pain. Patient states that he is been having intermittent chest pain for the last 3 weeks. Patient states that over the last 3 days has been more consistent. Describes it as epigastric and sometimes goes to her back. She states that it is not worse with food but sometimes is worse at night but she is already taking Nexium. Patient denies any shortness of breath. She has previous PE but is on Coumadin and has been therapeutic in the past. Denies any vomiting or fevers. Denies any urinary symptoms.   The history is provided by the patient.    Past Medical History:  Diagnosis Date  . Diabetes mellitus without complication ([place])   . H/O toxic shock syndrome 2001   post urologic procedure for stone removal  . 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 Hyperlipidemia   . Hypertension   . Ischemic colitis ([place]) 2012-2013   s/p left colectomy May 2013  . Kidney stones     Patient Active Problem List   Diagnosis Date Noted  . Hyperlipidemia associated with type 2 diabetes mellitus ([place]) 09/03/2015  . Diabetes mellitus type 2, diet-controlled ([place]) 12/08/2014  . S/P insertion of IVC (inferior vena caval) filter 11/05/2013  . Encounter for preventive health examination 11/05/2013  . History of meniscal tear 11/05/2013  . History of shingles 11/05/2013  . Vitamin D deficiency 11/05/2013  . S/P hysterectomy 11/02/2013  . Overweight (BMI 25.0-29.9) 10/28/2012  . Hyperlipidemia LDL goal <100 10/28/2012  . 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 Long term current use of anticoagulant therapy 07/20/2011  . Chronic ischemic colitis, enteritis, or enterocolitis ([place]) 04/08/2011  .  Hypertension   . H/O toxic shock syndrome   . History of pulmonary embolus (PE)     Past Surgical History:  Procedure Laterality Date  . ABDOMINAL HYSTERECTOMY  1996  . APPENDECTOMY    . bilateral tubal ligation    . BREAST SURGERY  1989   left biopsy, normal  . COLECTOMY Left May 2013   [place], ischemic colitis  . COLONOSCOPY WITH PROPOFOL N/A 11/16/2014   Procedure: COLONOSCOPY WITH PROPOFOL;  Surgeon: Manya Silvas, MD;  Location: [facility] ENDOSCOPY;  Service: Endoscopy;  Laterality: N/A;  . ESOPHAGOGASTRODUODENOSCOPY (EGD) WITH PROPOFOL N/A 11/16/2014   Procedure: ESOPHAGOGASTRODUODENOSCOPY (EGD) WITH PROPOFOL;  Surgeon: Manya Silvas, MD;  Location: [facility] ENDOSCOPY;  Service: Endoscopy;  Laterality: N/A;  . SPINE SURGERY  1984   lumbar diskectomy, [place]  . ureterolithotomy      OB History    No data available       Home Medications    Prior to Admission medications   Medication Sig Start Date End Date Taking? Authorizing Provider  ALPRAZolam (XANAX) 0.25 MG tablet Take 1 tablet (0.25 mg total) by mouth at bedtime as needed for anxiety. 03/05/15   Crecencio Mc, MD  amLODipine (NORVASC) 5 MG tablet TAKE ONE TABLET BY MOUTH ONCE DAILY 07/02/15   Crecencio Mc, MD  aspirin 81 MG tablet Take 81 mg by mouth daily.    Historical Provider, MD  butalbital-acetaminophen-caffeine (FIORICET, ESGIC) 50-325-40 MG tablet TAKE ONE  TABLET BY MOUTH EVERY 6 HOURS AS NEEDED FOR HEADACHE 04/24/15   Crecencio Mc, MD  Cholecalciferol (VITAMIN D3) 1000 UNITS CAPS Take by mouth.    Historical Provider, MD  furosemide (LASIX) 20 MG tablet Take 1 tablet (20 mg total) by mouth daily. 05/31/15   Crecencio Mc, MD  imipramine (TOFRANIL) 25 MG tablet TAKE ONE TABLET BY MOUTH AT BEDTIME 09/28/15   Crecencio Mc, MD  metoprolol succinate (TOPROL-XL) 25 MG 24 hr tablet TAKE ONE TABLET BY MOUTH ONCE DAILY 04/23/15   Crecencio Mc, MD  omeprazole (PRILOSEC) 20 MG capsule Take 20 mg by mouth daily.     Historical Provider, MD  simvastatin (ZOCOR) 40 MG tablet Take 1 tablet by mouth at  bedtime 06/19/14   Crecencio Mc, MD  traMADol (ULTRAM) 50 MG tablet Take 1 tablet (50 mg total) by mouth every 8 (eight) hours as needed. 05/31/15   Crecencio Mc, MD  warfarin (COUMADIN) 6 MG tablet Take 1 tablet (6 mg total) by mouth daily. 09/03/15   Crecencio Mc, MD  zoster vaccine live, PF, (ZOSTAVAX) 29562 UNT/0.65ML injection Inject 19,400 Units into the skin once. 10/27/12   Crecencio Mc, MD    Family History Family History  Problem Relation Age of Onset  . Hypertension Mother   . Heart disease Father   . Hypertension Father     Social History Social History  Substance Use Topics  . Smoking status: Never Smoker  . Smokeless tobacco: Never Used  . Alcohol use No     Allergies   Patient has no known allergies.   Review of Systems Review of Systems  Cardiovascular: Positive for chest pain.  All other systems reviewed and are negative.    Physical Exam Updated Vital Signs BP (!) 164/78 (BP Location: Left Arm)   Pulse 64   Temp 97.8 F (36.6 C) (Oral)   Resp 16   Ht 5' 1.5" (1.562 m)   Wt 139 lb (63 kg)   SpO2 99%   BMI 25.84 kg/m   Physical Exam  Constitutional: She is oriented to person, place, and time. She appears well-developed and well-nourished.  HENT:  Head: Normocephalic.  Mouth/Throat: Oropharynx is clear and moist.  Eyes: EOM are normal. Pupils are equal, round, and reactive to light.  Neck: Normal range of motion. Neck supple.  Cardiovascular: Normal rate, regular rhythm and normal heart sounds.   Pulmonary/Chest: Effort normal and breath sounds normal. No respiratory distress. She has no wheezes. She has no rales.  Abdominal: Soft. Bowel sounds are normal.  Mild epigastric and RUQ tenderness.   Musculoskeletal: Normal range of motion.  Neurological: She is alert and oriented to person, place, and time. She displays normal reflexes. No cranial nerve  deficit. Coordination normal.  Skin: Skin is warm.  Psychiatric: She has a normal mood and affect.  Nursing note and vitals reviewed.    ED Treatments / Results  Labs (all labs ordered are listed, but only abnormal results are displayed) Labs Reviewed  BASIC METABOLIC PANEL - Abnormal; Notable for the following:       Result Value   Creatinine, Ser 1.12 (*)    GFR calc non Af Amer 50 (*)    GFR calc Af Amer 58 (*)    All other components within normal limits  CBC - Abnormal; Notable for the following:    RBC 5.43 (*)    All other components within normal limits  PROTIME-INR -  Abnormal; Notable for the following:    Prothrombin Time 24.8 (*)    All other components within normal limits  TROPONIN I  TROPONIN I  LIPASE, BLOOD    EKG  EKG Interpretation None       ED ECG REPORT I, Wandra Arthurs, the attending physician, personally viewed and interpreted this ECG.   Date: 11/22/2015  EKG Time: 16:37 pm  Rate: 80  Rhythm: normal EKG, normal sinus rhythm  Axis: normal  Intervals:none  ST&T Change: nonspecific    Radiology Dg Chest 2 View  Result Date: 11/22/2015 CLINICAL DATA:  Intermittent central chest pain for 3 weeks. EXAM: CHEST  2 VIEW COMPARISON:  Radiographs and CT May 2013 FINDINGS: The cardiomediastinal contours are normal. The lungs are clear. Pulmonary vasculature is normal. No consolidation, pleural effusion, or pneumothorax. No acute osseous abnormalities are seen. IMPRESSION: No active cardiopulmonary disease. Electronically Signed   By: Jeb Levering M.D.   On: 11/22/2015 18:05    Procedures Procedures (including critical care time)  Medications Ordered in ED Medications - No data to display   Initial Impression / Assessment and Plan / ED Course  I have reviewed the triage vital signs and the nursing notes.  Pertinent labs & imaging results that were available during my care of the patient were reviewed by me and considered in my medical  decision making (see chart for details).  Clinical Course     [name] is a 65 y.o. female here with chest pain, ab pain. Consider ACS vs gastritis vs gallstones. Will get delta trop, INR, CXR, lipase, [place] RUQ.   11:23 PM Patient's delta trop neg. INR therapuetic. Not tachy or hypoxic to suggest PE. Didn't want to wait for [place] and wants to leave. Lipase nl. I told that she should stay but I have low suspicion for acute chole. She can get outpatient [place] if she still has pain. Will dc home.    Final Clinical Impressions(s) / ED Diagnoses   Final diagnoses:  Other chest pain  Pain of upper abdomen    New Prescriptions New Prescriptions   No medications on file     Drenda Freeze, MD 11/22/15 2324

## 2015-11-22 NOTE — Discharge Instructions (Signed)
Continue taking your medicines as prescribed.   You have upper abdominal pain. Recommend ultrasound outpatient if you still have pain.  See your doctor  Return to ER if you have severe chest pain, abdominal pain, vomiting, fever, shortness of breath.

## 2015-11-22 NOTE — Telephone Encounter (Signed)
Noted patient is in the ED.

## 2015-11-22 NOTE — Telephone Encounter (Signed)
Patient was transferred to the Nurse Line She currently has chest pain that has been off and on for 2 weeks. The pains are now consistent.

## 2015-11-22 NOTE — ED Notes (Signed)
CBC collected and sent to lab.

## 2015-11-22 NOTE — Telephone Encounter (Signed)
Lattie Haw from [facility] triage called and stated that she advised pt to go to the Ed. Pt is currently having pressure in her chest, back now and it has been increasingly getting worse 2-3 and was on and off. Pt is refusing to go to the Ed, pt wants to someone to call her and advise her what to do.  Call [facility] @ [phone]   Call pt@ [phone]

## 2015-11-22 NOTE — Telephone Encounter (Signed)
Spoke with patient states she has been having chest patient for 2 weeks and is currently having chest pain with upset stomach. I advised patient that she should call EMS who  could do EKG right away and see heart activity.   Patient agreed to  have her husband take her  ER to be evaluated.   I then called and spoke with Lattie Haw at [facility] and advised her patient was going to ER to be evaluated.

## 2015-11-22 NOTE — ED Notes (Signed)
Ultrasound discontinued; patient does not wish to remain in the ED for MD ordered study.

## 2015-11-27 ENCOUNTER — Other Ambulatory Visit: Payer: Self-pay | Admitting: Internal Medicine

## 2015-11-27 DIAGNOSIS — F409 Phobic anxiety disorder, unspecified: Secondary | ICD-10-CM

## 2015-11-27 DIAGNOSIS — F5105 Insomnia due to other mental disorder: Principal | ICD-10-CM

## 2015-11-27 NOTE — Telephone Encounter (Signed)
Please advise on refill.

## 2015-12-07 ENCOUNTER — Telehealth: Payer: Self-pay | Admitting: Internal Medicine

## 2015-12-07 NOTE — Telephone Encounter (Signed)
Talked with PCP patient ok to be seen Monday due to patient is taking coumadin.

## 2015-12-07 NOTE — Telephone Encounter (Deleted)
Spoke with patient urged her to go to ER  to be evaluated.   Patient states area is warm to touch behind knee and sensitive to touch pain.

## 2015-12-07 NOTE — Telephone Encounter (Signed)
Patient was transferred to team health, see there note and additional notes from [place].

## 2015-12-07 NOTE — Telephone Encounter (Signed)
Pt also called and stated that she had a lump on the back of her knee x 3 days and states that it is warm touch. Sent call to [facility] Triage.   Call pt @ [phone]

## 2015-12-07 NOTE — Telephone Encounter (Signed)
fyi

## 2015-12-07 NOTE — Telephone Encounter (Signed)
[facility] Patient Name: [name] DOB: Apr 28, 1950 Initial Comment Caller states she has a lump on the back of her knee, about the size of a ping-pong ball, feeling warm and sensitive to the touch. Discomfort at times, but not red. Nurse Assessment Nurse: Dimas Chyle, RN, Dellis Filbert Date/Time Eilene Ghazi Time): 12/07/2015 10:10:50 AM Confirm and document reason for call. If symptomatic, describe symptoms. ---Caller states she has a lump on the back of her knee, about the size of a ping-pong ball, feeling warm and sensitive to the touch. Discomfort at times, but not red. Symptoms started about 3 days ago. Does the patient have any new or worsening symptoms? ---Yes Will a triage be completed? ---Yes Related visit to physician within the last 2 weeks? ---No Does the PT have any chronic conditions? (i.e. diabetes, asthma, etc.) ---Yes List chronic conditions. ---Hx of DVT, diabetes type 2, HTN Is this a behavioral health or substance abuse call? ---No Guidelines Guideline Title Affirmed Question Affirmed Notes Skin Lump or Localized Swelling [1] Swelling is red AND [2] size > 2 inches (5.0 cm) (Exception: itchy area of skin) Final Disposition User See Physician within 4 Hours (or PCP triage) Dimas Chyle, RN, [place] Spoke with office and they are trying to work patient in to be seen and they advised she would be getting callback from office at some point. Referrals REFERRED TO PCP OFFICE Disagree/Comply: Comply

## 2015-12-07 NOTE — Telephone Encounter (Signed)
P called and needs to make a hospital follow up for chest pain. Please advise, thank you!  Call pt @ [phone]

## 2015-12-07 NOTE — Telephone Encounter (Signed)
See below--please advise

## 2015-12-10 ENCOUNTER — Ambulatory Visit (INDEPENDENT_AMBULATORY_CARE_PROVIDER_SITE_OTHER): Payer: PPO | Admitting: Internal Medicine

## 2015-12-10 ENCOUNTER — Telehealth: Payer: Self-pay

## 2015-12-10 ENCOUNTER — Ambulatory Visit
Admission: RE | Admit: 2015-12-10 | Discharge: 2015-12-10 | Disposition: A | Discharge status: Home / Self Care | Payer: PPO | Source: Ambulatory Visit | Attending: Internal Medicine | Admitting: Internal Medicine

## 2015-12-10 DIAGNOSIS — F5105 Insomnia due to other mental disorder: Secondary | ICD-10-CM

## 2015-12-10 DIAGNOSIS — M7122 Synovial cyst of popliteal space [Baker], left knee: Secondary | ICD-10-CM

## 2015-12-10 DIAGNOSIS — M7989 Other specified soft tissue disorders: Secondary | ICD-10-CM | POA: Diagnosis not present

## 2015-12-10 DIAGNOSIS — F409 Phobic anxiety disorder, unspecified: Secondary | ICD-10-CM | POA: Diagnosis not present

## 2015-12-10 DIAGNOSIS — Z7901 Long term (current) use of anticoagulants: Secondary | ICD-10-CM

## 2015-12-10 DIAGNOSIS — M79662 Pain in left lower leg: Secondary | ICD-10-CM | POA: Diagnosis not present

## 2015-12-10 MED ORDER — ALPRAZOLAM 0.25 MG PO TABS: 0.2500 mg | ORAL_TABLET | Freq: Every evening | ORAL | 3 refills | Status: DC | PRN

## 2015-12-10 NOTE — Telephone Encounter (Signed)
Referral to [facility]

## 2015-12-10 NOTE — Progress Notes (Signed)
Subjective:  Patient ID: [name], female    DOB: 1950-02-14  Age: 65 y.o. MRN: [id number]  CC: Diagnoses of Insomnia due to anxiety and fear, Pain and swelling of lower leg, left, Baker's cyst of knee, left, and Long term current use of anticoagulant therapy were pertinent to this visit.  HPI [name] presents for evaluation of recent development of  posterior left leg focal swelling below the popliteal space . First noticed Nov 28th .  No recent trauma, travel or  or bug bite. No unusual activity except did stand on a short step ladder for several minutes whiling handing christmas decorations.  First noticed it while shaving legs,  Has decreased in size  Since then.  Did not turn red or blue.  Front of l eg has been aching and throbbing even before the bulge was noted, for the past  6-7 months.   History of VTE,  Has been complaint with warfarin and last INR was  2.2 on nov 16th.  no missed doses since then  And she was careful over the holiday to avoid eating excessive greens.   FH of sarcoma (Mother had  SARCOMA which presented as a lump in her inner thigh)    Outpatient Medications Prior to Visit  Medication Sig Dispense Refill  . amLODipine (NORVASC) 5 MG tablet TAKE ONE TABLET BY MOUTH ONCE DAILY 30 tablet 5  . aspirin 81 MG tablet Take 81 mg by mouth daily.    . butalbital-acetaminophen-caffeine (FIORICET, ESGIC) 50-325-40 MG tablet TAKE ONE TABLET BY MOUTH EVERY 6 HOURS AS NEEDED FOR HEADACHE 90 tablet 0  . Cholecalciferol (VITAMIN D3) 1000 UNITS CAPS Take by mouth.    . furosemide (LASIX) 20 MG tablet Take 1 tablet (20 mg total) by mouth daily. 90 tablet 1  . imipramine (TOFRANIL) 25 MG tablet TAKE ONE TABLET BY MOUTH AT BEDTIME 30 tablet 5  . metoprolol succinate (TOPROL-XL) 25 MG 24 hr tablet TAKE ONE TABLET BY MOUTH ONCE DAILY 90 tablet 3  . omeprazole (PRILOSEC) 20 MG capsule Take 20 mg by mouth daily.    . simvastatin (ZOCOR) 40 MG tablet Take 1 tablet by mouth at   bedtime 90 tablet 0  . traMADol (ULTRAM) 50 MG tablet Take 1 tablet (50 mg total) by mouth every 8 (eight) hours as needed. 90 tablet 4  . warfarin (COUMADIN) 6 MG tablet Take 1 tablet (6 mg total) by mouth daily. 90 tablet 1  . zoster vaccine live, PF, (ZOSTAVAX) 13086 UNT/0.65ML injection Inject 19,400 Units into the skin once. 1 each 0  . ALPRAZolam (XANAX) 0.25 MG tablet Take 1 tablet (0.25 mg total) by mouth at bedtime as needed for anxiety. 30 tablet 3   No facility-administered medications prior to visit.     Review of Systems;  Patient denies headache, fevers, malaise, unintentional weight loss, skin rash, eye pain, sinus congestion and sinus pain, sore throat, dysphagia,  hemoptysis , cough, dyspnea, wheezing, chest pain, palpitations, orthopnea, edema, abdominal pain, nausea, melena, diarrhea, constipation, flank pain, dysuria, hematuria, urinary  Frequency, nocturia, numbness, tingling, seizures,  Focal weakness, Loss of consciousness,  Tremor, insomnia, depression, anxiety, and suicidal ideation.      Objective:  BP 122/68   Pulse 75   Temp 98.3 F (36.8 C) (Oral)   Resp 12   Ht 5' 1.5" (1.562 m)   Wt 143 lb (64.9 kg)   SpO2 97%   BMI 26.58 kg/m   BP Readings from  Last 3 Encounters:  12/10/15 122/68  11/22/15 (!) 164/78  09/03/15 122/82    Wt Readings from Last 3 Encounters:  12/10/15 143 lb (64.9 kg)  11/22/15 139 lb (63 kg)  09/03/15 145 lb 8 oz (66 kg)    General appearance: alert, cooperative and appears stated age Back: symmetric, no curvature. ROM normal. No CVA tenderness. Lungs: clear to auscultation bilaterally Heart: regular rate and rhythm, S1, S2 normal, no murmur, click, rub or gallop Pulses: 2+ and symmetric Ext: tender non fluctuant no red lump in the left posterior superior calf abutting the inferior border of the popliteal space,  With  1/2 inch differential  In left circumference (left calf larger) Skin: Skin color, texture, turgor normal. No  rashes or lesions Lymph nodes: Cervical, supraclavicular, and axillary nodes normal.  Lab Results  Component Value Date   HGBA1C 6.0 09/03/2015   HGBA1C 6.3 05/31/2015   HGBA1C 6.1 02/26/2015    Lab Results  Component Value Date   CREATININE 1.12 (H) 11/22/2015   CREATININE 0.84 09/03/2015   CREATININE 0.89 05/31/2015    Lab Results  Component Value Date   WBC 4.5 11/22/2015   HGB 15.5 11/22/2015   HCT 47.0 11/22/2015   PLT 163 11/22/2015   GLUCOSE 98 11/22/2015   CHOL 201 (H) 09/03/2015   TRIG 149.0 09/03/2015   HDL 52.60 09/03/2015   LDLDIRECT 117.0 09/03/2015   LDLCALC 118 (H) 09/03/2015   ALT 17 09/03/2015   AST 18 09/03/2015   NA 140 11/22/2015   K 3.8 11/22/2015   CL 102 11/22/2015   CREATININE 1.12 (H) 11/22/2015   BUN 12 11/22/2015   CO2 31 11/22/2015   TSH 1.27 12/05/2014   INR 2.2 (H) 12/10/2015   HGBA1C 6.0 09/03/2015   MICROALBUR <0.7 09/03/2015    No results found.  Assessment & Plan:   Problem List Items Addressed This Visit    Baker's cyst of knee, left    Confirmed with urgent vascular US done today. Cool compresses,  Follow up with Orthopedics . Referral to [facility] made.       Long term current use of anticoagulant therapy    Coumadin level is therapeutic,  Continue current regimen and repeat PT/INR in one month. Lab Results  Component Value Date   INR 2.2 (H) 12/10/2015   INR 2.20 11/22/2015   INR 2.6 (H) 11/15/2015   PROTIME 57.9 (A) 02/17/2014         Pain and swelling of lower leg, left   Relevant Orders   US Venous Img Lower Unilateral Left (Completed)   Basic metabolic panel   Protime-INR (Completed)    Other Visit Diagnoses    Insomnia due to anxiety and fear       Relevant Medications   ALPRAZolam (XANAX) 0.25 MG tablet      I am having Ms. Bouska maintain her omeprazole, Vitamin D3, aspirin, zoster vaccine live (PF), simvastatin, metoprolol succinate, butalbital-acetaminophen-caffeine, traMADol, furosemide,  amLODipine, warfarin, imipramine, and ALPRAZolam.  Meds ordered this encounter  Medications  . ALPRAZolam (XANAX) 0.25 MG tablet    Sig: Take 1 tablet (0.25 mg total) by mouth at bedtime as needed for anxiety.    Dispense:  30 tablet    Refill:  3    Medications Discontinued During This Encounter  Medication Reason  . ALPRAZolam (XANAX) 0.25 MG tablet Reorder    Follow-up: No Follow-up on file.   Crecencio Mc, MD

## 2015-12-10 NOTE — Patient Instructions (Signed)
If the ultrasound is negative for DVT or Baker's cyst,  I will need to order a CT if the swelling persists

## 2015-12-10 NOTE — Telephone Encounter (Signed)
[facility] Call Report  Ultrasound left sided negative for DVT, showed Left Sided Baker Cyst.  See full detail report in EPIC.    Results given to Dr Derrel Nip .  Per Dr Derrel Nip patient can apply warm / cool compresses .  Offer referral to ortho.     Gave patient results .  Patient verbalized understanding.  Patient states she has seen Dr Drue Dun at [facility] in the past would like to go ahead with referral to orthopaedics,

## 2015-12-10 NOTE — Progress Notes (Signed)
Pre-visit discussion using our clinic review tool. No additional management support is needed unless otherwise documented below in the visit note.  

## 2015-12-11 ENCOUNTER — Encounter: Payer: Self-pay | Admitting: Internal Medicine

## 2015-12-11 DIAGNOSIS — M7122 Synovial cyst of popliteal space [Baker], left knee: Secondary | ICD-10-CM | POA: Insufficient documentation

## 2015-12-11 LAB — BASIC METABOLIC PANEL
BUN: 19 mg/dL (ref 6–23)
CALCIUM: 9.4 mg/dL (ref 8.4–10.5)
CO2: 32 meq/L (ref 19–32)
CREATININE: 1.03 mg/dL (ref 0.40–1.20)
Chloride: 101 mEq/L (ref 96–112)
GFR: 56.86 mL/min — ABNORMAL LOW (ref >60.00)
Glucose, Bld: 100 mg/dL — ABNORMAL HIGH (ref 70–99)
Potassium: 4.2 mEq/L (ref 3.5–5.1)
Sodium: 141 mEq/L (ref 135–145)

## 2015-12-11 LAB — PROTIME-INR
INR: 2.2 — AB
PROTHROMBIN TIME: 22.4 s — AB (ref 9.0–11.5)

## 2015-12-11 NOTE — Assessment & Plan Note (Signed)
Coumadin level is therapeutic,  Continue current regimen and repeat PT/INR in one month. Lab Results  Component Value Date   INR 2.2 (H) 12/10/2015   INR 2.20 11/22/2015   INR 2.6 (H) 11/15/2015   PROTIME 57.9 (A) 02/17/2014

## 2015-12-11 NOTE — Assessment & Plan Note (Signed)
Confirmed with urgent vascular US done today. Cool compresses,  Follow up with Orthopedics . Referral to [facility] made.

## 2015-12-28 DIAGNOSIS — M222X2 Patellofemoral disorders, left knee: Secondary | ICD-10-CM | POA: Diagnosis not present

## 2016-01-02 ENCOUNTER — Other Ambulatory Visit: Payer: Self-pay | Admitting: Internal Medicine

## 2016-01-10 ENCOUNTER — Ambulatory Visit
Admission: RE | Admit: 2016-01-10 | Discharge: 2016-01-10 | Disposition: A | Discharge status: Home / Self Care | Payer: PPO | Source: Ambulatory Visit | Attending: Internal Medicine | Admitting: Internal Medicine

## 2016-01-10 DIAGNOSIS — Z1239 Encounter for other screening for malignant neoplasm of breast: Secondary | ICD-10-CM

## 2016-01-28 ENCOUNTER — Other Ambulatory Visit: Payer: Self-pay | Admitting: Internal Medicine

## 2016-01-29 ENCOUNTER — Other Ambulatory Visit (INDEPENDENT_AMBULATORY_CARE_PROVIDER_SITE_OTHER): Payer: PPO

## 2016-01-29 DIAGNOSIS — Z7901 Long term (current) use of anticoagulants: Secondary | ICD-10-CM

## 2016-01-29 LAB — PROTIME-INR
INR: 2.8 ratio — ABNORMAL HIGH (ref 0.8–1.0)
PROTHROMBIN TIME: 30.2 s — AB (ref 9.6–13.1)

## 2016-01-31 ENCOUNTER — Telehealth: Payer: Self-pay | Admitting: *Deleted

## 2016-01-31 DIAGNOSIS — Z7901 Long term (current) use of anticoagulants: Secondary | ICD-10-CM

## 2016-01-31 NOTE — Telephone Encounter (Signed)
Placed standing order for PT/INR 

## 2016-02-06 ENCOUNTER — Other Ambulatory Visit: Payer: Self-pay | Admitting: Internal Medicine

## 2016-02-06 ENCOUNTER — Ambulatory Visit
Admission: RE | Admit: 2016-02-06 | Discharge: 2016-02-06 | Disposition: A | Discharge status: Home / Self Care | Payer: PPO | Source: Ambulatory Visit | Attending: Internal Medicine | Admitting: Internal Medicine

## 2016-02-06 DIAGNOSIS — Z1239 Encounter for other screening for malignant neoplasm of breast: Secondary | ICD-10-CM

## 2016-02-06 DIAGNOSIS — Z1231 Encounter for screening mammogram for malignant neoplasm of breast: Secondary | ICD-10-CM | POA: Diagnosis not present

## 2016-02-06 LAB — HM MAMMOGRAPHY

## 2016-03-03 ENCOUNTER — Other Ambulatory Visit: Payer: Self-pay | Admitting: Internal Medicine

## 2016-03-03 ENCOUNTER — Other Ambulatory Visit: Payer: PPO

## 2016-03-04 ENCOUNTER — Other Ambulatory Visit (INDEPENDENT_AMBULATORY_CARE_PROVIDER_SITE_OTHER): Payer: PPO

## 2016-03-04 ENCOUNTER — Telehealth: Payer: Self-pay | Admitting: *Deleted

## 2016-03-04 DIAGNOSIS — Z7901 Long term (current) use of anticoagulants: Secondary | ICD-10-CM

## 2016-03-04 DIAGNOSIS — Z794 Long term (current) use of insulin: Secondary | ICD-10-CM | POA: Diagnosis not present

## 2016-03-04 DIAGNOSIS — E119 Type 2 diabetes mellitus without complications: Secondary | ICD-10-CM | POA: Diagnosis not present

## 2016-03-04 LAB — PROTIME-INR
INR: 2.6 ratio — AB (ref 0.8–1.0)
Prothrombin Time: 27.9 s — ABNORMAL HIGH (ref 9.6–13.1)

## 2016-03-04 LAB — HEMOGLOBIN A1C: HEMOGLOBIN A1C: 6.2 % (ref 4.6–6.5)

## 2016-03-04 NOTE — Telephone Encounter (Signed)
Thank  You for adding that .  BTW .  i'm going to try to make a standing order for the INR since [facility] taught me that today .

## 2016-03-04 NOTE — Addendum Note (Signed)
Addended by: Crecencio Mc on: 03/04/2016 04:26 PM   Modules accepted: Orders

## 2016-03-04 NOTE — Telephone Encounter (Signed)
Pt came in for PT/INR this afternoon & wanted to have her A1C checked also. Pt states it has been a while since it was checked. If okay, please place "futre" order for A1C. It was last checked on 09/02/16.

## 2016-03-04 NOTE — Addendum Note (Signed)
Addended by: Leeanne Rio on: 03/04/2016 03:39 PM   Modules accepted: Orders

## 2016-03-04 NOTE — Telephone Encounter (Signed)
I went ahead and added it since it has been 6 months since it was checked so it would go off today with the 3:30 pick up.

## 2016-03-05 ENCOUNTER — Encounter: Payer: Self-pay | Admitting: Internal Medicine

## 2016-03-10 DIAGNOSIS — D2261 Melanocytic nevi of right upper limb, including shoulder: Secondary | ICD-10-CM | POA: Diagnosis not present

## 2016-03-10 DIAGNOSIS — D485 Neoplasm of uncertain behavior of skin: Secondary | ICD-10-CM | POA: Diagnosis not present

## 2016-03-10 DIAGNOSIS — Z86018 Personal history of other benign neoplasm: Secondary | ICD-10-CM | POA: Diagnosis not present

## 2016-03-10 DIAGNOSIS — L918 Other hypertrophic disorders of the skin: Secondary | ICD-10-CM | POA: Diagnosis not present

## 2016-03-10 DIAGNOSIS — L821 Other seborrheic keratosis: Secondary | ICD-10-CM | POA: Diagnosis not present

## 2016-03-21 ENCOUNTER — Ambulatory Visit (INDEPENDENT_AMBULATORY_CARE_PROVIDER_SITE_OTHER): Payer: PPO

## 2016-03-21 VITALS — BP 116/64 | HR 77 | Temp 98.4°F | Resp 12 | Ht 61.5 in | Wt 148.8 lb

## 2016-03-21 DIAGNOSIS — Z0000 Encounter for general adult medical examination without abnormal findings: Secondary | ICD-10-CM

## 2016-03-21 DIAGNOSIS — E2839 Other primary ovarian failure: Secondary | ICD-10-CM

## 2016-03-21 NOTE — Patient Instructions (Addendum)
Ms. Kuk , Thank you for taking time to come for your Medicare Wellness Visit. I appreciate your ongoing commitment to your health goals. Please review the following plan we discussed and let me know if I can assist you in the future.   Follow up with Dr. Derrel Nip as needed.    Bring a copy of your [place] and/or Living Will to be scanned into chart.  Bone Density as directed.  Have a great day!  These are the goals we discussed: Goals    . Decrease soda or juice intake    . Increase physical activity          Try walking for 30 minutes at least 3 days a week.       This is a list of the screening recommended for you and due dates:  Health Maintenance  Topic Date Due  . Complete foot exam   04/01/1960  . DEXA scan (bone density measurement)  04/01/2014  . Eye exam for diabetics  08/09/2016  . Hemoglobin A1C  09/01/2016  . Urine Protein Check  09/02/2016  . Mammogram  02/05/2018  . Tetanus Vaccine  10/28/2022  . Colon Cancer Screening  12/05/2024  . Flu Shot  Completed  .  Hepatitis C: One time screening is recommended by Center for Disease Control  (CDC) for  adults born from [birth year] through 1966.   Completed  . Pneumonia vaccines  Completed    Bone Densitometry Bone densitometry is an imaging test that uses a special X-ray to measure the amount of calcium and other minerals in your bones (bone density). This test is also known as a bone mineral density test or dual-energy X-ray absorptiometry (DXA). The test can measure bone density at your hip and your spine. It is similar to having a regular X-ray. You may have this test to:  Diagnose a condition that causes weak or thin bones (osteoporosis).  Predict your risk of a broken bone (fracture).  Determine how well osteoporosis treatment is working. Tell a health care provider about:  Any allergies you have.  All medicines you are taking, including vitamins, herbs, eye drops, creams, and  over-the-counter medicines.  Any problems you or family members have had with anesthetic medicines.  Any blood disorders you have.  Any surgeries you have had.  Any medical conditions you have.  Possibility of pregnancy.  Any other medical test you had within the previous 14 days that used contrast material. What are the risks? Generally, this is a safe procedure. However, problems can occur and may include the following:  This test exposes you to a very small amount of radiation.  The risks of radiation exposure may be greater to unborn children. What happens before the procedure?  Do not take any calcium supplements for 24 hours before having the test. You can otherwise eat and drink what you usually do.  Take off all metal jewelry, eyeglasses, dental appliances, and any other metal objects. What happens during the procedure?  You may lie on an exam table. There will be an X-ray generator below you and an imaging device above you.  Other devices, such as boxes or braces, may be used to position your body properly for the scan.  You will need to lie still while the machine slowly scans your body.  The images will show up on a computer monitor. What happens after the procedure? You may need more testing at a later time. This information is not  intended to replace advice given to you by your health care provider. Make sure you discuss any questions you have with your health care provider. Document Released: 01/15/2004 Document Revised: 05/31/2015 Document Reviewed: 06/02/2013 Elsevier Interactive Patient Education  2017 [employer].

## 2016-03-21 NOTE — Progress Notes (Signed)
Subjective:   [name] is a 66 y.o. female who presents for an Initial Medicare Annual Wellness Visit.  Review of Systems    No ROS.  Medicare Wellness Visit.  Cardiac Risk Factors include: advanced age (>24men, >77 women);hypertension;diabetes mellitus     Objective:    Today's Vitals   03/21/16 1515  BP: 116/64  Pulse: 77  Resp: 12  Temp: 98.4 F (36.9 C)  TempSrc: Oral  SpO2: 97%  Weight: 148 lb 12.8 oz (67.5 kg)  Height: 5' 1.5" (1.562 m)   Body mass index is 27.66 kg/m.   Current Medications (verified) Outpatient Encounter Prescriptions as of 03/21/2016  Medication Sig  . ALPRAZolam (XANAX) 0.25 MG tablet Take 1 tablet (0.25 mg total) by mouth at bedtime as needed for anxiety.  Marland Kitchen amLODipine (NORVASC) 5 MG tablet TAKE ONE TABLET BY MOUTH ONCE DAILY  . aspirin 81 MG tablet Take 81 mg by mouth daily.  . butalbital-acetaminophen-caffeine (FIORICET, ESGIC) 50-325-40 MG tablet TAKE ONE TABLET BY MOUTH EVERY 6 HOURS AS NEEDED FOR HEADACHE  . Cholecalciferol (VITAMIN D3) 1000 UNITS CAPS Take by mouth.  . furosemide (LASIX) 20 MG tablet TAKE ONE TABLET BY MOUTH ONCE DAILY  . imipramine (TOFRANIL) 25 MG tablet TAKE ONE TABLET BY MOUTH AT BEDTIME  . metoprolol succinate (TOPROL-XL) 25 MG 24 hr tablet TAKE ONE TABLET BY MOUTH ONCE DAILY  . omeprazole (PRILOSEC) 20 MG capsule Take 20 mg by mouth daily.  . simvastatin (ZOCOR) 40 MG tablet Take 1 tablet by mouth at  bedtime  . traMADol (ULTRAM) 50 MG tablet Take 1 tablet (50 mg total) by mouth every 8 (eight) hours as needed.  . warfarin (COUMADIN) 6 MG tablet TAKE ONE TABLET BY MOUTH ONCE DAILY  . zoster vaccine live, PF, (ZOSTAVAX) 84696 UNT/0.65ML injection Inject 19,400 Units into the skin once.  . [DISCONTINUED] simvastatin (ZOCOR) 40 MG tablet TAKE ONE TABLET BY MOUTH AT BEDTIME   No facility-administered encounter medications on file as of 03/21/2016.     Allergies (verified) Patient has no known allergies.    History: Past Medical History:  Diagnosis Date  . Diabetes mellitus without complication ([place])   . H/O toxic shock syndrome 2001   post urologic procedure for stone removal  . 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 Hyperlipidemia   . Hypertension   . Ischemic colitis ([place]) 2012-2013   s/p left colectomy May 2013  . Kidney stones    Past Surgical History:  Procedure Laterality Date  . ABDOMINAL HYSTERECTOMY  1996  . APPENDECTOMY    . bilateral tubal ligation    . BREAST EXCISIONAL BIOPSY Left 1989   benign  . BREAST SURGERY  1989   left biopsy, normal  . COLECTOMY Left May 2013   [place], ischemic colitis  . COLONOSCOPY WITH PROPOFOL N/A 11/16/2014   Procedure: COLONOSCOPY WITH PROPOFOL;  Surgeon: Manya Silvas, MD;  Location[facility] ENDOSCOPY;  Service: Endoscopy;  Laterality: N/A;  . ESOPHAGOGASTRODUODENOSCOPY (EGD) WITH PROPOFOL N/A 11/16/2014   Procedure: ESOPHAGOGASTRODUODENOSCOPY (EGD) WITH PROPOFOL;  Surgeon: Manya Silvas, MD;  Location: [facility] ENDOSCOPY;  Service: Endoscopy;  Laterality: N/A;  . SPINE SURGERY  1984   lumbar diskectomy, [place]  . ureterolithotomy     Family History  Problem Relation Age of Onset  . Hypertension Mother   . Heart disease Father   . Hypertension Father   . Breast cancer Paternal  Aunt     <50  . Breast cancer Cousin    Social History   Occupational History  . Not on file.   Social History Main Topics  . Smoking status: Never Smoker  . Smokeless tobacco: Never Used  . Alcohol use No  . Drug use: No  . Sexual activity: Yes    Tobacco Counseling Counseling given: Not Answered   Activities of Daily Living In your present state of health, do you have any difficulty performing the following activities: 03/21/2016  Hearing? Y  Vision? N  Difficulty concentrating or making decisions? N  Walking or climbing stairs? Y  Dressing or bathing? N  Doing errands, shopping?  N  Preparing Food and eating ? N  Using the Toilet? N  In the past six months, have you accidently leaked urine? N  Do you have problems with loss of bowel control? N  Managing your Medications? N  Managing your Finances? N  Housekeeping or managing your Housekeeping? N  Some recent data might be hidden    Immunizations and Health Maintenance Immunization History  Administered Date(s) Administered  . Influenza, High Dose Seasonal PF 09/03/2015  . Influenza,inj,Quad PF,36+ Mos 10/27/2012, 11/02/2013  . Influenza-Unspecified 11/07/2014  . Pneumococcal Conjugate-13 05/19/2014  . Pneumococcal Polysaccharide-23 05/31/2015  . Tdap 10/27/2012   Health Maintenance Due  Topic Date Due  . FOOT EXAM  04[birth year]01/1960  . DEXA SCAN  04/01/2014    Patient Care Team: Crecencio Mc, MD as PCP - General (Internal Medicine) Manya Silvas, MD (Gastroenterology)  Indicate any recent Medical Services you may have received from other than [facility] providers in the past year (date may be approximate).     Assessment:   This is a routine wellness examination for [name]. The goal of the wellness visit is to assist the patient how to close the gaps in care and create a preventative care plan for the patient.   Taking calcium VIT D3 as appropriate/Osteoporosis risk reviewed.  Medications reviewed; taking without issues or barriers.  Safety issues reviewed; smoke detectors in the home. No firearms in the home. Wears seatbelts when driving or riding with others. Patient does wear sunscreen or protective clothing when in direct sunlight. No violence in the home.  Patient is alert, normal appearance, oriented to person/place/and time. Correctly identified the president of the [place], recall of 3/3 words, and performing simple calculations.  Patient displays appropriate judgement and can read correct time from watch face.  No new identified risk were noted.  No failures at ADL's or IADL's.   BMI-  discussed the importance of a healthy diet, water intake and exercise. Educational material provided.   Diet: Breakfast: Arnold sandwich with cheese Lunch: Peanut butter sandwich Dinner: Taco Salad Daily fluid intake: 2 cups of caffeine, 3 cups of water   HTN- followed by PCP.  Dental- every six months.  Dr. Mike Craze.  Sleep patterns- Sleeps 6-7 hours at night.  Wakes feeling rested.  Patient Concerns: None at this time. Follow up with PCP as needed.  Hearing/Vision screen Hearing Screening Comments: Followed by [facility] Visits every 6 months  Hearing aid, bilateral Vision Screening Comments: Followed by [facility] (Dr. Edison Pace) Wears corrective lenses Last OV 08/2015 Visual acuity not assessed per patient preference since they have regular follow up with the ophthalmologist  Dietary issues and exercise activities discussed: Current Exercise Habits: Home exercise routine, Type of exercise: walking, Time (Minutes): 25, Frequency (Times/Week): 7, Weekly Exercise (Minutes/Week): 175,  Intensity: Mild  Goals    . Decrease soda or juice intake    . Increase physical activity          Try walking for 30 minutes at least 3 days a week.      Depression Screen PHQ 2/9 Scores 03/21/2016 05/19/2014  PHQ - 2 Score 0 0    Fall Risk Fall Risk  03/21/2016 05/19/2014  Falls in the past year? No Yes  Number falls in past yr: - 1  Injury with Fall? - Yes  Risk for fall due to : - Other (Comment)  Risk for fall due to (comments): - Patient had injuried opposite knee and was walking upstairs when she put too much pressure on the leg and it gave out    Cognitive Function: MMSE - Mini Mental State Exam 03/21/2016  Orientation to time 5  Orientation to Place 5  Registration 3  Attention/ Calculation 5  Recall 3  Language- name 2 objects 2  Language- repeat 1  Language- follow 3 step command 3  Language- read & follow direction 1  Write a sentence 1  Copy design 1   Total score 30        Screening Tests Health Maintenance  Topic Date Due  . FOOT EXAM  04/01/1960  . DEXA SCAN  04/01/2014  . OPHTHALMOLOGY EXAM  08/09/2016  . HEMOGLOBIN A1C  09/01/2016  . URINE MICROALBUMIN  09/02/2016  . MAMMOGRAM  02/05/2018  . TETANUS/TDAP  10/28/2022  . COLONOSCOPY  12/05/2024  . INFLUENZA VACCINE  Completed  . Hepatitis C Screening  Completed  . PNA vac Low Risk Adult  Completed      Plan:    End of life planning; Advance aging; Advanced directives discussed. Copy of current HCPOA/Living Will requested.    Medicare Attestation I have personally reviewed: The patient's medical and social history Their use of alcohol, tobacco or illicit drugs Their current medications and supplements The patient's functional ability including ADLs,fall risks, home safety risks, cognitive, and hearing and visual impairment Diet and physical activities Evidence for depression   The patient's weight, height, BMI, and visual acuity have been recorded in the chart.  I have made referrals and provided education to the patient based on review of the above and I have provided the patient with a written personalized care plan for preventive services.    During the course of the visit, [name] was educated and counseled about the following appropriate screening and preventive services:   Vaccines to include Pneumoccal, Influenza, Hepatitis B, Td, Zostavax, HCV  Colorectal cancer screening-UTD  Bone density screening-due, ordered.  Follow as directed.  Diabetes-followed by PCP  Glaucoma screening-annual eye exam  Mammography-UTD  Nutrition counseling  Patient Instructions (the written plan) were given to the patient.    Varney Biles, LPN   01/14/3233

## 2016-03-23 MED ORDER — BUTALBITAL-APAP-CAFFEINE 50-325-40 MG PO TABS: ORAL_TABLET | ORAL | 0 refills | Status: DC

## 2016-03-23 NOTE — Progress Notes (Signed)
  I have reviewed the above information and agree with above.   Jeshurun O[place]aca, MD 

## 2016-04-05 ENCOUNTER — Other Ambulatory Visit: Payer: Self-pay | Admitting: Internal Medicine

## 2016-04-21 ENCOUNTER — Other Ambulatory Visit (INDEPENDENT_AMBULATORY_CARE_PROVIDER_SITE_OTHER): Payer: PPO

## 2016-04-21 DIAGNOSIS — Z7901 Long term (current) use of anticoagulants: Secondary | ICD-10-CM

## 2016-04-21 LAB — PROTIME-INR
INR: 2.6 ratio — AB (ref 0.8–1.0)
PROTHROMBIN TIME: 28.4 s — AB (ref 9.6–13.1)

## 2016-04-22 ENCOUNTER — Encounter: Payer: Self-pay | Admitting: Internal Medicine

## 2016-05-13 ENCOUNTER — Ambulatory Visit
Admission: RE | Admit: 2016-05-13 | Discharge: 2016-05-13 | Disposition: A | Discharge status: Home / Self Care | Payer: PPO | Source: Ambulatory Visit | Attending: Internal Medicine | Admitting: Internal Medicine

## 2016-05-13 DIAGNOSIS — Z780 Asymptomatic menopausal state: Secondary | ICD-10-CM | POA: Diagnosis not present

## 2016-05-13 DIAGNOSIS — M818 Other osteoporosis without current pathological fracture: Secondary | ICD-10-CM | POA: Diagnosis not present

## 2016-05-13 DIAGNOSIS — E2839 Other primary ovarian failure: Secondary | ICD-10-CM | POA: Diagnosis not present

## 2016-05-13 DIAGNOSIS — Z13820 Encounter for screening for osteoporosis: Secondary | ICD-10-CM | POA: Insufficient documentation

## 2016-05-15 ENCOUNTER — Encounter: Payer: Self-pay | Admitting: Internal Medicine

## 2016-05-15 ENCOUNTER — Telehealth: Payer: Self-pay | Admitting: Internal Medicine

## 2016-05-15 DIAGNOSIS — M810 Age-related osteoporosis without current pathological fracture: Secondary | ICD-10-CM | POA: Insufficient documentation

## 2016-05-15 NOTE — Telephone Encounter (Signed)
MyChart message sent  Re DEXA results

## 2016-05-21 DIAGNOSIS — H01003 Unspecified blepharitis right eye, unspecified eyelid: Secondary | ICD-10-CM | POA: Diagnosis not present

## 2016-05-23 ENCOUNTER — Ambulatory Visit (INDEPENDENT_AMBULATORY_CARE_PROVIDER_SITE_OTHER): Payer: PPO | Admitting: Internal Medicine

## 2016-05-23 ENCOUNTER — Encounter: Payer: Self-pay | Admitting: Internal Medicine

## 2016-05-23 VITALS — BP 116/76 | HR 65 | Temp 98.4°F | Resp 15 | Ht 61.5 in | Wt 149.4 lb

## 2016-05-23 DIAGNOSIS — Z5181 Encounter for therapeutic drug level monitoring: Secondary | ICD-10-CM | POA: Diagnosis not present

## 2016-05-23 DIAGNOSIS — E119 Type 2 diabetes mellitus without complications: Secondary | ICD-10-CM

## 2016-05-23 DIAGNOSIS — Z7901 Long term (current) use of anticoagulants: Secondary | ICD-10-CM | POA: Diagnosis not present

## 2016-05-23 DIAGNOSIS — M810 Age-related osteoporosis without current pathological fracture: Secondary | ICD-10-CM | POA: Diagnosis not present

## 2016-05-23 DIAGNOSIS — I10 Essential (primary) hypertension: Secondary | ICD-10-CM

## 2016-05-23 LAB — PROTIME-INR
INR: 2.5 ratio — AB (ref 0.8–1.0)
Prothrombin Time: 26.9 s — ABNORMAL HIGH (ref 9.6–13.1)

## 2016-05-23 MED ORDER — AMLODIPINE BESYLATE 2.5 MG PO TABS: 2.5000 mg | ORAL_TABLET | Freq: Every day | ORAL | 1 refills | Status: DC

## 2016-05-23 NOTE — Patient Instructions (Signed)
We discussed the options for treating osteoporosis:  Alendronate, or risedronate, or ibandronate   Raloxifene (NOT AN OPTION FOR YOU BECAUSE OF DVT/PE)  Reclast  Forteo  Prolia   In the meantime ,  Goal is 1800 mg calcium and 1000 IUs of Vitamin D3.  Along with weight bearing exercise  Return for labs in late August (fasting)     There are plenty of high protein low carb cookies,  But they're not called "cookies."  Look for them in the diet section  where the protein shakes are  Sold.   All of these have 5 g sugar or less : Power crunch Atkins bars KIND :thE  "low glycemic index"  variety QUEST : (taste better after being microwaved OUT OF THE WRAPPER)    Osteoporosis Osteoporosis is the thinning and loss of density in the bones. Osteoporosis makes the bones more brittle, fragile, and likely to break (fracture). Over time, osteoporosis can cause the bones to become so weak that they fracture after a simple fall. The bones most likely to fracture are the bones in the hip, wrist, and spine. What are the causes? The exact cause is not known. What increases the risk? Anyone can develop osteoporosis. You may be at greater risk if you have a family history of the condition or have poor nutrition. You may also have a higher risk if you are:  Female.  38 years old or older.  A smoker.  Not physically active.  White or Asian.  Slender. What are the signs or symptoms? A fracture might be the first sign of the disease, especially if it results from a fall or injury that would not usually cause a bone to break. Other signs and symptoms include:  Low back and neck pain.  Stooped posture.  Height loss. How is this diagnosed? To make a diagnosis, your health care provider may:  Take a medical history.  Perform a physical exam.  Order tests, such as:  A bone mineral density test.  A dual-energy X-ray absorptiometry test. How is this treated? The goal of osteoporosis  treatment is to strengthen your bones to reduce your risk of a fracture. Treatment may involve:  Making lifestyle changes, such as:  Eating a diet rich in calcium.  Doing weight-bearing and muscle-strengthening exercises.  Stopping tobacco use.  Limiting alcohol intake.  Taking medicine to slow the process of bone loss or to increase bone density.  Monitoring your levels of calcium and vitamin D. Follow these instructions at home:  Include calcium and vitamin D in your diet. Calcium is important for bone health, and vitamin D helps the body absorb calcium.  Perform weight-bearing and muscle-strengthening exercises as directed by your health care provider.  Do not use any tobacco products, including cigarettes, chewing tobacco, and electronic cigarettes. If you need help quitting, ask your health care provider.  Limit your alcohol intake.  Take medicines only as directed by your health care provider.  Keep all follow-up visits as directed by your health care provider. This is important.  Take precautions at home to lower your risk of falling, such as:  Keeping rooms well lit and clutter free.  Installing safety rails on stairs.  Using rubber mats in the bathroom and other areas that are often wet or slippery. Get help right away if: You fall or injure yourself. This information is not intended to replace advice given to you by your health care provider. Make sure you discuss any questions you have  have with your health care provider. Document Released: 10/02/2004 Document Revised: 05/28/2015 Document Reviewed: 06/02/2013 Elsevier Interactive Patient Education  2017 [employer]  

## 2016-05-23 NOTE — Progress Notes (Signed)
Subjective:  Patient ID: [name], female    DOB: 24-Apr-1950  Age: 66 y.o. MRN: [id number]  CC: The primary encounter diagnosis was Anticoagulation goal of INR 2 to 3. Diagnoses of Diabetes mellitus type 2, diet-controlled ([place]), Age-related osteoporosis without current pathological fracture, and Essential hypertension were also pertinent to this visit.  HPI AICHA CLINGENPEEL presents for follow up on type 2 DM , hypertension ,  encounter for anticoagulation and to discuss treatment for newly diagnoseid osteoporosi s.    Patient has no complaints today.  Patient is following a low glycemic index diet and taking all prescribed medications regularly without side effects.  Fasting sugars have been under less than 140 most of the time and post prandials have been under 160 except on rare occasions. Patient is exercising about 3 times per week and intentionally trying to lose weight .  Patient has had an eye exam in the last 12 months and checks feet regularly for signs of infection.  Patient does not walk barefoot outside,  And denies an numbness tingling or burning in feet. Patient is up to date on all recommended vaccinations  Lab Results  Component Value Date   HGBA1C 6.2 03/04/2016     Outpatient Medications Prior to Visit  Medication Sig Dispense Refill  . ALPRAZolam (XANAX) 0.25 MG tablet Take 1 tablet (0.25 mg total) by mouth at bedtime as needed for anxiety. 30 tablet 3  . aspirin 81 MG tablet Take 81 mg by mouth daily.    . butalbital-acetaminophen-caffeine (FIORICET, ESGIC) 50-325-40 MG tablet TAKE ONE TABLET BY MOUTH EVERY 6 HOURS AS NEEDED FOR HEADACHE 90 tablet 0  . Cholecalciferol (VITAMIN D3) 1000 UNITS CAPS Take by mouth.    . furosemide (LASIX) 20 MG tablet TAKE ONE TABLET BY MOUTH ONCE DAILY 90 tablet 1  . imipramine (TOFRANIL) 25 MG tablet TAKE ONE TABLET BY MOUTH AT BEDTIME 30 tablet 5  . metoprolol succinate (TOPROL-XL) 25 MG 24 hr tablet TAKE ONE TABLET BY MOUTH ONCE DAILY  90 tablet 3  . omeprazole (PRILOSEC) 20 MG capsule Take 20 mg by mouth daily.    . simvastatin (ZOCOR) 40 MG tablet Take 1 tablet by mouth at  bedtime 90 tablet 0  . traMADol (ULTRAM) 50 MG tablet Take 1 tablet (50 mg total) by mouth every 8 (eight) hours as needed. 90 tablet 4  . warfarin (COUMADIN) 6 MG tablet TAKE ONE TABLET BY MOUTH ONCE DAILY 90 tablet 1  . amLODipine (NORVASC) 5 MG tablet TAKE ONE TABLET BY MOUTH ONCE DAILY 30 tablet 5  . zoster vaccine live, PF, (ZOSTAVAX) 53748 UNT/0.65ML injection Inject 19,400 Units into the skin once. (Patient not taking: Reported on 05/23/2016) 1 each 0   No facility-administered medications prior to visit.     Review of Systems;  Patient denies headache, fevers, malaise, unintentional weight loss, skin rash, eye pain, sinus congestion and sinus pain, sore throat, dysphagia,  hemoptysis , cough, dyspnea, wheezing, chest pain, palpitations, orthopnea, edema, abdominal pain, nausea, melena, diarrhea, constipation, flank pain, dysuria, hematuria, urinary  Frequency, nocturia, numbness, tingling, seizures,  Focal weaknes s, Loss of consciousness,  Tremor, insomnia, depression, anxiety, and suicidal ideation.      Objective:  BP 116/76 (BP Location: Left Arm, Patient Position: Sitting, Cuff Size: Normal)   Pulse 65   Temp 98.4 F (36.9 C) (Oral)   Resp 15   Ht 5' 1.5" (1.562 m)   Wt 149 lb 6.4 oz (67.8 kg)  SpO2 97%   BMI 27.77 kg/m   BP Readings from Last 3 Encounters:  05/23/16 116/76  03/21/16 116/64  12/10/15 122/68    Wt Readings from Last 3 Encounters:  05/23/16 149 lb 6.4 oz (67.8 kg)  03/21/16 148 lb 12.8 oz (67.5 kg)  12/10/15 143 lb (64.9 kg)    General appearance: alert, cooperative and appears stated age Ears: normal TM's and external ear canals both ears Throat: lips, mucosa, and tongue normal; teeth and gums normal Neck: no adenopathy, no carotid bruit, supple, symmetrical, trachea midline and thyroid not enlarged,  symmetric, no tenderness/mass/nodules Back: symmetric, no curvature. ROM normal. No CVA tenderness. Lungs: clear to auscultation bilaterally Heart: regular rate and rhythm, S1, S2 normal, no murmur, click, rub or gallop Abdomen: soft, non-tender; bowel sounds normal; no masses,  no organomegaly Pulses: 2+ and symmetric Skin: Skin color, texture, turgor normal. No rashes or lesions Lymph nodes: Cervical, supraclavicular, and axillary nodes normal.  Lab Results  Component Value Date   HGBA1C 6.2 03/04/2016   HGBA1C 6.0 09/03/2015   HGBA1C 6.3 05/31/2015    Lab Results  Component Value Date   CREATININE 1.03 12/10/2015   CREATININE 1.12 (H) 11/22/2015   CREATININE 0.84 09/03/2015    Lab Results  Component Value Date   WBC 4.5 11/22/2015   HGB 15.5 11/22/2015   HCT 47.0 11/22/2015   PLT 163 11/22/2015   GLUCOSE 100 (H) 12/10/2015   CHOL 201 (H) 09/03/2015   TRIG 149.0 09/03/2015   HDL 52.60 09/03/2015   LDLDIRECT 117.0 09/03/2015   LDLCALC 118 (H) 09/03/2015   ALT 17 09/03/2015   AST 18 09/03/2015   NA 141 12/10/2015   K 4.2 12/10/2015   CL 101 12/10/2015   CREATININE 1.03 12/10/2015   BUN 19 12/10/2015   CO2 32 12/10/2015   TSH 1.27 12/05/2014   INR 2.5 (H) 05/23/2016   HGBA1C 6.2 03/04/2016   MICROALBUR <0.7 09/03/2015    Dexascan  Result Date: 05/13/2016 EXAM: DUAL X-RAY ABSORPTIOMETRY (DXA) FOR BONE MINERAL DENSITY IMPRESSION: Dear Dr. Crecencio Mc, Your patient [name] completed a BMD test on 05/13/2016 using the [place] (analysis version: 14.10) manufactured by [employer]. The following summarizes the results of our evaluation. PATIENT BIOGRAPHICAL: Name: Giles, Holian Patient ID:  [id number] Birth Date: 11-Dec-1950 Height:     61.5 in. Weight:     148.8 lbs. Gender:      Female Exam Date:  05/13/2016 Indications: Caucasian, History of Spinal Surgery, Hysterectomy, Postmenopausal, Low calcium intake Fractures: Treatments: ASSESSMENT: The BMD  measured at Femur Neck Left is 0.688 g/cm2 with a T-score of -2.5. This patient is considered OSTEOPOROTIC according to [facility]) criteria. Site Region Measured Measured WHO Young Adult BMD Date       Age      Classification T-score AP Spine L1-L4 05/[age] Osteoporosis -2.5 0.882 g/cm2 DualFemur Neck Left 05/13/2016 66.1 Osteoporosis -2.5 0.688 g/cm2 World Health Organization [facility]) criteria for post-menopausal, Caucasian Women: Normal:       T-score at or above -1 SD Osteopenia:   T-score between -1 and -2.5 SD Osteoporosis: T-score at or below -2.5 SD RECOMMENDATIONS: [place] recommends that FDA-approved medical therapies be considered in postmenopausal women and men age 31 or older with a: 1. Hip or vertebral (clinical or morphometric) fracture. 2. T-score of < -2.5 at the spine or hip. 3. Ten-year fracture probability by FRAX of 3% or greater for hip fracture  or 20% or greater for major osteoporotic fracture. All treatment decisions require clinical judgment and consideration of individual patient factors, including patient preferences, co-morbidities, previous drug use, risk factors not captured in the FRAX model (e.g. falls, vitamin D deficiency, increased bone turnover, interval significant decline in bone density) and possible under - or over-estimation of fracture risk by FRAX. All patients should ensure an adequate intake of dietary calcium (1200 mg/d) and vitamin D (800 IU daily) unless contraindicated. FOLLOW-UP: People with diagnosed cases of osteoporosis or at high risk for fracture should have regular bone mineral density tests. For patients eligible for Medicare, routine testing is allowed once every 2 years. The testing frequency can be increased to one year for patients who have rapidly progressing disease, those who are receiving or discontinuing medical therapy to restore bone mass, or have additional risk factors. I have reviewed this report,  and agree with the above findings. Mark A. Thornton Papas, M.D. [facility] Radiology Electronically Signed   By: Lavonia Dana M.D.   On: 05/13/2016 09:52    Assessment & Plan:   Problem List Items Addressed This Visit    Osteoporosis    Discussed all available treatment modalities.  Patient to consider options . 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 Renal function stable, no changes today.  Lab Results  Component Value Date   CREATININE 1.03 12/10/2015   Lab Results  Component Value Date   NA 141 12/10/2015   K 4.2 12/10/2015   CL 101 12/10/2015   CO2 32 12/10/2015  '      Relevant Medications   amLODipine (NORVASC) 2.5 MG tablet   Diabetes mellitus type 2, diet-controlled ([place])    She has lost weight and is following a  low glycemic index diet and her diabetes is diet controlled.   A1c is now 6.2.   Taking an aspirin and simvastatin daily   Lab Results  Component Value Date   HGBA1C 6.2 03/04/2016   Lab Results  Component Value Date   MICROALBUR <0.7 09/03/2015            Other Visit Diagnoses    Anticoagulation goal of INR 2 to 3    -  Primary   Relevant Orders   INR/PT (Completed)      I have changed Ms. Capelle's amLODipine. I am also having her maintain her omeprazole, Vitamin D3, aspirin, zoster vaccine live (PF), simvastatin, metoprolol succinate, traMADol, ALPRAZolam, furosemide, warfarin, butalbital-acetaminophen-caffeine, and imipramine.  Meds ordered this encounter  Medications  . amLODipine (NORVASC) 2.5 MG tablet    Sig: Take 1 tablet (2.5 mg total) by mouth daily.    Dispense:  90 tablet    Refill:  1    Please consider 90 day supplies to promote better adherence    Medications Discontinued During This Encounter  Medication Reason  . amLODipine (NORVASC) 5 MG tablet Reorder    Follow-up: No Follow-up on file.   Crecencio Mc, MD

## 2016-05-25 ENCOUNTER — Encounter: Payer: Self-pay | Admitting: Internal Medicine

## 2016-05-25 NOTE — Assessment & Plan Note (Signed)
Discussed all available treatment modalities.  Patient to consider options . 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

## 2016-05-25 NOTE — Assessment & Plan Note (Signed)
She has lost weight and is following a  low glycemic index diet and her diabetes is diet controlled.   A1c is now 6.2.   Taking an aspirin and simvastatin daily   Lab Results  Component Value Date   HGBA1C 6.2 03/04/2016   Lab Results  Component Value Date   MICROALBUR <0.7 09/03/2015

## 2016-05-25 NOTE — Assessment & Plan Note (Signed)
Well controlled on current regimen. Renal function stable, no changes today.  Lab Results  Component Value Date   CREATININE 1.03 12/10/2015   Lab Results  Component Value Date   NA 141 12/10/2015   K 4.2 12/10/2015   CL 101 12/10/2015   CO2 32 12/10/2015  '

## 2016-06-10 DIAGNOSIS — H02051 Trichiasis without entropian right upper eyelid: Secondary | ICD-10-CM | POA: Diagnosis not present

## 2016-06-10 LAB — HM DIABETES EYE EXAM

## 2016-06-30 ENCOUNTER — Other Ambulatory Visit: Payer: Self-pay | Admitting: Internal Medicine

## 2016-06-30 ENCOUNTER — Other Ambulatory Visit (INDEPENDENT_AMBULATORY_CARE_PROVIDER_SITE_OTHER): Payer: PPO

## 2016-06-30 DIAGNOSIS — Z7901 Long term (current) use of anticoagulants: Secondary | ICD-10-CM | POA: Diagnosis not present

## 2016-06-30 LAB — PROTIME-INR
INR: 2.5 ratio — AB (ref 0.8–1.0)
Prothrombin Time: 26.9 s — ABNORMAL HIGH (ref 9.6–13.1)

## 2016-07-02 ENCOUNTER — Encounter: Payer: Self-pay | Admitting: Internal Medicine

## 2016-08-05 DIAGNOSIS — H04123 Dry eye syndrome of bilateral lacrimal glands: Secondary | ICD-10-CM | POA: Diagnosis not present

## 2016-08-07 ENCOUNTER — Other Ambulatory Visit: Payer: Self-pay | Admitting: Internal Medicine

## 2016-08-11 ENCOUNTER — Other Ambulatory Visit: Payer: PPO

## 2016-08-11 ENCOUNTER — Telehealth: Payer: Self-pay | Admitting: Internal Medicine

## 2016-08-11 DIAGNOSIS — E559 Vitamin D deficiency, unspecified: Secondary | ICD-10-CM

## 2016-08-11 DIAGNOSIS — R5383 Other fatigue: Secondary | ICD-10-CM

## 2016-08-11 DIAGNOSIS — E785 Hyperlipidemia, unspecified: Secondary | ICD-10-CM

## 2016-08-11 DIAGNOSIS — E119 Type 2 diabetes mellitus without complications: Secondary | ICD-10-CM

## 2016-08-11 NOTE — Telephone Encounter (Signed)
Ordered lipid, A1C, CMP, LDL, TSH, and vitamin d. Is there anything else that you would like to have drawn?   Lab appt has been scheduled for 08/13/2016

## 2016-08-11 NOTE — Telephone Encounter (Signed)
PT called and stated that she needed to come back in August to have fasting labs. Please advise, thank you!  Call pt @ [phone]

## 2016-08-11 NOTE — Telephone Encounter (Signed)
No that covers it.  [place]

## 2016-08-13 ENCOUNTER — Telehealth: Payer: Self-pay | Admitting: Radiology

## 2016-08-13 ENCOUNTER — Other Ambulatory Visit (INDEPENDENT_AMBULATORY_CARE_PROVIDER_SITE_OTHER): Payer: PPO

## 2016-08-13 DIAGNOSIS — E559 Vitamin D deficiency, unspecified: Secondary | ICD-10-CM | POA: Diagnosis not present

## 2016-08-13 DIAGNOSIS — R350 Frequency of micturition: Secondary | ICD-10-CM

## 2016-08-13 DIAGNOSIS — Z7901 Long term (current) use of anticoagulants: Secondary | ICD-10-CM | POA: Diagnosis not present

## 2016-08-13 DIAGNOSIS — R5383 Other fatigue: Secondary | ICD-10-CM

## 2016-08-13 DIAGNOSIS — E785 Hyperlipidemia, unspecified: Secondary | ICD-10-CM

## 2016-08-13 DIAGNOSIS — E119 Type 2 diabetes mellitus without complications: Secondary | ICD-10-CM | POA: Diagnosis not present

## 2016-08-13 LAB — POCT URINALYSIS DIPSTICK
GLUCOSE UA: NEGATIVE
Nitrite, UA: NEGATIVE
Protein, UA: 30
SPEC GRAV UA: 1.015 (ref 1.010–1.025)
Urobilinogen, UA: 0.2 E.U./dL
pH, UA: 6 (ref 5.0–8.0)

## 2016-08-13 LAB — URINALYSIS, MICROSCOPIC ONLY

## 2016-08-13 LAB — COMPREHENSIVE METABOLIC PANEL
ALBUMIN: 4.4 g/dL (ref 3.5–5.2)
ALK PHOS: 86 U/L (ref 39–117)
ALT: 17 U/L (ref 0–35)
AST: 19 U/L (ref 0–37)
BILIRUBIN TOTAL: 0.5 mg/dL (ref 0.2–1.2)
BUN: 15 mg/dL (ref 6–23)
CALCIUM: 9.5 mg/dL (ref 8.4–10.5)
CO2: 28 mEq/L (ref 19–32)
Chloride: 105 mEq/L (ref 96–112)
Creatinine, Ser: 0.88 mg/dL (ref 0.40–1.20)
GFR: 68.05 mL/min (ref >60.00)
GLUCOSE: 108 mg/dL — AB (ref 70–99)
POTASSIUM: 3.8 meq/L (ref 3.5–5.1)
Sodium: 141 mEq/L (ref 135–145)
TOTAL PROTEIN: 6.9 g/dL (ref 6.0–8.3)

## 2016-08-13 LAB — LIPID PANEL
CHOLESTEROL: 198 mg/dL (ref 0–200)
HDL: 52.7 mg/dL (ref >39.00)
LDL CALC: 108 mg/dL — AB (ref 0–99)
NonHDL: 145.2
Total CHOL/HDL Ratio: 4
Triglycerides: 186 mg/dL — ABNORMAL HIGH (ref 0.0–149.0)
VLDL: 37.2 mg/dL (ref 0.0–40.0)

## 2016-08-13 LAB — VITAMIN D 25 HYDROXY (VIT D DEFICIENCY, FRACTURES): VITD: 44.35 ng/mL (ref 30.00–100.00)

## 2016-08-13 LAB — LDL CHOLESTEROL, DIRECT: LDL DIRECT: 111 mg/dL

## 2016-08-13 LAB — PROTIME-INR
INR: 2.7 ratio — AB (ref 0.8–1.0)
Prothrombin Time: 28.4 s — ABNORMAL HIGH (ref 9.6–13.1)

## 2016-08-13 LAB — TSH: TSH: 4.58 u[IU]/mL — ABNORMAL HIGH (ref 0.35–4.50)

## 2016-08-13 LAB — HEMOGLOBIN A1C: HEMOGLOBIN A1C: 6.3 % (ref 4.6–6.5)

## 2016-08-13 NOTE — Addendum Note (Signed)
Addended by: Arby Barrette on: 08/13/2016 02:21 PM   Modules accepted: Orders

## 2016-08-13 NOTE — Telephone Encounter (Signed)
Patient left Urine specimen, please advise, okay to order for UA, Culture and micro. thanks

## 2016-08-13 NOTE — Telephone Encounter (Signed)
Pt came in today for labs and stated she has been having urinary frequency onset for a day and lower back pain accompanied with  urine frequency. PT gave urine sample. Explained to pt I would send note back to provider and provider would decided if any testing is needed.

## 2016-08-13 NOTE — Telephone Encounter (Signed)
Yes you can always order for me  And just let me know you have done it.  I ordered itto save time

## 2016-08-13 NOTE — Addendum Note (Signed)
Addended by: Arby Barrette on: 08/13/2016 10:51 AM   Modules accepted: Orders

## 2016-08-13 NOTE — Telephone Encounter (Signed)
[place] see below, thanks

## 2016-08-13 NOTE — Addendum Note (Signed)
Addended by: Crecencio Mc on: 08/13/2016 12:45 PM   Modules accepted: Orders

## 2016-08-14 LAB — URINE CULTURE

## 2016-08-16 ENCOUNTER — Encounter: Payer: Self-pay | Admitting: Internal Medicine

## 2016-09-05 ENCOUNTER — Other Ambulatory Visit: Payer: Self-pay | Admitting: Internal Medicine

## 2016-09-25 ENCOUNTER — Other Ambulatory Visit (INDEPENDENT_AMBULATORY_CARE_PROVIDER_SITE_OTHER): Payer: PPO

## 2016-09-25 DIAGNOSIS — Z7901 Long term (current) use of anticoagulants: Secondary | ICD-10-CM

## 2016-09-25 LAB — PROTIME-INR
INR: 3.1 ratio — ABNORMAL HIGH (ref 0.8–1.0)
Prothrombin Time: 33.1 s — ABNORMAL HIGH (ref 9.6–13.1)

## 2016-10-01 NOTE — Telephone Encounter (Signed)
Orders

## 2016-10-08 DIAGNOSIS — M3501 Sicca syndrome with keratoconjunctivitis: Secondary | ICD-10-CM | POA: Diagnosis not present

## 2016-10-13 ENCOUNTER — Other Ambulatory Visit: Payer: Self-pay | Admitting: Internal Medicine

## 2016-10-23 DIAGNOSIS — H01003 Unspecified blepharitis right eye, unspecified eyelid: Secondary | ICD-10-CM | POA: Diagnosis not present

## 2016-10-28 ENCOUNTER — Other Ambulatory Visit (INDEPENDENT_AMBULATORY_CARE_PROVIDER_SITE_OTHER): Payer: PPO

## 2016-10-28 DIAGNOSIS — Z7901 Long term (current) use of anticoagulants: Secondary | ICD-10-CM

## 2016-10-28 LAB — PROTIME-INR
INR: 3.7 ratio — ABNORMAL HIGH (ref 0.8–1.0)
Prothrombin Time: 39.5 s — ABNORMAL HIGH (ref 9.6–13.1)

## 2016-10-29 ENCOUNTER — Encounter: Payer: Self-pay | Admitting: Internal Medicine

## 2016-10-31 ENCOUNTER — Other Ambulatory Visit: Payer: Self-pay | Admitting: Internal Medicine

## 2016-10-31 MED ORDER — WARFARIN SODIUM 5 MG PO TABS: 5.0000 mg | ORAL_TABLET | Freq: Every day | ORAL | 3 refills | Status: DC

## 2016-11-01 ENCOUNTER — Other Ambulatory Visit: Payer: Self-pay | Admitting: Internal Medicine

## 2016-11-01 MED ORDER — WARFARIN SODIUM 5 MG PO TABS: 5.0000 mg | ORAL_TABLET | Freq: Every day | ORAL | 3 refills | Status: DC

## 2016-11-07 DIAGNOSIS — H01003 Unspecified blepharitis right eye, unspecified eyelid: Secondary | ICD-10-CM | POA: Diagnosis not present

## 2016-11-18 ENCOUNTER — Encounter: Payer: Self-pay | Admitting: Internal Medicine

## 2016-11-18 ENCOUNTER — Other Ambulatory Visit (INDEPENDENT_AMBULATORY_CARE_PROVIDER_SITE_OTHER): Payer: PPO

## 2016-11-18 DIAGNOSIS — Z7901 Long term (current) use of anticoagulants: Secondary | ICD-10-CM

## 2016-11-18 DIAGNOSIS — R5383 Other fatigue: Secondary | ICD-10-CM | POA: Diagnosis not present

## 2016-11-18 DIAGNOSIS — R350 Frequency of micturition: Secondary | ICD-10-CM

## 2016-11-18 LAB — PROTIME-INR
INR: 2.3 ratio — ABNORMAL HIGH (ref 0.8–1.0)
PROTHROMBIN TIME: 24.3 s — AB (ref 9.6–13.1)

## 2016-11-18 LAB — TSH: TSH: 2.62 u[IU]/mL (ref 0.35–4.50)

## 2016-11-18 NOTE — Addendum Note (Signed)
Addended by: Leeanne Rio on: 11/18/2016 11:32 AM   Modules accepted: Orders

## 2016-11-18 NOTE — Addendum Note (Signed)
Addended by: Leeanne Rio on: 11/18/2016 11:38 AM   Modules accepted: Orders

## 2016-11-19 ENCOUNTER — Other Ambulatory Visit: Payer: PPO

## 2016-11-21 ENCOUNTER — Ambulatory Visit: Payer: PPO | Admitting: Internal Medicine

## 2016-11-21 ENCOUNTER — Encounter: Payer: Self-pay | Admitting: Internal Medicine

## 2016-11-21 VITALS — BP 132/58 | HR 69 | Temp 98.9°F | Resp 15 | Ht 61.5 in | Wt 159.6 lb

## 2016-11-21 DIAGNOSIS — R5383 Other fatigue: Secondary | ICD-10-CM | POA: Diagnosis not present

## 2016-11-21 DIAGNOSIS — F409 Phobic anxiety disorder, unspecified: Secondary | ICD-10-CM

## 2016-11-21 DIAGNOSIS — E119 Type 2 diabetes mellitus without complications: Secondary | ICD-10-CM | POA: Diagnosis not present

## 2016-11-21 DIAGNOSIS — Z7901 Long term (current) use of anticoagulants: Secondary | ICD-10-CM

## 2016-11-21 DIAGNOSIS — Z23 Encounter for immunization: Secondary | ICD-10-CM | POA: Diagnosis not present

## 2016-11-21 DIAGNOSIS — E785 Hyperlipidemia, unspecified: Secondary | ICD-10-CM | POA: Diagnosis not present

## 2016-11-21 DIAGNOSIS — F5105 Insomnia due to other mental disorder: Secondary | ICD-10-CM

## 2016-11-21 MED ORDER — ALPRAZOLAM 0.25 MG PO TABS: 0.2500 mg | ORAL_TABLET | Freq: Every evening | ORAL | 3 refills | Status: DC | PRN

## 2016-11-21 MED ORDER — BUTALBITAL-APAP-CAFFEINE 50-325-40 MG PO TABS: ORAL_TABLET | ORAL | 2 refills | Status: DC

## 2016-11-21 NOTE — Patient Instructions (Addendum)
Refine your evening reading so it doesn' t contribute to your  insomnia  No blue lit devices within 2 hours of bedtime,  And choose Less provocative bedtime stories and readings   Watch .org may be helpful because of the devotionals on the "jump to" page    Daytime brain exercise:  Luminosity   Get your husband to make a bucket list while he can still travel.

## 2016-11-21 NOTE — Progress Notes (Signed)
Subjective:  Patient Giles: [name], female    DOB: 07-23-1950  Age: 65 y.o. MRN: [id number]  CC: The primary encounter diagnosis was Diabetes mellitus type 2, diet-controlled ([place]). Diagnoses of Insomnia due to anxiety and fear, Hyperlipidemia LDL goal <100, Encounter for immunization, Caregiver with fatigue, and Long term current use of anticoagulant therapy were also pertinent to this visit.  HPI [name] presents for follow up on T2DM and obesity  Husband of 3 yrs has been diagnosed with parkinsons' disease; he is declining rapidly.  AS a result she has had to assume responsibility for all of the household chores,  And maintenance issues.  She feels overwhelmed at times managing everything for both of them, including driving,  yardwork , maintaining the pool.  She has 3 supportive adult children who  live within 5 minutes has chosen not to attend any support groups while he is aware of the burden this has placed on her because hshe does not want to add to his guilt .  She has been managing her stress with more frequent use of xanax .   Eating late at night.  Not exercising.   Weight gain of ten lbs since last visit.   Taking 5 mg coumadin daily  Inr  2.3    Outpatient Medications Prior to Visit  Medication Sig Dispense Refill  . amLODipine (NORVASC) 2.5 MG tablet Take 1 tablet (2.5 mg total) by mouth daily. 90 tablet 1  . aspirin 81 MG tablet Take 81 mg by mouth daily.    . Cholecalciferol (VITAMIN D3) 1000 UNITS CAPS Take by mouth.    . furosemide (LASIX) 20 MG tablet TAKE ONE TABLET BY MOUTH ONCE DAILY 90 tablet 1  . imipramine (TOFRANIL) 25 MG tablet TAKE 1 TABLET BY MOUTH AT BEDTIME 90 tablet 1  . metoprolol succinate (TOPROL-XL) 25 MG 24 hr tablet TAKE ONE TABLET BY MOUTH ONCE DAILY 90 tablet 3  . Omega-3 Fatty Acids (FISH OIL) 1000 MG CAPS Take 1 capsule daily by mouth.    Marland Kitchen omeprazole (PRILOSEC) 20 MG capsule Take 20 mg by mouth daily.    . simvastatin (ZOCOR) 40 MG  tablet Take 1 tablet by mouth at  bedtime 90 tablet 0  . traMADol (ULTRAM) 50 MG tablet Take 1 tablet (50 mg total) by mouth every 8 (eight) hours as needed. 90 tablet 4  . warfarin (COUMADIN) 5 MG tablet Take 1 tablet (5 mg total) by mouth daily. 30 tablet 3  . ALPRAZolam (XANAX) 0.25 MG tablet Take 1 tablet (0.25 mg total) by mouth at bedtime as needed for anxiety. 30 tablet 3  . butalbital-acetaminophen-caffeine (FIORICET, ESGIC) 50-325-40 MG tablet TAKE ONE TABLET BY MOUTH EVERY 6 HOURS AS NEEDED FOR HEADACHE 90 tablet 0  . simvastatin (ZOCOR) 40 MG tablet TAKE ONE TABLET BY MOUTH AT BEDTIME (Patient not taking: Reported on 11/21/2016) 90 tablet 1  . zoster vaccine live, PF, (ZOSTAVAX) 03212 UNT/0.65ML injection Inject 19,400 Units into the skin once. (Patient not taking: Reported on 05/23/2016) 1 each 0   No facility-administered medications prior to visit.     Review of Systems;  Patient denies headache, fevers, malaise, unintentional weight loss, skin rash, eye pain, sinus congestion and sinus pain, sore throat, dysphagia,  hemoptysis , cough, dyspnea, wheezing, chest pain, palpitations, orthopnea, edema, abdominal pain, nausea, melena, diarrhea, constipation, flank pain, dysuria, hematuria, urinary  Frequency, nocturia, numbness, tingling, seizures,  Focal weakness, Loss of consciousness,  Tremor, insomnia, depression, anxiety,  and suicidal ideation.      Objective:  BP (!) 132/58 (BP Location: Left Arm, Patient Position: Sitting, Cuff Size: Normal)   Pulse 69   Temp 98.9 F (37.2 C) (Oral)   Resp 15   Ht 5' 1.5" (1.562 m)   Wt 159 lb 9.6 oz (72.4 kg)   SpO2 97%   BMI 29.67 kg/m   BP Readings from Last 3 Encounters:  11/21/16 (!) 132/58  05/23/16 116/76  03/21/16 116/64    Wt Readings from Last 3 Encounters:  11/21/16 159 lb 9.6 oz (72.4 kg)  05/23/16 149 lb 6.4 oz (67.8 kg)  03/21/16 148 lb 12.8 oz (67.5 kg)    General appearance: alert, cooperative and appears stated  age Ears: normal TM's and external ear canals both ears Throat: lips, mucosa, and tongue normal; teeth and gums normal Neck: no adenopathy, no carotid bruit, supple, symmetrical, trachea midline and thyroid not enlarged, symmetric, no tenderness/mass/nodules Back: symmetric, no curvature. ROM normal. No CVA tenderness. Lungs: clear to auscultation bilaterally Heart: regular rate and rhythm, S1, S2 normal, no murmur, click, rub or gallop Abdomen: soft, non-tender; bowel sounds normal; no masses,  no organomegaly Pulses: 2+ and symmetric Skin: Skin color, texture, turgor normal. No rashes or lesions Lymph nodes: Cervical, supraclavicular, and axillary nodes normal.  Lab Results  Component Value Date   HGBA1C 5.9 (H) 11/21/2016   HGBA1C 6.3 08/13/2016   HGBA1C 6.2 03/04/2016    Lab Results  Component Value Date   CREATININE 0.86 11/21/2016   CREATININE 0.88 08/13/2016   CREATININE 1.03 12/10/2015    Lab Results  Component Value Date   WBC 4.5 11/22/2015   HGB 15.5 11/22/2015   HCT 47.0 11/22/2015   PLT 163 11/22/2015   GLUCOSE 97 11/21/2016   CHOL 188 11/21/2016   TRIG 175 (H) 11/21/2016   HDL 58 11/21/2016   LDLDIRECT 111.0 08/13/2016   LDLCALC 108 (H) 08/13/2016   ALT 20 11/21/2016   AST 23 11/21/2016   NA 143 11/21/2016   K 4.2 11/21/2016   CL 102 11/21/2016   CREATININE 0.86 11/21/2016   BUN 8 11/21/2016   CO2 31 11/21/2016   TSH 2.62 11/18/2016   INR 2.3 (H) 11/18/2016   HGBA1C 5.9 (H) 11/21/2016   MICROALBUR 0.3 11/21/2016    Dexascan  Result Date: 05/13/2016 EXAM: DUAL X-RAY ABSORPTIOMETRY (DXA) FOR BONE MINERAL DENSITY IMPRESSION: Dear Dr. Crecencio Mc, Your patient [name] completed a BMD test on 05/13/2016 using the [place] (analysis version: 14.10) manufactured by [employer]. The following summarizes the results of our evaluation. PATIENT BIOGRAPHICAL: Name: [name], [name]:  [id number] Birth Date: 1950/12/11 Height:     61.5  in. Weight:     148.8 lbs. Gender:      Female Exam Date:  05/13/2016 Indications: Caucasian, History of Spinal Surgery, Hysterectomy, Postmenopausal, Low calcium intake Fractures: Treatments: ASSESSMENT: The BMD measured at Femur Neck Left is 0.688 g/cm2 with a T-score of -2.5. This patient is considered OSTEOPOROTIC according to [facility]) criteria. Site Region Measured Measured WHO Young Adult BMD Date       Age      Classification T-score AP Spine L1-L4 05/13/[age].1 Osteoporosis -2.5 0.882 g/cm2 DualFemur Neck Left 05/13/2016 66.1 Osteoporosis -2.5 0.688 g/cm2 World Health Organization [facility]) criteria for post-menopausal, Caucasian Women: Normal:       T-score at or above -1 SD Osteopenia:   T-score between -1 and -2.5 SD  Osteoporosis: T-score at or below -2.5 SD RECOMMENDATIONS: [place] recommends that FDA-approved medical therapies be considered in postmenopausal women and men age 65 or older with a: 1. Hip or vertebral (clinical or morphometric) fracture. 2. T-score of < -2.5 at the spine or hip. 3. Ten-year fracture probability by FRAX of 3% or greater for hip fracture or 20% or greater for major osteoporotic fracture. All treatment decisions require clinical judgment and consideration of individual patient factors, including patient preferences, co-morbidities, previous drug use, risk factors not captured in the FRAX model (e.g. falls, vitamin D deficiency, increased bone turnover, interval significant decline in bone density) and possible under - or over-estimation of fracture risk by FRAX. All patients should ensure an adequate intake of dietary calcium (1200 mg/d) and vitamin D (800 IU daily) unless contraindicated. FOLLOW-UP: People with diagnosed cases of osteoporosis or at high risk for fracture should have regular bone mineral density tests. For patients eligible for Medicare, routine testing is allowed once every 2 years. The testing frequency can be  increased to one year for patients who have rapidly progressing disease, those who are receiving or discontinuing medical therapy to restore bone mass, or have additional risk factors. I have reviewed this report, and agree with the above findings. Mark A. Thornton Papas, M.D. [facility] Radiology Electronically Signed   By: Lavonia Dana M.D.   On: 05/13/2016 09:52    Assessment & Plan:   Problem List Items Addressed This Visit    Caregiver with fatigue    Secondary to husband's decline due to Parkinson's Disease. A total of 25 minutes of face to face time was spent with patient more than half of which was spent in counselling on the above mentioned issues.       Diabetes mellitus type 2, diet-controlled ([place]) - Primary    She is following a  low glycemic index diet and her diabetes is diet controlled.   A1c is now 5.9.    Taking an aspirin and simvastatin daily   Lab Results  Component Value Date   HGBA1C 5.9 (H) 11/21/2016   Lab Results  Component Value Date   MICROALBUR 0.3 11/21/2016           Relevant Orders   Hemoglobin A1c (Completed)   Microalbumin / creatinine urine ratio (Completed)   Comprehensive metabolic panel (Completed)   Hyperlipidemia LDL goal <100   Relevant Orders   Lipid panel (Completed)   Long term current use of anticoagulant therapy    Coumadin level is therapeutic,  Continue current regimen  Of 5 mg daily and repeat PT/INR in one month. Lab Results  Component Value Date   INR 2.3 (H) 11/18/2016   INR 3.7 (H) 10/28/2016   INR 3.1 (H) 09/25/2016   PROTIME 57.9 (A) 02/17/2014          Other Visit Diagnoses    Insomnia due to anxiety and fear       Relevant Medications   ALPRAZolam (XANAX) 0.25 MG tablet   Encounter for immunization       Relevant Orders   Flu vaccine HIGH DOSE PF (Completed)      I have discontinued Velecia F. Paolella's zoster vaccine live (PF). I have also changed her ALPRAZolam. Additionally, I am having her maintain her omeprazole,  Vitamin D3, aspirin, simvastatin, traMADol, amLODipine, metoprolol succinate, furosemide, imipramine, warfarin, Fish Oil, and butalbital-acetaminophen-caffeine.  Meds ordered this encounter  Medications  . Omega-3 Fatty Acids (FISH OIL) 1000 MG CAPS  Sig: Take 1 capsule daily by mouth.  . butalbital-acetaminophen-caffeine (FIORICET, ESGIC) 50-325-40 MG tablet    Sig: TAKE ONE TABLET BY MOUTH EVERY 6 HOURS AS NEEDED FOR HEADACHE    Dispense:  90 tablet    Refill:  2  . ALPRAZolam (XANAX) 0.25 MG tablet    Sig: Take 1 tablet (0.25 mg total) at bedtime as needed by mouth for anxiety.    Dispense:  30 tablet    Refill:  3    Medications Discontinued During This Encounter  Medication Reason  . simvastatin (ZOCOR) 40 MG tablet Patient has not taken in last 30 days  . zoster vaccine live, PF, (ZOSTAVAX) 92446 UNT/0.65ML injection Patient has not taken in last 30 days  . butalbital-acetaminophen-caffeine (FIORICET, ESGIC) 50-325-40 MG tablet Reorder  . ALPRAZolam (XANAX) 0.25 MG tablet Reorder    Follow-up: Return in about 6 months (around 05/21/2017).   Crecencio Mc, MD

## 2016-11-22 ENCOUNTER — Encounter: Payer: Self-pay | Admitting: Internal Medicine

## 2016-11-22 DIAGNOSIS — R5383 Other fatigue: Secondary | ICD-10-CM | POA: Insufficient documentation

## 2016-11-22 DIAGNOSIS — Z634 Disappearance and death of family member: Secondary | ICD-10-CM | POA: Insufficient documentation

## 2016-11-22 LAB — HEMOGLOBIN A1C
EAG (MMOL/L): 6.8 (calc)
Hgb A1c MFr Bld: 5.9 % of total Hgb — ABNORMAL HIGH (ref <5.7)
MEAN PLASMA GLUCOSE: 123 (calc)

## 2016-11-22 LAB — COMPREHENSIVE METABOLIC PANEL
AG Ratio: 1.8 (calc) (ref 1.0–2.5)
ALKALINE PHOSPHATASE (APISO): 84 U/L (ref 33–130)
ALT: 20 U/L (ref 6–29)
AST: 23 U/L (ref 10–35)
Albumin: 4.4 g/dL (ref 3.6–5.1)
BILIRUBIN TOTAL: 0.5 mg/dL (ref 0.2–1.2)
BUN: 8 mg/dL (ref 7–25)
CALCIUM: 9.3 mg/dL (ref 8.6–10.4)
CHLORIDE: 102 mmol/L (ref 98–110)
CO2: 31 mmol/L (ref 20–32)
CREATININE: 0.86 mg/dL (ref 0.50–0.99)
Globulin: 2.4 g/dL (calc) (ref 1.9–3.7)
Glucose, Bld: 97 mg/dL (ref 65–99)
Potassium: 4.2 mmol/L (ref 3.5–5.3)
Sodium: 143 mmol/L (ref 135–146)
Total Protein: 6.8 g/dL (ref 6.1–8.1)

## 2016-11-22 LAB — LIPID PANEL
Cholesterol: 188 mg/dL (ref <200)
HDL: 58 mg/dL (ref >50)
LDL Cholesterol (Calc): 101 mg/dL (calc) — ABNORMAL HIGH
NON-HDL CHOLESTEROL (CALC): 130 mg/dL — AB (ref <130)
Total CHOL/HDL Ratio: 3.2 (calc) (ref <5.0)
Triglycerides: 175 mg/dL — ABNORMAL HIGH (ref <150)

## 2016-11-22 LAB — MICROALBUMIN / CREATININE URINE RATIO
Creatinine, Urine: 29 mg/dL (ref 20–275)
MICROALB UR: 0.3 mg/dL
MICROALB/CREAT RATIO: 10 ug/mg{creat} (ref <30)

## 2016-11-22 NOTE — Assessment & Plan Note (Signed)
She is following a  low glycemic index diet and her diabetes is diet controlled.   A1c is now 5.9.    Taking an aspirin and simvastatin daily   Lab Results  Component Value Date   HGBA1C 5.9 (H) 11/21/2016   Lab Results  Component Value Date   MICROALBUR 0.3 11/21/2016

## 2016-11-22 NOTE — Assessment & Plan Note (Signed)
Coumadin level is therapeutic,  Continue current regimen  Of 5 mg daily and repeat PT/INR in one month. Lab Results  Component Value Date   INR 2.3 (H) 11/18/2016   INR 3.7 (H) 10/28/2016   INR 3.1 (H) 09/25/2016   PROTIME 57.9 (A) 02/17/2014

## 2016-11-22 NOTE — Assessment & Plan Note (Signed)
Secondary to husband's decline due to Parkinson's Disease. A total of 25 minutes of face to face time was spent with patient more than half of which was spent in counselling on the above mentioned issues.

## 2016-12-26 ENCOUNTER — Other Ambulatory Visit (INDEPENDENT_AMBULATORY_CARE_PROVIDER_SITE_OTHER): Payer: PPO

## 2016-12-26 DIAGNOSIS — Z7901 Long term (current) use of anticoagulants: Secondary | ICD-10-CM | POA: Diagnosis not present

## 2016-12-26 LAB — PROTIME-INR
INR: 1.4 ratio — ABNORMAL HIGH (ref 0.8–1.0)
Prothrombin Time: 15.5 s — ABNORMAL HIGH (ref 9.6–13.1)

## 2016-12-27 ENCOUNTER — Encounter: Payer: Self-pay | Admitting: Internal Medicine

## 2016-12-28 ENCOUNTER — Other Ambulatory Visit: Payer: Self-pay | Admitting: Internal Medicine

## 2016-12-28 DIAGNOSIS — Z7901 Long term (current) use of anticoagulants: Secondary | ICD-10-CM

## 2016-12-28 MED ORDER — WARFARIN SODIUM 1 MG PO TABS: 1.0000 mg | ORAL_TABLET | Freq: Every day | ORAL | 0 refills | Status: DC

## 2016-12-28 MED ORDER — WARFARIN SODIUM 5 MG PO TABS: 5.0000 mg | ORAL_TABLET | Freq: Every day | ORAL | 3 refills | Status: DC

## 2016-12-28 NOTE — Progress Notes (Signed)
MyChart message sent : COUMADIN DOSE INCREASED TO 10 MG ONCE ON DEC 23,  6 MG DAILY ON DEC 24,  REPEAT INR 2 WEEKS

## 2017-01-05 ENCOUNTER — Other Ambulatory Visit: Payer: Self-pay | Admitting: Internal Medicine

## 2017-01-14 ENCOUNTER — Other Ambulatory Visit (INDEPENDENT_AMBULATORY_CARE_PROVIDER_SITE_OTHER): Payer: PPO

## 2017-01-14 ENCOUNTER — Encounter: Payer: Self-pay | Admitting: Internal Medicine

## 2017-01-14 DIAGNOSIS — Z7901 Long term (current) use of anticoagulants: Secondary | ICD-10-CM

## 2017-01-14 LAB — PROTIME-INR
INR: 2 ratio — ABNORMAL HIGH (ref 0.8–1.0)
Prothrombin Time: 21 s — ABNORMAL HIGH (ref 9.6–13.1)

## 2017-02-01 DIAGNOSIS — M25562 Pain in left knee: Secondary | ICD-10-CM | POA: Diagnosis not present

## 2017-02-09 DIAGNOSIS — D1800 Hemangioma unspecified site: Secondary | ICD-10-CM | POA: Diagnosis not present

## 2017-02-09 DIAGNOSIS — L821 Other seborrheic keratosis: Secondary | ICD-10-CM | POA: Diagnosis not present

## 2017-02-16 ENCOUNTER — Other Ambulatory Visit: Payer: PPO

## 2017-02-16 ENCOUNTER — Other Ambulatory Visit: Payer: Self-pay | Admitting: Internal Medicine

## 2017-02-17 ENCOUNTER — Other Ambulatory Visit (INDEPENDENT_AMBULATORY_CARE_PROVIDER_SITE_OTHER): Payer: PPO

## 2017-02-17 DIAGNOSIS — Z7901 Long term (current) use of anticoagulants: Secondary | ICD-10-CM | POA: Diagnosis not present

## 2017-02-17 LAB — PROTIME-INR
INR: 3.9 ratio — ABNORMAL HIGH (ref 0.8–1.0)
Prothrombin Time: 41.9 s — ABNORMAL HIGH (ref 9.6–13.1)

## 2017-02-19 ENCOUNTER — Encounter: Payer: Self-pay | Admitting: Internal Medicine

## 2017-02-25 ENCOUNTER — Encounter: Payer: Self-pay | Admitting: Internal Medicine

## 2017-02-26 ENCOUNTER — Telehealth: Payer: Self-pay | Admitting: Internal Medicine

## 2017-02-26 NOTE — Telephone Encounter (Signed)
Needs INR rechecked today or tomorrow . please schedule

## 2017-02-26 NOTE — Telephone Encounter (Signed)
Spoke with pt and she is scheduled for tomorrow at 9am.

## 2017-02-27 ENCOUNTER — Other Ambulatory Visit (INDEPENDENT_AMBULATORY_CARE_PROVIDER_SITE_OTHER): Payer: PPO

## 2017-02-27 ENCOUNTER — Other Ambulatory Visit: Payer: Self-pay | Admitting: Internal Medicine

## 2017-02-27 DIAGNOSIS — Z7901 Long term (current) use of anticoagulants: Secondary | ICD-10-CM | POA: Diagnosis not present

## 2017-02-27 LAB — PROTIME-INR
INR: 3.6 ratio — AB (ref 0.8–1.0)
Prothrombin Time: 38.7 s — ABNORMAL HIGH (ref 9.6–13.1)

## 2017-02-27 NOTE — Assessment & Plan Note (Signed)
INR repeated and still high on 6 mg daily.  Advised to skip one day and reduce her  daily dose to 5 mg .  Repeat INR 2 weeks

## 2017-03-09 DIAGNOSIS — E119 Type 2 diabetes mellitus without complications: Secondary | ICD-10-CM | POA: Diagnosis not present

## 2017-03-09 LAB — HM DIABETES EYE EXAM

## 2017-03-12 DIAGNOSIS — L578 Other skin changes due to chronic exposure to nonionizing radiation: Secondary | ICD-10-CM | POA: Diagnosis not present

## 2017-03-12 DIAGNOSIS — Z86018 Personal history of other benign neoplasm: Secondary | ICD-10-CM | POA: Diagnosis not present

## 2017-03-18 ENCOUNTER — Other Ambulatory Visit (INDEPENDENT_AMBULATORY_CARE_PROVIDER_SITE_OTHER): Payer: PPO

## 2017-03-18 DIAGNOSIS — Z7901 Long term (current) use of anticoagulants: Secondary | ICD-10-CM

## 2017-03-18 LAB — PROTIME-INR
INR: 3.8 ratio — ABNORMAL HIGH (ref 0.8–1.0)
PROTHROMBIN TIME: 40.7 s — AB (ref 9.6–13.1)

## 2017-03-19 ENCOUNTER — Other Ambulatory Visit: Payer: Self-pay | Admitting: Internal Medicine

## 2017-03-19 DIAGNOSIS — Z7901 Long term (current) use of anticoagulants: Secondary | ICD-10-CM

## 2017-03-19 MED ORDER — WARFARIN SODIUM 4 MG PO TABS: 4.0000 mg | ORAL_TABLET | Freq: Every day | ORAL | 1 refills | Status: DC

## 2017-03-19 NOTE — Assessment & Plan Note (Signed)
Dose reduced to 4 mg daily for INR 3.8

## 2017-03-23 ENCOUNTER — Ambulatory Visit (INDEPENDENT_AMBULATORY_CARE_PROVIDER_SITE_OTHER): Payer: PPO

## 2017-03-23 VITALS — BP 132/70 | HR 81 | Temp 98.1°F | Resp 14 | Ht 61.5 in | Wt 162.8 lb

## 2017-03-23 DIAGNOSIS — Z0000 Encounter for general adult medical examination without abnormal findings: Secondary | ICD-10-CM | POA: Diagnosis not present

## 2017-03-23 NOTE — Patient Instructions (Addendum)
[name] , Thank you for taking time to come for your Medicare Wellness Visit. I appreciate your ongoing commitment to your health goals. Please review the following plan we discussed and let me know if I can assist you in the future.   Follow up with Dr. Derrel Nip as needed.    Bring a copy of your [place] and/or Living Will to be scanned into chart.  Have a great day!  These are the goals we discussed: Goals    . DIET - INCREASE LEAN PROTEINS     Low carb foods    . Increase physical activity     Walk for exercise 150 min per week       This is a list of the screening recommended for you and due dates:  Health Maintenance  Topic Date Due  . Complete foot exam   04/01/1960  . Hemoglobin A1C  05/21/2017  . Eye exam for diabetics  06/10/2017  . Urine Protein Check  11/21/2017  . Mammogram  02/05/2018  . Tetanus Vaccine  10/28/2022  . Colon Cancer Screening  12/05/2024  . Flu Shot  Completed  . DEXA scan (bone density measurement)  Completed  .  Hepatitis C: One time screening is recommended by Center for Disease Control  (CDC) for  adults born from [birth year] through 1966.   Completed  . Pneumonia vaccines  Completed    Mammogram A mammogram is an X-ray of the breasts that is done to check for abnormal changes. This procedure can screen for and detect any changes that may suggest breast cancer. A mammogram can also identify other changes and variations in the breast, such as:  Inflammation of the breast tissue (mastitis).  An infected area that contains a collection of pus (abscess).  A fluid-filled sac (cyst).  Fibrocystic changes. This is when breast tissue becomes denser, which can make the tissue feel rope-like or uneven under the skin.  Tumors that are not cancerous (benign).  Tell a health care provider about:  Any allergies you have.  If you have breast implants.  If you have had previous breast disease, biopsy, or surgery.  If you are  breastfeeding.  Any possibility that you could be pregnant, if this applies.  If you are younger than age 6.  If you have a family history of breast cancer. What are the risks? Generally, this is a safe procedure. However, problems may occur, including:  Exposure to radiation. Radiation levels are very low with this test.  The results being misinterpreted.  The need for further tests.  The inability of the mammogram to detect certain cancers.  What happens before the procedure?  Schedule your test about 1-2 weeks after your menstrual period. This is usually when your breasts are the least tender.  If you have had a mammogram done at a different facility in the past, get the mammogram X-rays or have them sent to your current exam facility in order to compare them.  Wash your breasts and under your arms the day of the test.  Do not wear deodorants, perfumes, lotions, or powders anywhere on your body on the day of the test.  Remove any jewelry from your neck.  Wear clothes that you can change into and out of easily. What happens during the procedure?  You will undress from the waist up and put on a gown.  You will stand in front of the X-ray machine.  Each breast will be placed between  two plastic or glass plates. The plates will compress your breast for a few seconds. Try to stay as relaxed as possible during the procedure. This does not cause any harm to your breasts and any discomfort you feel will be very brief.  X-rays will be taken from different angles of each breast. The procedure may vary among health care providers and hospitals. What happens after the procedure?  The mammogram will be examined by a specialist (radiologist).  You may need to repeat certain parts of the test, depending on the quality of the images. This is commonly done if the radiologist needs a better view of the breast tissue.  Ask when your test results will be ready. Make sure you get your  test results.  You may resume your normal activities. This information is not intended to replace advice given to you by your health care provider. Make sure you discuss any questions you have with your health care provider. Document Released: 12/21/1999 Document Revised: 05/28/2015 Document Reviewed: 03/03/2014 Elsevier Interactive Patient Education  [employer].

## 2017-03-23 NOTE — Progress Notes (Addendum)
Subjective:   [name] is a 67 y.o. female who presents for Medicare Annual (Subsequent) preventive examination.  Review of Systems:  No ROS.  Medicare Wellness Visit. Additional risk factors are reflected in the social history.  Cardiac Risk Factors include: advanced age (>64men, >60 women);hypertension;diabetes mellitus     Objective:     Vitals: BP 132/70 (BP Location: Left Arm, Patient Position: Sitting, Cuff Size: Normal)   Pulse 81   Temp 98.1 F (36.7 C) (Oral)   Resp 14   Ht 5' 1.5" (1.562 m)   Wt 162 lb 12.8 oz (73.8 kg)   SpO2 97%   BMI 30.26 kg/m   Body mass index is 30.26 kg/m.  Advanced Directives 03/23/2017 03/21/2016 11/22/2015 11/16/2014 05/19/2014  Does Patient Have a Medical Advance Directive? Yes Yes No No Yes  Type of Advance Directive Living will;Healthcare Power of [place];Living will - - [place]  Does patient want to make changes to medical advance directive? No - Patient declined No - Patient declined - - No - Patient declined  Copy of [place] in Chart? No - copy requested No - copy requested - - No - copy requested  Would patient like information on creating a medical advance directive? - - No - patient declined information No - patient declined information -    Tobacco Social History   Tobacco Use  Smoking Status Never Smoker  Smokeless Tobacco Never Used     Counseling given: Not Answered   Clinical Intake:  Pre-visit preparation completed: Yes  Pain : No/denies pain     Nutritional Status: BMI 25 -29 Overweight Diabetes: Yes(Followed by PCP)  How often do you need to have someone help you when you read instructions, pamphlets, or other written materials from your doctor or pharmacy?: 1 - Never  Interpreter Needed?: No     Past Medical History:  Diagnosis Date  . Diabetes mellitus without complication ([place])   . H/O toxic shock syndrome 2001   post  urologic procedure for stone removal  . 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 Hyperlipidemia   . Hypertension   . Ischemic colitis ([place]) 2012-2013   s/p left colectomy May 2013  . Kidney stones    Past Surgical History:  Procedure Laterality Date  . ABDOMINAL HYSTERECTOMY  1996  . APPENDECTOMY    . bilateral tubal ligation    . BREAST EXCISIONAL BIOPSY Left 1989   benign  . BREAST SURGERY  1989   left biopsy, normal  . COLECTOMY Left May 2013   [place], ischemic colitis  . COLONOSCOPY WITH PROPOFOL N/A 11/16/2014   Procedure: COLONOSCOPY WITH PROPOFOL;  Surgeon: Manya Silvas, MD;  Location: [facility] ENDOSCOPY;  Service: Endoscopy;  Laterality: N/A;  . ESOPHAGOGASTRODUODENOSCOPY (EGD) WITH PROPOFOL N/A 11/16/2014   Procedure: ESOPHAGOGASTRODUODENOSCOPY (EGD) WITH PROPOFOL;  Surgeon: Manya Silvas, MD;  Location: [facility] ENDOSCOPY;  Service: Endoscopy;  Laterality: N/A;  . SPINE SURGERY  1984   lumbar diskectomy, [place]  . ureterolithotomy     Family History  Problem Relation Age of Onset  . Hypertension Mother   . Cancer Mother   . Diabetes Mother   . Heart disease Father   . Hypertension Father   . Diabetes Father   . Breast cancer Paternal Aunt        <50  . Breast cancer Cousin   .  Alzheimer's disease Sister    Social History   Socioeconomic History  . Marital status: Married    Spouse name: None  . Number of children: None  . Years of education: None  . Highest education level: None  Social Needs  . Financial resource strain: Not hard at all  . Food insecurity - worry: Never true  . Food insecurity - inability: Never true  . Transportation needs - medical: No  . Transportation needs - non-medical: No  Occupational History  . None  Tobacco Use  . Smoking status: Never Smoker  . Smokeless tobacco: Never Used  Substance and Sexual Activity  . Alcohol use: No  . Drug use: No  . Sexual activity: Yes    Other Topics Concern  . None  Social History Narrative  . None    Outpatient Encounter Medications as of 03/23/2017  Medication Sig  . ALPRAZolam (XANAX) 0.25 MG tablet Take 1 tablet (0.25 mg total) at bedtime as needed by mouth for anxiety.  Marland Kitchen amLODipine (NORVASC) 2.5 MG tablet TAKE 1 TABLET BY MOUTH ONCE DAILY  . aspirin 81 MG tablet Take 81 mg by mouth daily.  . butalbital-acetaminophen-caffeine (FIORICET, ESGIC) 50-325-40 MG tablet TAKE ONE TABLET BY MOUTH EVERY 6 HOURS AS NEEDED FOR HEADACHE  . Cholecalciferol (VITAMIN D3) 1000 UNITS CAPS Take by mouth.  . furosemide (LASIX) 20 MG tablet TAKE 1 TABLET BY MOUTH ONCE DAILY  . imipramine (TOFRANIL) 25 MG tablet TAKE 1 TABLET BY MOUTH AT BEDTIME  . metoprolol succinate (TOPROL-XL) 25 MG 24 hr tablet TAKE ONE TABLET BY MOUTH ONCE DAILY  . Omega-3 Fatty Acids (FISH OIL) 1000 MG CAPS Take 1 capsule daily by mouth.  Marland Kitchen omeprazole (PRILOSEC) 20 MG capsule Take 20 mg by mouth daily.  . simvastatin (ZOCOR) 40 MG tablet TAKE 1 TABLET BY MOUTH AT BEDTIME  . traMADol (ULTRAM) 50 MG tablet Take 1 tablet (50 mg total) by mouth every 8 (eight) hours as needed.  . warfarin (COUMADIN) 4 MG tablet Take 1 tablet (4 mg total) by mouth daily.   No facility-administered encounter medications on file as of 03/23/2017.     Activities of Daily Living In your present state of health, do you have any difficulty performing the following activities: 03/23/2017  Hearing? Y  Comment Hearing aids  Vision? N  Difficulty concentrating or making decisions? N  Walking or climbing stairs? N  Dressing or bathing? N  Doing errands, shopping? N  Preparing Food and eating ? N  Using the Toilet? N  In the past six months, have you accidently leaked urine? N  Do you have problems with loss of bowel control? N  Managing your Medications? N  Managing your Finances? N  Housekeeping or managing your Housekeeping? N  Some recent data might be hidden    Patient Care  Team: Crecencio Mc, MD as PCP - General (Internal Medicine) Manya Silvas, MD (Gastroenterology)    Assessment:   This is a routine wellness examination for [name].  The goal of the wellness visit is to assist the patient how to close the gaps in care and create a preventative care plan for the patient.   The roster of all physicians providing medical care to patient is listed in the Snapshot section of the chart.  Taking calcium VIT D as appropriate/Osteoporosis reviewed.    Safety issues reviewed; Smoke and carbon monoxide detectors in the home. No firearms in the home. Wears seatbelts when driving or riding  with others. No violence in the home.  They do not have excessive sun exposure.  Discussed the need for sun protection: hats, long sleeves and the use of sunscreen if there is significant sun exposure.  Patient is alert, normal appearance, oriented to person/place/and time.  Correctly identified the president of the [place] and recalls of 3/3 words. Performs simple calculations and can read correct time from watch face.  Displays appropriate judgement.  No new identified risk were noted.  No failures at ADL's or IADL's.    BMI- discussed the importance of a healthy diet, water intake and the benefits of aerobic exercise. Educational material provided.   24 hour diet recall: Regular diet.  Low carb diet encouraged.    Daily fluid intake: 1 cups of caffeine, 4-5 cups of water  Dental- every 6 months.  Eye- Visual acuity not assessed per patient preference since they have regular follow up with the ophthalmologist.  Wears corrective lenses.  Sleep patterns- Sleeps 6 hours at night.  Wakes feeling tired.   Diabetes- followed by PCP.  Mammogram discussed- she plans to schedule annual exam.  Educational material provided.   Patient Concerns: High BP readings at home.  Log placed in PCP box.  Chronic headaches worsening.  Tender to touch on front R side of head.  Follow  up appointment scheduled; bring BP machine to next visit.    Exercise Activities and Dietary recommendations Current Exercise Habits: Home exercise routine, Type of exercise: walking, Time (Minutes): 25, Frequency (Times/Week): 5, Weekly Exercise (Minutes/Week): 125, Intensity: Mild  Goals    . DIET - INCREASE LEAN PROTEINS     Low carb foods    . Increase physical activity     Walk for exercise 150 min per week       Fall Risk Fall Risk  03/23/2017 03/21/2016 05/19/2014  Falls in the past year? No No Yes  Number falls in past yr: - - 1  Injury with Fall? - - Yes  Comment - - Knee injury followed by Dr. Tamala Julian MCL injury  Risk for fall due to : - - Other (Comment)  Risk for fall due to: Comment - - Patient had injuried opposite knee and was walking upstairs when she put too much pressure on the leg and it gave out   Depression Screen PHQ 2/9 Scores 03/23/2017 03/21/2016 05/19/2014  PHQ - 2 Score 0 0 0     Cognitive Function MMSE - Mini Mental State Exam 03/23/2017 03/21/2016  Orientation to time 5 5  Orientation to Place 5 5  Registration 3 3  Attention/ Calculation 5 5  Recall 3 3  Language- name 2 objects 2 2  Language- repeat 1 1  Language- follow 3 step command 3 3  Language- read & follow direction 1 1  Write a sentence 1 1  Copy design 1 1  Total score 30 30        Immunization History  Administered Date(s) Administered  . Influenza, High Dose Seasonal PF 09/03/2015, 11/21/2016  . Influenza,inj,Quad PF,6+ Mos 10/27/2012, 11/02/2013  . Influenza-Unspecified 11/07/2014  . Pneumococcal Conjugate-13 05/19/2014  . Pneumococcal Polysaccharide-23 05/31/2015  . Tdap 10/27/2012    Screening Tests Health Maintenance  Topic Date Due  . FOOT EXAM  04/01/1960  . HEMOGLOBIN A1C  05/21/2017  . OPHTHALMOLOGY EXAM  06/10/2017  . URINE MICROALBUMIN  11/21/2017  . MAMMOGRAM  02/05/2018  . TETANUS/TDAP  10/28/2022  . COLONOSCOPY  12/05/2024  . INFLUENZA VACCINE  Completed    .  DEXA SCAN  Completed  . Hepatitis C Screening  Completed  . PNA vac Low Risk Adult  Completed      Plan:    End of life planning; Advance aging; Advanced directives discussed. Copy of current HCPOA/Living Will requested.    I have personally reviewed and noted the following in the patient's chart:   . Medical and social history . Use of alcohol, tobacco or illicit drugs  . Current medications and supplements . Functional ability and status . Nutritional status . Physical activity . Advanced directives . List of other physicians . Hospitalizations, surgeries, and ER visits in previous 12 months . Vitals . Screenings to include cognitive, depression, and falls . Referrals and appointments  In addition, I have reviewed and discussed with patient certain preventive protocols, quality metrics, and best practice recommendations. A written personalized care plan for preventive services as well as general preventive health recommendations were provided to patient.     OBrien-Blaney, Darcel Frane L, LPN  07/21/9676    I have reviewed the above information and agree with above.   Deborra Medina, MD

## 2017-03-24 ENCOUNTER — Encounter: Payer: Self-pay | Admitting: Internal Medicine

## 2017-03-24 ENCOUNTER — Ambulatory Visit (INDEPENDENT_AMBULATORY_CARE_PROVIDER_SITE_OTHER): Payer: PPO

## 2017-03-24 ENCOUNTER — Ambulatory Visit (INDEPENDENT_AMBULATORY_CARE_PROVIDER_SITE_OTHER): Payer: PPO | Admitting: Internal Medicine

## 2017-03-24 VITALS — BP 134/82 | HR 81 | Temp 97.8°F | Resp 14 | Ht 61.5 in | Wt 162.6 lb

## 2017-03-24 DIAGNOSIS — E538 Deficiency of other specified B group vitamins: Secondary | ICD-10-CM

## 2017-03-24 DIAGNOSIS — E785 Hyperlipidemia, unspecified: Secondary | ICD-10-CM | POA: Diagnosis not present

## 2017-03-24 DIAGNOSIS — M542 Cervicalgia: Secondary | ICD-10-CM

## 2017-03-24 DIAGNOSIS — E1169 Type 2 diabetes mellitus with other specified complication: Secondary | ICD-10-CM | POA: Diagnosis not present

## 2017-03-24 DIAGNOSIS — R200 Anesthesia of skin: Secondary | ICD-10-CM | POA: Diagnosis not present

## 2017-03-24 DIAGNOSIS — E119 Type 2 diabetes mellitus without complications: Secondary | ICD-10-CM | POA: Diagnosis not present

## 2017-03-24 DIAGNOSIS — M47812 Spondylosis without myelopathy or radiculopathy, cervical region: Secondary | ICD-10-CM | POA: Diagnosis not present

## 2017-03-24 DIAGNOSIS — G44221 Chronic tension-type headache, intractable: Secondary | ICD-10-CM

## 2017-03-24 DIAGNOSIS — I10 Essential (primary) hypertension: Secondary | ICD-10-CM

## 2017-03-24 DIAGNOSIS — R202 Paresthesia of skin: Secondary | ICD-10-CM

## 2017-03-24 LAB — COMPREHENSIVE METABOLIC PANEL
ALT: 18 U/L (ref 0–35)
AST: 18 U/L (ref 0–37)
Albumin: 4.3 g/dL (ref 3.5–5.2)
Alkaline Phosphatase: 84 U/L (ref 39–117)
BILIRUBIN TOTAL: 0.5 mg/dL (ref 0.2–1.2)
BUN: 14 mg/dL (ref 6–23)
CHLORIDE: 102 meq/L (ref 96–112)
CO2: 32 meq/L (ref 19–32)
CREATININE: 1.02 mg/dL (ref 0.40–1.20)
Calcium: 9.6 mg/dL (ref 8.4–10.5)
GFR: 57.28 mL/min — ABNORMAL LOW (ref >60.00)
GLUCOSE: 93 mg/dL (ref 70–99)
Potassium: 4 mEq/L (ref 3.5–5.1)
SODIUM: 140 meq/L (ref 135–145)
Total Protein: 6.6 g/dL (ref 6.0–8.3)

## 2017-03-24 LAB — HEMOGLOBIN A1C: HEMOGLOBIN A1C: 6.4 % (ref 4.6–6.5)

## 2017-03-24 LAB — VITAMIN B12: VITAMIN B 12: 173 pg/mL — AB (ref 211–911)

## 2017-03-24 MED ORDER — METOPROLOL SUCCINATE ER 50 MG PO TB24: 50.0000 mg | ORAL_TABLET | Freq: Every day | ORAL | 1 refills | Status: DC

## 2017-03-24 NOTE — Progress Notes (Signed)
Subjective:  Patient ID: TEQULIA GONSALVES, female    DOB: 1950-11-09  Age: 67 y.o. MRN: [id number]  CC: The primary encounter diagnosis was Diabetes mellitus type 2, diet-controlled ([place]). Diagnoses of Numbness and tingling in both hands, Cervicalgia of occipito-atlanto-axial region, Chronic tension-type headache, intractable, Essential hypertension, Hyperlipidemia associated with type 2 diabetes mellitus ([place]), and B12 deficiency were also pertinent to this visit.  HPI CONITA AMENTA presents for new onset headaches accompanied by elevated blood pressure readings .  Home machine is a 86 67 years old    Right sided Headaches have been managed with fioricet .  For the last 2 months possible 4 months frequency has increased from 3 / week to 5 /week .  One used to resolve headache,  Now requires 2.  Tried using tylenol did not help.   Lately scalp on right side has been tender and feels like she is wearing a vice around her head, radiates from neck to to of head . Has tried different pillows.  Aggravated by flexing neck.  . No vision changes,  No nausea.  Fingers get tingly when the headaches  come on .  Waking up with a headache most days.  Denies fine motor skill issues,  Has had a left hand tremor for the last 6 months that is intermittent. Fingers  Become numb  on right side if sleeps on right side   Right handed,  Left hand tremor is effort related  Not at rest.   elveated blood pressure readings  Highest during stressful emotional situations    Told she snores  By husband     Outpatient Medications Prior to Visit  Medication Sig Dispense Refill  . ALPRAZolam (XANAX) 0.25 MG tablet Take 1 tablet (0.25 mg total) at bedtime as needed by mouth for anxiety. 30 tablet 3  . amLODipine (NORVASC) 2.5 MG tablet TAKE 1 TABLET BY MOUTH ONCE DAILY 90 tablet 1  . aspirin 81 MG tablet Take 81 mg by mouth daily.    . butalbital-acetaminophen-caffeine (FIORICET, ESGIC) 50-325-40 MG tablet TAKE ONE TABLET  BY MOUTH EVERY 6 HOURS AS NEEDED FOR HEADACHE 90 tablet 2  . Cholecalciferol (VITAMIN D3) 1000 UNITS CAPS Take by mouth.    . furosemide (LASIX) 20 MG tablet TAKE 1 TABLET BY MOUTH ONCE DAILY 90 tablet 0  . imipramine (TOFRANIL) 25 MG tablet TAKE 1 TABLET BY MOUTH AT BEDTIME 90 tablet 1  . Omega-3 Fatty Acids (FISH OIL) 1000 MG CAPS Take 1 capsule daily by mouth.    Marland Kitchen omeprazole (PRILOSEC) 20 MG capsule Take 20 mg by mouth daily.    . simvastatin (ZOCOR) 40 MG tablet TAKE 1 TABLET BY MOUTH AT BEDTIME 90 tablet 1  . traMADol (ULTRAM) 50 MG tablet Take 1 tablet (50 mg total) by mouth every 8 (eight) hours as needed. 90 tablet 4  . warfarin (COUMADIN) 4 MG tablet Take 1 tablet (4 mg total) by mouth daily. 30 tablet 1  . metoprolol succinate (TOPROL-XL) 25 MG 24 hr tablet TAKE ONE TABLET BY MOUTH ONCE DAILY 90 tablet 3   No facility-administered medications prior to visit.     Review of Systems;  Patient denies headache, fevers, malaise, unintentional weight loss, skin rash, eye pain, sinus congestion and sinus pain, sore throat, dysphagia,  hemoptysis , cough, dyspnea, wheezing, chest pain, palpitations, orthopnea, edema, abdominal pain, nausea, melena, diarrhea, constipation, flank pain, dysuria, hematuria, urinary  Frequency, nocturia, numbness, tingling, seizures,  Focal weakness, Loss of  consciousness,  Tremor, insomnia, depression, anxiety, and suicidal ideation.      Objective:  BP 134/82 (BP Location: Left Arm, Patient Position: Sitting, Cuff Size: Normal)   Pulse 81   Temp 97.8 F (36.6 C) (Oral)   Resp 14   Ht 5' 1.5" (1.562 m)   Wt 162 lb 9.6 oz (73.8 kg)   SpO2 96%   BMI 30.23 kg/m   BP Readings from Last 3 Encounters:  03/24/17 134/82  03/23/17 132/70  11/21/16 (!) 132/58    Wt Readings from Last 3 Encounters:  03/24/17 162 lb 9.6 oz (73.8 kg)  03/23/17 162 lb 12.8 oz (73.8 kg)  11/21/16 159 lb 9.6 oz (72.4 kg)    General appearance: alert, cooperative and appears  stated age Ears: normal TM's and external ear canals both ears Throat: lips, mucosa, and tongue normal; teeth and gums normal Neck: no adenopathy, no carotid bruit, supple, symmetrical, trachea midline and thyroid not enlarged, symmetric, no tenderness/mass/nodules Back: symmetric, no curvature. ROM normal. No CVA tenderness. Lungs: clear to auscultation bilaterally Heart: regular rate and rhythm, S1, S2 normal, no murmur, click, rub or gallop Abdomen: soft, non-tender; bowel sounds normal; no masses,  no organomegaly Pulses: 2+ and symmetric Skin: Skin color, texture, turgor normal. No rashes or lesions Lymph nodes: Cervical, supraclavicular, and axillary nodes normal. Neuro: CNs 2-12 intact. DTRs 2+/4 in biceps, brachioradialis, patellars and achilles. Muscle strength 5/5 in upper and lower exremities. Fine resting tremor bilaterally both hands cerebellar function normal. Romberg negative.  No pronator drift.   Gait normal.   Lab Results  Component Value Date   HGBA1C 6.4 03/24/2017   HGBA1C 5.9 (H) 11/21/2016   HGBA1C 6.3 08/13/2016    Lab Results  Component Value Date   CREATININE 1.02 03/24/2017   CREATININE 0.86 11/21/2016   CREATININE 0.88 08/13/2016    Lab Results  Component Value Date   WBC 4.5 11/22/2015   HGB 15.5 11/22/2015   HCT 47.0 11/22/2015   PLT 163 11/22/2015   GLUCOSE 93 03/24/2017   CHOL 188 11/21/2016   TRIG 175 (H) 11/21/2016   HDL 58 11/21/2016   LDLDIRECT 111.0 08/13/2016   LDLCALC 101 (H) 11/21/2016   ALT 18 03/24/2017   AST 18 03/24/2017   NA 140 03/24/2017   K 4.0 03/24/2017   CL 102 03/24/2017   CREATININE 1.02 03/24/2017   BUN 14 03/24/2017   CO2 32 03/24/2017   TSH 2.62 11/18/2016   INR 3.8 (H) 03/18/2017   HGBA1C 6.4 03/24/2017   MICROALBUR 0.3 11/21/2016    Dexascan  Result Date: 05/13/2016 EXAM: DUAL X-RAY ABSORPTIOMETRY (DXA) FOR BONE MINERAL DENSITY IMPRESSION: Dear Dr. Crecencio Mc, Your patient Tran Randle completed a BMD  test on 05/13/2016 using the Boundary (analysis version: 14.10) manufactured by [employer]. The following summarizes the results of our evaluation. PATIENT BIOGRAPHICAL: Name: Ferrin, Liebig Patient ID:  [id number] Birth Date: 25-Jan-1950 Height:     61.5 in. Weight:     148.8 lbs. Gender:      Female Exam Date:  05/13/2016 Indications: Caucasian, History of Spinal Surgery, Hysterectomy, Postmenopausal, Low calcium intake Fractures: Treatments: ASSESSMENT: The BMD measured at Femur Neck Left is 0.688 g/cm2 with a T-score of -2.5. This patient is considered OSTEOPOROTIC according to [facility]) criteria. Site Region Measured Measured WHO Young Adult BMD Date       Age      Classification T-score AP Spine L1-L4 4/13/[age]  Osteoporosis -2.5 0.882 g/cm2 DualFemur Neck Left 05/13/[age].1 Osteoporosis -2.5 0.688 g/cm2 World Health Organization [facility]) criteria for post-menopausal, Caucasian Women: Normal:       T-score at or above -1 SD Osteopenia:   T-score between -1 and -2.5 SD Osteoporosis: T-score at or below -2.5 SD RECOMMENDATIONS: [facility] recommends that FDA-approved medical therapies be considered in postmenopausal women and men age 52 or older with a: 1. Hip or vertebral (clinical or morphometric) fracture. 2. T-score of < -2.5 at the spine or hip. 3. Ten-year fracture probability by FRAX of 3% or greater for hip fracture or 20% or greater for major osteoporotic fracture. All treatment decisions require clinical judgment and consideration of individual patient factors, including patient preferences, co-morbidities, previous drug use, risk factors not captured in the FRAX model (e.g. falls, vitamin D deficiency, increased bone turnover, interval significant decline in bone density) and possible under - or over-estimation of fracture risk by FRAX. All patients should ensure an adequate intake of dietary calcium (1200 mg/d) and vitamin D (800 IU daily)  unless contraindicated. FOLLOW-UP: People with diagnosed cases of osteoporosis or at high risk for fracture should have regular bone mineral density tests. For patients eligible for Medicare, routine testing is allowed once every 2 years. The testing frequency can be increased to one year for patients who have rapidly progressing disease, those who are receiving or discontinuing medical therapy to restore bone mass, or have additional risk factors. I have reviewed this report, and agree with the above findings. Mark A. Thornton Papas, M.D. [facility] Radiology Electronically Signed   By: Lavonia Dana M.D.   On: 05/13/2016 09:52    Assessment & Plan:   Problem List Items Addressed This Visit    Hypertension    increase metoprolol to 50 mg daily and continue amlodipine 2.5 mg daily       Relevant Medications   metoprolol succinate (TOPROL-XL) 50 MG 24 hr tablet   Hyperlipidemia associated with type 2 diabetes mellitus (HCC)    LDL and triglycerides are at goal on current medications. She has no side effects and liver enzymes are normal. No changes today .  Lab Results  Component Value Date   CHOL 188 11/21/2016   HDL 58 11/21/2016   LDLCALC 101 (H) 11/21/2016   LDLDIRECT 111.0 08/13/2016   TRIG 175 (H) 11/21/2016   CHOLHDL 3.2 11/21/2016   Lab Results  Component Value Date   ALT 18 03/24/2017   AST 18 03/24/2017   ALKPHOS 84 03/24/2017   BILITOT 0.5 03/24/2017             Relevant Medications   metoprolol succinate (TOPROL-XL) 50 MG 24 hr tablet   Diabetes mellitus type 2, diet-controlled ([place]) - Primary    She is following a  low glycemic index diet and her diabetes is diet controlled.   A1c is 6.4 .  She is taking  an aspirin and simvastatin daily   Lab Results  Component Value Date   HGBA1C 6.4 03/24/2017   Lab Results  Component Value Date   MICROALBUR 0.3 11/21/2016           Relevant Orders   Hemoglobin A1c (Completed)   Comprehensive metabolic panel (Completed)     Cervicalgia of occipito-atlanto-axial region    Plain films of cervical spine  Showed only  mild degenerative changes of the mid to lower cervical spine with bone spurring  . No alignment issues.. 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 b12 injections advised,  Intrinsic factor ordered.       Relevant Orders   Intrinsic Factor Antibodies    Other Visit Diagnoses    Numbness and tingling in both hands       Relevant Orders   Vitamin B12 (Completed)   RBC Folate (Completed)   Intrinsic Factor Antibodies   Chronic tension-type headache, intractable       Relevant Medications   metoprolol succinate (TOPROL-XL) 50 MG 24 hr tablet   Other Relevant Orders   MR Brain Wo Contrast     A total of 40 minutes was spent with patient more than half of which was spent in counseling patient on the above mentioned issues , reviewing and explaining recent labs and imaging studies done, and coordination of care.  I have changed Iretha F. Donna's metoprolol succinate. I am also having her maintain her omeprazole, Vitamin D3, aspirin, traMADol, imipramine, Fish Oil, butalbital-acetaminophen-caffeine, ALPRAZolam, amLODipine, simvastatin, furosemide, and warfarin.  Meds ordered this encounter  Medications  . metoprolol succinate (TOPROL-XL) 50 MG 24 hr tablet    Sig: Take 1 tablet (50 mg total) by mouth daily.    Dispense:  90 tablet    Refill:  1    Medications Discontinued During This Encounter  Medication Reason  . metoprolol succinate (TOPROL-XL) 25 MG 24 hr tablet     Follow-up: No Follow-up on file.   Crecencio Mc, MD

## 2017-03-24 NOTE — Patient Instructions (Addendum)
Increase the dose of toprol to 50 mg daily in the evening   And continue 2.5 mg amlodipine in the morning   Ask hubby if you have long pauses between breaths while snoring     Consider a chin strap to help with the snoring   MRI of spine if cervical x rays are abnormal  Ok to continue use of fioricet

## 2017-03-25 DIAGNOSIS — M542 Cervicalgia: Secondary | ICD-10-CM | POA: Insufficient documentation

## 2017-03-25 DIAGNOSIS — E538 Deficiency of other specified B group vitamins: Secondary | ICD-10-CM | POA: Insufficient documentation

## 2017-03-25 LAB — FOLATE RBC: RBC FOLATE: 383 ng/mL (ref >280)

## 2017-03-25 NOTE — Assessment & Plan Note (Signed)
She is following a  low glycemic index diet and her diabetes is diet controlled.   A1c is 6.4 .  She is taking  an aspirin and simvastatin daily   Lab Results  Component Value Date   HGBA1C 6.4 03/24/2017   Lab Results  Component Value Date   MICROALBUR 0.3 11/21/2016

## 2017-03-25 NOTE — Assessment & Plan Note (Signed)
increase metoprolol to 50 mg daily and continue amlodipine 2.5 mg daily

## 2017-03-25 NOTE — Assessment & Plan Note (Addendum)
Plain films of cervical spine  Showed only  mild degenerative changes of the mid to lower cervical spine with bone spurring  . No alignment issues.. Will obtain MRI brain and [facility] to [facility] for evaluation

## 2017-03-25 NOTE — Assessment & Plan Note (Signed)
LDL and triglycerides are at goal on current medications. She has no side effects and liver enzymes are normal. No changes today .  Lab Results  Component Value Date   CHOL 188 11/21/2016   HDL 58 11/21/2016   LDLCALC 101 (H) 11/21/2016   LDLDIRECT 111.0 08/13/2016   TRIG 175 (H) 11/21/2016   CHOLHDL 3.2 11/21/2016   Lab Results  Component Value Date   ALT 18 03/24/2017   AST 18 03/24/2017   ALKPHOS 84 03/24/2017   BILITOT 0.5 03/24/2017

## 2017-03-25 NOTE — Assessment & Plan Note (Signed)
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 b12 injections advised,  Intrinsic factor ordered.

## 2017-03-31 ENCOUNTER — Ambulatory Visit (INDEPENDENT_AMBULATORY_CARE_PROVIDER_SITE_OTHER): Payer: PPO

## 2017-03-31 ENCOUNTER — Ambulatory Visit (INDEPENDENT_AMBULATORY_CARE_PROVIDER_SITE_OTHER): Payer: PPO | Admitting: Internal Medicine

## 2017-03-31 DIAGNOSIS — E538 Deficiency of other specified B group vitamins: Secondary | ICD-10-CM | POA: Diagnosis not present

## 2017-03-31 DIAGNOSIS — R202 Paresthesia of skin: Secondary | ICD-10-CM

## 2017-03-31 DIAGNOSIS — R200 Anesthesia of skin: Secondary | ICD-10-CM | POA: Diagnosis not present

## 2017-03-31 DIAGNOSIS — Z7901 Long term (current) use of anticoagulants: Secondary | ICD-10-CM

## 2017-03-31 LAB — PROTIME-INR
INR: 1.2 ratio — ABNORMAL HIGH (ref 0.8–1.0)
PROTHROMBIN TIME: 13.1 s (ref 9.6–13.1)

## 2017-03-31 MED ORDER — CYANOCOBALAMIN 1000 MCG/ML IJ SOLN
1000.0000 ug | Freq: Once | INTRAMUSCULAR | Status: AC
  Administered 2017-03-31: 1000 ug via INTRAMUSCULAR

## 2017-03-31 MED ORDER — CYANOCOBALAMIN 1000 MCG/ML IJ SOLN: 1000.0000 ug | Freq: Once | INTRAMUSCULAR | Status: AC

## 2017-03-31 NOTE — Progress Notes (Signed)
opended in [place]

## 2017-03-31 NOTE — Progress Notes (Signed)
Patient comes in for 1 st weekly B 12 injection.  Injected left deltoid.  Patient tolerated injection well.

## 2017-04-01 NOTE — Progress Notes (Signed)
  I have reviewed the above information and agree with above.   Dane Kopke, MD 

## 2017-04-02 ENCOUNTER — Ambulatory Visit
Admission: RE | Admit: 2017-04-02 | Discharge: 2017-04-02 | Disposition: A | Discharge status: Home / Self Care | Payer: PPO | Source: Ambulatory Visit | Attending: Internal Medicine | Admitting: Internal Medicine

## 2017-04-02 DIAGNOSIS — R51 Headache: Secondary | ICD-10-CM | POA: Diagnosis not present

## 2017-04-02 DIAGNOSIS — G44221 Chronic tension-type headache, intractable: Secondary | ICD-10-CM | POA: Diagnosis not present

## 2017-04-02 DIAGNOSIS — I6782 Cerebral ischemia: Secondary | ICD-10-CM | POA: Diagnosis not present

## 2017-04-03 ENCOUNTER — Encounter: Payer: Self-pay | Admitting: Internal Medicine

## 2017-04-03 LAB — INTRINSIC FACTOR ANTIBODIES: Intrinsic Factor: POSITIVE — AB

## 2017-04-06 ENCOUNTER — Other Ambulatory Visit: Payer: Self-pay | Admitting: Internal Medicine

## 2017-04-06 DIAGNOSIS — Z1231 Encounter for screening mammogram for malignant neoplasm of breast: Secondary | ICD-10-CM

## 2017-04-06 MED ORDER — ATORVASTATIN CALCIUM 40 MG PO TABS: 40.0000 mg | ORAL_TABLET | Freq: Every day | ORAL | 3 refills | Status: DC

## 2017-04-06 MED ORDER — AMLODIPINE BESYLATE 5 MG PO TABS
5.0000 mg | ORAL_TABLET | Freq: Every day | ORAL | 1 refills | Status: DC
Start: 2017-04-06 — End: 2017-04-08

## 2017-04-06 NOTE — Telephone Encounter (Signed)
amLODipine (NORVASC) 5 MG tablet  atorvastatin (LIPITOR) 40 MG tablet  Both was called into [facility] and she no longer uses them  Please call in to :  [facility] [address], [place]  [place] [address]  Phone: [phone] Fax: [phone]

## 2017-04-07 ENCOUNTER — Telehealth: Payer: Self-pay

## 2017-04-07 ENCOUNTER — Ambulatory Visit (INDEPENDENT_AMBULATORY_CARE_PROVIDER_SITE_OTHER): Payer: PPO

## 2017-04-07 DIAGNOSIS — E538 Deficiency of other specified B group vitamins: Secondary | ICD-10-CM

## 2017-04-07 MED ORDER — CYANOCOBALAMIN 1000 MCG/ML IJ SOLN
1000.0000 ug | Freq: Once | INTRAMUSCULAR | Status: AC
  Administered 2017-04-07: 1000 ug via INTRAMUSCULAR

## 2017-04-07 NOTE — Telephone Encounter (Signed)
Patient was in office today for a b12 injection, while here the pt wanted me to check her blood pressure machine. The reading that we got off of the automatic machine was 156/99 pulse 65. Then I got manually 158/86 pulse 66. Spoke with Dr. Derrel Nip since the pt's blood pressure was elevated and she advised the pt to increase her amlodipine to 10mg  daily. Pt was advised of amlodipine dose change and gave a verbal understanding. Pt was also advised that she should check she blood pressure for about a week and then call us with the readings.

## 2017-04-07 NOTE — Progress Notes (Signed)
Patient presents today for B12 injection. Right deltoid. Patient voiced no concerns nor showed any signs of distress during injection.  

## 2017-04-08 MED ORDER — AMLODIPINE BESYLATE 10 MG PO TABS: 10.0000 mg | ORAL_TABLET | Freq: Every day | ORAL | 1 refills | Status: DC

## 2017-04-08 NOTE — Telephone Encounter (Signed)
Copied from [place] [id number]. Topic: Quick Communication - See Telephone Encounter >> Apr 08, 2017  4:28 PM Percell Belt A wrote: CRM for notification. See Telephone encounter for: 04/08/17. Pt called in and stated per my chart message, meds were going to be changed but she said she picked up the same med.  She would like to speak with nurse about these meds

## 2017-04-08 NOTE — Telephone Encounter (Signed)
  I have reviewed the above information and agree with above.   Teana Lindahl, MD 

## 2017-04-09 ENCOUNTER — Encounter: Payer: Self-pay | Admitting: Internal Medicine

## 2017-04-10 ENCOUNTER — Other Ambulatory Visit: Payer: Self-pay

## 2017-04-10 MED ORDER — ATORVASTATIN CALCIUM 40 MG PO TABS: 40.0000 mg | ORAL_TABLET | Freq: Every day | ORAL | 3 refills | Status: DC

## 2017-04-10 MED ORDER — AMLODIPINE BESYLATE 10 MG PO TABS: 10.0000 mg | ORAL_TABLET | Freq: Every day | ORAL | 1 refills | Status: DC

## 2017-04-10 NOTE — Telephone Encounter (Signed)
Spoke with pt on the phone to let her know that we have resent both correct prescriptions to [facility] on [address]. Pt gave a verbal understanding.

## 2017-04-12 NOTE — Progress Notes (Signed)
  I have reviewed the above information and agree with above.   Saharsh Sterling, MD 

## 2017-04-14 ENCOUNTER — Other Ambulatory Visit: Payer: Self-pay | Admitting: Radiology

## 2017-04-14 ENCOUNTER — Ambulatory Visit (INDEPENDENT_AMBULATORY_CARE_PROVIDER_SITE_OTHER): Payer: PPO | Admitting: *Deleted

## 2017-04-14 ENCOUNTER — Other Ambulatory Visit (INDEPENDENT_AMBULATORY_CARE_PROVIDER_SITE_OTHER): Payer: PPO

## 2017-04-14 DIAGNOSIS — Z7901 Long term (current) use of anticoagulants: Principal | ICD-10-CM

## 2017-04-14 DIAGNOSIS — Z5181 Encounter for therapeutic drug level monitoring: Secondary | ICD-10-CM

## 2017-04-14 DIAGNOSIS — E538 Deficiency of other specified B group vitamins: Secondary | ICD-10-CM | POA: Diagnosis not present

## 2017-04-14 LAB — PROTIME-INR
INR: 1.3 ratio — ABNORMAL HIGH (ref 0.8–1.0)
PROTHROMBIN TIME: 14.5 s — AB (ref 9.6–13.1)

## 2017-04-14 MED ORDER — CYANOCOBALAMIN 1000 MCG/ML IJ SOLN
1000.0000 ug | Freq: Once | INTRAMUSCULAR | Status: AC
  Administered 2017-04-14: 1000 ug via INTRAMUSCULAR

## 2017-04-14 NOTE — Progress Notes (Signed)
Patient presented for B 12 injection to right deltoid, patient voiced no concerns nor showed any signs of distress during injection.  Patient would like to continue B 12 injection s but at home patient stated her daughter is an Therapist, sports and ask if she could give injections at home since she has completed the weekly injections? Patient also brought in a list of BP reading for PCP placed in yellow folder.

## 2017-04-15 ENCOUNTER — Encounter: Payer: Self-pay | Admitting: Internal Medicine

## 2017-04-15 ENCOUNTER — Other Ambulatory Visit: Payer: Self-pay | Admitting: Internal Medicine

## 2017-04-15 DIAGNOSIS — E538 Deficiency of other specified B group vitamins: Secondary | ICD-10-CM

## 2017-04-15 DIAGNOSIS — Z7901 Long term (current) use of anticoagulants: Secondary | ICD-10-CM

## 2017-04-15 MED ORDER — CYANOCOBALAMIN 1000 MCG/ML IJ SOLN: 1000.0000 ug | INTRAMUSCULAR | 2 refills | Status: DC

## 2017-04-15 MED ORDER — WARFARIN SODIUM 6 MG PO TABS: 6.0000 mg | ORAL_TABLET | Freq: Every day | ORAL | 1 refills | Status: DC

## 2017-04-15 MED ORDER — "SYRINGE 25G X 1"" 3 ML MISC": 0 refills | Status: DC

## 2017-04-15 NOTE — Assessment & Plan Note (Signed)
INR still low on 5 mg daily.  10 mg dose tonight,  Then 6 mg daily   ,  Repeat iNR 2 weeks

## 2017-04-16 ENCOUNTER — Encounter: Payer: Self-pay | Admitting: Internal Medicine

## 2017-04-22 ENCOUNTER — Ambulatory Visit
Admission: RE | Admit: 2017-04-22 | Discharge: 2017-04-22 | Disposition: A | Discharge status: Home / Self Care | Payer: PPO | Source: Ambulatory Visit | Attending: Internal Medicine | Admitting: Internal Medicine

## 2017-04-22 DIAGNOSIS — Z1231 Encounter for screening mammogram for malignant neoplasm of breast: Secondary | ICD-10-CM | POA: Diagnosis not present

## 2017-04-24 ENCOUNTER — Other Ambulatory Visit: Payer: Self-pay | Admitting: Internal Medicine

## 2017-05-01 ENCOUNTER — Ambulatory Visit (INDEPENDENT_AMBULATORY_CARE_PROVIDER_SITE_OTHER): Payer: PPO | Admitting: Family Medicine

## 2017-05-01 DIAGNOSIS — Z7901 Long term (current) use of anticoagulants: Secondary | ICD-10-CM

## 2017-05-01 LAB — PROTIME-INR
INR: 2.9 ratio — ABNORMAL HIGH (ref 0.8–1.0)
Prothrombin Time: 33.1 s — ABNORMAL HIGH (ref 9.6–13.1)

## 2017-05-04 ENCOUNTER — Telehealth: Payer: Self-pay | Admitting: Radiology

## 2017-05-04 NOTE — Telephone Encounter (Signed)
Pt came in on Friday for labs. Pt was scheduled under OV and had to be changed to a lab visit. Could you please close pt encounter? Thank you.

## 2017-05-04 NOTE — Telephone Encounter (Signed)
She is not on my schedule to close

## 2017-05-04 NOTE — Progress Notes (Signed)
Opened in error

## 2017-05-05 ENCOUNTER — Telehealth: Payer: Self-pay | Admitting: Internal Medicine

## 2017-05-05 NOTE — Telephone Encounter (Signed)
Handles  Not sure why it want to you

## 2017-05-05 NOTE — Telephone Encounter (Signed)
Copied from [place] [id number]. Topic: Quick Communication - Lab Results >> May 05, 2017 11:10 AM Robina Ade, Helene Kelp D wrote: Patient would like a call back with her lab results of 05/01/17. Please call patient back.

## 2017-05-05 NOTE — Telephone Encounter (Signed)
Please advise 

## 2017-05-05 NOTE — Telephone Encounter (Signed)
It appears this is a Dr Derrel Nip patient. I will forward to her to review as I did not order the previous lab work and have not seen the patient previously.

## 2017-05-14 ENCOUNTER — Other Ambulatory Visit: Payer: Self-pay

## 2017-05-14 ENCOUNTER — Emergency Department
Admission: EM | Admit: 2017-05-14 | Discharge: 2017-05-14 | Disposition: A | Discharge status: Home / Self Care | Payer: PPO | Attending: Emergency Medicine | Admitting: Emergency Medicine

## 2017-05-14 ENCOUNTER — Encounter: Payer: Self-pay | Admitting: Emergency Medicine

## 2017-05-14 ENCOUNTER — Telehealth: Payer: Self-pay

## 2017-05-14 ENCOUNTER — Ambulatory Visit: Payer: PPO | Admitting: Family Medicine

## 2017-05-14 ENCOUNTER — Emergency Department: Payer: PPO

## 2017-05-14 DIAGNOSIS — Z7901 Long term (current) use of anticoagulants: Secondary | ICD-10-CM | POA: Diagnosis not present

## 2017-05-14 DIAGNOSIS — R2242 Localized swelling, mass and lump, left lower limb: Secondary | ICD-10-CM | POA: Insufficient documentation

## 2017-05-14 DIAGNOSIS — Z7982 Long term (current) use of aspirin: Secondary | ICD-10-CM | POA: Insufficient documentation

## 2017-05-14 DIAGNOSIS — I10 Essential (primary) hypertension: Secondary | ICD-10-CM | POA: Insufficient documentation

## 2017-05-14 DIAGNOSIS — M7122 Synovial cyst of popliteal space [Baker], left knee: Secondary | ICD-10-CM | POA: Diagnosis not present

## 2017-05-14 DIAGNOSIS — R600 Localized edema: Secondary | ICD-10-CM | POA: Diagnosis not present

## 2017-05-14 DIAGNOSIS — E119 Type 2 diabetes mellitus without complications: Secondary | ICD-10-CM | POA: Diagnosis not present

## 2017-05-14 DIAGNOSIS — Z79899 Other long term (current) drug therapy: Secondary | ICD-10-CM | POA: Insufficient documentation

## 2017-05-14 DIAGNOSIS — M7989 Other specified soft tissue disorders: Secondary | ICD-10-CM

## 2017-05-14 DIAGNOSIS — M79662 Pain in left lower leg: Secondary | ICD-10-CM

## 2017-05-14 LAB — CBC
HCT: 40.7 % (ref 35.0–47.0)
HEMOGLOBIN: 13.8 g/dL (ref 12.0–16.0)
MCH: 29.1 pg (ref 26.0–34.0)
MCHC: 33.8 g/dL (ref 32.0–36.0)
MCV: 86.1 fL (ref 80.0–100.0)
Platelets: 174 10*3/uL (ref 150–440)
RBC: 4.73 MIL/uL (ref 3.80–5.20)
RDW: 14.5 % (ref 11.5–14.5)
WBC: 5.1 10*3/uL (ref 3.6–11.0)

## 2017-05-14 LAB — BASIC METABOLIC PANEL
ANION GAP: 11 (ref 5–15)
BUN: 20 mg/dL (ref 6–20)
CO2: 21 mmol/L — ABNORMAL LOW (ref 22–32)
Calcium: 8.7 mg/dL — ABNORMAL LOW (ref 8.9–10.3)
Chloride: 107 mmol/L (ref 101–111)
Creatinine, Ser: 0.74 mg/dL (ref 0.44–1.00)
GFR calc Af Amer: >60 mL/min (ref >60)
Glucose, Bld: 116 mg/dL — ABNORMAL HIGH (ref 65–99)
Potassium: 4.2 mmol/L (ref 3.5–5.1)
SODIUM: 139 mmol/L (ref 135–145)

## 2017-05-14 LAB — PROTIME-INR
INR: 2.58
Prothrombin Time: 27.5 seconds — ABNORMAL HIGH (ref 11.4–15.2)

## 2017-05-14 NOTE — ED Triage Notes (Signed)
Pt to ED via POV with c/o LFT leg swelling x2wks. 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 Pt takes warfrin daily. Swelling noted, no redness or warmth noted. VSS. Pt ambulatory

## 2017-05-14 NOTE — ED Notes (Signed)
Taken to US.

## 2017-05-14 NOTE — ED Provider Notes (Signed)
[facility] Emergency Department Provider Note  ____________________________________________  Time seen: Approximately 3:58 PM  I have reviewed the triage vital signs and the nursing notes.   HISTORY  Chief Complaint Leg Swelling    HPI [name] is a 67 y.o. female with a history of hypertension, hyperlipidemia, diabetes and hypercoagulable state who complains of left leg swelling for the past week. Denies pain. No chest pain shortness of breath fever or chills. No recent trauma surgery or long travel.swelling is worse when standing upright, better with her legs elevated. Constant issue for the past week. Mild to moderate in severity.  She called her doctor who instructed her to come get an ultrasound due to her medical history. The patient does note that 2 weeks ago she had an increase of her blood pressure medicines from 25 mg metoprolol and 2.5 mg amlodipine to 50 not her metoprolol and 10 mg amlodipine. She wonders if this is related. She is compliant with warfarin therapy.  patient notes that she even has some swelling in her left wrist.    Past Medical History:  Diagnosis Date  . Diabetes mellitus without complication ([place])   . H/O toxic shock syndrome 2001   post urologic procedure for stone removal  . 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 Hyperlipidemia   . Hypertension   . Ischemic colitis ([place]) 2012-2013   s/p left colectomy May 2013  . Kidney stones      Patient Active Problem List   Diagnosis Date Noted  . Cervicalgia of occipito-atlanto-axial region 03/25/2017  . B12 deficiency 03/25/2017  . Caregiver with fatigue 11/22/2016  . Osteoporosis 05/15/2016  . Baker's cyst of knee, left 12/11/2015  . Pain and swelling of lower leg, left 12/10/2015  . Hyperlipidemia associated with type 2 diabetes mellitus ([place]) 09/03/2015  . Diabetes mellitus type 2, diet-controlled ([place])  12/08/2014  . S/P insertion of IVC (inferior vena caval) filter 11/05/2013  . Encounter for preventive health examination 11/05/2013  . History of meniscal tear 11/05/2013  . History of shingles 11/05/2013  . Vitamin D deficiency 11/05/2013  . S/P hysterectomy 11/02/2013  . Overweight (BMI 25.0-29.9) 10/28/2012  . Hyperlipidemia LDL goal <100 10/28/2012  . 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 Long term current use of anticoagulant therapy 07/20/2011  . Chronic ischemic colitis, enteritis, or enterocolitis ([place]) 04/08/2011  . Hypertension   . H/O toxic shock syndrome   . History of pulmonary embolus (PE)      Past Surgical History:  Procedure Laterality Date  . ABDOMINAL HYSTERECTOMY  1996  . APPENDECTOMY    . bilateral tubal ligation    . BREAST EXCISIONAL BIOPSY Left 1989   benign  . BREAST SURGERY  1989   left biopsy, normal  . COLECTOMY Left May 2013   [place], ischemic colitis  . COLONOSCOPY WITH PROPOFOL N/A 11/16/2014   Procedure: COLONOSCOPY WITH PROPOFOL;  Surgeon: Manya Silvas, MD;  Location: [facility] ENDOSCOPY;  Service: Endoscopy;  Laterality: N/A;  . ESOPHAGOGASTRODUODENOSCOPY (EGD) WITH PROPOFOL N/A 11/16/2014   Procedure: ESOPHAGOGASTRODUODENOSCOPY (EGD) WITH PROPOFOL;  Surgeon: Manya Silvas, MD;  Location: [facility] ENDOSCOPY;  Service: Endoscopy;  Laterality: N/A;  . SPINE SURGERY  1984   lumbar diskectomy, [place]  . ureterolithotomy       Prior to Admission medications   Medication Sig Start Date End Date Taking? Authorizing Provider  ALPRAZolam Duanne Moron) 0.25 MG tablet Take  1 tablet (0.25 mg total) at bedtime as needed by mouth for anxiety. 11/21/16   Crecencio Mc, MD  amLODipine (NORVASC) 10 MG tablet Take 1 tablet (10 mg total) by mouth daily. 04/10/17   Crecencio Mc, MD  aspirin 81 MG tablet Take 81 mg by mouth daily.    [provider]  atorvastatin (LIPITOR) 40 MG tablet Take 1 tablet (40 mg total) by mouth daily. 04/10/17   Crecencio Mc,  MD  butalbital-acetaminophen-caffeine (FIORICET, ESGIC) [id number] MG tablet TAKE ONE TABLET BY MOUTH EVERY 6 HOURS AS NEEDED FOR HEADACHE 11/21/16   Crecencio Mc, MD  Cholecalciferol (VITAMIN D3) 1000 UNITS CAPS Take by mouth.    [provider]  cyanocobalamin (,VITAMIN B-12,) 1000 MCG/ML injection Inject 1 mL (1,000 mcg total) into the muscle every 30 (thirty) days. 04/15/17   Crecencio Mc, MD  furosemide (LASIX) 20 MG tablet TAKE 1 TABLET BY MOUTH ONCE DAILY 02/17/17   Crecencio Mc, MD  imipramine (TOFRANIL) 25 MG tablet TAKE 1 TABLET BY MOUTH AT BEDTIME 04/27/17   Crecencio Mc, MD  metoprolol succinate (TOPROL-XL) 50 MG 24 hr tablet Take 1 tablet (50 mg total) by mouth daily. 03/24/17   Crecencio Mc, MD  Omega-3 Fatty Acids (FISH OIL) 1000 MG CAPS Take 1 capsule daily by mouth.    [provider]  omeprazole (PRILOSEC) 20 MG capsule Take 20 mg by mouth daily.    [provider]  Syringe/Needle, Disp, (SYRINGE 3CC/25GX1") 25G X 1" 3 ML MISC Use for b12 injections 04/15/17   Crecencio Mc, MD  traMADol (ULTRAM) 50 MG tablet Take 1 tablet (50 mg total) by mouth every 8 (eight) hours as needed. 05/31/15   Crecencio Mc, MD  warfarin (COUMADIN) 6 MG tablet Take 1 tablet (6 mg total) by mouth daily. 04/15/17   Crecencio Mc, MD     Allergies Patient has no known allergies.   Family History  Problem Relation Age of Onset  . Hypertension Mother   . Cancer Mother   . Diabetes Mother   . Heart disease Father   . Hypertension Father   . Diabetes Father   . Breast cancer Paternal Aunt        <50  . Breast cancer Cousin   . Alzheimer's disease Sister   . Breast cancer Sister 4       DCIS    Social History Social History   Tobacco Use  . Smoking status: Never Smoker  . Smokeless tobacco: Never Used  Substance Use Topics  . Alcohol use: No  . Drug use: No    Review of Systems  Constitutional:   No fever or chills.  ENT:   No sore throat.  No rhinorrhea. Cardiovascular:   No chest pain or syncope. Respiratory:   No dyspnea or cough. Gastrointestinal:   Negative for abdominal pain, vomiting and diarrhea.  Musculoskeletal:   left leg swelling as above without pain. All other systems reviewed and are negative except as documented above in ROS and HPI.  ____________________________________________   PHYSICAL EXAM:  VITAL SIGNS: ED Triage Vitals [05/14/17 1151]  Enc Vitals Group     BP 130/87     Pulse Rate 67     Resp 16     Temp 98.8 F (37.1 C)     Temp Source Oral     SpO2 98 %     Weight 160 lb (72.6 kg)  Height 5\' 1"  (1.549 m)     Head Circumference      Peak Flow      Pain Score 2     Pain Loc      Pain Edu?      Excl. in [place]?     Vital signs reviewed, nursing assessments reviewed.   Constitutional:   Alert and oriented. Well appearing and in no distress. Eyes:   Conjunctivae are normal. EOMI. PERRL. ENT      Head:   Normocephalic and atraumatic.      Nose:   No congestion/rhinnorhea.       Mouth/Throat:   MMM, no pharyngeal erythema. No peritonsillar mass.       Neck:   No meningismus. Full ROM. Hematological/Lymphatic/Immunilogical:   No cervical lymphadenopathy. Cardiovascular:   RRR. Symmetric bilateral radial and DP pulses.  No murmurs.  Respiratory:   Normal respiratory effort without tachypnea/retractions. Breath sounds are clear and equal bilaterally. No wheezes/rales/rhonchi. Gastrointestinal:   Soft and nontender. Non distended. There is no CVA tenderness.  No rebound, rigidity, or guarding. Genitourinary:   deferred Musculoskeletal:   Normal range of motion in all extremities. No joint effusions.  No lower extremity tenderness. no calf tenderness. Negative Homans sign. Calf circumference on the left is 1 cm greater than the right. Neurologic:   Normal speech and language.  Motor grossly intact. No acute focal neurologic deficits are appreciated.  Skin:    Skin is warm, dry and intact.  No rash noted.  No petechiae, purpura, or bullae.  ____________________________________________    LABS (pertinent positives/negatives) (all labs ordered are listed, but only abnormal results are displayed) Labs Reviewed  BASIC METABOLIC PANEL - Abnormal; Notable for the following components:      Result Value   CO2 21 (*)    Glucose, Bld 116 (*)    Calcium 8.7 (*)    All other components within normal limits  PROTIME-INR - Abnormal; Notable for the following components:   Prothrombin Time 27.5 (*)    All other components within normal limits  CBC   ____________________________________________   EKG    ____________________________________________    RADIOLOGY  US Venous Img Lower Unilateral Left  Result Date: 05/14/2017 CLINICAL DATA:  Left lower extremity pain and edema. History of pulmonary embolism. Patient is currently on anticoagulation. Evaluate for DVT. EXAM: LEFT LOWER EXTREMITY VENOUS DOPPLER ULTRASOUND TECHNIQUE: Gray-scale sonography with graded compression, as well as color Doppler and duplex ultrasound were performed to evaluate the lower extremity deep venous systems from the level of the common femoral vein and including the common femoral, femoral, profunda femoral, popliteal and calf veins including the posterior tibial, peroneal and gastrocnemius veins when visible. The superficial great saphenous vein was also interrogated. Spectral Doppler was utilized to evaluate flow at rest and with distal augmentation maneuvers in the common femoral, femoral and popliteal veins. COMPARISON:  Left lower extremity venous Doppler ultrasound - 04/13/2015 FINDINGS: Contralateral Common Femoral Vein: Respiratory phasicity is normal and symmetric with the symptomatic side. No evidence of thrombus. Normal compressibility. Common Femoral Vein: No evidence of thrombus. Normal compressibility, respiratory phasicity and response to augmentation. Saphenofemoral Junction: No evidence of  thrombus. Normal compressibility and flow on color Doppler imaging. Profunda Femoral Vein: No evidence of thrombus. Normal compressibility and flow on color Doppler imaging. Femoral Vein: No evidence of thrombus. Normal compressibility, respiratory phasicity and response to augmentation. Popliteal Vein: No evidence of thrombus. Normal compressibility, respiratory phasicity and response to augmentation. Calf Veins:  No evidence of thrombus. Normal compressibility and flow on color Doppler imaging. Superficial Great Saphenous Vein: No evidence of thrombus. Normal compressibility. Venous Reflux:  None. Other Findings: Note is again made of an approximately 6.6 x 1.7 x 3.0 cm fluid collection with the left popliteal fossa compatible with a Baker cyst, unchanged to slightly decreased in size compared to the 12/2015 examination, previously, 7.1 x 2.4 x 3.8 cm IMPRESSION: 1. No evidence of DVT within the left lower extremity. 2. 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 Electronically Signed   By: Sandi Mariscal M.D.   On: 05/14/2017 13:50    ____________________________________________   PROCEDURES Procedures  ____________________________________________  DIFFERENTIAL DIAGNOSIS   leaking Baker's cyst, DVT, amlodipine side effect. Doubt heart failure cellulitis and osteomyelitis abscess or necrotizing fasciitis.  CLINICAL IMPRESSION / ASSESSMENT AND PLAN / ED COURSE  Pertinent labs & imaging results that were available during my care of the patient were reviewed by me and considered in my medical decision making (see chart for details).    patient well-appearing no acute distress, overall reassuring exam, low suspicion for DVT but with her history an ultrasound was obtained. This ultrasound was negative for DVT but does confirm the Baker cyst that she has had previously. It is slightly smaller than before. Vital signs are normal, no evidence  of PE. She is compliant with her Coumadin, INR is within target range. Based on history highly suspect that this is related to medication side effect, amlodipine being the most likely culprit. Counseled the patient to continue taking her medications as prescribed for now given the adequate control of her blood pressure that was severely elevated in the 180/110 range before, follow-up with her doctor to reassess whether the side effects are tolerable.      ____________________________________________   FINAL CLINICAL IMPRESSION(S) / ED DIAGNOSES    Final diagnoses:  Leg swelling  Baker cyst, left     ED Discharge Orders    None      Portions of this note were generated with dragon dictation software. Dictation errors may occur despite best attempts at proofreading.    Carrie Mew, MD 05/14/17 [phone]

## 2017-05-14 NOTE — ED Notes (Signed)
Spoke with MD Jimmye Norman, verbal orders given to blood work and [place] at this time

## 2017-05-14 NOTE — ED Notes (Signed)
PT verbalized understanding of d/c teaching and follow up,. Pt in NAD, VSS. PT signed paperform of esignature

## 2017-05-14 NOTE — Telephone Encounter (Signed)
Copied from [place] [id number]. Topic: Inquiry >> May 14, 2017 10:49 AM Scherrie Gerlach wrote: Reason for CRM: pt states she got another call from the office, not sure if you needed to speak with her again. Pt is getting ready to go to the ED, but will have her cell phone if you need to call

## 2017-05-14 NOTE — Telephone Encounter (Signed)
Patient appointment was canceled and sent to ED due to patient sx of dvt/ swelling in extremities, left leg is worse. Also having history of PE and taking anticoagulants.

## 2017-05-14 NOTE — ED Triage Notes (Signed)
FIRST NURSE NOTE-here to r/o blood clot left leg. Unlabored. Ambulatory.

## 2017-05-14 NOTE — Discharge Instructions (Addendum)
Results for orders placed or performed during the hospital encounter of 29/93/71  Basic metabolic panel  Result Value Ref Range   Sodium 139 135 - 145 mmol/L   Potassium 4.2 3.5 - 5.1 mmol/L   Chloride 107 101 - 111 mmol/L   CO2 21 (L) 22 - 32 mmol/L   Glucose, Bld 116 (H) 65 - 99 mg/dL   BUN 20 6 - 20 mg/dL   Creatinine, Ser 0.74 0.44 - 1.00 mg/dL   Calcium 8.7 (L) 8.9 - 10.3 mg/dL   GFR calc non Af Amer >60 >60 mL/min   GFR calc Af Amer >60 >60 mL/min   Anion gap 11 5 - 15  CBC  Result Value Ref Range   WBC 5.1 3.6 - 11.0 K/uL   RBC 4.73 3.80 - 5.20 MIL/uL   Hemoglobin 13.8 12.0 - 16.0 g/dL   HCT 40.7 35.0 - 47.0 %   MCV 86.1 80.0 - 100.0 fL   MCH 29.1 26.0 - 34.0 pg   MCHC 33.8 32.0 - 36.0 g/dL   RDW 14.5 11.5 - 14.5 %   Platelets 174 150 - 440 K/uL  Protime-INR  Result Value Ref Range   Prothrombin Time 27.5 (H) 11.4 - 15.2 seconds   INR 2.58    US Venous Img Lower Unilateral Left  Result Date: 05/14/2017 CLINICAL DATA:  Left lower extremity pain and edema. History of pulmonary embolism. Patient is currently on anticoagulation. Evaluate for DVT. EXAM: LEFT LOWER EXTREMITY VENOUS DOPPLER ULTRASOUND TECHNIQUE: Gray-scale sonography with graded compression, as well as color Doppler and duplex ultrasound were performed to evaluate the lower extremity deep venous systems from the level of the common femoral vein and including the common femoral, femoral, profunda femoral, popliteal and calf veins including the posterior tibial, peroneal and gastrocnemius veins when visible. The superficial great saphenous vein was also interrogated. Spectral Doppler was utilized to evaluate flow at rest and with distal augmentation maneuvers in the common femoral, femoral and popliteal veins. COMPARISON:  Left lower extremity venous Doppler ultrasound - 04/13/2015 FINDINGS: Contralateral Common Femoral Vein: Respiratory phasicity is normal and symmetric with the symptomatic side. No evidence of  thrombus. Normal compressibility. Common Femoral Vein: No evidence of thrombus. Normal compressibility, respiratory phasicity and response to augmentation. Saphenofemoral Junction: No evidence of thrombus. Normal compressibility and flow on color Doppler imaging. Profunda Femoral Vein: No evidence of thrombus. Normal compressibility and flow on color Doppler imaging. Femoral Vein: No evidence of thrombus. Normal compressibility, respiratory phasicity and response to augmentation. Popliteal Vein: No evidence of thrombus. Normal compressibility, respiratory phasicity and response to augmentation. Calf Veins: No evidence of thrombus. Normal compressibility and flow on color Doppler imaging. Superficial Great Saphenous Vein: No evidence of thrombus. Normal compressibility. Venous Reflux:  None. Other Findings: Note is again made of an approximately 6.6 x 1.7 x 3.0 cm fluid collection with the left popliteal fossa compatible with a Baker cyst, unchanged to slightly decreased in size compared to the 12/2015 examination, previously, 7.1 x 2.4 x 3.8 cm IMPRESSION: 1. No evidence of DVT within the left lower extremity. 2. 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 Electronically Signed   By: Sandi Mariscal M.D.   On: 05/14/2017 13:50   Mm Screening Breast Tomo Bilateral  Result Date: 04/22/2017 CLINICAL DATA:  Screening. EXAM: DIGITAL SCREENING BILATERAL MAMMOGRAM WITH TOMO AND CAD COMPARISON:  Previous exam(s). ACR Breast Density Category b: There are scattered areas of  fibroglandular density. FINDINGS: There are no findings suspicious for malignancy. Images were processed with CAD. IMPRESSION: No mammographic evidence of malignancy. A result letter of this screening mammogram will be mailed directly to the patient. RECOMMENDATION: Screening mammogram in one year. (Code:SM-B-01Y) BI-RADS CATEGORY  1: Negative. Electronically Signed   By: Ammie Ferrier  M.D.   On: 04/22/2017 13:45

## 2017-05-20 ENCOUNTER — Encounter: Payer: Self-pay | Admitting: Internal Medicine

## 2017-05-20 ENCOUNTER — Ambulatory Visit (INDEPENDENT_AMBULATORY_CARE_PROVIDER_SITE_OTHER): Payer: PPO | Admitting: Internal Medicine

## 2017-05-20 ENCOUNTER — Encounter

## 2017-05-20 DIAGNOSIS — R601 Generalized edema: Secondary | ICD-10-CM

## 2017-05-20 DIAGNOSIS — I10 Essential (primary) hypertension: Secondary | ICD-10-CM | POA: Diagnosis not present

## 2017-05-20 MED ORDER — TELMISARTAN 40 MG PO TABS: 40.0000 mg | ORAL_TABLET | Freq: Every day | ORAL | 0 refills | Status: DC

## 2017-05-20 NOTE — Patient Instructions (Addendum)
Stop the amlodipine  continue metoprolol at the 50 mg dose for one week  Start telmisartan  one daily  In the morning,  .  After one week reduce metorpolol to 25 mg daily.  If If BP is not 130/80 or less, after one week,  Increase the telmisartan  dose to 80 mg daily

## 2017-05-20 NOTE — Progress Notes (Signed)
Subjective:  Patient ID: [name], female    DOB: [birth year]  Age: 67 y.o. MRN: [id number]  CC: Diagnoses of Generalized edema and Essential hypertension were pertinent to this visit.  HPI [name] presents for ER follow up for bilateral leg swelling.  DVT in both legs were  ruled out   Medications had recently been chagend and amlodipine dose has been  Gradually increased to 10 mg' She has also been experiencing increased fatigue sine  increaseing the metoprolol dose to 50 mg  She has a history of recurrent headaches; the metoprolol was chosen for its dual purpose     Outpatient Medications Prior to Visit  Medication Sig Dispense Refill  . ALPRAZolam (XANAX) 0.25 MG tablet Take 1 tablet (0.25 mg total) at bedtime as needed by mouth for anxiety. 30 tablet 3  . amLODipine (NORVASC) 10 MG tablet Take 1 tablet (10 mg total) by mouth daily. 90 tablet 1  . aspirin 81 MG tablet Take 81 mg by mouth daily.    Marland Kitchen atorvastatin (LIPITOR) 40 MG tablet Take 1 tablet (40 mg total) by mouth daily. 90 tablet 3  . butalbital-acetaminophen-caffeine (FIORICET, ESGIC) 50-325-40 MG tablet TAKE ONE TABLET BY MOUTH EVERY 6 HOURS AS NEEDED FOR HEADACHE 90 tablet 2  . Cholecalciferol (VITAMIN D3) 1000 UNITS CAPS Take by mouth.    . cyanocobalamin (,VITAMIN B-12,) 1000 MCG/ML injection Inject 1 mL (1,000 mcg total) into the muscle every 30 (thirty) days. 10 mL 2  . furosemide (LASIX) 20 MG tablet TAKE 1 TABLET BY MOUTH ONCE DAILY 90 tablet 0  . imipramine (TOFRANIL) 25 MG tablet TAKE 1 TABLET BY MOUTH AT BEDTIME 90 tablet 1  . metoprolol succinate (TOPROL-XL) 50 MG 24 hr tablet Take 1 tablet (50 mg total) by mouth daily. 90 tablet 1  . Omega-3 Fatty Acids (FISH OIL) 1000 MG CAPS Take 1 capsule daily by mouth.    Marland Kitchen omeprazole (PRILOSEC) 20 MG capsule Take 20 mg by mouth daily.    . Syringe/Needle, Disp, (SYRINGE 3CC/25GX1") 25G X 1" 3 ML MISC Use for b12 injections 50 each 0  . traMADol (ULTRAM) 50 MG  tablet Take 1 tablet (50 mg total) by mouth every 8 (eight) hours as needed. 90 tablet 4  . warfarin (COUMADIN) 6 MG tablet Take 1 tablet (6 mg total) by mouth daily. 90 tablet 1   Facility-Administered Medications Prior to Visit  Medication Dose Route Frequency Provider Last Rate Last Dose  . cyanocobalamin ((VITAMIN B-12)) injection 1,000 mcg  1,000 mcg Intramuscular Once Crecencio Mc, MD        Review of Systems;  Patient denies headache, fevers, malaise, unintentional weight loss, skin rash, eye pain, sinus congestion and sinus pain, sore throat, dysphagia,  hemoptysis , cough, dyspnea, wheezing, chest pain, palpitations, orthopnea, edema, abdominal pain, nausea, melena, diarrhea, constipation, flank pain, dysuria, hematuria, urinary  Frequency, nocturia, numbness, tingling, seizures,  Focal weakness, Loss of consciousness,  Tremor, insomnia, depression, anxiety, and suicidal ideation.      Objective:  BP 118/64 (BP Location: Left Arm, Patient Position: Sitting, Cuff Size: Normal)   Pulse 72   Temp 98.4 F (36.9 C) (Oral)   Resp 14   Ht 5\' 1"  (1.549 m)   Wt 164 lb (74.4 kg)   SpO2 97%   BMI 30.99 kg/m   BP Readings from Last 3 Encounters:  05/20/17 118/64  05/14/17 (!) 145/68  03/24/17 134/82    Wt Readings from Last 3  Encounters:  05/20/17 164 lb (74.4 kg)  05/14/17 160 lb (72.6 kg)  03/24/17 162 lb 9.6 oz (73.8 kg)    General appearance: alert, cooperative and appears stated age Ears: normal TM's and external ear canals both ears Throat: lips, mucosa, and tongue normal; teeth and gums normal Neck: no adenopathy, no carotid bruit, supple, symmetrical, trachea midline and thyroid not enlarged, symmetric, no tenderness/mass/nodules Back: symmetric, no curvature. ROM normal. No CVA tenderness. Lungs: clear to auscultation bilaterally Heart: regular rate and rhythm, S1, S2 normal, no murmur, click, rub or gallop Abdomen: soft, non-tender; bowel sounds normal; no  masses,  no organomegaly Pulses: 2+ and symmetric Skin: Skin color, texture, turgor normal. No rashes or lesions Lymph nodes: Cervical, supraclavicular, and axillary nodes normal.  Lab Results  Component Value Date   HGBA1C 6.4 03/24/2017   HGBA1C 5.9 (H) 11/21/2016   HGBA1C 6.3 08/13/2016    Lab Results  Component Value Date   CREATININE 0.74 05/14/2017   CREATININE 1.02 03/24/2017   CREATININE 0.86 11/21/2016    Lab Results  Component Value Date   WBC 5.1 05/14/2017   HGB 13.8 05/14/2017   HCT 40.7 05/14/2017   PLT 174 05/14/2017   GLUCOSE 116 (H) 05/14/2017   CHOL 188 11/21/2016   TRIG 175 (H) 11/21/2016   HDL 58 11/21/2016   LDLDIRECT 111.0 08/13/2016   LDLCALC 101 (H) 11/21/2016   ALT 18 03/24/2017   AST 18 03/24/2017   NA 139 05/14/2017   K 4.2 05/14/2017   CL 107 05/14/2017   CREATININE 0.74 05/14/2017   BUN 20 05/14/2017   CO2 21 (L) 05/14/2017   TSH 2.62 11/18/2016   INR 2.58 05/14/2017   HGBA1C 6.4 03/24/2017   MICROALBUR 0.3 11/21/2016    US Venous Img Lower Unilateral Left  Result Date: 05/14/2017 CLINICAL DATA:  Left lower extremity pain and edema. History of pulmonary embolism. Patient is currently on anticoagulation. Evaluate for DVT. EXAM: LEFT LOWER EXTREMITY VENOUS DOPPLER ULTRASOUND TECHNIQUE: Gray-scale sonography with graded compression, as well as color Doppler and duplex ultrasound were performed to evaluate the lower extremity deep venous systems from the level of the common femoral vein and including the common femoral, femoral, profunda femoral, popliteal and calf veins including the posterior tibial, peroneal and gastrocnemius veins when visible. The superficial great saphenous vein was also interrogated. Spectral Doppler was utilized to evaluate flow at rest and with distal augmentation maneuvers in the common femoral, femoral and popliteal veins. COMPARISON:  Left lower extremity venous Doppler ultrasound - 04/13/2015 FINDINGS: Contralateral  Common Femoral Vein: Respiratory phasicity is normal and symmetric with the symptomatic side. No evidence of thrombus. Normal compressibility. Common Femoral Vein: No evidence of thrombus. Normal compressibility, respiratory phasicity and response to augmentation. Saphenofemoral Junction: No evidence of thrombus. Normal compressibility and flow on color Doppler imaging. Profunda Femoral Vein: No evidence of thrombus. Normal compressibility and flow on color Doppler imaging. Femoral Vein: No evidence of thrombus. Normal compressibility, respiratory phasicity and response to augmentation. Popliteal Vein: No evidence of thrombus. Normal compressibility, respiratory phasicity and response to augmentation. Calf Veins: No evidence of thrombus. Normal compressibility and flow on color Doppler imaging. Superficial Great Saphenous Vein: No evidence of thrombus. Normal compressibility. Venous Reflux:  None. Other Findings: Note is again made of an approximately 6.6 x 1.7 x 3.0 cm fluid collection with the left popliteal fossa compatible with a Baker cyst, unchanged to slightly decreased in size compared to the 12/2015 examination, previously, 7.1 x 2.4 x  3.8 cm IMPRESSION: 1. No evidence of DVT within the left lower extremity. 2. 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 Electronically Signed   By: Sandi Mariscal M.D.   On: 05/14/2017 13:50    Assessment & Plan:   Problem List Items Addressed This Visit    Hypertension    Stopping amlodipine due to increased edema Starting telmisartan at 40 mg dose  . Will wean metoprolol to off  Over 2 weeks and increase telmisartan to 80 mg daily if needed. rtc one week for bmet       Relevant Medications   telmisartan (MICARDIS) 40 MG tablet   Edema    Secondary to use of amlodipine.  Stopping today.   Use of laix 20 mg daily until edema resolves         A total of 25 minutes of face to face time was spent  with patient more than half of which was spent in counselling about the above mentioned conditions, reviewing ER records.    and coordination of care  I am having Alanee F. Gertner start on telmisartan. I am also having her maintain her omeprazole, Vitamin D3, aspirin, traMADol, Fish Oil, butalbital-acetaminophen-caffeine, ALPRAZolam, furosemide, metoprolol succinate, atorvastatin, amLODipine, warfarin, cyanocobalamin, SYRINGE 3CC/25GX1", and imipramine. We will continue to administer cyanocobalamin.  Meds ordered this encounter  Medications  . telmisartan (MICARDIS) 40 MG tablet    Sig: Take 1 tablet (40 mg total) by mouth daily.    Dispense:  90 tablet    Refill:  0    There are no discontinued medications.  Follow-up: Return in about 6 months (around 11/20/2017) for follow up diabetes.   Crecencio Mc, MD

## 2017-05-23 DIAGNOSIS — R609 Edema, unspecified: Secondary | ICD-10-CM | POA: Insufficient documentation

## 2017-05-23 NOTE — Assessment & Plan Note (Signed)
Secondary to use of amlodipine.  Stopping today.   Use of laix 20 mg daily until edema resolves

## 2017-05-23 NOTE — Assessment & Plan Note (Signed)
Stopping amlodipine due to increased edema Starting telmisartan at 40 mg dose  . Will wean metoprolol to off  Over 2 weeks and increase telmisartan to 80 mg daily if needed. rtc one week for bmet

## 2017-05-25 DIAGNOSIS — H1013 Acute atopic conjunctivitis, bilateral: Secondary | ICD-10-CM | POA: Diagnosis not present

## 2017-06-08 ENCOUNTER — Other Ambulatory Visit: Payer: Self-pay | Admitting: Internal Medicine

## 2017-06-18 ENCOUNTER — Telehealth: Payer: Self-pay

## 2017-06-18 DIAGNOSIS — Z86711 Personal history of pulmonary embolism: Secondary | ICD-10-CM

## 2017-06-18 DIAGNOSIS — Z7901 Long term (current) use of anticoagulants: Secondary | ICD-10-CM

## 2017-06-18 NOTE — Telephone Encounter (Signed)
Copied from [place] [id number]. Topic: Appointment Scheduling - Scheduling Inquiry for Clinic >> Jun 18, 2017 11:10 AM Mylinda Latina, NT wrote: Reason for CRM: patient called to schedule her PTINR . There is no order. Please contact pt to schedule appt  CB # [phone]

## 2017-06-18 NOTE — Telephone Encounter (Signed)
LMTCB. Need to schedule pt for a lab appt. Lab has been ordered. PEC may speak with pt.

## 2017-06-18 NOTE — Addendum Note (Signed)
Addended by: Adair Laundry on: 06/18/2017 05:04 PM   Modules accepted: Orders

## 2017-06-22 ENCOUNTER — Other Ambulatory Visit (INDEPENDENT_AMBULATORY_CARE_PROVIDER_SITE_OTHER): Payer: PPO

## 2017-06-22 DIAGNOSIS — Z7901 Long term (current) use of anticoagulants: Secondary | ICD-10-CM | POA: Diagnosis not present

## 2017-06-22 LAB — PROTIME-INR
INR: 2.6 ratio — ABNORMAL HIGH (ref 0.8–1.0)
Prothrombin Time: 30.1 s — ABNORMAL HIGH (ref 9.6–13.1)

## 2017-06-22 NOTE — Telephone Encounter (Signed)
Lab appt scheduled.

## 2017-07-22 DIAGNOSIS — H1013 Acute atopic conjunctivitis, bilateral: Secondary | ICD-10-CM | POA: Diagnosis not present

## 2017-07-29 ENCOUNTER — Telehealth: Payer: Self-pay

## 2017-07-29 ENCOUNTER — Other Ambulatory Visit: Payer: Self-pay

## 2017-07-29 ENCOUNTER — Telehealth: Payer: Self-pay | Admitting: Internal Medicine

## 2017-07-29 DIAGNOSIS — E538 Deficiency of other specified B group vitamins: Secondary | ICD-10-CM

## 2017-07-29 NOTE — Telephone Encounter (Signed)
Copied from [place] [phone]. Topic: Quick Communication - See Telephone Encounter >> Jul 29, 2017 11:28 AM Ivar Drape wrote: CRM for notification. See Telephone encounter for: 07/29/17. [place] w/[facility] [phone] ex [id number], Case ID # [id number] needs some information on the patient's cyanocobalamin (,VITAMIN B-12,) 1000 MCG/ML injection medication.

## 2017-07-29 NOTE — Telephone Encounter (Signed)
Copied from [place] [id number]. Topic: General - Other >> Jul 29, 2017  8:48 AM Oneta Rack wrote: Osvaldo Human name: Edwena Felty  Relation to pt: [facility] option  Call back number: [phone] option 0 option 3 option 3 fax [phone]  Pharmacy: [place], [place] - [place] [phone] (Phone) [phone] (Fax)  Reason for call:  Faxing PA request form to [id number] for butalbital-acetaminophen-caffeine (FIORICET, ESGIC) 50-325-40 MG tablet . (reference # [id number]) please note when received.

## 2017-07-29 NOTE — Telephone Encounter (Signed)
Question about RX from [place] at [facility]

## 2017-07-31 DIAGNOSIS — Z0279 Encounter for issue of other medical certificate: Secondary | ICD-10-CM

## 2017-07-31 NOTE — Telephone Encounter (Signed)
Questions were answered and medication has been approved through 01/05/2018.

## 2017-07-31 NOTE — Telephone Encounter (Signed)
Information has been faxed  

## 2017-08-13 ENCOUNTER — Other Ambulatory Visit: Payer: Self-pay | Admitting: Internal Medicine

## 2017-08-17 ENCOUNTER — Other Ambulatory Visit (INDEPENDENT_AMBULATORY_CARE_PROVIDER_SITE_OTHER): Payer: PPO

## 2017-08-17 DIAGNOSIS — Z7901 Long term (current) use of anticoagulants: Secondary | ICD-10-CM | POA: Diagnosis not present

## 2017-08-17 LAB — PROTIME-INR
INR: 2.1 ratio — ABNORMAL HIGH (ref 0.8–1.0)
PROTHROMBIN TIME: 24 s — AB (ref 9.6–13.1)

## 2017-08-19 DIAGNOSIS — H903 Sensorineural hearing loss, bilateral: Secondary | ICD-10-CM | POA: Diagnosis not present

## 2017-08-20 NOTE — Telephone Encounter (Signed)
Medicare part D has denied coverage of pt's vitamin b12 injections.

## 2017-08-20 NOTE — Telephone Encounter (Signed)
1) patient has pernicious anemia and is injection dependent. Please confirm whether they will not pay because a Prior auth needs to be done or because she needs to come to office to have them done

## 2017-08-25 MED ORDER — CYANOCOBALAMIN 1000 MCG/ML IJ SOLN: 1000.0000 ug | INTRAMUSCULAR | 2 refills | Status: DC

## 2017-08-25 NOTE — Telephone Encounter (Signed)
Patient part B insurance will pay but patient will have to 20 % of total charge in office for insurance to pay. The price would be about 14 - 16 dollars per month at most. Good RX patient can get 3 month supply for less then $ 14 at [facility] . Spoke with patient and patient would like to give her own injections as she has been doing the past few months .  [facility].

## 2017-08-25 NOTE — Telephone Encounter (Signed)
Thank you Juliann Pulse for doing the heavy lifting!  TT

## 2017-08-26 ENCOUNTER — Other Ambulatory Visit: Payer: Self-pay | Admitting: Internal Medicine

## 2017-08-26 ENCOUNTER — Ambulatory Visit (INDEPENDENT_AMBULATORY_CARE_PROVIDER_SITE_OTHER): Payer: PPO

## 2017-08-26 VITALS — BP 148/90 | HR 67

## 2017-08-26 DIAGNOSIS — I10 Essential (primary) hypertension: Secondary | ICD-10-CM | POA: Diagnosis not present

## 2017-08-26 MED ORDER — TELMISARTAN 80 MG PO TABS: 80.0000 mg | ORAL_TABLET | Freq: Every day | ORAL | 1 refills | Status: DC

## 2017-08-26 NOTE — Progress Notes (Addendum)
Patient walks into office and brings in home blood pressure readings that have been elevated . Per verbal orders from Dr Derrel Nip to check patient blood pressure and she will review home blood pressure readings.  Checked Blood pressure left arm 148/90 pulse 67 O2 97% .  She states for the last couple of days blood pressure  has been elevated highest reading 195/98 to  Lower reading 145/86-138/93.  Patient is complaint with medication telmisartan  40mg  1 daily and furosemide 20 mg daily.   Per verbal orders from Dr Derrel Nip patient is to increase telmisartan to 80mg  1 day , patient verbalized understanding. Advised patient to continue to monitor blood pressure.    I have reviewed the above information and agree with above.   Deborra Medina, MD

## 2017-08-26 NOTE — Progress Notes (Signed)
telmisartan dose increased to 80 mg for high readings at home confirmed with RN visit today

## 2017-09-08 ENCOUNTER — Other Ambulatory Visit: Payer: Self-pay | Admitting: Radiology

## 2017-09-08 ENCOUNTER — Other Ambulatory Visit: Payer: PPO

## 2017-09-08 ENCOUNTER — Other Ambulatory Visit (INDEPENDENT_AMBULATORY_CARE_PROVIDER_SITE_OTHER): Payer: PPO

## 2017-09-08 DIAGNOSIS — Z5181 Encounter for therapeutic drug level monitoring: Secondary | ICD-10-CM

## 2017-09-08 DIAGNOSIS — Z7901 Long term (current) use of anticoagulants: Secondary | ICD-10-CM | POA: Diagnosis not present

## 2017-09-08 LAB — PROTIME-INR
INR: 2.5 ratio — ABNORMAL HIGH (ref 0.8–1.0)
PROTHROMBIN TIME: 28.3 s — AB (ref 9.6–13.1)

## 2017-09-29 ENCOUNTER — Ambulatory Visit: Payer: PPO | Admitting: Internal Medicine

## 2017-10-05 ENCOUNTER — Other Ambulatory Visit: Payer: Self-pay | Admitting: Internal Medicine

## 2017-10-09 ENCOUNTER — Other Ambulatory Visit: Payer: Self-pay | Admitting: Radiology

## 2017-10-09 DIAGNOSIS — Z7901 Long term (current) use of anticoagulants: Principal | ICD-10-CM

## 2017-10-09 DIAGNOSIS — Z5181 Encounter for therapeutic drug level monitoring: Secondary | ICD-10-CM

## 2017-10-12 ENCOUNTER — Other Ambulatory Visit (INDEPENDENT_AMBULATORY_CARE_PROVIDER_SITE_OTHER): Payer: PPO

## 2017-10-12 DIAGNOSIS — Z5181 Encounter for therapeutic drug level monitoring: Secondary | ICD-10-CM

## 2017-10-12 DIAGNOSIS — Z7901 Long term (current) use of anticoagulants: Secondary | ICD-10-CM | POA: Diagnosis not present

## 2017-10-12 LAB — PROTIME-INR
INR: 2.3 ratio — ABNORMAL HIGH (ref 0.8–1.0)
Prothrombin Time: 26.9 s — ABNORMAL HIGH (ref 9.6–13.1)

## 2017-10-27 ENCOUNTER — Other Ambulatory Visit: Payer: Self-pay | Admitting: Internal Medicine

## 2017-10-27 DIAGNOSIS — F5105 Insomnia due to other mental disorder: Principal | ICD-10-CM

## 2017-10-27 DIAGNOSIS — F409 Phobic anxiety disorder, unspecified: Secondary | ICD-10-CM

## 2017-10-28 NOTE — Telephone Encounter (Signed)
Alprazolam:    Refilled: 11/21/2016 Imipramine   Refilled: 04/27/2017  Last OV: 05/20/2017 Next OV: not scheduled

## 2017-11-09 ENCOUNTER — Telehealth: Payer: Self-pay

## 2017-11-09 DIAGNOSIS — E785 Hyperlipidemia, unspecified: Secondary | ICD-10-CM

## 2017-11-09 DIAGNOSIS — E1169 Type 2 diabetes mellitus with other specified complication: Secondary | ICD-10-CM

## 2017-11-09 DIAGNOSIS — Z7901 Long term (current) use of anticoagulants: Secondary | ICD-10-CM

## 2017-11-09 DIAGNOSIS — E119 Type 2 diabetes mellitus without complications: Secondary | ICD-10-CM

## 2017-11-09 NOTE — Telephone Encounter (Signed)
Is it okay to order the A1c and the INR? Last A1c was in 11/2016 and last INR was 10/12/2017.

## 2017-11-09 NOTE — Telephone Encounter (Signed)
She had an A1c in march, and she is due for 6 month follow up on all labs.   Fasting  Labs ordered   [facility] to run

## 2017-11-09 NOTE — Telephone Encounter (Signed)
No lab orders or anything giving the ok to schedule nurse visit/lab visit.  Copied from [place] [id number]. Topic: Appointment Scheduling - Scheduling Inquiry for Clinic >> Nov 09, 2017  2:56 PM Nils Flack, Marland Kitchen wrote: Reason for CRM: pt would like to have an a1c, a flu shot, and pt inr. She would like this all done at the same time.  Please call [phone] to schedule please

## 2017-11-09 NOTE — Addendum Note (Signed)
Addended by: Crecencio Mc on: 11/09/2017 05:14 PM   Modules accepted: Orders

## 2017-11-10 NOTE — Addendum Note (Signed)
Addended by: Adair Laundry on: 11/10/2017 09:34 AM   Modules accepted: Orders

## 2017-11-10 NOTE — Telephone Encounter (Signed)
Spoke with pt and scheduled her for a lab appt and flu shot. Pt is aware they are fasting labs. Pt is aware of appt date and time.

## 2017-11-13 ENCOUNTER — Ambulatory Visit (INDEPENDENT_AMBULATORY_CARE_PROVIDER_SITE_OTHER): Payer: PPO

## 2017-11-13 ENCOUNTER — Other Ambulatory Visit (INDEPENDENT_AMBULATORY_CARE_PROVIDER_SITE_OTHER): Payer: PPO

## 2017-11-13 DIAGNOSIS — E119 Type 2 diabetes mellitus without complications: Secondary | ICD-10-CM

## 2017-11-13 DIAGNOSIS — Z7901 Long term (current) use of anticoagulants: Secondary | ICD-10-CM

## 2017-11-13 DIAGNOSIS — Z23 Encounter for immunization: Secondary | ICD-10-CM | POA: Diagnosis not present

## 2017-11-13 DIAGNOSIS — E785 Hyperlipidemia, unspecified: Secondary | ICD-10-CM | POA: Diagnosis not present

## 2017-11-13 DIAGNOSIS — E1169 Type 2 diabetes mellitus with other specified complication: Secondary | ICD-10-CM | POA: Diagnosis not present

## 2017-11-13 LAB — COMPREHENSIVE METABOLIC PANEL
ALBUMIN: 4.2 g/dL (ref 3.5–5.2)
ALT: 22 U/L (ref 0–35)
AST: 20 U/L (ref 0–37)
Alkaline Phosphatase: 95 U/L (ref 39–117)
BILIRUBIN TOTAL: 0.3 mg/dL (ref 0.2–1.2)
BUN: 21 mg/dL (ref 6–23)
CALCIUM: 9 mg/dL (ref 8.4–10.5)
CO2: 29 meq/L (ref 19–32)
CREATININE: 1.01 mg/dL (ref 0.40–1.20)
Chloride: 104 mEq/L (ref 96–112)
GFR: 57.83 mL/min — ABNORMAL LOW (ref >60.00)
Glucose, Bld: 106 mg/dL — ABNORMAL HIGH (ref 70–99)
Potassium: 4.4 mEq/L (ref 3.5–5.1)
Sodium: 140 mEq/L (ref 135–145)
Total Protein: 6.6 g/dL (ref 6.0–8.3)

## 2017-11-13 LAB — PROTIME-INR
INR: 2.4 ratio — AB (ref 0.8–1.0)
Prothrombin Time: 27.3 s — ABNORMAL HIGH (ref 9.6–13.1)

## 2017-11-13 LAB — LIPID PANEL
CHOL/HDL RATIO: 3
Cholesterol: 168 mg/dL (ref 0–200)
HDL: 51.9 mg/dL (ref >39.00)
LDL Cholesterol: 85 mg/dL (ref 0–99)
NONHDL: 116.59
Triglycerides: 160 mg/dL — ABNORMAL HIGH (ref 0.0–149.0)
VLDL: 32 mg/dL (ref 0.0–40.0)

## 2017-11-13 LAB — MICROALBUMIN / CREATININE URINE RATIO
Creatinine,U: 228.5 mg/dL
MICROALB UR: 2.2 mg/dL — AB (ref 0.0–1.9)
Microalb Creat Ratio: 1 mg/g (ref 0.0–30.0)

## 2017-11-13 LAB — HEMOGLOBIN A1C: Hgb A1c MFr Bld: 6.4 % (ref 4.6–6.5)

## 2017-11-13 NOTE — Progress Notes (Signed)
Pt was seen for NV for high dose flu vaccine given in LD pt tolerated well.

## 2017-12-06 ENCOUNTER — Other Ambulatory Visit: Payer: Self-pay | Admitting: Internal Medicine

## 2017-12-17 ENCOUNTER — Other Ambulatory Visit (INDEPENDENT_AMBULATORY_CARE_PROVIDER_SITE_OTHER): Payer: PPO

## 2017-12-17 DIAGNOSIS — Z7901 Long term (current) use of anticoagulants: Secondary | ICD-10-CM

## 2017-12-17 LAB — PROTIME-INR
INR: 1.9 ratio — ABNORMAL HIGH (ref 0.8–1.0)
Prothrombin Time: 21.7 s — ABNORMAL HIGH (ref 9.6–13.1)

## 2017-12-18 ENCOUNTER — Other Ambulatory Visit: Payer: Self-pay | Admitting: Internal Medicine

## 2018-01-07 ENCOUNTER — Other Ambulatory Visit: Payer: Self-pay | Admitting: Radiology

## 2018-01-07 ENCOUNTER — Other Ambulatory Visit (INDEPENDENT_AMBULATORY_CARE_PROVIDER_SITE_OTHER): Payer: PPO

## 2018-01-07 DIAGNOSIS — Z7901 Long term (current) use of anticoagulants: Secondary | ICD-10-CM | POA: Diagnosis not present

## 2018-01-07 DIAGNOSIS — Z5181 Encounter for therapeutic drug level monitoring: Secondary | ICD-10-CM

## 2018-01-07 LAB — PROTIME-INR
INR: 2.1 ratio — ABNORMAL HIGH (ref 0.8–1.0)
Prothrombin Time: 24.3 s — ABNORMAL HIGH (ref 9.6–13.1)

## 2018-02-09 ENCOUNTER — Encounter: Payer: Self-pay | Admitting: Internal Medicine

## 2018-02-09 ENCOUNTER — Ambulatory Visit (INDEPENDENT_AMBULATORY_CARE_PROVIDER_SITE_OTHER): Payer: PPO | Admitting: Internal Medicine

## 2018-02-09 ENCOUNTER — Encounter

## 2018-02-09 VITALS — BP 110/72 | HR 76 | Temp 97.8°F | Resp 16 | Ht 61.0 in | Wt 159.4 lb

## 2018-02-09 DIAGNOSIS — H7292 Unspecified perforation of tympanic membrane, left ear: Secondary | ICD-10-CM

## 2018-02-09 DIAGNOSIS — J0100 Acute maxillary sinusitis, unspecified: Secondary | ICD-10-CM | POA: Diagnosis not present

## 2018-02-09 DIAGNOSIS — H66012 Acute suppurative otitis media with spontaneous rupture of ear drum, left ear: Secondary | ICD-10-CM

## 2018-02-09 MED ORDER — HYDROCOD POLST-CPM POLST ER 10-8 MG/5ML PO SUER: 5.0000 mL | Freq: Two times a day (BID) | ORAL | 0 refills | Status: DC | PRN

## 2018-02-09 MED ORDER — PREDNISONE 10 MG PO TABS: ORAL_TABLET | ORAL | 0 refills | Status: DC

## 2018-02-09 MED ORDER — AMOXICILLIN-POT CLAVULANATE 875-125 MG PO TABS: 1.0000 | ORAL_TABLET | Freq: Two times a day (BID) | ORAL | 0 refills | Status: DC

## 2018-02-09 NOTE — Progress Notes (Signed)
Subjective:  Patient ID: [name], female    DOB: 05-Oct-1950  Age: 68 y.o. MRN: [id number]  CC: The primary encounter diagnosis was Non-recurrent acute suppurative otitis media of left ear with spontaneous rupture of tympanic membrane. Diagnoses of Acute non-recurrent maxillary sinusitis and Ruptured tympanic membrane, left were also pertinent to this visit.  HPI [name] presents for evaluation of sinus congestion and severe left sided ear and jaw pain.  Symptoms started 4 days ago  With sore throat , rhinitis and progressive sinus congestion, followed by diffuse facial pain over maxilary sinsues.  Had an episode of severe pain assoc with a "tearing" feeling when she tried to forcibly blow her congested nose. Her hearing has felt muffled on the left .   Outpatient Medications Prior to Visit  Medication Sig Dispense Refill  . ALPRAZolam (XANAX) 0.25 MG tablet TAKE 1 TABLET BY MOUTH AT BEDTIME AS NEEDED FOR ANXIETY 30 tablet 3  . aspirin 81 MG tablet Take 81 mg by mouth daily.    Marland Kitchen atorvastatin (LIPITOR) 40 MG tablet Take 1 tablet (40 mg total) by mouth daily. 90 tablet 3  . butalbital-acetaminophen-caffeine (FIORICET, ESGIC) 50-325-40 MG tablet TAKE ONE TABLET BY MOUTH EVERY 6 HOURS AS NEEDED FOR HEADACHE 90 tablet 2  . Cholecalciferol (VITAMIN D3) 1000 UNITS CAPS Take by mouth.    . cyanocobalamin (,VITAMIN B-12,) 1000 MCG/ML injection Inject 1 mL (1,000 mcg total) into the muscle every 30 (thirty) days. 10 mL 2  . furosemide (LASIX) 20 MG tablet TAKE 1 TABLET BY MOUTH ONCE DAILY 90 tablet 1  . imipramine (TOFRANIL) 25 MG tablet TAKE 1 TABLET BY MOUTH AT BEDTIME 90 tablet 1  . metoprolol succinate (TOPROL-XL) 50 MG 24 hr tablet TAKE 1 TABLET BY MOUTH DAILY (Patient taking differently: Take 75 mg by mouth daily. ) 90 tablet 1  . Omega-3 Fatty Acids (FISH OIL) 1000 MG CAPS Take 1 capsule daily by mouth.    Marland Kitchen omeprazole (PRILOSEC) 20 MG capsule Take 20 mg by mouth daily.    .  Syringe/Needle, Disp, (SYRINGE 3CC/25GX1") 25G X 1" 3 ML MISC Use for b12 injections 50 each 0  . telmisartan (MICARDIS) 80 MG tablet Take 1 tablet (80 mg total) by mouth daily. 90 tablet 1  . traMADol (ULTRAM) 50 MG tablet Take 1 tablet (50 mg total) by mouth every 8 (eight) hours as needed. 90 tablet 4  . warfarin (COUMADIN) 6 MG tablet TAKE 1 TABLET BY MOUTH ONCE DAILY 90 tablet 1  . amLODipine (NORVASC) 10 MG tablet Take 1 tablet (10 mg total) by mouth daily. (Patient not taking: Reported on 02/09/2018) 90 tablet 1   Facility-Administered Medications Prior to Visit  Medication Dose Route Frequency Provider Last Rate Last Dose  . cyanocobalamin ((VITAMIN B-12)) injection 1,000 mcg  1,000 mcg Intramuscular Once Crecencio Mc, MD        Review of Systems;  Patient denies , unintentional weight loss, skin rash, eye pain,  dysphagia,  hemoptysis ,  dyspnea, wheezing, chest pain, palpitations, orthopnea, edema, abdominal pain, nausea, melena, diarrhea, constipation, flank pain, dysuria, hematuria, urinary  Frequency, nocturia, numbness, tingling, seizures,  Focal weakness, Loss of consciousness,  Tremor, insomnia, depression, anxiety, and suicidal ideation.      Objective:  BP 110/72 (BP Location: Left Arm, Patient Position: Sitting, Cuff Size: Normal)   Pulse 76   Temp 97.8 F (36.6 C) (Oral)   Resp 16   Ht 5\' 1"  (1.549 m)  Wt 159 lb 6.4 oz (72.3 kg)   SpO2 95%   BMI 30.12 kg/m   BP Readings from Last 3 Encounters:  02/09/18 110/72  08/26/17 (!) 148/90  05/20/17 118/64    Wt Readings from Last 3 Encounters:  02/09/18 159 lb 6.4 oz (72.3 kg)  05/20/17 164 lb (74.4 kg)  05/14/17 160 lb (72.6 kg)    General appearance: alert, cooperative and appears stated age Ears: left TM obscured by blood, right TM normal  Throat: lips, mucosa, and tongue normal; teeth and gums normal Face: bilateral maxillary sinus pain  Neck: cervical lymphadenopathy, no carotid bruit, supple,  symmetrical, trachea midline and thyroid not enlarged, symmetric, no tenderness/mass/nodules Lungs: clear to auscultation bilaterally Heart: regular rate and rhythm, S1, S2 normal, no murmur, click, rub or gallop Skin: Skin color, texture, turgor normal. No rashes or lesions Lymph nodes: Cervical, supraclavicular, and axillary nodes normal.   Assessment & Plan:   Problem List Items Addressed This Visit    Ruptured tympanic membrane, left    Complicated by coumadin use.  Secondary to sinusitis.  Empiric antibiotics and urgent ENT evaluation . Advised to suspend coumadin for tonight and resume at 1/2 dose for three  days before resuming regular dose.       Acute maxillary sinusitis    Given chronicity of symptoms, development of facial pain and exam consistent with bacterial URI,  Will treat with empiric antibiotics, decongestants, prednisone taper, cough suppressant .    Probiotic advised       Relevant Medications   amoxicillin-clavulanate (AUGMENTIN) 875-125 MG tablet   predniSONE (DELTASONE) 10 MG tablet   chlorpheniramine-HYDROcodone (TUSSIONEX PENNKINETIC ER) 10-8 MG/5ML SUER    Other Visit Diagnoses    Non-recurrent acute suppurative otitis media of left ear with spontaneous rupture of tympanic membrane    -  Primary   Relevant Medications   amoxicillin-clavulanate (AUGMENTIN) 875-125 MG tablet   Other Relevant Orders   Ambulatory referral to ENT      I have discontinued Gaylon F. Ildefonso's amLODipine. I am also having her start on amoxicillin-clavulanate, predniSONE, and chlorpheniramine-HYDROcodone. Additionally, I am having her maintain her omeprazole, Vitamin D3, aspirin, traMADol, Fish Oil, butalbital-acetaminophen-caffeine, atorvastatin, SYRINGE 3CC/25GX1", cyanocobalamin, telmisartan, metoprolol succinate, imipramine, ALPRAZolam, furosemide, and warfarin. We will continue to administer cyanocobalamin.  Meds ordered this encounter  Medications  . amoxicillin-clavulanate  (AUGMENTIN) 875-125 MG tablet    Sig: Take 1 tablet by mouth 2 (two) times daily.    Dispense:  14 tablet    Refill:  0  . predniSONE (DELTASONE) 10 MG tablet    Sig: 6 tablets on Day 1 , then reduce by 1 tablet daily until gone    Dispense:  21 tablet    Refill:  0  . chlorpheniramine-HYDROcodone (TUSSIONEX PENNKINETIC ER) 10-8 MG/5ML SUER    Sig: Take 5 mLs by mouth every 12 (twelve) hours as needed.    Dispense:  180 mL    Refill:  0    Medications Discontinued During This Encounter  Medication Reason  . amLODipine (NORVASC) 10 MG tablet Patient has not taken in last 30 days    Follow-up: No follow-ups on file.   Crecencio Mc, MD

## 2018-02-09 NOTE — Patient Instructions (Addendum)
You do appear to have rupture your left ear drum .  This occurred because of the sinus congestion and infection   I  am treating you for bacterial sinusitis    I am prescribing an antibiotic (augmentin) and a prednisone taper   To manage the infection and the inflammation in your ear/sinuses.   I also advise use of the following OTC meds to help with your other symptoms.   Take generic OTC benadryl 25 mg at bedtime  for the drainage,  Sudafed PE 20 mg every 6 hours  if needed for congestion,   Use tussionex fo r the pain and cough   skip your coumadin dose tonight   Tale 1/2 your usual dose on Wednesday , Thursday and Friday .  Resume regular dose on Saturday    Please take a probiotic ( Align, Flora que or Culturelle) OR A GENERIC EQUIVALENT for three weeks since you are taking an  antibiotic to prevent a very serious antibiotic associated infection  Called clostridium dificile colitis that can cause diarrhea, multi organ failure, sepsis and death if not managed.

## 2018-02-10 ENCOUNTER — Encounter: Payer: Self-pay | Admitting: Internal Medicine

## 2018-02-10 DIAGNOSIS — H73009 Acute myringitis, unspecified ear: Secondary | ICD-10-CM | POA: Diagnosis not present

## 2018-02-10 DIAGNOSIS — J0100 Acute maxillary sinusitis, unspecified: Secondary | ICD-10-CM | POA: Insufficient documentation

## 2018-02-10 DIAGNOSIS — H7292 Unspecified perforation of tympanic membrane, left ear: Secondary | ICD-10-CM | POA: Insufficient documentation

## 2018-02-10 DIAGNOSIS — J019 Acute sinusitis, unspecified: Secondary | ICD-10-CM | POA: Insufficient documentation

## 2018-02-10 DIAGNOSIS — H60339 Swimmer's ear, unspecified ear: Secondary | ICD-10-CM | POA: Diagnosis not present

## 2018-02-10 NOTE — Assessment & Plan Note (Signed)
Complicated by coumadin use.  Secondary to sinusitis.  Empiric antibiotics and urgent ENT evaluation . Advised to suspend coumadin for tonight and resume at 1/2 dose for three  days before resuming regular dose.

## 2018-02-10 NOTE — Assessment & Plan Note (Signed)
Given chronicity of symptoms, development of facial pain and exam consistent with bacterial URI,  Will treat with empiric antibiotics, decongestants, prednisone taper, cough suppressant .    Probiotic advised

## 2018-02-15 ENCOUNTER — Other Ambulatory Visit: Payer: Self-pay | Admitting: Internal Medicine

## 2018-02-15 DIAGNOSIS — T3695XA Adverse effect of unspecified systemic antibiotic, initial encounter: Principal | ICD-10-CM

## 2018-02-15 DIAGNOSIS — K521 Toxic gastroenteritis and colitis: Secondary | ICD-10-CM

## 2018-02-15 NOTE — Progress Notes (Signed)
c dif

## 2018-02-15 NOTE — Assessment & Plan Note (Signed)
Checking c dif

## 2018-02-23 ENCOUNTER — Other Ambulatory Visit: Payer: Self-pay | Admitting: Internal Medicine

## 2018-02-23 NOTE — Telephone Encounter (Signed)
Pt is aware that Dr. Derrel Nip is out of the office until Monday and stated that she has enough to get her through until she returns.   Refilled: 11/21/2016 Last OV: 02/09/2018 Next OV: not scheduled

## 2018-02-24 DIAGNOSIS — H73009 Acute myringitis, unspecified ear: Secondary | ICD-10-CM | POA: Diagnosis not present

## 2018-02-24 DIAGNOSIS — H60339 Swimmer's ear, unspecified ear: Secondary | ICD-10-CM | POA: Diagnosis not present

## 2018-02-25 ENCOUNTER — Other Ambulatory Visit: Payer: Self-pay | Admitting: Radiology

## 2018-02-25 DIAGNOSIS — Z7901 Long term (current) use of anticoagulants: Principal | ICD-10-CM

## 2018-02-25 DIAGNOSIS — Z5181 Encounter for therapeutic drug level monitoring: Secondary | ICD-10-CM

## 2018-02-26 ENCOUNTER — Other Ambulatory Visit: Payer: PPO

## 2018-03-01 ENCOUNTER — Other Ambulatory Visit (INDEPENDENT_AMBULATORY_CARE_PROVIDER_SITE_OTHER): Payer: PPO

## 2018-03-01 DIAGNOSIS — Z5181 Encounter for therapeutic drug level monitoring: Secondary | ICD-10-CM

## 2018-03-01 DIAGNOSIS — Z7901 Long term (current) use of anticoagulants: Secondary | ICD-10-CM

## 2018-03-01 LAB — PROTIME-INR
INR: 3.5 ratio — ABNORMAL HIGH (ref 0.8–1.0)
Prothrombin Time: 40.3 s — ABNORMAL HIGH (ref 9.6–13.1)

## 2018-03-03 ENCOUNTER — Other Ambulatory Visit: Payer: Self-pay | Admitting: Internal Medicine

## 2018-03-15 ENCOUNTER — Telehealth: Payer: Self-pay

## 2018-03-15 DIAGNOSIS — Z7901 Long term (current) use of anticoagulants: Secondary | ICD-10-CM

## 2018-03-15 NOTE — Telephone Encounter (Signed)
Copied from [place] [id number]. Topic: Appointment Scheduling - Scheduling Inquiry for Clinic >> Mar 15, 2018  3:57 PM Bea Graff, NT wrote: Reason for CRM: Pt wanting to schedule her PT INR appt for tomorrow. Please call to schedule once order is placed.

## 2018-03-16 ENCOUNTER — Other Ambulatory Visit: Payer: PPO

## 2018-03-16 NOTE — Telephone Encounter (Signed)
Pt is scheduled for lab work on 03/18/2018.

## 2018-03-16 NOTE — Addendum Note (Signed)
Addended by: Adair Laundry on: 03/16/2018 11:12 AM   Modules accepted: Orders

## 2018-03-16 NOTE — Telephone Encounter (Signed)
LMTCB. Need to schedule pt for a non fasting lab appt. Lab has been ordered. PEC may speak with pt.

## 2018-03-17 ENCOUNTER — Other Ambulatory Visit: Payer: Self-pay | Admitting: Radiology

## 2018-03-17 DIAGNOSIS — Z7901 Long term (current) use of anticoagulants: Principal | ICD-10-CM

## 2018-03-17 DIAGNOSIS — Z5181 Encounter for therapeutic drug level monitoring: Secondary | ICD-10-CM

## 2018-03-18 ENCOUNTER — Other Ambulatory Visit (INDEPENDENT_AMBULATORY_CARE_PROVIDER_SITE_OTHER): Payer: PPO

## 2018-03-18 DIAGNOSIS — Z5181 Encounter for therapeutic drug level monitoring: Secondary | ICD-10-CM

## 2018-03-18 DIAGNOSIS — Z7901 Long term (current) use of anticoagulants: Secondary | ICD-10-CM

## 2018-03-18 LAB — PROTIME-INR
INR: 2.9 ratio — ABNORMAL HIGH (ref 0.8–1.0)
Prothrombin Time: 33 s — ABNORMAL HIGH (ref 9.6–13.1)

## 2018-03-25 ENCOUNTER — Ambulatory Visit: Payer: PPO

## 2018-03-26 ENCOUNTER — Ambulatory Visit: Payer: PPO

## 2018-03-29 ENCOUNTER — Other Ambulatory Visit: Payer: Self-pay | Admitting: Internal Medicine

## 2018-04-27 ENCOUNTER — Ambulatory Visit (INDEPENDENT_AMBULATORY_CARE_PROVIDER_SITE_OTHER): Payer: PPO

## 2018-04-27 ENCOUNTER — Other Ambulatory Visit: Payer: Self-pay

## 2018-04-27 ENCOUNTER — Other Ambulatory Visit: Payer: Self-pay | Admitting: Internal Medicine

## 2018-04-27 DIAGNOSIS — E119 Type 2 diabetes mellitus without complications: Secondary | ICD-10-CM

## 2018-04-27 DIAGNOSIS — Z7901 Long term (current) use of anticoagulants: Secondary | ICD-10-CM

## 2018-04-27 DIAGNOSIS — Z0000 Encounter for general adult medical examination without abnormal findings: Secondary | ICD-10-CM | POA: Diagnosis not present

## 2018-04-27 DIAGNOSIS — E785 Hyperlipidemia, unspecified: Secondary | ICD-10-CM

## 2018-04-27 MED ORDER — TELMISARTAN 80 MG PO TABS: 80.0000 mg | ORAL_TABLET | Freq: Every day | ORAL | 1 refills | Status: DC

## 2018-04-27 MED ORDER — METOPROLOL SUCCINATE ER 50 MG PO TB24: 50.0000 mg | ORAL_TABLET | Freq: Every day | ORAL | 0 refills | Status: DC

## 2018-04-27 MED ORDER — IMIPRAMINE HCL 25 MG PO TABS: 25.0000 mg | ORAL_TABLET | Freq: Every day | ORAL | 1 refills | Status: DC

## 2018-04-27 NOTE — Patient Instructions (Addendum)
  [name] , Thank you for taking time to come for your Medicare Wellness Visit. I appreciate your ongoing commitment to your health goals. Please review the following plan we discussed and let me know if I can assist you in the future.   These are the goals we discussed: Goals      Patient Stated   . Lose weight (pt-stated)     Eat healthy Stay active        This is a list of the screening recommended for you and due dates:  Health Maintenance  Topic Date Due  . Complete foot exam   04/01/1960  . Eye exam for diabetics  03/10/2018  . Hemoglobin A1C  05/14/2018  . Flu Shot  08/07/2018  . Mammogram  04/23/2019  . Tetanus Vaccine  10/28/2022  . Colon Cancer Screening  12/05/2024  . DEXA scan (bone density measurement)  Completed  .  Hepatitis C: One time screening is recommended by Center for Disease Control  (CDC) for  adults born from [birth year] through 1966.   Completed  . Pneumonia vaccines  Completed

## 2018-04-27 NOTE — Progress Notes (Signed)
Subjective:   [name] is a 68 y.o. female who presents for Medicare Annual/Subsequent preventive examination.  Review of Systems:  No ROS.  Medicare Wellness Visit. Additional risk factors are reflected in the social history. Cardiac Risk Factors include: advanced age (>28men, >30 women);diabetes mellitus;hypertension    Objective:    Vitals: There were no vitals taken for this visit.  There is no height or weight on file to calculate BMI.  UTA, virtual visit.  Advanced Directives 04/27/2018 03/23/2017 03/21/2016 11/22/2015 11/16/2014 05/19/2014  Does Patient Have a Medical Advance Directive? Yes Yes Yes No No Yes  Type of Advance Directive Living will Living will;Healthcare Power of [place];Living will - - [place]  Does patient want to make changes to medical advance directive? No - Patient declined No - Patient declined No - Patient declined - - No - Patient declined  Copy of Healthcare Power of Attorney in Chart? - No - copy requested No - copy requested - - No - copy requested  Would patient like information on creating a medical advance directive? - - - No - patient declined information No - patient declined information -    Tobacco Social History   Tobacco Use  Smoking Status Never Smoker  Smokeless Tobacco Never Used     Counseling given: Not Answered   Clinical Intake:                       Past Medical History:  Diagnosis Date  . Diabetes mellitus without complication ([place])   . H/O toxic shock syndrome 2001   post urologic procedure for stone removal  . 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 Hyperlipidemia   . Hypertension   . Ischemic colitis ([place]) 2012-2013   s/p left colectomy May 2013  . Kidney stones    Past Surgical History:  Procedure Laterality Date  . ABDOMINAL HYSTERECTOMY  1996  . APPENDECTOMY    . bilateral tubal ligation     . BREAST EXCISIONAL BIOPSY Left 1989   benign  . BREAST SURGERY  1989   left biopsy, normal  . COLECTOMY Left May 2013   [place], ischemic colitis  . COLONOSCOPY WITH PROPOFOL N/A 11/16/2014   Procedure: COLONOSCOPY WITH PROPOFOL;  Surgeon: Manya Silvas, MD;  Location[facility] ENDOSCOPY;  Service: Endoscopy;  Laterality: N/A;  . ESOPHAGOGASTRODUODENOSCOPY (EGD) WITH PROPOFOL N/A 11/16/2014   Procedure: ESOPHAGOGASTRODUODENOSCOPY (EGD) WITH PROPOFOL;  Surgeon: Manya Silvas, MD;  Location[facility] ENDOSCOPY;  Service: Endoscopy;  Laterality: N/A;  . SPINE SURGERY  1984   lumbar diskectomy, [place]  . ureterolithotomy     Family History  Problem Relation Age of Onset  . Hypertension Mother   . Cancer Mother   . Diabetes Mother   . Heart disease Father   . Hypertension Father   . Diabetes Father   . Breast cancer Paternal Aunt        <50  . Breast cancer Cousin   . Alzheimer's disease Sister   . Breast cancer Sister 31       DCIS   Social History   Socioeconomic History  . Marital status: Married    Spouse name: Not on file  . Number of children: Not on file  . Years of education: Not on file  . Highest education level: Not on file  Occupational History  . Not  on file  Social Needs  . Financial resource strain: Not hard at all  . Food insecurity:    Worry: Never true    Inability: Never true  . Transportation needs:    Medical: No    Non-medical: No  Tobacco Use  . Smoking status: Never Smoker  . Smokeless tobacco: Never Used  Substance and Sexual Activity  . Alcohol use: No  . Drug use: No  . Sexual activity: Yes  Lifestyle  . Physical activity:    Days per week: Not on file    Minutes per session: Not on file  . Stress: Rather much  Relationships  . Social connections:    Talks on phone: Not on file    Gets together: Not on file    Attends religious service: Not on file    Active member of club or organization: Not on file    Attends meetings of clubs  or organizations: Not on file    Relationship status: Not on file  Other Topics Concern  . Not on file  Social History Narrative  . Not on file    Outpatient Encounter Medications as of 04/27/2018  Medication Sig  . ALPRAZolam (XANAX) 0.25 MG tablet TAKE 1 TABLET BY MOUTH AT BEDTIME AS NEEDED FOR ANXIETY  . aspirin 81 MG tablet Take 81 mg by mouth daily.  Marland Kitchen atorvastatin (LIPITOR) 40 MG tablet Take 1 tablet by mouth once daily  . butalbital-acetaminophen-caffeine (FIORICET, ESGIC) 50-325-40 MG tablet TAKE 1 TABLET BY MOUTH EVERY 6 HOURS AS NEEDED FOR  HEADACHE  . chlorpheniramine-HYDROcodone (TUSSIONEX PENNKINETIC ER) 10-8 MG/5ML SUER Take 5 mLs by mouth every 12 (twelve) hours as needed.  . Cholecalciferol (VITAMIN D3) 1000 UNITS CAPS Take by mouth.  . cyanocobalamin (,VITAMIN B-12,) 1000 MCG/ML injection Inject 1 mL (1,000 mcg total) into the muscle every 30 (thirty) days.  . furosemide (LASIX) 20 MG tablet TAKE 1 TABLET BY MOUTH ONCE DAILY  . imipramine (TOFRANIL) 25 MG tablet TAKE 1 TABLET BY MOUTH AT BEDTIME  . metoprolol succinate (TOPROL-XL) 50 MG 24 hr tablet TAKE 1 TABLET BY MOUTH ONCE DAILY  . Omega-3 Fatty Acids (FISH OIL) 1000 MG CAPS Take 1 capsule daily by mouth.  Marland Kitchen omeprazole (PRILOSEC) 20 MG capsule Take 20 mg by mouth daily.  . Syringe/Needle, Disp, (SYRINGE 3CC/25GX1") 25G X 1" 3 ML MISC Use for b12 injections  . telmisartan (MICARDIS) 80 MG tablet Take 1 tablet (80 mg total) by mouth daily.  . traMADol (ULTRAM) 50 MG tablet Take 1 tablet (50 mg total) by mouth every 8 (eight) hours as needed.  . warfarin (COUMADIN) 6 MG tablet TAKE 1 TABLET BY MOUTH ONCE DAILY  . [DISCONTINUED] amoxicillin-clavulanate (AUGMENTIN) 875-125 MG tablet Take 1 tablet by mouth 2 (two) times daily.  . [DISCONTINUED] predniSONE (DELTASONE) 10 MG tablet 6 tablets on Day 1 , then reduce by 1 tablet daily until gone   Facility-Administered Encounter Medications as of 04/27/2018  Medication  .  cyanocobalamin ((VITAMIN B-12)) injection 1,000 mcg    Activities of Daily Living In your present state of health, do you have any difficulty performing the following activities: 04/27/2018  Hearing? Y  Vision? N  Difficulty concentrating or making decisions? N  Walking or climbing stairs? N  Dressing or bathing? N  Doing errands, shopping? N  Preparing Food and eating ? N  Using the Toilet? N  In the past six months, have you accidently leaked urine? N  Do you have problems  with loss of bowel control? N  Managing your Medications? N  Managing your Finances? N  Housekeeping or managing your Housekeeping? N  Some recent data might be hidden    Patient Care Team: Crecencio Mc, MD as PCP - General (Internal Medicine) Manya Silvas, MD (Gastroenterology)   Assessment:   This is a routine wellness examination for Chaia.  I connected with patient 04/27/18 at 11:00 AM EDT by a video enabled telemedicine application and verified that I am speaking with the correct person using two identifiers. Patient stated full name and DOB. Patient gave permission to continue with virtual visit. Patient's location was at home and Nurse's location was at [place] office.   Requests labs. Deferred to pcp.  Appointment scheduled.   Diabetic- followed by pcp.   Requests refill Rx lancets, strips, imipramine, metoprolol, telmisartan; pending approval.  B12- missed last dose due to social distancing; plans to continue receiving monthly injections from her daughter at this time unless otherwise directed.   Health Screenings  Mammogram -due, she plans to schedule Colonoscopy -12/06/14 Bone Density -05/13/16. Osteoporosis. Glaucoma -none reported Hearing -hearing aid Hemoglobin A1C -11/13/17 (6.4) Cholesterol -11/13/17 (168) Vision- visits every 12 months Dental- every 6 months  Social  Alcohol intake -no Smoking history- never Smokers in home? none Illicit drug use? none Exercise -walking  Diet -regular Sexually Active -yes Multiple Partners -no  Safety  Patient feels safe at home.  Patient does have smoke detectors at home. Patient does wear sunscreen or protective clothing when in direct sunlight  Patient does wear seat belt when driving or riding with others.   Activities of Daily Living Patient can do their own household chores. Denies needing assistance with: driving, feeding themselves, getting from bed to chair, getting to the toilet, bathing/showering, dressing, managing money, climbing flight of stairs, or preparing meals.   Depression Screen Patient denies losing interest in daily life or feeling hopeless.   Stressed at times; she is husbands full time caretaker. Family support is active.  Blood pressure monitored at home averaging 130s/80s-150s/90s. Intermittent headache.Taking all medication as prescribed. Followed by pcp.   Fall Screen Patient denies being afraid of falling or falling in the last year.   Memory Screen Patient denies problems with memory, misplacing items, and is able to balance checkbook/bank accounts.  Patient is alert, normal appearance, oriented to person/place/and time. Correctly identified the president of the [place], recall of 2/3 objects, and performing simple calculations.  Patient displays appropriate judgement and can read correct time from watch face.   Immunizations The following Immunizations were discussed: Influenza, shingles, pneumonia, and tetanus.   Other Providers Patient Care Team: Crecencio Mc, MD as PCP - General (Internal Medicine) Manya Silvas, MD (Gastroenterology)  Exercise Activities and Dietary recommendations Current Exercise Habits: Home exercise routine, Type of exercise: walking, Intensity: Mild  Goals      Patient Stated   . Lose weight (pt-stated)     Eat healthy Stay active        Fall Risk Fall Risk  04/27/2018 03/23/2017 03/21/2016 05/19/2014  Falls in the past year? 0 No No Yes   Number falls in past yr: - - - 1  Injury with Fall? - - - Yes  Comment - - - Knee injury followed by Dr. Tamala Julian MCL injury  Risk for fall due to : - - - Other (Comment)  Risk for fall due to: Comment - - - Patient had injuried opposite knee and was walking upstairs  when she put too much pressure on the leg and it gave out   Depression Screen PHQ 2/9 Scores 03/23/2017 03/21/2016 05/19/2014  PHQ - 2 Score 0 0 0    Cognitive Function MMSE - Mini Mental State Exam 03/23/2017 03/21/2016  Orientation to time 5 5  Orientation to Place 5 5  Registration 3 3  Attention/ Calculation 5 5  Recall 3 3  Language- name 2 objects 2 2  Language- repeat 1 1  Language- follow 3 step command 3 3  Language- read & follow direction 1 1  Write a sentence 1 1  Copy design 1 1  Total score 30 30     6CIT Screen 04/27/2018  What Year? 0 points  What month? 0 points  What time? 0 points  Count back from 20 0 points  Months in reverse 0 points  Repeat phrase 0 points  Total Score 0    Immunization History  Administered Date(s) Administered  . Influenza, High Dose Seasonal PF 09/03/2015, 11/21/2016, 11/13/2017  . Influenza,inj,Quad PF,6+ Mos 10/27/2012, 11/02/2013  . Influenza-Unspecified 11/07/2014  . Pneumococcal Conjugate-13 05/19/2014  . Pneumococcal Polysaccharide-23 05/31/2015  . Tdap 10/27/2012   Screening Tests Health Maintenance  Topic Date Due  . FOOT EXAM  04/01/1960  . OPHTHALMOLOGY EXAM  03/10/2018  . HEMOGLOBIN A1C  05/14/2018  . INFLUENZA VACCINE  08/07/2018  . MAMMOGRAM  04/23/2019  . TETANUS/TDAP  10/28/2022  . COLONOSCOPY  12/05/2024  . DEXA SCAN  Completed  . Hepatitis C Screening  Completed  . PNA vac Low Risk Adult  Completed      Plan:    End of life planning; Advance aging; Advanced directives discussed. Copy of current HCPOA/Living Will requested.    I have personally reviewed and noted the following in the patient's chart:   . Medical and social history . Use  of alcohol, tobacco or illicit drugs  . Current medications and supplements . Functional ability and status . Nutritional status . Physical activity . Advanced directives . List of other physicians . Hospitalizations, surgeries, and ER visits in previous 12 months . Vitals . Screenings to include cognitive, depression, and falls . Referrals and appointments  In addition, I have reviewed and discussed with patient certain preventive protocols, quality metrics, and best practice recommendations. A written personalized care plan for preventive services as well as general preventive health recommendations were provided to patient.     OBrien-Blaney, Gaddiel Cullens L, LPN  9/89/2119   I have reviewed the above information and agree with above.   Deborra Medina, MD

## 2018-04-27 NOTE — Progress Notes (Signed)
INR

## 2018-04-28 ENCOUNTER — Other Ambulatory Visit (INDEPENDENT_AMBULATORY_CARE_PROVIDER_SITE_OTHER): Payer: PPO

## 2018-04-28 ENCOUNTER — Other Ambulatory Visit: Payer: Self-pay

## 2018-04-28 DIAGNOSIS — Z7901 Long term (current) use of anticoagulants: Secondary | ICD-10-CM

## 2018-04-28 DIAGNOSIS — E119 Type 2 diabetes mellitus without complications: Secondary | ICD-10-CM | POA: Diagnosis not present

## 2018-04-29 ENCOUNTER — Encounter: Payer: Self-pay | Admitting: Internal Medicine

## 2018-04-29 ENCOUNTER — Other Ambulatory Visit: Payer: PPO

## 2018-04-29 ENCOUNTER — Ambulatory Visit (INDEPENDENT_AMBULATORY_CARE_PROVIDER_SITE_OTHER): Payer: PPO | Admitting: Internal Medicine

## 2018-04-29 DIAGNOSIS — I10 Essential (primary) hypertension: Secondary | ICD-10-CM

## 2018-04-29 DIAGNOSIS — M542 Cervicalgia: Secondary | ICD-10-CM | POA: Diagnosis not present

## 2018-04-29 DIAGNOSIS — R809 Proteinuria, unspecified: Secondary | ICD-10-CM

## 2018-04-29 DIAGNOSIS — E1169 Type 2 diabetes mellitus with other specified complication: Secondary | ICD-10-CM

## 2018-04-29 DIAGNOSIS — R519 Headache, unspecified: Secondary | ICD-10-CM

## 2018-04-29 DIAGNOSIS — Z7901 Long term (current) use of anticoagulants: Secondary | ICD-10-CM | POA: Diagnosis not present

## 2018-04-29 DIAGNOSIS — E119 Type 2 diabetes mellitus without complications: Secondary | ICD-10-CM

## 2018-04-29 DIAGNOSIS — E1129 Type 2 diabetes mellitus with other diabetic kidney complication: Secondary | ICD-10-CM | POA: Diagnosis not present

## 2018-04-29 DIAGNOSIS — E785 Hyperlipidemia, unspecified: Secondary | ICD-10-CM | POA: Diagnosis not present

## 2018-04-29 DIAGNOSIS — R51 Headache: Secondary | ICD-10-CM | POA: Diagnosis not present

## 2018-04-29 LAB — COMPREHENSIVE METABOLIC PANEL
ALT: 19 IU/L (ref 0–32)
AST: 21 IU/L (ref 0–40)
Albumin/Globulin Ratio: 2.1 (ref 1.2–2.2)
Albumin: 4.1 g/dL (ref 3.8–4.8)
Alkaline Phosphatase: 105 IU/L (ref 39–117)
BUN/Creatinine Ratio: 15 (ref 12–28)
BUN: 15 mg/dL (ref 8–27)
Bilirubin Total: 0.3 mg/dL (ref 0.0–1.2)
CO2: 23 mmol/L (ref 20–29)
Calcium: 9.1 mg/dL (ref 8.7–10.3)
Chloride: 106 mmol/L (ref 96–106)
Creatinine, Ser: 0.99 mg/dL (ref 0.57–1.00)
GFR calc Af Amer: 67 mL/min/{1.73_m2} (ref >59)
GFR calc non Af Amer: 58 mL/min/{1.73_m2} — ABNORMAL LOW (ref >59)
Globulin, Total: 2 g/dL (ref 1.5–4.5)
Glucose: 111 mg/dL — ABNORMAL HIGH (ref 65–99)
Potassium: 4.2 mmol/L (ref 3.5–5.2)
Sodium: 144 mmol/L (ref 134–144)
Total Protein: 6.1 g/dL (ref 6.0–8.5)

## 2018-04-29 LAB — LIPID PANEL
Chol/HDL Ratio: 3.2 ratio (ref 0.0–4.4)
Cholesterol, Total: 158 mg/dL (ref 100–199)
HDL: 50 mg/dL (ref >39)
LDL Calculated: 83 mg/dL (ref 0–99)
Triglycerides: 127 mg/dL (ref 0–149)
VLDL Cholesterol Cal: 25 mg/dL (ref 5–40)

## 2018-04-29 LAB — HEMOGLOBIN A1C
Est. average glucose Bld gHb Est-mCnc: 128 mg/dL
Hgb A1c MFr Bld: 6.1 % — ABNORMAL HIGH (ref 4.8–5.6)

## 2018-04-29 LAB — PROTIME-INR
INR: 3.8 — ABNORMAL HIGH (ref 0.8–1.2)
Prothrombin Time: 37.7 s — ABNORMAL HIGH (ref 9.1–12.0)

## 2018-04-29 MED ORDER — DULOXETINE HCL 20 MG PO CPEP: 20.0000 mg | ORAL_CAPSULE | Freq: Every day | ORAL | 0 refills | Status: DC

## 2018-04-29 MED ORDER — WARFARIN SODIUM 6 MG PO TABS: ORAL_TABLET | ORAL | 1 refills | Status: DC

## 2018-04-29 MED ORDER — METOPROLOL SUCCINATE ER 100 MG PO TB24: 100.0000 mg | ORAL_TABLET | Freq: Every day | ORAL | 1 refills | Status: DC

## 2018-04-29 NOTE — Progress Notes (Signed)
Virtual Visit via [email or web address]  This visit type was conducted due to national recommendations for restrictions regarding the COVID-19 pandemic (e.g. social distancing).  This format is felt to be most appropriate for this patient at this time.  All issues noted in this document were discussed and addressed.  No physical exam was performed (except for noted visual exam findings with Video Visits).   I connected with@ on 05/02/18 at  9:30 AM EDT by a video enabled telemedicine application and verified that I am speaking with the correct person using two identifiers. Location patient: home Location provider: work  Persons participating in the virtual visit: patient, provider  I discussed the limitations, risks, security and privacy concerns of performing an evaluation and management service by telephone and the availability of in person appointments. I also discussed with the patient that there may be a patient responsible charge related to this service. The patient expressed understanding and agreed to proceed.  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 Last seen in Feb 2020 for otitis media and referred to ENT for  ruptured Left TM.  The patient has no signs or symptoms of COVID 19 infection (fever, cough, sore throat  or shortness of breath beyond what is typical for patient).  Patient denies contact with other persons with the above mentioned symptoms or with anyone confirmed to have COVID 19 .  She is sheltering at home.   Recent fasting labs reviewed with patient   1) anticoagulation :  Current INR 3.8  , up from 2.9in March ;  current regimen has been 6 mg warfarin  daily since Feb 26  2) type 2 DM:   a1c down  From 6.4 to 6.1 with dietary  modifications alone.     She has had an intentional reduction of  sugar in her diet.  She checks her sugars once or twice per week .   cbgs have been consistently under 130.   3) Hypertension: patient checks blood pressure multiple times daily for the last week.  Readings have been elevated and labile , ranging from 196/110 to 155/94 at rest. . She is not using NSAIDS due to chronic use of warfarin.  She is managing headaches with fioricet .  Patient is following a reduced salt diet most days and is taking medications as prescribed .  She has a history of snoring but has not had a sleep study to rule out OSA.Marland Kitchen  4) Chronic daily  headaches:  She has a headache daily that starts during her sleep and wakes her up ,  Usually around 4 am.   uses Fioricet on average 4 out of 7 days  The headaches usually occur in the right parietal area or bilaterally .  Changes in weather/barometric pressure aggravate them.   She has been waking up most mornings with a headache.  She note increased stress due to caregiver related concerns (her husband has Parkinson's Disease). She has had an  MRI brain in March 2019 t rule out mass,  And she has documented  mild degenerative changes of the mid to lower cervical spine with bone spurring (March 2019)   ROS: See pertinent positives and negatives per HPI.  Past Medical History:  Diagnosis Date  . Diabetes mellitus without complication ([place])   . H/O toxic shock syndrome 2001   post urologic procedure for stone removal  . 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 Hyperlipidemia   . Hypertension   . Ischemic colitis ([place]) 2012-2013   s/p left colectomy May 2013  . Kidney stones     Past Surgical History:  Procedure Laterality Date  . ABDOMINAL HYSTERECTOMY  1996  . APPENDECTOMY    . bilateral tubal ligation    . BREAST EXCISIONAL BIOPSY Left 1989   benign  . BREAST SURGERY  1989   left biopsy, normal  .  COLECTOMY Left May 2013   [place], ischemic colitis  . COLONOSCOPY WITH PROPOFOL N/A 11/16/2014   Procedure: COLONOSCOPY WITH PROPOFOL;  Surgeon: Manya Silvas, MD;  Location[facility] ENDOSCOPY;  Service: Endoscopy;  Laterality: N/A;  . ESOPHAGOGASTRODUODENOSCOPY (EGD) WITH PROPOFOL N/A 11/16/2014   Procedure: ESOPHAGOGASTRODUODENOSCOPY (EGD) WITH PROPOFOL;  Surgeon: Manya Silvas, MD;  Location: [facility] ENDOSCOPY;  Service: Endoscopy;  Laterality: N/A;  . SPINE SURGERY  1984   lumbar diskectomy, [place]  . ureterolithotomy      Family History  Problem Relation Age of Onset  . Hypertension Mother   . Cancer Mother   . Diabetes Mother   . Heart disease Father   . Hypertension Father   . Diabetes Father   . Breast cancer Paternal Aunt        <50  . Breast cancer Cousin   . Alzheimer's disease Sister   . Breast cancer Sister 91       DCIS    SOCIAL HX: married,  Retired,  No tobacco,  Moderate alcohol   Current Outpatient Medications:  .  ALPRAZolam (XANAX) 0.25 MG tablet, TAKE 1 TABLET BY MOUTH AT BEDTIME AS NEEDED FOR ANXIETY, Disp: 30 tablet, Rfl: 3 .  aspirin 81 MG tablet, Take 81 mg by mouth daily., Disp: , Rfl:  .  atorvastatin (LIPITOR) 40 MG tablet, Take 1 tablet by mouth once daily, Disp: 90 tablet, Rfl: 0 .  butalbital-acetaminophen-caffeine (FIORICET, ESGIC) 50-325-40 MG tablet, TAKE 1 TABLET BY MOUTH EVERY 6 HOURS AS NEEDED FOR  HEADACHE, Disp: 60 tablet, Rfl: 0 .  Cholecalciferol (VITAMIN D3) 1000 UNITS CAPS, Take by mouth., Disp: , Rfl:  .  cyanocobalamin (,VITAMIN B-12,) 1000 MCG/ML injection, Inject 1 mL (1,000 mcg total) into the muscle every 30 (thirty) days., Disp: 10 mL, Rfl: 2 .  furosemide (LASIX) 20 MG tablet, TAKE 1 TABLET BY MOUTH ONCE DAILY, Disp: 90 tablet, Rfl: 1 .  imipramine (TOFRANIL) 25 MG tablet, Take 1 tablet (25 mg total) by mouth at bedtime., Disp: 90 tablet, Rfl: 1 .  metoprolol succinate (TOPROL-XL) 100 MG 24 hr tablet, Take 1 tablet (100 mg total)  by mouth at bedtime., Disp: 90 tablet, Rfl: 1 .  Omega-3 Fatty Acids (FISH OIL) 1000 MG CAPS, Take 1 capsule daily by mouth., Disp: , Rfl:  .  omeprazole (PRILOSEC) 20 MG capsule, Take 20 mg by mouth daily., Disp: , Rfl:  .  Syringe/Needle, Disp, (SYRINGE 3CC/25GX1") 25G X 1" 3 ML MISC, Use for b12 injections, Disp: 50 each, Rfl: 0 .  telmisartan (MICARDIS) 80 MG tablet, Take 1 tablet (80 mg total) by mouth daily., Disp: 90 tablet, Rfl: 1 .  traMADol (ULTRAM) 50 MG tablet, Take 1 tablet (50 mg total) by mouth every 8 (eight) hours as needed.,  Disp: 90 tablet, Rfl: 4 .  warfarin (COUMADIN) 6 MG tablet, 1/2 tablet daily on Thursday and Sundays,  1 tablet daily all other days, Disp: 90 tablet, Rfl: 1 .  DULoxetine (CYMBALTA) 20 MG capsule, Take 1 capsule (20 mg total) by mouth daily., Disp: 90 capsule, Rfl: 0  Current Facility-Administered Medications:  .  cyanocobalamin ((VITAMIN B-12)) injection 1,000 mcg, 1,000 mcg, Intramuscular, Once, Derrel Nip, Aris Everts, MD  EXAM:  VITALS per patient if applicable:  GENERAL: alert, oriented, appears well and in no acute distress  HEENT: atraumatic, conjunttiva clear, no obvious abnormalities on inspection of external nose and ears  NECK: normal movements of the head and neck  LUNGS: on inspection no signs of respiratory distress, breathing rate appears normal, no obvious gross SOB, gasping or wheezing  CV: no obvious cyanosis  MS: moves all visible extremities without noticeable abnormality  PSYCH/NEURO: pleasant and cooperative, no obvious depression or anxiety, speech and thought processing grossly intact  ASSESSMENT AND PLAN:  Discussed the following assessment and plan:  Long term current use of anticoagulant therapy - Plan: Protime-INR  Hyperlipidemia associated with type 2 diabetes mellitus (HCC)  Cervicalgia of occipito-atlanto-axial region  Diabetes mellitus type 2, diet-controlled (HCC)  Microalbuminuria due to type 2 diabetes  mellitus ([place])  Essential hypertension  Chronic daily headache  Long term current use of anticoagulant therapy INR is elevated on 42 mg weekly dose .  Reduce weekly dose to 36 mg weekly (6 mg x 5,  3 mg x 2) and repeat inr in 2 weeks    Hyperlipidemia associated with type 2 diabetes mellitus (HCC) LDL and triglycerides are at goal on atorvastatin 40 mg dose . She has no side effects and liver enzymes are normal. No changes today .  Lab Results  Component Value Date   CHOL 158 04/28/2018   HDL 50 04/28/2018   LDLCALC 83 04/28/2018   LDLDIRECT 111.0 08/13/2016   TRIG 127 04/28/2018   CHOLHDL 3.2 04/28/2018   Lab Results  Component Value Date   ALT 19 04/28/2018   AST 21 04/28/2018   ALKPHOS 105 04/28/2018   BILITOT 0.3 04/28/2018         Cervicalgia of occipito-atlanto-axial region Plain films of cervical spine  Showed only  mild degenerative changes of the mid to lower cervical spine with bone spurring  . No alignment issues.. headaches may be due to untreated OSA   Diabetes mellitus type 2, diet-controlled ([place]) She is following a  low glycemic index diet and her diabetes is diet controlled.   A1c is 6now down to 6.1   She is taking  A baby  aspirin and atorvasatatin, and telmisartan  daily   Lab Results  Component Value Date   HGBA1C 6.1 (H) 04/28/2018   Lab Results  Component Value Date   MICROALBUR 2.2 (H) 11/13/2017       Microalbuminuria due to type 2 diabetes mellitus (HCC) Continue telmisartan 80 mg.  BP control not adequate currently.  Secondary causes of hypertension suspected. Sleep study recommended   Hypertension Not controlled on telmisartan 80 mg and Toprol XL 75 mg daily.  Increase Toprol to 100 mg .  Will add spironolactone next (prior intolerance to amlodipine due to edema).   Secondary causes suspected .  Will need sleep study and Renal artery dopplers done   Chronic daily headache Managed with tramadol and fioricet prn,  Not daily.    MRI  brain and radiographs of cervical spine were done in  March 2019 with no obvious cause. Needs sleep study to rule out OSA as cause given progressively harder to control blood pressure.  She has deferred in the past but is willing to undergo testing if BP is not controlled with 2 maximally titrated agents. Adding  Cymbalta for chronic pain management and reduction in anxiety she is experiencing related to husband's condition and COVID 19    I discussed the assessment and treatment plan with the patient. The patient was provided an opportunity to ask questions and all were answered. The patient agreed with the plan and demonstrated an understanding of the instructions.   The patient was advised to call back or seek an in-person evaluation if the symptoms worsen or if the condition fails to improve as anticipated.  I provided 25 minutes of non-face-to-face time during this encounter.   Crecencio Mc, MD

## 2018-04-29 NOTE — Patient Instructions (Signed)
Increase your metoprolol dose to 100 mg daiy in the evening  Starting cymbalta (duloxetine) for your mood and pain ,  20 mg daily after dinner  If BP and headaches have not improved in one to 2 weeks,,  I recommend a home sleep study and will add a 3rd medication called spironolactone to take in the morning  Reduce your coumadin dose to 3 mg on Thursdays and Sundays. Continue 6 mg all other days  Repeat INR in 2 weeks

## 2018-05-02 DIAGNOSIS — E1129 Type 2 diabetes mellitus with other diabetic kidney complication: Secondary | ICD-10-CM | POA: Insufficient documentation

## 2018-05-02 DIAGNOSIS — R809 Proteinuria, unspecified: Secondary | ICD-10-CM

## 2018-05-02 DIAGNOSIS — R51 Headache: Secondary | ICD-10-CM

## 2018-05-02 DIAGNOSIS — R519 Headache, unspecified: Secondary | ICD-10-CM | POA: Insufficient documentation

## 2018-05-02 MED ORDER — WARFARIN SODIUM 6 MG PO TABS: ORAL_TABLET | ORAL | 1 refills | Status: DC

## 2018-05-02 NOTE — Assessment & Plan Note (Signed)
Continue telmisartan 80 mg.  BP control not adequate currently.  Secondary causes of hypertension suspected. Sleep study recommended

## 2018-05-02 NOTE — Assessment & Plan Note (Signed)
She is following a  low glycemic index diet and her diabetes is diet controlled.   A1c is 6now down to 6.1   She is taking  A baby  aspirin and atorvasatatin, and telmisartan  daily   Lab Results  Component Value Date   HGBA1C 6.1 (H) 04/28/2018   Lab Results  Component Value Date   MICROALBUR 2.2 (H) 11/13/2017

## 2018-05-02 NOTE — Assessment & Plan Note (Signed)
LDL and triglycerides are at goal on atorvastatin 40 mg dose . She has no side effects and liver enzymes are normal. No changes today .  Lab Results  Component Value Date   CHOL 158 04/28/2018   HDL 50 04/28/2018   LDLCALC 83 04/28/2018   LDLDIRECT 111.0 08/13/2016   TRIG 127 04/28/2018   CHOLHDL 3.2 04/28/2018   Lab Results  Component Value Date   ALT 19 04/28/2018   AST 21 04/28/2018   ALKPHOS 105 04/28/2018   BILITOT 0.3 04/28/2018

## 2018-05-02 NOTE — Assessment & Plan Note (Signed)
INR is elevated on 42 mg weekly dose .  Reduce weekly dose to 36 mg weekly (6 mg x 5,  3 mg x 2) and repeat inr in 2 weeks

## 2018-05-02 NOTE — Assessment & Plan Note (Signed)
Managed with tramadol and fioricet prn,  Not daily.    MRI brain and radiographs of cervical spine were done in March 2019 with no obvious cause. Needs sleep study to rule out OSA as cause given progressively harder to control blood pressure.  She has deferred in the past but is willing to undergo testing if BP is not controlled with 2 maximally titrated agents. Adding  Cymbalta for chronic pain management and reduction in anxiety she is experiencing related to husband's condition and COVID 19

## 2018-05-02 NOTE — Assessment & Plan Note (Signed)
Not controlled on telmisartan 80 mg and Toprol XL 75 mg daily.  Increase Toprol to 100 mg .  Will add spironolactone next (prior intolerance to amlodipine due to edema).   Secondary causes suspected .  Will need sleep study and Renal artery dopplers done

## 2018-05-02 NOTE — Assessment & Plan Note (Signed)
Plain films of cervical spine  Showed only  mild degenerative changes of the mid to lower cervical spine with bone spurring  . No alignment issues.. headaches may be due to untreated OSA

## 2018-05-11 ENCOUNTER — Telehealth: Payer: Self-pay

## 2018-05-11 NOTE — Telephone Encounter (Signed)
Appointment has been made

## 2018-05-11 NOTE — Telephone Encounter (Signed)
Copied from [place] [phone]. Topic: Appointment Scheduling - Scheduling Inquiry for Clinic >> May 11, 2018 10:17 AM Reyne Dumas L wrote: Reason for CRM:   Pt called to schedule PT/INR appt.  Tried office 3x.

## 2018-05-12 ENCOUNTER — Other Ambulatory Visit: Payer: Self-pay

## 2018-05-12 ENCOUNTER — Other Ambulatory Visit (INDEPENDENT_AMBULATORY_CARE_PROVIDER_SITE_OTHER): Payer: PPO

## 2018-05-12 DIAGNOSIS — Z7901 Long term (current) use of anticoagulants: Secondary | ICD-10-CM

## 2018-05-12 NOTE — Addendum Note (Signed)
Addended by: Arby Barrette on: 05/12/2018 02:38 PM   Modules accepted: Orders

## 2018-05-13 LAB — PROTIME-INR
INR: 1.6 — ABNORMAL HIGH
Prothrombin Time: 16.6 s — ABNORMAL HIGH (ref 9.0–11.5)

## 2018-05-17 ENCOUNTER — Other Ambulatory Visit: Payer: Self-pay | Admitting: Internal Medicine

## 2018-05-17 MED ORDER — METOPROLOL SUCCINATE ER 100 MG PO TB24: 100.0000 mg | ORAL_TABLET | Freq: Every day | ORAL | 1 refills | Status: DC

## 2018-05-17 NOTE — Telephone Encounter (Signed)
Copied from [place] [phone]. Topic: Quick Communication - Rx Refill/Question >> May 17, 2018 10:04 AM Celene Kras A wrote: Medication: metoprolol succinate (TOPROL-XL) 100 MG 24 hr tablet   Has the patient contacted their pharmacy? Yes.  Pt states the pharmacy has not received request for this medication in 100mg . Pharmacy is requesting that another refill request for the 100mg  prescription be sent over. Pt onoly has one more pill. Please advise.  (Agent: If no, request that the patient contact the pharmacy for the refill.) (Agent: If yes, when and what did the pharmacy advise?)  Preferred Pharmacy (with phone number or street name): [place], [place] - [address] [place] [address] Phone: [phone] Fax: [phone] Not a 24 hour pharmacy; exact hours not known.    Agent: Please be advised that RX refills may take up to 3 business days. We ask that you follow-up with your pharmacy.

## 2018-05-17 NOTE — Telephone Encounter (Signed)
Refill sent as requested. 

## 2018-05-18 MED ORDER — METOPROLOL SUCCINATE ER 100 MG PO TB24: 100.0000 mg | ORAL_TABLET | Freq: Every day | ORAL | 1 refills | Status: DC

## 2018-05-18 NOTE — Telephone Encounter (Signed)
Pt called in stating [facility] did not have RX for metoprolol 100mg . She was almost out so they filled old RX from April for 50mg  self pay. Spoke to [facility] and they confirmed no RX was phoned in. Spoke to [place] at [employer] and she will send in [employer]. Pt has been notified.

## 2018-05-18 NOTE — Telephone Encounter (Signed)
refill sent 

## 2018-05-18 NOTE — Addendum Note (Signed)
Addended by: Nanci Pina on: 05/18/2018 03:24 PM   Modules accepted: Orders

## 2018-05-24 ENCOUNTER — Telehealth: Payer: Self-pay | Admitting: *Deleted

## 2018-05-24 ENCOUNTER — Telehealth: Payer: Self-pay

## 2018-05-24 DIAGNOSIS — Z7901 Long term (current) use of anticoagulants: Secondary | ICD-10-CM

## 2018-05-24 NOTE — Telephone Encounter (Signed)
Standing PT/INR order placed

## 2018-05-24 NOTE — Telephone Encounter (Signed)
Copied from [place] [phone]. Topic: General - Other >> May 24, 2018 11:42 AM Gustavus Messing wrote: Reason for CRM: The patient called to schedule lab work. Tried the scheduling line 3 times. Please call back at [phone]

## 2018-05-26 ENCOUNTER — Other Ambulatory Visit: Payer: Self-pay

## 2018-05-26 ENCOUNTER — Other Ambulatory Visit (INDEPENDENT_AMBULATORY_CARE_PROVIDER_SITE_OTHER): Payer: PPO

## 2018-05-26 DIAGNOSIS — Z7901 Long term (current) use of anticoagulants: Secondary | ICD-10-CM

## 2018-05-26 LAB — PROTIME-INR
INR: 2.7 ratio — ABNORMAL HIGH (ref 0.8–1.0)
Prothrombin Time: 30.5 s — ABNORMAL HIGH (ref 9.6–13.1)

## 2018-06-20 ENCOUNTER — Other Ambulatory Visit: Payer: Self-pay | Admitting: Internal Medicine

## 2018-07-06 ENCOUNTER — Other Ambulatory Visit: Payer: Self-pay | Admitting: Internal Medicine

## 2018-07-06 DIAGNOSIS — E538 Deficiency of other specified B group vitamins: Secondary | ICD-10-CM

## 2018-07-11 ENCOUNTER — Other Ambulatory Visit: Payer: Self-pay | Admitting: Internal Medicine

## 2018-07-12 NOTE — Telephone Encounter (Signed)
Refilled: 03/01/2018 Last OV: 04/29/2018 Next OV: not scheduled

## 2018-07-14 ENCOUNTER — Other Ambulatory Visit: Payer: Self-pay | Admitting: Internal Medicine

## 2018-07-14 ENCOUNTER — Telehealth: Payer: Self-pay

## 2018-07-14 NOTE — Telephone Encounter (Signed)
Copied from [place] [id number]. Topic: Appointment Scheduling - Scheduling Inquiry for Clinic >> Jul 14, 2018 12:11 PM Virl Axe D wrote: Reason for CRM: Pt called to schedule appt for PT/INR. Excessive hold time. Please return call.

## 2018-07-16 ENCOUNTER — Telehealth: Payer: Self-pay | Admitting: Internal Medicine

## 2018-07-16 ENCOUNTER — Other Ambulatory Visit: Payer: Self-pay | Admitting: Internal Medicine

## 2018-07-16 DIAGNOSIS — Z7901 Long term (current) use of anticoagulants: Secondary | ICD-10-CM

## 2018-07-16 NOTE — Telephone Encounter (Signed)
Pt returned call to schedule lab appt. Pt requests call back.

## 2018-07-16 NOTE — Telephone Encounter (Signed)
Returned pt's call to schedule lab appt for PT INR.  No answer.  LMTCB.  There are no future lab orders in patient's chart.  Will forward to scheduler to call pt back.

## 2018-07-16 NOTE — Telephone Encounter (Signed)
Patient is calling to schedule appt with the lab to schedule PT INR appt Please advise CB- [phone]

## 2018-07-16 NOTE — Telephone Encounter (Signed)
LMTCB. Need to schedule pt for a lab appt to have INR checked. Lab has been ordered.

## 2018-07-19 NOTE — Telephone Encounter (Signed)
Pt has been scheduled a lab appt for PT INR on 07/20/18 @ 2:15 pm.

## 2018-07-20 ENCOUNTER — Other Ambulatory Visit: Payer: Self-pay

## 2018-07-20 ENCOUNTER — Other Ambulatory Visit (INDEPENDENT_AMBULATORY_CARE_PROVIDER_SITE_OTHER): Payer: PPO

## 2018-07-20 DIAGNOSIS — Z7901 Long term (current) use of anticoagulants: Secondary | ICD-10-CM | POA: Diagnosis not present

## 2018-07-20 LAB — PROTIME-INR
INR: 3.2 ratio — ABNORMAL HIGH (ref 0.8–1.0)
Prothrombin Time: 36.8 s — ABNORMAL HIGH (ref 9.6–13.1)

## 2018-07-22 ENCOUNTER — Telehealth: Payer: Self-pay

## 2018-07-22 ENCOUNTER — Other Ambulatory Visit: Payer: Self-pay

## 2018-07-22 MED ORDER — ATORVASTATIN CALCIUM 40 MG PO TABS: 40.0000 mg | ORAL_TABLET | Freq: Every day | ORAL | 2 refills | Status: DC

## 2018-07-22 NOTE — Telephone Encounter (Signed)
Confirmed with pt that she is taking her atorvastatin daily

## 2018-07-26 ENCOUNTER — Other Ambulatory Visit: Payer: Self-pay | Admitting: Internal Medicine

## 2018-08-12 ENCOUNTER — Other Ambulatory Visit: Payer: Self-pay

## 2018-08-12 ENCOUNTER — Other Ambulatory Visit (INDEPENDENT_AMBULATORY_CARE_PROVIDER_SITE_OTHER): Payer: PPO

## 2018-08-12 DIAGNOSIS — Z7901 Long term (current) use of anticoagulants: Secondary | ICD-10-CM

## 2018-08-12 LAB — PROTIME-INR
INR: 2.2 ratio — ABNORMAL HIGH (ref 0.8–1.0)
Prothrombin Time: 24.9 s — ABNORMAL HIGH (ref 9.6–13.1)

## 2018-08-26 ENCOUNTER — Other Ambulatory Visit: Payer: Self-pay | Admitting: Internal Medicine

## 2018-08-26 DIAGNOSIS — Z1231 Encounter for screening mammogram for malignant neoplasm of breast: Secondary | ICD-10-CM

## 2018-08-27 ENCOUNTER — Other Ambulatory Visit: Payer: Self-pay

## 2018-08-27 ENCOUNTER — Ambulatory Visit
Admission: RE | Admit: 2018-08-27 | Discharge: 2018-08-27 | Disposition: A | Discharge status: Home / Self Care | Payer: PPO | Source: Ambulatory Visit | Attending: Internal Medicine | Admitting: Internal Medicine

## 2018-08-27 DIAGNOSIS — Z1231 Encounter for screening mammogram for malignant neoplasm of breast: Secondary | ICD-10-CM | POA: Diagnosis not present

## 2018-09-15 ENCOUNTER — Telehealth: Payer: Self-pay | Admitting: *Deleted

## 2018-09-15 NOTE — Telephone Encounter (Unsigned)
Copied from [place] [phone]. Topic: Appointment Scheduling - Scheduling Inquiry for Clinic >> Sep 15, 2018 11:37 AM Mathis Bud wrote: Reason for CRM: Patient is calling to schedule a lab appt, patient has no lab orders to schedule Patient call back [phone]

## 2018-09-16 ENCOUNTER — Other Ambulatory Visit: Payer: PPO

## 2018-09-16 NOTE — Telephone Encounter (Signed)
Pt has been scheduled.  Nothing further needed. 

## 2018-09-17 ENCOUNTER — Other Ambulatory Visit (INDEPENDENT_AMBULATORY_CARE_PROVIDER_SITE_OTHER): Payer: PPO

## 2018-09-17 ENCOUNTER — Other Ambulatory Visit: Payer: Self-pay

## 2018-09-17 DIAGNOSIS — Z23 Encounter for immunization: Secondary | ICD-10-CM | POA: Diagnosis not present

## 2018-09-17 DIAGNOSIS — Z7901 Long term (current) use of anticoagulants: Secondary | ICD-10-CM

## 2018-09-17 LAB — PROTIME-INR
INR: 1.7 ratio — ABNORMAL HIGH (ref 0.8–1.0)
Prothrombin Time: 19.6 s — ABNORMAL HIGH (ref 9.6–13.1)

## 2018-09-20 ENCOUNTER — Other Ambulatory Visit: Payer: Self-pay | Admitting: Internal Medicine

## 2018-09-20 DIAGNOSIS — Z7901 Long term (current) use of anticoagulants: Secondary | ICD-10-CM

## 2018-09-20 MED ORDER — WARFARIN SODIUM 1 MG PO TABS: 1.0000 mg | ORAL_TABLET | Freq: Every day | ORAL | 5 refills | Status: DC

## 2018-09-28 ENCOUNTER — Other Ambulatory Visit: Payer: Self-pay | Admitting: Internal Medicine

## 2018-09-28 DIAGNOSIS — F409 Phobic anxiety disorder, unspecified: Secondary | ICD-10-CM

## 2018-09-28 DIAGNOSIS — F5105 Insomnia due to other mental disorder: Secondary | ICD-10-CM

## 2018-10-04 ENCOUNTER — Other Ambulatory Visit (INDEPENDENT_AMBULATORY_CARE_PROVIDER_SITE_OTHER): Payer: PPO

## 2018-10-04 ENCOUNTER — Other Ambulatory Visit: Payer: Self-pay

## 2018-10-04 DIAGNOSIS — Z7901 Long term (current) use of anticoagulants: Secondary | ICD-10-CM | POA: Diagnosis not present

## 2018-10-04 LAB — PROTIME-INR
INR: 2.7 ratio — ABNORMAL HIGH (ref 0.8–1.0)
Prothrombin Time: 31 s — ABNORMAL HIGH (ref 9.6–13.1)

## 2018-11-11 ENCOUNTER — Other Ambulatory Visit: Payer: Self-pay

## 2018-11-12 ENCOUNTER — Other Ambulatory Visit: Payer: Self-pay | Admitting: Internal Medicine

## 2018-11-15 ENCOUNTER — Other Ambulatory Visit: Payer: Self-pay

## 2018-11-15 ENCOUNTER — Other Ambulatory Visit (INDEPENDENT_AMBULATORY_CARE_PROVIDER_SITE_OTHER): Payer: PPO

## 2018-11-15 DIAGNOSIS — Z7901 Long term (current) use of anticoagulants: Secondary | ICD-10-CM | POA: Diagnosis not present

## 2018-11-15 LAB — PROTIME-INR
INR: 3.8 ratio — ABNORMAL HIGH (ref 0.8–1.0)
Prothrombin Time: 43.3 s — ABNORMAL HIGH (ref 9.6–13.1)

## 2018-11-15 NOTE — Addendum Note (Signed)
Addended by: Leeanne Rio on: 11/15/2018 02:26 PM   Modules accepted: Orders

## 2018-11-18 ENCOUNTER — Other Ambulatory Visit: Payer: Self-pay | Admitting: Internal Medicine

## 2018-11-18 DIAGNOSIS — E119 Type 2 diabetes mellitus without complications: Secondary | ICD-10-CM

## 2018-11-18 DIAGNOSIS — E1169 Type 2 diabetes mellitus with other specified complication: Secondary | ICD-10-CM

## 2018-11-18 DIAGNOSIS — E785 Hyperlipidemia, unspecified: Secondary | ICD-10-CM

## 2018-11-18 DIAGNOSIS — Z7901 Long term (current) use of anticoagulants: Secondary | ICD-10-CM

## 2018-11-18 NOTE — Progress Notes (Signed)
Lab Results  Component Value Date   HGBA1C 6.1 (H) 04/28/2018

## 2018-11-19 ENCOUNTER — Other Ambulatory Visit: Payer: Self-pay | Admitting: Internal Medicine

## 2018-11-19 NOTE — Telephone Encounter (Signed)
Spoke to patient by phone.  See lab result note.

## 2018-11-26 ENCOUNTER — Encounter: Payer: Self-pay | Admitting: Internal Medicine

## 2018-11-26 ENCOUNTER — Ambulatory Visit (INDEPENDENT_AMBULATORY_CARE_PROVIDER_SITE_OTHER): Payer: PPO | Admitting: Internal Medicine

## 2018-11-26 ENCOUNTER — Other Ambulatory Visit: Payer: Self-pay

## 2018-11-26 VITALS — BP 165/93 | Ht 61.0 in | Wt 160.0 lb

## 2018-11-26 DIAGNOSIS — Z7901 Long term (current) use of anticoagulants: Secondary | ICD-10-CM | POA: Diagnosis not present

## 2018-11-26 DIAGNOSIS — I10 Essential (primary) hypertension: Secondary | ICD-10-CM

## 2018-11-26 DIAGNOSIS — R079 Chest pain, unspecified: Secondary | ICD-10-CM | POA: Diagnosis not present

## 2018-11-26 DIAGNOSIS — E119 Type 2 diabetes mellitus without complications: Secondary | ICD-10-CM

## 2018-11-26 MED ORDER — NITROGLYCERIN 0.4 MG SL SUBL: 0.4000 mg | SUBLINGUAL_TABLET | SUBLINGUAL | 3 refills | Status: DC | PRN

## 2018-11-26 MED ORDER — AMLODIPINE BESYLATE 2.5 MG PO TABS: 2.5000 mg | ORAL_TABLET | Freq: Every day | ORAL | 3 refills | Status: DC

## 2018-11-26 MED ORDER — ONETOUCH DELICA LANCETS 33G MISC: 5 refills | Status: DC

## 2018-11-26 MED ORDER — GLUCOSE BLOOD VI STRP: ORAL_STRIP | 5 refills | Status: DC

## 2018-11-26 NOTE — Progress Notes (Signed)
Telephone Note  This visit type was conducted due to national recommendations for restrictions regarding the COVID-19 pandemic (e.g. social distancing).  This format is felt to be most appropriate for this patient at this time.  All issues noted in this document were discussed and addressed.  No physical exam was performed (except for noted visual exam findings with Video Visits).   I connected with@ on 11/26/18 at  4:00 PM EST by telephone and verified that I am speaking with the correct person using two identifiers. Location patient: home Location provider: work or home office Persons participating in the virtual visit: patient, provider  I discussed the limitations, risks, security and privacy concerns of performing an evaluation and management service by telephone and the availability of in person appointments. I also discussed with the patient that there may be a patient responsible charge related to this service. The patient expressed understanding and agreed to proceed.  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  occasionally accompanied by nausea and shortness of breath. Usually takes alprazolam and when she does, it  resolves in 3 minutes.    Bps have been elevated 165/93 pulse 61    On Monday 206/108,  186/103  Headaches have been worse, occurring persistently on the top of her head.   Cites increased emotional stressors.  Husband has been declining  rapidly due to diagnosis of Progressive supranuclear palsy .  She has stopped cymbalta due to fatigue    Lab Results  Component Value Date   INR 3.8 (H) 11/15/2018   INR 2.7 (H) 10/04/2018   INR 1.7 (H) 09/17/2018   PROTIME 57.9 (A) 02/17/2014     Lab Results  Component Value Date   HGBA1C 6.1 (H)  04/28/2018     ROS: See pertinent positives and negatives per HPI.  Past Medical History:  Diagnosis Date  . Diabetes mellitus without complication ([place])   . H/O toxic shock syndrome 2001   post urologic procedure for stone removal  . 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 Hyperlipidemia   . Hypertension   . Ischemic colitis ([place]) 2012-2013   s/p left colectomy May 2013  . Kidney stones     Past Surgical History:  Procedure Laterality Date  . ABDOMINAL HYSTERECTOMY  1996  . APPENDECTOMY    . bilateral tubal ligation    . BREAST EXCISIONAL BIOPSY Left 1989   benign  . BREAST SURGERY  1989   left biopsy, normal  . COLECTOMY Left May 2013   [place], ischemic colitis  . COLONOSCOPY WITH PROPOFOL N/A 11/16/2014   Procedure: COLONOSCOPY WITH PROPOFOL;  Surgeon: Manya Silvas, MD;  Location: [facility] ENDOSCOPY;  Service: Endoscopy;  Laterality: N/A;  . ESOPHAGOGASTRODUODENOSCOPY (EGD) WITH PROPOFOL N/A 11/16/2014   Procedure: ESOPHAGOGASTRODUODENOSCOPY (EGD) WITH PROPOFOL;  Surgeon: Manya Silvas, MD;  Location: [facility] ENDOSCOPY;  Service: Endoscopy;  Laterality: N/A;  . SPINE SURGERY  1984   lumbar diskectomy, [place]  . ureterolithotomy      Family History  Problem Relation Age of Onset  . Hypertension Mother   . Cancer Mother   . Diabetes Mother   . Heart disease Father   .  Hypertension Father   . Diabetes Father   . Breast cancer Paternal Aunt        <50  . Breast cancer Cousin   . Alzheimer's disease Sister   . Breast cancer Sister 53       DCIS    SOCIAL HX:  reports that she has never smoked. She has never used smokeless tobacco. She reports that she does not drink alcohol or use drugs.   Current Outpatient Medications:  .  ALPRAZolam (XANAX) 0.25 MG tablet, TAKE 1 TABLET BY MOUTH AT BEDTIME AS NEEDED FOR ANXIETY, Disp: 30 tablet, Rfl: 2 .  aspirin 81 MG tablet, Take 81 mg by mouth daily., Disp: , Rfl:   .  atorvastatin (LIPITOR) 40 MG tablet, Take 1 tablet (40 mg total) by mouth daily., Disp: 90 tablet, Rfl: 2 .  butalbital-acetaminophen-caffeine (FIORICET) 50-325-40 MG tablet, TAKE 1 TABLET BY MOUTH EVERY 6 HOURS AS NEEDED FOR HEADACHE, Disp: 60 tablet, Rfl: 0 .  Cholecalciferol (VITAMIN D3) 1000 UNITS CAPS, Take by mouth., Disp: , Rfl:  .  cyanocobalamin (,VITAMIN B-12,) 1000 MCG/ML injection, INJECT 1 ML (1,000 MCG TOTAL) INTO THE MUSCLE EVERY 30 DAYS., Disp: 5 mL, Rfl: 0 .  DULoxetine (CYMBALTA) 20 MG capsule, Take 1 capsule by mouth once daily, Disp: 90 capsule, Rfl: 0 .  imipramine (TOFRANIL) 25 MG tablet, TAKE 1 TABLET BY MOUTH AT BEDTIME, Disp: 90 tablet, Rfl: 0 .  metoprolol succinate (TOPROL-XL) 100 MG 24 hr tablet, TAKE 1 TABLET BY MOUTH AT BEDTIME, Disp: 90 tablet, Rfl: 0 .  Omega-3 Fatty Acids (FISH OIL) 1000 MG CAPS, Take 1 capsule daily by mouth., Disp: , Rfl:  .  omeprazole (PRILOSEC) 20 MG capsule, Take 20 mg by mouth daily., Disp: , Rfl:  .  Syringe/Needle, Disp, (SYRINGE 3CC/25GX1") 25G X 1" 3 ML MISC, Use for b12 injections, Disp: 50 each, Rfl: 0 .  telmisartan (MICARDIS) 80 MG tablet, Take 1 tablet by mouth once daily, Disp: 90 tablet, Rfl: 0 .  traMADol (ULTRAM) 50 MG tablet, Take 1 tablet (50 mg total) by mouth every 8 (eight) hours as needed., Disp: 90 tablet, Rfl: 4 .  warfarin (COUMADIN) 6 MG tablet, Take 1 tablet by mouth once daily, Disp: 90 tablet, Rfl: 0 .  amLODipine (NORVASC) 2.5 MG tablet, Take 1 tablet (2.5 mg total) by mouth daily., Disp: 90 tablet, Rfl: 3 .  furosemide (LASIX) 20 MG tablet, Take 1 tablet by mouth once daily (Patient not taking: Reported on 11/26/2018), Disp: 90 tablet, Rfl: 0 .  glucose blood test strip, Use to check blood sugar once daily., Disp: 100 each, Rfl: 5 .  nitroGLYCERIN (NITROSTAT) 0.4 MG SL tablet, Place 1 tablet (0.4 mg total) under the tongue every 5 (five) minutes as needed for chest pain., Disp: 50 tablet, Rfl: 3 .  OneTouch  Delica Lancets [id number] MISC, Use to check blood sugar once daily., Disp: 100 each, Rfl: 5 .  warfarin (COUMADIN) 1 MG tablet, Take 1 tablet (1 mg total) by mouth daily. With 6 mg (Patient not taking: Reported on 11/26/2018), Disp: 30 tablet, Rfl: 5  Current Facility-Administered Medications:  .  cyanocobalamin ((VITAMIN B-12)) injection 1,000 mcg, 1,000 mcg, Intramuscular, Once, Crecencio Mc, MD  EXAM:   General impression: alert, cooperative and articulate.  No signs of being in distress  Lungs: speech is fluent sentence length suggests that patient is not short of breath and not punctuated by cough, sneezing or sniffing. Marland Kitchen   Psych: affect  normal.  speech is articulate and non pressured .  Denies suicidal thoughts   ASSESSMENT AND PLAN:  Discussed the following assessment and plan:  Long term current use of anticoagulant therapy - Plan: INR/PT  Chest pain, unspecified type - Plan: Ambulatory referral to Cardiology  Essential hypertension  Diabetes mellitus type 2, diet-controlled ([place]) - Plan: Hemoglobin A1c, Lipid panel, Comprehensive metabolic panel  Chest pain SS,  described as tightness,  At times heavy,  accompanied  by diaphoresis and nausea,  ocurring with exertion and with emotional stress.    She is anticoagulated,  On beta blocker ad ARB,  Adding prn NTG and cardiology consult in progress   Hypertension Restarting amlodipine at  2.5 mg daily   Diabetes mellitus type 2, diet-controlled ([place]) She is following a  low glycemic index diet and her diabetes has been  diet controlled.   A1c is due for repeat  She is taking  a baby  aspirin and atorvastatin, and telmisartan  daily   Lab Results  Component Value Date   HGBA1C 6.1 (H) 04/28/2018   Lab Results  Component Value Date   MICROALBUR 2.2 (H) 11/13/2017       I discussed the assessment and treatment plan with the patient. The patient was provided an opportunity to ask questions and all were answered. The patient  agreed with the plan and demonstrated an understanding of the instructions.   The patient was advised to call back or seek an in-person evaluation if the symptoms worsen or if the condition fails to improve as anticipated.  I provided  25 minutes of non-face-to-face time during this encounter reviewing patient's current problems and post surgeries.  Providing counseling on the above mentioned problems , and coordination  of care .  Crecencio Mc, MD

## 2018-11-26 NOTE — Assessment & Plan Note (Signed)
SS,  described as tightness,  At times heavy,  accompanied  by diaphoresis and nausea,  ocurring with exertion and with emotional stress.    She is anticoagulated,  On beta blocker ad ARB,  Adding prn NTG and cardiology consult in progress

## 2018-11-28 NOTE — Assessment & Plan Note (Signed)
She is following a  low glycemic index diet and her diabetes has been  diet controlled.   A1c is due for repeat  She is taking  a baby  aspirin and atorvastatin, and telmisartan  daily   Lab Results  Component Value Date   HGBA1C 6.1 (H) 04/28/2018   Lab Results  Component Value Date   MICROALBUR 2.2 (H) 11/13/2017

## 2018-11-28 NOTE — Assessment & Plan Note (Signed)
Restarting amlodipine at  2.5 mg daily

## 2018-11-30 ENCOUNTER — Other Ambulatory Visit (INDEPENDENT_AMBULATORY_CARE_PROVIDER_SITE_OTHER): Payer: PPO

## 2018-11-30 ENCOUNTER — Other Ambulatory Visit: Payer: Self-pay

## 2018-11-30 DIAGNOSIS — Z7901 Long term (current) use of anticoagulants: Secondary | ICD-10-CM

## 2018-11-30 DIAGNOSIS — E119 Type 2 diabetes mellitus without complications: Secondary | ICD-10-CM

## 2018-11-30 LAB — COMPREHENSIVE METABOLIC PANEL
ALT: 18 U/L (ref 0–35)
AST: 18 U/L (ref 0–37)
Albumin: 4.1 g/dL (ref 3.5–5.2)
Alkaline Phosphatase: 101 U/L (ref 39–117)
BUN: 18 mg/dL (ref 6–23)
CO2: 31 mEq/L (ref 19–32)
Calcium: 8.9 mg/dL (ref 8.4–10.5)
Chloride: 102 mEq/L (ref 96–112)
Creatinine, Ser: 0.91 mg/dL (ref 0.40–1.20)
GFR: 61.18 mL/min (ref >60.00)
Glucose, Bld: 99 mg/dL (ref 70–99)
Potassium: 4.5 mEq/L (ref 3.5–5.1)
Sodium: 138 mEq/L (ref 135–145)
Total Bilirubin: 0.7 mg/dL (ref 0.2–1.2)
Total Protein: 6.4 g/dL (ref 6.0–8.3)

## 2018-11-30 LAB — LIPID PANEL
Cholesterol: 184 mg/dL (ref 0–200)
HDL: 51.1 mg/dL (ref >39.00)
LDL Cholesterol: 102 mg/dL — ABNORMAL HIGH (ref 0–99)
NonHDL: 133.3
Total CHOL/HDL Ratio: 4
Triglycerides: 155 mg/dL — ABNORMAL HIGH (ref 0.0–149.0)
VLDL: 31 mg/dL (ref 0.0–40.0)

## 2018-11-30 LAB — PROTIME-INR
INR: 1.4 ratio — ABNORMAL HIGH (ref 0.8–1.0)
Prothrombin Time: 16.5 s — ABNORMAL HIGH (ref 9.6–13.1)

## 2018-11-30 LAB — HEMOGLOBIN A1C: Hgb A1c MFr Bld: 6.3 % (ref 4.6–6.5)

## 2018-12-10 ENCOUNTER — Encounter: Payer: Self-pay | Admitting: Cardiology

## 2018-12-10 ENCOUNTER — Other Ambulatory Visit: Payer: Self-pay

## 2018-12-10 ENCOUNTER — Ambulatory Visit (INDEPENDENT_AMBULATORY_CARE_PROVIDER_SITE_OTHER): Payer: PPO | Admitting: Cardiology

## 2018-12-10 VITALS — BP 150/82 | HR 62 | Temp 97.2°F | Ht 61.5 in | Wt 164.5 lb

## 2018-12-10 DIAGNOSIS — R079 Chest pain, unspecified: Secondary | ICD-10-CM

## 2018-12-10 DIAGNOSIS — E7800 Pure hypercholesterolemia, unspecified: Secondary | ICD-10-CM

## 2018-12-10 DIAGNOSIS — I10 Essential (primary) hypertension: Secondary | ICD-10-CM | POA: Diagnosis not present

## 2018-12-10 NOTE — Progress Notes (Signed)
Cardiology Office Note:    Date:  12/10/2018   ID:  [name], DOB [birth year], MRN [id number]  PCP:  Emma Mc, MD  Cardiologist:  Emma Sable, MD  Electrophysiologist:  None   Referring MD: Emma Mc, MD   Chief Complaint  Patient presents with  . New Patient (Initial Visit)    Intermittent non-radiating chest pain; Meds verbally reviewed with patient.    History of Present Illness:    [name] is a 68 y.o. female with a hx of hypertension, hyperlipidemia, protein C deficiency, PE who presents due to chest pain.  Patient states having chest pain for about 3 months now.  Symptoms typically occur 2-3 times a week.  Symptoms sometimes associated with exertion such as blowing leaves around the house, doing dishes.  Occasionally or because while she is reading a book in bed.  She rates symptoms as a 6 out of 10 in severity and describes it as a chest tightness.  Symptoms typically last 10 to 15 minutes and occasionally associated with mild shortness of breath and palpitations.  In 2013, patient was diagnosed with ischemic colitis.  While she was waiting to get surgery, she had severe shortness of breath.  She went to the hospital and was diagnosed with pulmonary embolus.  She eventually had work-up revealing protein C deficiency.  She was placed on Coumadin and has been taking that since.  Patient also has poorly controlled hypertension.  She was recently started on amlodipine 2.5 mg daily with improvement in blood pressures from 180s to 150s.  She denies smoking, her father had an MI at age 22.  Past Medical History:  Diagnosis Date  . Diabetes mellitus without complication ([place])   . H/O toxic shock syndrome 2001   post urologic procedure for stone removal  . 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 Hyperlipidemia   . Hypertension   . Ischemic colitis ([place]) 2012-2013   s/p left colectomy May 2013  .  Kidney stones     Past Surgical History:  Procedure Laterality Date  . ABDOMINAL HYSTERECTOMY  1996  . APPENDECTOMY    . bilateral tubal ligation    . BREAST EXCISIONAL BIOPSY Left 1989   benign  . BREAST SURGERY  1989   left biopsy, normal  . COLECTOMY Left May 2013   [place], ischemic colitis  . COLONOSCOPY WITH PROPOFOL N/A 11/16/2014   Procedure: COLONOSCOPY WITH PROPOFOL;  Surgeon: Manya Silvas, MD;  Location: [facility] ENDOSCOPY;  Service: Endoscopy;  Laterality: N/A;  . ESOPHAGOGASTRODUODENOSCOPY (EGD) WITH PROPOFOL N/A 11/16/2014   Procedure: ESOPHAGOGASTRODUODENOSCOPY (EGD) WITH PROPOFOL;  Surgeon: Manya Silvas, MD;  Location: [facility] ENDOSCOPY;  Service: Endoscopy;  Laterality: N/A;  . SPINE SURGERY  1984   lumbar diskectomy, [place]  . ureterolithotomy      Current Medications: Current Meds  Medication Sig  . ALPRAZolam (XANAX) 0.25 MG tablet TAKE 1 TABLET BY MOUTH AT BEDTIME AS NEEDED FOR ANXIETY  . amLODipine (NORVASC) 2.5 MG tablet Take 1 tablet (2.5 mg total) by mouth daily.  Marland Kitchen aspirin 81 MG tablet Take 81 mg by mouth daily.  Marland Kitchen atorvastatin (LIPITOR) 40 MG tablet Take 1 tablet (40 mg total) by mouth daily.  . butalbital-acetaminophen-caffeine (FIORICET) 50-325-40 MG tablet TAKE 1 TABLET BY MOUTH EVERY 6 HOURS AS NEEDED FOR HEADACHE  . Cholecalciferol (VITAMIN D3) 1000 UNITS CAPS Take by mouth.  . cyanocobalamin (,VITAMIN  B-12,) 1000 MCG/ML injection INJECT 1 ML (1,000 MCG TOTAL) INTO THE MUSCLE EVERY 30 DAYS.  . DULoxetine (CYMBALTA) 20 MG capsule Take 1 capsule by mouth once daily  . furosemide (LASIX) 20 MG tablet Take 1 tablet by mouth once daily  . glucose blood test strip Use to check blood sugar once daily.  Marland Kitchen imipramine (TOFRANIL) 25 MG tablet TAKE 1 TABLET BY MOUTH AT BEDTIME  . metoprolol succinate (TOPROL-XL) 100 MG 24 hr tablet TAKE 1 TABLET BY MOUTH AT BEDTIME  . nitroGLYCERIN (NITROSTAT) 0.4 MG SL tablet Place 1 tablet (0.4 mg total) under the tongue every  5 (five) minutes as needed for chest pain.  . Omega-3 Fatty Acids (FISH OIL) 1000 MG CAPS Take 1 capsule daily by mouth.  Marland Kitchen omeprazole (PRILOSEC) 20 MG capsule Take 20 mg by mouth daily.  Glory Rosebush Delica Lancets 99991111 MISC Use to check blood sugar once daily.  . Syringe/Needle, Disp, (SYRINGE 3CC/25GX1") 25G X 1" 3 ML MISC Use for b12 injections  . telmisartan (MICARDIS) 80 MG tablet Take 1 tablet by mouth once daily  . traMADol (ULTRAM) 50 MG tablet Take 1 tablet (50 mg total) by mouth every 8 (eight) hours as needed.  . warfarin (COUMADIN) 1 MG tablet Take 1 tablet (1 mg total) by mouth daily. With 6 mg  . warfarin (COUMADIN) 6 MG tablet Take 1 tablet by mouth once daily   Current Facility-Administered Medications for the 12/10/18 encounter (Office Visit) with Emma Sable, MD  Medication  . cyanocobalamin ((VITAMIN B-12)) injection 1,000 mcg     Allergies:   Patient has no known allergies.   Social History   Socioeconomic History  . Marital status: Married    Spouse name: Not on file  . Number of children: Not on file  . Years of education: Not on file  . Highest education level: Not on file  Occupational History  . Not on file  Social Needs  . Financial resource strain: Not hard at all  . Food insecurity    Worry: Never true    Inability: Never true  . Transportation needs    Medical: No    Non-medical: No  Tobacco Use  . Smoking status: Never Smoker  . Smokeless tobacco: Never Used  Substance and Sexual Activity  . Alcohol use: No  . Drug use: No  . Sexual activity: Yes  Lifestyle  . Physical activity    Days per week: Not on file    Minutes per session: Not on file  . Stress: Rather much  Relationships  . Social Herbalist on phone: Not on file    Gets together: Not on file    Attends religious service: Not on file    Active member of club or organization: Not on file    Attends meetings of clubs or organizations: Not on file    Relationship  status: Not on file  Other Topics Concern  . Not on file  Social History Narrative  . Not on file     Family History: The patient's family history includes Alzheimer's disease in her sister; Breast cancer in her cousin and paternal aunt; Breast cancer (age of onset: 18) in her sister; Cancer in her mother; Diabetes in her father and mother; Heart disease in her father; Hypertension in her father and mother.  ROS:   Please see the history of present illness.     All other systems reviewed and are negative.  EKGs/Labs/Other Studies  Reviewed:    The following studies were reviewed today:   EKG:  EKG is  ordered today.  The ekg ordered today demonstrates normal sinus rhythm, normal ECG.  Recent Labs: 11/30/2018: ALT 18; BUN 18; Creatinine, Ser 0.91; Potassium 4.5; Sodium 138  Recent Lipid Panel    Component Value Date/Time   CHOL 184 11/30/2018 0931   CHOL 158 04/28/2018 0905   TRIG 155.0 (H) 11/30/2018 0931   HDL 51.10 11/30/2018 0931   HDL 50 04/28/2018 0905   CHOLHDL 4 11/30/2018 0931   VLDL 31.0 11/30/2018 0931   LDLCALC 102 (H) 11/30/2018 0931   LDLCALC 83 04/28/2018 0905   LDLCALC 101 (H) 11/21/2016 1549   LDLDIRECT 111.0 08/13/2016 1046    Physical Exam:    VS:  BP (!) 150/82 (BP Location: Right Arm, Patient Position: Sitting, Cuff Size: Normal)   Pulse 62   Temp (!) 97.2 F (36.2 C)   Ht 5' 1.5" (1.562 m)   Wt 164 lb 8 oz (74.6 kg)   SpO2 96%   BMI 30.58 kg/m     Wt Readings from Last 3 Encounters:  12/10/18 164 lb 8 oz (74.6 kg)  11/26/18 160 lb (72.6 kg)  02/09/18 159 lb 6.4 oz (72.3 kg)     GEN:  Well nourished, well developed in no acute distress HEENT: Normal NECK: No JVD; No carotid bruits LYMPHATICS: No lymphadenopathy CARDIAC: RRR, no murmurs, rubs, gallops RESPIRATORY:  Clear to auscultation without rales, wheezing or rhonchi  ABDOMEN: Soft, non-tender, non-distended MUSCULOSKELETAL:  No edema; No deformity  SKIN: Warm and dry NEUROLOGIC:   Alert and oriented x 3 PSYCHIATRIC:  Normal affect   ASSESSMENT:   Patient with atypical chest pain.  Has risk factors of hypertension, hyperlipidemia, family history of CAD in father. 1. Chest pain of uncertain etiology   2. Essential hypertension   3. Pure hypercholesterolemia    PLAN:    In order of problems listed above:  1. Get echocardiogram, get coronary CTA. 2. Blood pressure elevated but improving.  Continue Toprol-XL and amlodipine 2.5 mg daily.  If blood pressure stays elevated on follow-up visit, plan to increase amlodipine. 3. Continue Lipitor.  Follow-up in 2 months.  This note was generated in part or whole with voice recognition software. Voice recognition is usually quite accurate but there are transcription errors that can and very often do occur. I apologize for any typographical errors that were not detected and corrected.  Medication Adjustments/Labs and Tests Ordered: Current medicines are reviewed at length with the patient today.  Concerns regarding medicines are outlined above.  Orders Placed This Encounter  Procedures  . CT CORONARY MORPH W/CTA COR W/SCORE W/CA W/CM &/OR WO/CM  . CT CORONARY FRACTIONAL FLOW RESERVE DATA PREP  . CT CORONARY FRACTIONAL FLOW RESERVE FLUID ANALYSIS  . EKG 12-Lead  . ECHOCARDIOGRAM COMPLETE   No orders of the defined types were placed in this encounter.   Patient Instructions  Medication Instructions:  - Your physician recommends that you continue on your current medications as directed. Please refer to the Current Medication list given to you today.  *If you need a refill on your cardiac medications before your next appointment, please call your pharmacy*  Lab Work: - none ordered  If you have labs (blood work) drawn today and your tests are completely normal, you will receive your results only by: Marland Kitchen MyChart Message (if you have MyChart) OR . A paper copy in the mail If you have any  lab test that is abnormal or we  need to change your treatment, we will call you to review the results.  Testing/Procedures: - Your physician has requested that you have an echocardiogram. Echocardiography is a painless test that uses sound waves to create images of your heart. It provides your doctor with information about the size and shape of your heart and how well your heart's chambers and valves are working. This procedure takes approximately one hour. There are no restrictions for this procedure.  - Your physician has requested that you have cardiac CT. Cardiac computed tomography (CT) is a painless test that uses an x-ray machine to take clear, detailed pictures of your heart.   Your cardiac CT will be scheduled at one of the below locations:   [facility] [address] [place], [place] [address] (336) [place] [address] [place], [place] [address] [phone]  If scheduled at [facility], please arrive at the [facility] main entrance of [facility] 30-45 minutes prior to test start time. Proceed to the [facility] Radiology Department (first floor) to check-in and test prep.  If scheduled at [facility], please arrive 15 mins early for check-in and test prep.  Please follow these instructions carefully (unless otherwise directed):  Hold all erectile dysfunction medications at least 3 days (72 hrs) prior to test.  On the Night Before the Test: . Be sure to Drink plenty of water. . Do not consume any caffeinated/decaffeinated beverages or chocolate 12 hours prior to your test. . Do not take any antihistamines 12 hours prior to your test.  On the Day of the Test: . Drink plenty of water. Do not drink any water within one hour of the test. . Do not eat any food 4 hours prior to the test. . You may take your regular medications prior to the test.  . Take metoprolol two hours prior to  test. . HOLD Furosemide/Hydrochlorothiazide morning of the test. . FEMALES- please wear underwire-free bra if available       After the Test: . Drink plenty of water. . After receiving IV contrast, you may experience a mild flushed feeling. This is normal. . On occasion, you may experience a mild rash up to 24 hours after the test. This is not dangerous. If this occurs, you can take Benadryl 25 mg and increase your fluid intake. . If you experience trouble breathing, this can be serious. If it is severe call 911 IMMEDIATELY. If it is mild, please call our office. . If you take any of these medications: Glipizide/Metformin, Avandament, Glucavance, please do not take 48 hours after completing test unless otherwise instructed.   Once we have confirmed authorization from your insurance company, we will call you to set up a date and time for your test.   For non-scheduling related questions, please contact the cardiac imaging nurse navigator should you have any questions/concerns: Marchia Bond, RN Navigator Cardiac Imaging [facility] [phone] Office   Follow-Up: At [facility], you and your health needs are our priority.  As part of our continuing mission to provide you with exceptional heart care, we have created designated Provider Care Teams.  These Care Teams include your primary Cardiologist (physician) and Advanced Practice Providers (APPs -  Physician Assistants and Nurse Practitioners) who all work together to provide you with the care you need, when you need it.  Your  next appointment:   2 month(s)  The format for your next appointment:   In Person  Provider:   Kate Sable, MD  Other Instructions N/a   Cardiac CT Angiogram  A cardiac CT angiogram is a procedure to look at the heart and the area around the heart. It may be done to help find the cause of chest pains or other symptoms of heart disease. During this procedure, a large  X-ray machine, called a CT scanner, takes detailed pictures of the heart and the surrounding area after a dye (contrast material) has been injected into blood vessels in the area. The procedure is also sometimes called a coronary CT angiogram, coronary artery scanning, or CTA. A cardiac CT angiogram allows the health care provider to see how well blood is flowing to and from the heart. The health care provider will be able to see if there are any problems, such as: Blockage or narrowing of the coronary arteries in the heart. Fluid around the heart. Signs of weakness or disease in the muscles, valves, and tissues of the heart. Tell a health care provider about: Any allergies you have. This is especially important if you have had a previous allergic reaction to contrast dye. All medicines you are taking, including vitamins, herbs, eye drops, creams, and over-the-counter medicines. Any blood disorders you have. Any surgeries you have had. Any medical conditions you have. Whether you are pregnant or may be pregnant. Any anxiety disorders, chronic pain, or other conditions you have that may increase your stress or prevent you from lying still. What are the risks? Generally, this is a safe procedure. However, problems may occur, including: Bleeding. Infection. Allergic reactions to medicines or dyes. Damage to other structures or organs. Kidney damage from the dye or contrast that is used. Increased risk of cancer from radiation exposure. This risk is low. Talk with your health care provider about: The risks and benefits of testing. How you can receive the lowest dose of radiation. What happens before the procedure? Wear comfortable clothing and remove any jewelry, glasses, dentures, and hearing aids. Follow instructions from your health care provider about eating and drinking. This may include: For 12 hours before the test - avoid caffeine. This includes tea, coffee, soda, energy drinks, and  diet pills. Drink plenty of water or other fluids that do not have caffeine in them. Being well-hydrated can prevent complications. For 4-6 hours before the test - stop eating and drinking. The contrast dye can cause nausea, but this is less likely if your stomach is empty. Ask your health care provider about changing or stopping your regular medicines. This is especially important if you are taking diabetes medicines, blood thinners, or medicines to treat erectile dysfunction. What happens during the procedure? Hair on your chest may need to be removed so that small sticky patches called electrodes can be placed on your chest. These will transmit information that helps to monitor your heart during the test. An IV tube will be inserted into one of your veins. You might be given a medicine to control your heart rate during the test. This will help to ensure that good images are obtained. You will be asked to lie on an exam table. This table will slide in and out of the CT machine during the procedure. Contrast dye will be injected into the IV tube. You might feel warm, or you may get a metallic taste in your mouth. You will be given a medicine (nitroglycerin) to relax (dilate)  the arteries in your heart. The table that you are lying on will move into the CT machine tunnel for the scan. The person running the machine will give you instructions while the scans are being done. You may be asked to: Keep your arms above your head. Hold your breath. Stay very still, even if the table is moving. When the scanning is complete, you will be moved out of the machine. The IV tube will be removed. The procedure may vary among health care providers and hospitals. What happens after the procedure? You might feel warm, or you may get a metallic taste in your mouth from the contrast dye. You may have a headache from the nitroglycerin. After the procedure, drink water or other fluids to wash (flush) the contrast  material out of your body. Contact a health care provider if you have any symptoms of allergy to the contrast. These symptoms include: Shortness of breath. Rash or hives. A racing heartbeat. Most people can return to their normal activities right after the procedure. Ask your health care provider what activities are safe for you. It is up to you to get the results of your procedure. Ask your health care provider, or the department that is doing the procedure, when your results will be ready. Summary A cardiac CT angiogram is a procedure to look at the heart and the area around the heart. It may be done to help find the cause of chest pains or other symptoms of heart disease. During this procedure, a large X-ray machine, called a CT scanner, takes detailed pictures of the heart and the surrounding area after a dye (contrast material) has been injected into blood vessels in the area. Ask your health care provider about changing or stopping your regular medicines before the procedure. This is especially important if you are taking diabetes medicines, blood thinners, or medicines to treat erectile dysfunction. After the procedure, drink water or other fluids to wash (flush) the contrast material out of your body. This information is not intended to replace advice given to you by your health care provider. Make sure you discuss any questions you have with your health care provider. Document Released: 12/06/2007 Document Revised: 12/05/2016 Document Reviewed: 11/12/2015 Elsevier Patient Education  [place].    Echocardiogram An echocardiogram is a procedure that uses painless sound waves (ultrasound) to produce an image of the heart. Images from an echocardiogram can provide important information about:  Signs of coronary artery disease (CAD).  Aneurysm detection. An aneurysm is a weak or damaged part of an artery wall that bulges out from the normal force of blood pumping through the  body.  Heart size and shape. Changes in the size or shape of the heart can be associated with certain conditions, including heart failure, aneurysm, and CAD.  Heart muscle function.  Heart valve function.  Signs of a past heart attack.  Fluid buildup around the heart.  Thickening of the heart muscle.  A tumor or infectious growth around the heart valves. Tell a health care provider about:  Any allergies you have.  All medicines you are taking, including vitamins, herbs, eye drops, creams, and over-the-counter medicines.  Any blood disorders you have.  Any surgeries you have had.  Any medical conditions you have.  Whether you are pregnant or may be pregnant. What are the risks? Generally, this is a safe procedure. However, problems may occur, including:  Allergic reaction to dye (contrast) that may be used during the procedure. What  happens before the procedure? No specific preparation is needed. You may eat and drink normally. What happens during the procedure?   An IV tube may be inserted into one of your veins.  You may receive contrast through this tube. A contrast is an injection that improves the quality of the pictures from your heart.  A gel will be applied to your chest.  A wand-like tool (transducer) will be moved over your chest. The gel will help to transmit the sound waves from the transducer.  The sound waves will harmlessly bounce off of your heart to allow the heart images to be captured in real-time motion. The images will be recorded on a computer. The procedure may vary among health care providers and hospitals. What happens after the procedure?  You may return to your normal, everyday life, including diet, activities, and medicines, unless your health care provider tells you not to do that. Summary  An echocardiogram is a procedure that uses painless sound waves (ultrasound) to produce an image of the heart.  Images from an echocardiogram can  provide important information about the size and shape of your heart, heart muscle function, heart valve function, and fluid buildup around your heart.  You do not need to do anything to prepare before this procedure. You may eat and drink normally.  After the echocardiogram is completed, you may return to your normal, everyday life, unless your health care provider tells you not to do that. This information is not intended to replace advice given to you by your health care provider. Make sure you discuss any questions you have with your health care provider. Document Released: 12/21/1999 Document Revised: 04/15/2018 Document Reviewed: 01/26/2016 Elsevier Patient Education  2020 [place], Emma Sable, MD  12/10/2018 2:24 PM    [place]

## 2018-12-10 NOTE — Patient Instructions (Addendum)
Medication Instructions:  - Your physician recommends that you continue on your current medications as directed. Please refer to the Current Medication list given to you today.  *If you need a refill on your cardiac medications before your next appointment, please call your pharmacy*  Lab Work: - none ordered  If you have labs (blood work) drawn today and your tests are completely normal, you will receive your results only by: Marland Kitchen MyChart Message (if you have MyChart) OR . A paper copy in the mail If you have any lab test that is abnormal or we need to change your treatment, we will call you to review the results.  Testing/Procedures: - Your physician has requested that you have an echocardiogram. Echocardiography is a painless test that uses sound waves to create images of your heart. It provides your doctor with information about the size and shape of your heart and how well your heart's chambers and valves are working. This procedure takes approximately one hour. There are no restrictions for this procedure.  - Your physician has requested that you have cardiac CT. Cardiac computed tomography (CT) is a painless test that uses an x-ray machine to take clear, detailed pictures of your heart.   Your cardiac CT will be scheduled at one of the below locations:   [facility] [address] [place], [place] [address] (336) [place] [address] [place], [place] [address] [phone]  If scheduled at [facility], please arrive at the [facility] main entrance of [facility] 30-45 minutes prior to test start time. Proceed to the [facility] Radiology Department (first floor) to check-in and test prep.  If scheduled at [facility], please arrive 15 mins early for check-in and test prep.  Please follow these instructions carefully (unless otherwise directed):  Hold all  erectile dysfunction medications at least 3 days (72 hrs) prior to test.  On the Night Before the Test: . Be sure to Drink plenty of water. . Do not consume any caffeinated/decaffeinated beverages or chocolate 12 hours prior to your test. . Do not take any antihistamines 12 hours prior to your test.  On the Day of the Test: . Drink plenty of water. Do not drink any water within one hour of the test. . Do not eat any food 4 hours prior to the test. . You may take your regular medications prior to the test.  . Take metoprolol two hours prior to test. . HOLD Furosemide/Hydrochlorothiazide morning of the test. . FEMALES- please wear underwire-free bra if available       After the Test: . Drink plenty of water. . After receiving IV contrast, you may experience a mild flushed feeling. This is normal. . On occasion, you may experience a mild rash up to 24 hours after the test. This is not dangerous. If this occurs, you can take Benadryl 25 mg and increase your fluid intake. . If you experience trouble breathing, this can be serious. If it is severe call 911 IMMEDIATELY. If it is mild, please call our office. . If you take any of these medications: Glipizide/Metformin, Avandament, Glucavance, please do not take 48 hours after completing test unless otherwise instructed.   Once we have confirmed authorization from your insurance company, we will call you to set up a date and time for your test.   For non-scheduling related questions, please contact the cardiac imaging nurse navigator should you  have any questions/concerns: Marchia Bond, RN Navigator Cardiac Imaging [facility] [phone] Office   Follow-Up: At [facility], you and your health needs are our priority.  As part of our continuing mission to provide you with exceptional heart care, we have created designated Provider Care Teams.  These Care Teams include your primary Cardiologist (physician) and  Advanced Practice Providers (APPs -  Physician Assistants and Nurse Practitioners) who all work together to provide you with the care you need, when you need it.  Your next appointment:   2 month(s)  The format for your next appointment:   In Person  Provider:   Kate Sable, MD  Other Instructions N/a   Cardiac CT Angiogram  A cardiac CT angiogram is a procedure to look at the heart and the area around the heart. It may be done to help find the cause of chest pains or other symptoms of heart disease. During this procedure, a large X-ray machine, called a CT scanner, takes detailed pictures of the heart and the surrounding area after a dye (contrast material) has been injected into blood vessels in the area. The procedure is also sometimes called a coronary CT angiogram, coronary artery scanning, or CTA. A cardiac CT angiogram allows the health care provider to see how well blood is flowing to and from the heart. The health care provider will be able to see if there are any problems, such as: Blockage or narrowing of the coronary arteries in the heart. Fluid around the heart. Signs of weakness or disease in the muscles, valves, and tissues of the heart. Tell a health care provider about: Any allergies you have. This is especially important if you have had a previous allergic reaction to contrast dye. All medicines you are taking, including vitamins, herbs, eye drops, creams, and over-the-counter medicines. Any blood disorders you have. Any surgeries you have had. Any medical conditions you have. Whether you are pregnant or may be pregnant. Any anxiety disorders, chronic pain, or other conditions you have that may increase your stress or prevent you from lying still. What are the risks? Generally, this is a safe procedure. However, problems may occur, including: Bleeding. Infection. Allergic reactions to medicines or dyes. Damage to other structures or organs. Kidney damage  from the dye or contrast that is used. Increased risk of cancer from radiation exposure. This risk is low. Talk with your health care provider about: The risks and benefits of testing. How you can receive the lowest dose of radiation. What happens before the procedure? Wear comfortable clothing and remove any jewelry, glasses, dentures, and hearing aids. Follow instructions from your health care provider about eating and drinking. This may include: For 12 hours before the test - avoid caffeine. This includes tea, coffee, soda, energy drinks, and diet pills. Drink plenty of water or other fluids that do not have caffeine in them. Being well-hydrated can prevent complications. For 4-6 hours before the test - stop eating and drinking. The contrast dye can cause nausea, but this is less likely if your stomach is empty. Ask your health care provider about changing or stopping your regular medicines. This is especially important if you are taking diabetes medicines, blood thinners, or medicines to treat erectile dysfunction. What happens during the procedure? Hair on your chest may need to be removed so that small sticky patches called electrodes can be placed on your chest. These will transmit information that helps to monitor your heart during the test. An  IV tube will be inserted into one of your veins. You might be given a medicine to control your heart rate during the test. This will help to ensure that good images are obtained. You will be asked to lie on an exam table. This table will slide in and out of the CT machine during the procedure. Contrast dye will be injected into the IV tube. You might feel warm, or you may get a metallic taste in your mouth. You will be given a medicine (nitroglycerin) to relax (dilate) the arteries in your heart. The table that you are lying on will move into the CT machine tunnel for the scan. The person running the machine will give you instructions while the scans  are being done. You may be asked to: Keep your arms above your head. Hold your breath. Stay very still, even if the table is moving. When the scanning is complete, you will be moved out of the machine. The IV tube will be removed. The procedure may vary among health care providers and hospitals. What happens after the procedure? You might feel warm, or you may get a metallic taste in your mouth from the contrast dye. You may have a headache from the nitroglycerin. After the procedure, drink water or other fluids to wash (flush) the contrast material out of your body. Contact a health care provider if you have any symptoms of allergy to the contrast. These symptoms include: Shortness of breath. Rash or hives. A racing heartbeat. Most people can return to their normal activities right after the procedure. Ask your health care provider what activities are safe for you. It is up to you to get the results of your procedure. Ask your health care provider, or the department that is doing the procedure, when your results will be ready. Summary A cardiac CT angiogram is a procedure to look at the heart and the area around the heart. It may be done to help find the cause of chest pains or other symptoms of heart disease. During this procedure, a large X-ray machine, called a CT scanner, takes detailed pictures of the heart and the surrounding area after a dye (contrast material) has been injected into blood vessels in the area. Ask your health care provider about changing or stopping your regular medicines before the procedure. This is especially important if you are taking diabetes medicines, blood thinners, or medicines to treat erectile dysfunction. After the procedure, drink water or other fluids to wash (flush) the contrast material out of your body. This information is not intended to replace advice given to you by your health care provider. Make sure you discuss any questions you have with your  health care provider. Document Released: 12/06/2007 Document Revised: 12/05/2016 Document Reviewed: 11/12/2015 Elsevier Patient Education  [place].    Echocardiogram An echocardiogram is a procedure that uses painless sound waves (ultrasound) to produce an image of the heart. Images from an echocardiogram can provide important information about:  Signs of coronary artery disease (CAD).  Aneurysm detection. An aneurysm is a weak or damaged part of an artery wall that bulges out from the normal force of blood pumping through the body.  Heart size and shape. Changes in the size or shape of the heart can be associated with certain conditions, including heart failure, aneurysm, and CAD.  Heart muscle function.  Heart valve function.  Signs of a past heart attack.  Fluid buildup around the heart.  Thickening of the heart muscle.  A tumor or infectious growth around the heart valves. Tell a health care provider about:  Any allergies you have.  All medicines you are taking, including vitamins, herbs, eye drops, creams, and over-the-counter medicines.  Any blood disorders you have.  Any surgeries you have had.  Any medical conditions you have.  Whether you are pregnant or may be pregnant. What are the risks? Generally, this is a safe procedure. However, problems may occur, including:  Allergic reaction to dye (contrast) that may be used during the procedure. What happens before the procedure? No specific preparation is needed. You may eat and drink normally. What happens during the procedure?   An IV tube may be inserted into one of your veins.  You may receive contrast through this tube. A contrast is an injection that improves the quality of the pictures from your heart.  A gel will be applied to your chest.  A wand-like tool (transducer) will be moved over your chest. The gel will help to transmit the sound waves from the transducer.  The sound waves will  harmlessly bounce off of your heart to allow the heart images to be captured in real-time motion. The images will be recorded on a computer. The procedure may vary among health care providers and hospitals. What happens after the procedure?  You may return to your normal, everyday life, including diet, activities, and medicines, unless your health care provider tells you not to do that. Summary  An echocardiogram is a procedure that uses painless sound waves (ultrasound) to produce an image of the heart.  Images from an echocardiogram can provide important information about the size and shape of your heart, heart muscle function, heart valve function, and fluid buildup around your heart.  You do not need to do anything to prepare before this procedure. You may eat and drink normally.  After the echocardiogram is completed, you may return to your normal, everyday life, unless your health care provider tells you not to do that. This information is not intended to replace advice given to you by your health care provider. Make sure you discuss any questions you have with your health care provider. Document Released: 12/21/1999 Document Revised: 04/15/2018 Document Reviewed: 01/26/2016 Elsevier Patient Education  2020 [employer].

## 2018-12-13 ENCOUNTER — Other Ambulatory Visit: Payer: Self-pay

## 2018-12-13 ENCOUNTER — Ambulatory Visit (INDEPENDENT_AMBULATORY_CARE_PROVIDER_SITE_OTHER): Payer: PPO | Admitting: Internal Medicine

## 2018-12-13 ENCOUNTER — Encounter: Payer: Self-pay | Admitting: Internal Medicine

## 2018-12-13 VITALS — Ht 61.5 in | Wt 164.5 lb

## 2018-12-13 DIAGNOSIS — E1129 Type 2 diabetes mellitus with other diabetic kidney complication: Secondary | ICD-10-CM

## 2018-12-13 DIAGNOSIS — R5383 Other fatigue: Secondary | ICD-10-CM

## 2018-12-13 DIAGNOSIS — I10 Essential (primary) hypertension: Secondary | ICD-10-CM | POA: Diagnosis not present

## 2018-12-13 DIAGNOSIS — Z7901 Long term (current) use of anticoagulants: Secondary | ICD-10-CM

## 2018-12-13 DIAGNOSIS — E119 Type 2 diabetes mellitus without complications: Secondary | ICD-10-CM | POA: Diagnosis not present

## 2018-12-13 DIAGNOSIS — R519 Headache, unspecified: Secondary | ICD-10-CM | POA: Diagnosis not present

## 2018-12-13 DIAGNOSIS — E538 Deficiency of other specified B group vitamins: Secondary | ICD-10-CM | POA: Diagnosis not present

## 2018-12-13 DIAGNOSIS — R809 Proteinuria, unspecified: Secondary | ICD-10-CM | POA: Diagnosis not present

## 2018-12-13 DIAGNOSIS — E785 Hyperlipidemia, unspecified: Secondary | ICD-10-CM

## 2018-12-13 MED ORDER — BUPROPION HCL 75 MG PO TABS: 75.0000 mg | ORAL_TABLET | Freq: Two times a day (BID) | ORAL | 11 refills | Status: DC

## 2018-12-13 NOTE — Progress Notes (Signed)
Virtual Visit via [email or web address]  This visit type was conducted due to national recommendations for restrictions regarding the COVID-19 pandemic (e.g. social distancing).  This format is felt to be most appropriate for this patient at this time.  All issues noted in this document were discussed and addressed.  No physical exam was performed (except for noted visual exam findings with Video Visits).   I connected with@ on 12/13/18 at  4:00 PM EST by a video enabled telemedicine application or telephone and verified that I am speaking with the correct person using two identifiers. Location patient: home Location provider: work or home office Persons participating in the virtual visit: patient, provider  I discussed the limitations, risks, security and privacy concerns of performing an evaluation and management service by telephone and the availability of in person appointments. I also discussed with the patient that there may be a patient responsible charge related to this service. The patient expressed understanding and agreed to proceed.  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  Hypertension: patient checks blood pressure frequently and readings have ranged from 123/91 to 173/100 on the same day pulse is 70 to 96.  Has a lot of emotional stressors currently due to older frail husband with Parkinson's.  Who has become increasingly dependent on her for emotional support and companionship.  She is "running the entire household" and has very little time for herself. Feels that her blood pressure is aggravated by anxiety.  Feels fatigued constantly ,  Too tired to exercise,  Not eating right,  Eating junk food, candy,  Skipping healthy meals.  Out of B12    T2DM: does not check blood sugars .  Not taking meds.  No neuropathy symptoms   Chronic pain,  headaches;  Using fioricet once daily,   ROS: See pertinent positives and negatives per HPI.  Past Medical History:  Diagnosis Date  . Diabetes mellitus without complication ([place])   . H/O toxic shock syndrome 2001   post urologic procedure for stone removal  . 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 Hyperlipidemia   . Hypertension   . Ischemic colitis ([place]) 2012-2013   s/p left colectomy May 2013  . Kidney stones     Past Surgical History:  Procedure Laterality Date  . ABDOMINAL HYSTERECTOMY  1996  . APPENDECTOMY    . bilateral tubal ligation    . BREAST EXCISIONAL BIOPSY Left 1989   benign  . BREAST SURGERY  1989   left biopsy, normal  . COLECTOMY Left May 2013   [place], ischemic colitis  . COLONOSCOPY WITH PROPOFOL N/A 11/16/2014   Procedure: COLONOSCOPY WITH PROPOFOL;  Surgeon: Manya Silvas, MD;  [facility] END[facility]COPY;  Service: Endoscopy;  Laterality: N/A;  . ESOPHAGOGASTRODUODENOSCOPY (EGD) WITH PROPOFOL N/A 11/16/2014   Procedure: ESOPHAGOGASTRODUODENOSCOPY (EGD) WITH PROPOFOL;  Surgeon: Manya Silvas, MD;  Location: [facility] ENDOSCOPY;  Service: Endoscopy;  Laterality: N/A;  . SPINE SURGERY  1984   lumbar diskectomy, [place]  . ureterolithotomy      Family History  Problem Relation Age of Onset  . Hypertension Mother   . Cancer Mother   . Diabetes Mother   . Heart disease Father   . Hypertension Father   . Diabetes Father   . Breast  cancer Paternal Aunt        <50  . Breast cancer Cousin   . Alzheimer's disease Sister   . Breast cancer Sister 70       DCIS    SOCIAL HX:  reports that she has never smoked. She has never used smokeless tobacco. She reports that she does not drink alcohol or use drugs.   Current Outpatient Medications:  .  ALPRAZolam (XANAX) 0.25 MG tablet, TAKE 1 TABLET BY MOUTH AT BEDTIME AS NEEDED FOR ANXIETY, Disp: 30 tablet, Rfl: 2 .  amLODipine (NORVASC) 2.5 MG tablet, Take  1 tablet (2.5 mg total) by mouth daily., Disp: 90 tablet, Rfl: 3 .  aspirin 81 MG tablet, Take 81 mg by mouth daily., Disp: , Rfl:  .  atorvastatin (LIPITOR) 40 MG tablet, Take 1 tablet (40 mg total) by mouth daily., Disp: 90 tablet, Rfl: 2 .  butalbital-acetaminophen-caffeine (FIORICET) 50-325-40 MG tablet, TAKE 1 TABLET BY MOUTH EVERY  12 HOURS AS NEEDED FOR HEADACHE, Disp: 60 tablet, Rfl: 5 .  Cholecalciferol (VITAMIN D3) 1000 UNITS CAPS, Take by mouth., Disp: , Rfl:  .  cyanocobalamin (,VITAMIN B-12,) 1000 MCG/ML injection, INJECT 1 ML (1,000 MCG TOTAL) INTO THE MUSCLE EVERY 30 DAYS., Disp: 10 mL, Rfl: 0 .  DULoxetine (CYMBALTA) 20 MG capsule, Take 1 capsule by mouth once daily, Disp: 90 capsule, Rfl: 0 .  furosemide (LASIX) 20 MG tablet, Take 1 tablet by mouth once daily, Disp: 90 tablet, Rfl: 0 .  glucose blood test strip, Use to check blood sugar once daily., Disp: 100 each, Rfl: 5 .  imipramine (TOFRANIL) 25 MG tablet, TAKE 1 TABLET BY MOUTH AT BEDTIME, Disp: 90 tablet, Rfl: 0 .  metoprolol succinate (TOPROL-XL) 100 MG 24 hr tablet, TAKE 1 TABLET BY MOUTH AT BEDTIME, Disp: 90 tablet, Rfl: 0 .  nitroGLYCERIN (NITROSTAT) 0.4 MG SL tablet, Place 1 tablet (0.4 mg total) under the tongue every 5 (five) minutes as needed for chest pain., Disp: 50 tablet, Rfl: 3 .  Omega-3 Fatty Acids (FISH OIL) 1000 MG CAPS, Take 1 capsule daily by mouth., Disp: , Rfl:  .  omeprazole (PRILOSEC) 20 MG capsule, Take 20 mg by mouth daily., Disp: , Rfl:  .  OneTouch Delica Lancets [id number] MISC, Use to check blood sugar once daily., Disp: 100 each, Rfl: 5 .  Syringe/Needle, Disp, (SYRINGE 3CC/25GX1") 25G X 1" 3 ML MISC, Use for b12 injections, Disp: 50 each, Rfl: 0 .  telmisartan (MICARDIS) 80 MG tablet, Take 1 tablet by mouth once daily, Disp: 90 tablet, Rfl: 0 .  traMADol (ULTRAM) 50 MG tablet, Take 1 tablet (50 mg total) by mouth every 8 (eight) hours as needed., Disp: 90 tablet, Rfl: 4 .  warfarin (COUMADIN) 1 MG  tablet, Take 1 tablet (1 mg total) by mouth daily. With 6 mg, Disp: 30 tablet, Rfl: 5 .  warfarin (COUMADIN) 6 MG tablet, Take 1 tablet by mouth once daily, Disp: 90 tablet, Rfl: 0 .  buPROPion (WELLBUTRIN) 75 MG tablet, Take 1 tablet (75 mg total) by mouth 2 (two) times daily., Disp: 60 tablet, Rfl: 11  Current Facility-Administered Medications:  .  cyanocobalamin ((VITAMIN B-12)) injection 1,000 mcg, 1,000 mcg, Intramuscular, Once, Derrel Nip, Aris Everts, MD  EXAM:  VITALS per patient if applicable:  GENERAL: alert, oriented, appears well and in no acute distress  HEENT: atraumatic, conjunttiva clear, no obvious abnormalities on inspection of external nose and ears  NECK: normal movements of the head and neck  LUNGS: on inspection no signs of respiratory distress, breathing rate appears normal, no obvious gross SOB, gasping or wheezing  CV: no obvious cyanosis  MS: moves all visible extremities without noticeable abnormality  PSYCH/NEURO: pleasant and cooperative, no obvious depression or anxiety, speech and thought processing grossly intact  ASSESSMENT AND PLAN:  Discussed the following assessment and plan:  Hyperlipidemia LDL goal <100 - Plan: INR/PT  B12 deficiency - Plan: cyanocobalamin (,VITAMIN B-12,) 1000 MCG/ML injection  Caregiver with fatigue  Chronic daily headache  Diabetes mellitus type 2, diet-controlled (HCC)  Essential hypertension  Long term current use of anticoagulant therapy  Microalbuminuria due to type 2 diabetes mellitus ([place])  B12 deficiency 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  Continue monthly . b12 injections  Caregiver with fatigue Trial of wellbutrin 75 mg bid , continue cymbalta.   Chronic daily headache Managed with tramadol prn and once to two fioricet daily.    MRI brain and radiographs of cervical spine were done in March 2019 with no obvious cause. Still Needs sleep study to rule out OSA  as cause given progressively harder to control blood pressure.  She has deferred in the past but is willing to undergo testing if BP is not controlled with 2 maximally titrated agents. Will await results of ECHO ordered by cardiology.   Continue   Cymbalta for chronic pain management but may need to suspend if fatigue is not improved with wellbutrin   Diabetes mellitus type 2, diet-controlled ([place]) She has not been  following a  low glycemic index diet but her diabetes remains   diet controlled.   A1c is due for repeat  She is taking  a baby  aspirin and atorvastatin, and telmisartan  daily   Lab Results  Component Value Date   HGBA1C 6.3 11/30/2018   Lab Results  Component Value Date   MICROALBUR 2.2 (H) 11/13/2017       Hypertension Readings are labile despite Restarting amlodipine at  2.5 mg daily .  With readings dipping to 117/84  No increases were made today. Continue telmisartan , metoprolol and amlodipine.  Will send for sleep study if right sided pressures are elevated on upcoming ECHO, RN VISIT NEEDED for BP check   Long term current use of anticoagulant therapy INR dropped to 1.4 on 6 mg daily  Repeat INR this week after 1 week of 7 mg daily   Microalbuminuria due to type 2 diabetes mellitus ([place]) Continue telmisartan     I discussed the assessment and treatment plan with the patient. The patient was provided an opportunity to ask questions and all were answered. The patient agreed with the plan and demonstrated an understanding of the instructions.   The patient was advised to call back or seek an in-person evaluation if the symptoms worsen or if the condition fails to improve as anticipated. .  I provided  25 minutes of non-face-to-face time during this encounter reviewing patient's current problems and past procedures/imaging studies, providing counseling on the above mentioned problems , and coordination  of care .  Crecencio Mc, MD

## 2018-12-14 MED ORDER — BUTALBITAL-APAP-CAFFEINE 50-325-40 MG PO TABS: ORAL_TABLET | ORAL | 5 refills | Status: DC

## 2018-12-14 MED ORDER — CYANOCOBALAMIN 1000 MCG/ML IJ SOLN: INTRAMUSCULAR | 0 refills | Status: DC

## 2018-12-14 NOTE — Assessment & Plan Note (Addendum)
Readings are labile despite Restarting amlodipine at  2.5 mg daily .  With readings dipping to 117/84  No increases were made today. Continue telmisartan , metoprolol and amlodipine.  Will send for sleep study if right sided pressures are elevated on upcoming ECHO, RN VISIT NEEDED for BP check

## 2018-12-14 NOTE — Assessment & Plan Note (Signed)
Trial of wellbutrin 75 mg bid , continue cymbalta.

## 2018-12-14 NOTE — Assessment & Plan Note (Signed)
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  Continue monthly . b12 injections

## 2018-12-14 NOTE — Assessment & Plan Note (Addendum)
Managed with tramadol prn and once to two fioricet daily.    MRI brain and radiographs of cervical spine were done in March 2019 with no obvious cause. Still Needs sleep study to rule out OSA as cause given progressively harder to control blood pressure.  She has deferred in the past but is willing to undergo testing if BP is not controlled with 2 maximally titrated agents. Will await results of ECHO ordered by cardiology.   Continue   Cymbalta for chronic pain management but may need to suspend if fatigue is not improved with wellbutrin

## 2018-12-14 NOTE — Assessment & Plan Note (Signed)
INR dropped to 1.4 on 6 mg daily  Repeat INR this week after 1 week of 7 mg daily

## 2018-12-14 NOTE — Assessment & Plan Note (Signed)
Continue telmisartan. 

## 2018-12-14 NOTE — Assessment & Plan Note (Signed)
She has not been  following a  low glycemic index diet but her diabetes remains   diet controlled.   A1c is due for repeat  She is taking  a baby  aspirin and atorvastatin, and telmisartan  daily   Lab Results  Component Value Date   HGBA1C 6.3 11/30/2018   Lab Results  Component Value Date   MICROALBUR 2.2 (H) 11/13/2017

## 2018-12-16 DIAGNOSIS — H2513 Age-related nuclear cataract, bilateral: Secondary | ICD-10-CM | POA: Diagnosis not present

## 2018-12-16 LAB — HM DIABETES EYE EXAM

## 2018-12-21 ENCOUNTER — Telehealth: Payer: Self-pay | Admitting: *Deleted

## 2018-12-21 ENCOUNTER — Other Ambulatory Visit: Payer: Self-pay

## 2018-12-21 DIAGNOSIS — Z7901 Long term (current) use of anticoagulants: Secondary | ICD-10-CM

## 2018-12-21 NOTE — Telephone Encounter (Signed)
Placed standing order for INR

## 2018-12-22 ENCOUNTER — Other Ambulatory Visit: Payer: Self-pay | Admitting: Cardiology

## 2018-12-22 DIAGNOSIS — R079 Chest pain, unspecified: Secondary | ICD-10-CM

## 2018-12-23 ENCOUNTER — Telehealth: Payer: Self-pay | Admitting: Cardiology

## 2018-12-23 ENCOUNTER — Other Ambulatory Visit (INDEPENDENT_AMBULATORY_CARE_PROVIDER_SITE_OTHER): Payer: PPO

## 2018-12-23 ENCOUNTER — Other Ambulatory Visit: Payer: Self-pay

## 2018-12-23 DIAGNOSIS — Z7901 Long term (current) use of anticoagulants: Secondary | ICD-10-CM

## 2018-12-23 LAB — PROTIME-INR
INR: 2.6 ratio — ABNORMAL HIGH (ref 0.8–1.0)
Prothrombin Time: 30.2 s — ABNORMAL HIGH (ref 9.6–13.1)

## 2018-12-23 NOTE — Telephone Encounter (Signed)
Patient calling Has not heard anything about scheduling CT and would like to check on status Please call to discuss

## 2018-12-23 NOTE — Telephone Encounter (Signed)
Message sent to Earvin Hansen for update on scheduling.   Notified patient that I have reached out to scheduling for an update and will continue to check to see if it has been scheduled.

## 2019-01-03 ENCOUNTER — Other Ambulatory Visit: Payer: Self-pay | Admitting: Internal Medicine

## 2019-01-05 NOTE — Telephone Encounter (Signed)
Howie Ill  You 14 hours ago (5:29 PM)  NM Patient is scheduled for Jan 14,2021 130pm   Message text

## 2019-01-06 ENCOUNTER — Ambulatory Visit (INDEPENDENT_AMBULATORY_CARE_PROVIDER_SITE_OTHER): Payer: PPO

## 2019-01-06 ENCOUNTER — Other Ambulatory Visit: Payer: Self-pay

## 2019-01-06 DIAGNOSIS — R079 Chest pain, unspecified: Secondary | ICD-10-CM

## 2019-01-07 DIAGNOSIS — Z9841 Cataract extraction status, right eye: Secondary | ICD-10-CM

## 2019-01-07 HISTORY — DX: Cataract extraction status, left eye: Z98.41

## 2019-01-17 ENCOUNTER — Other Ambulatory Visit: Payer: Self-pay | Admitting: Internal Medicine

## 2019-01-18 ENCOUNTER — Encounter (HOSPITAL_COMMUNITY): Payer: Self-pay

## 2019-01-19 ENCOUNTER — Telehealth (HOSPITAL_COMMUNITY): Payer: Self-pay | Admitting: Emergency Medicine

## 2019-01-19 NOTE — Telephone Encounter (Signed)
Reaching out to patient to offer assistance regarding upcoming cardiac imaging study; pt verbalizes understanding of appt date/time, parking situation and where to check in, pre-test NPO status and medications ordered, and verified current allergies; name and call back number provided for further questions should they arise Marisol Glazer RN Navigator Cardiac Imaging [facility] [phone] office [phone] cell 

## 2019-01-20 ENCOUNTER — Ambulatory Visit
Admission: RE | Admit: 2019-01-20 | Discharge: 2019-01-20 | Disposition: A | Discharge status: Home / Self Care | Payer: PPO | Source: Ambulatory Visit | Attending: Cardiology | Admitting: Cardiology

## 2019-01-20 ENCOUNTER — Other Ambulatory Visit: Payer: Self-pay

## 2019-01-20 DIAGNOSIS — Z79899 Other long term (current) drug therapy: Secondary | ICD-10-CM | POA: Insufficient documentation

## 2019-01-20 DIAGNOSIS — E119 Type 2 diabetes mellitus without complications: Secondary | ICD-10-CM | POA: Diagnosis not present

## 2019-01-20 DIAGNOSIS — E7800 Pure hypercholesterolemia, unspecified: Secondary | ICD-10-CM | POA: Diagnosis not present

## 2019-01-20 DIAGNOSIS — Z7982 Long term (current) use of aspirin: Secondary | ICD-10-CM | POA: Diagnosis not present

## 2019-01-20 DIAGNOSIS — Z86711 Personal history of pulmonary embolism: Secondary | ICD-10-CM | POA: Insufficient documentation

## 2019-01-20 DIAGNOSIS — R079 Chest pain, unspecified: Secondary | ICD-10-CM | POA: Diagnosis not present

## 2019-01-20 DIAGNOSIS — Z7901 Long term (current) use of anticoagulants: Secondary | ICD-10-CM | POA: Insufficient documentation

## 2019-01-20 DIAGNOSIS — E785 Hyperlipidemia, unspecified: Secondary | ICD-10-CM | POA: Insufficient documentation

## 2019-01-20 DIAGNOSIS — Z8249 Family history of ischemic heart disease and other diseases of the circulatory system: Secondary | ICD-10-CM | POA: Diagnosis not present

## 2019-01-20 DIAGNOSIS — I10 Essential (primary) hypertension: Secondary | ICD-10-CM | POA: Insufficient documentation

## 2019-01-20 LAB — POCT I-STAT CREATININE: Creatinine, Ser: 1 mg/dL (ref 0.44–1.00)

## 2019-01-20 MED ORDER — METOPROLOL TARTRATE 5 MG/5ML IV SOLN
5.0000 mg | INTRAVENOUS | Status: DC | PRN
  Administered 2019-01-20 (×3): 5 mg via INTRAVENOUS

## 2019-01-20 MED ORDER — IOHEXOL 350 MG/ML SOLN
75.0000 mL | Freq: Once | INTRAVENOUS | Status: AC | PRN
  Administered 2019-01-20: 100 mL via INTRAVENOUS

## 2019-01-20 MED ORDER — NITROGLYCERIN 0.4 MG SL SUBL
0.8000 mg | SUBLINGUAL_TABLET | Freq: Once | SUBLINGUAL | Status: AC
  Administered 2019-01-20: 0.8 mg via SUBLINGUAL

## 2019-01-20 NOTE — Progress Notes (Signed)
Patient tolerated CT without incident. Drank a coke after. Ambulatory to exit steady gait.

## 2019-02-07 ENCOUNTER — Other Ambulatory Visit: Payer: Self-pay | Admitting: Internal Medicine

## 2019-02-11 ENCOUNTER — Other Ambulatory Visit: Payer: Self-pay

## 2019-02-11 ENCOUNTER — Other Ambulatory Visit (INDEPENDENT_AMBULATORY_CARE_PROVIDER_SITE_OTHER): Payer: PPO

## 2019-02-11 DIAGNOSIS — Z7901 Long term (current) use of anticoagulants: Secondary | ICD-10-CM

## 2019-02-11 LAB — PROTIME-INR
INR: 3.6 ratio — ABNORMAL HIGH (ref 0.8–1.0)
Prothrombin Time: 39.7 s — ABNORMAL HIGH (ref 9.6–13.1)

## 2019-02-14 ENCOUNTER — Ambulatory Visit (INDEPENDENT_AMBULATORY_CARE_PROVIDER_SITE_OTHER): Payer: PPO | Admitting: Cardiology

## 2019-02-14 ENCOUNTER — Encounter: Payer: Self-pay | Admitting: Cardiology

## 2019-02-14 ENCOUNTER — Other Ambulatory Visit: Payer: Self-pay

## 2019-02-14 VITALS — BP 131/78 | HR 67 | Ht 62.0 in | Wt 171.2 lb

## 2019-02-14 DIAGNOSIS — E7800 Pure hypercholesterolemia, unspecified: Secondary | ICD-10-CM

## 2019-02-14 DIAGNOSIS — R079 Chest pain, unspecified: Secondary | ICD-10-CM | POA: Diagnosis not present

## 2019-02-14 DIAGNOSIS — I10 Essential (primary) hypertension: Secondary | ICD-10-CM | POA: Diagnosis not present

## 2019-02-14 NOTE — Progress Notes (Signed)
Cardiology Office Note:    Date:  02/14/2019   ID:  [name], DOB 01-23-1950, MRN [id number]  PCP:  Crecencio Mc, MD  Cardiologist:  Kate Sable, MD  Electrophysiologist:  None   Referring MD: Crecencio Mc, MD   Chief Complaint  Patient presents with  . other    2 month f/u echo/CT. Meds reviewed verbally with pt.    History of Present Illness:    [name] is a 69 y.o. female with a hx of hypertension, hyperlipidemia, protein C deficiency, PE who presents for follow-up.  She was last seen due to chest pain.    She had a 67-month history of chest pain associated with shortness of breath.  Echocardiogram and coronary CTA was ordered due to symptoms and risk factors of hypertension, hyperlipidemia, family history of CAD in father..  She now presents for results.  He states feeling okay, has occasional heartburns due to history of GERD.  Takes omeprazole 20 mg daily which she states helps.   Past Medical History:  Diagnosis Date  . Diabetes mellitus without complication ([place])   . H/O toxic shock syndrome 2001   post urologic procedure for stone removal  . 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 Hyperlipidemia   . Hypertension   . Ischemic colitis ([place]) 2012-2013   s/p left colectomy May 2013  . Kidney stones     Past Surgical History:  Procedure Laterality Date  . ABDOMINAL HYSTERECTOMY  1996  . APPENDECTOMY    . bilateral tubal ligation    . BREAST EXCISIONAL BIOPSY Left 1989   benign  . BREAST SURGERY  1989   left biopsy, normal  . COLECTOMY Left May 2013   [place], ischemic colitis  . COLONOSCOPY WITH PROPOFOL N/A 11/16/2014   Procedure: COLONOSCOPY WITH PROPOFOL;  Surgeon: Manya Silvas, MD;  Location[facility] ENDOSCOPY;  Service: Endoscopy;  Laterality: N/A;  . ESOPHAGOGASTRODUODENOSCOPY (EGD) WITH PROPOFOL N/A 11/16/2014   Procedure: ESOPHAGOGASTRODUODENOSCOPY (EGD) WITH PROPOFOL;  Surgeon:  Manya Silvas, MD;  Location: [facility] ENDOSCOPY;  Service: Endoscopy;  Laterality: N/A;  . SPINE SURGERY  1984   lumbar diskectomy, [place]  . ureterolithotomy      Current Medications: Current Meds  Medication Sig  . ALPRAZolam (XANAX) 0.25 MG tablet TAKE 1 TABLET BY MOUTH AT BEDTIME AS NEEDED FOR ANXIETY  . amLODipine (NORVASC) 2.5 MG tablet Take 1 tablet (2.5 mg total) by mouth daily.  Marland Kitchen aspirin 81 MG tablet Take 81 mg by mouth daily.  Marland Kitchen atorvastatin (LIPITOR) 40 MG tablet Take 1 tablet (40 mg total) by mouth daily.  Marland Kitchen buPROPion (WELLBUTRIN) 75 MG tablet Take 1 tablet (75 mg total) by mouth 2 (two) times daily.  . butalbital-acetaminophen-caffeine (FIORICET) 50-325-40 MG tablet TAKE 1 TABLET BY MOUTH EVERY  12 HOURS AS NEEDED FOR HEADACHE  . Cholecalciferol (VITAMIN D3) 1000 UNITS CAPS Take by mouth.  . cyanocobalamin (,VITAMIN B-12,) 1000 MCG/ML injection INJECT 1 ML (1,000 MCG TOTAL) INTO THE MUSCLE EVERY 30 DAYS.  . DULoxetine (CYMBALTA) 20 MG capsule Take 1 capsule by mouth once daily  . furosemide (LASIX) 20 MG tablet Take 1 tablet by mouth once daily  . glucose blood test strip Use to check blood sugar once daily.  Marland Kitchen imipramine (TOFRANIL) 25 MG tablet TAKE 1 TABLET BY MOUTH AT BEDTIME  . metoprolol succinate (TOPROL-XL) 100 MG 24 hr tablet TAKE 1 TABLET BY  MOUTH AT BEDTIME  . nitroGLYCERIN (NITROSTAT) 0.4 MG SL tablet Place 1 tablet (0.4 mg total) under the tongue every 5 (five) minutes as needed for chest pain.  . Omega-3 Fatty Acids (FISH OIL) 1000 MG CAPS Take 1 capsule daily by mouth.  Marland Kitchen omeprazole (PRILOSEC) 20 MG capsule Take 20 mg by mouth daily.  Glory Rosebush Delica Lancets 99991111 MISC Use to check blood sugar once daily.  . Syringe/Needle, Disp, (SYRINGE 3CC/25GX1") 25G X 1" 3 ML MISC Use for b12 injections  . telmisartan (MICARDIS) 80 MG tablet Take 1 tablet by mouth once daily  . traMADol (ULTRAM) 50 MG tablet Take 1 tablet (50 mg total) by mouth every 8 (eight) hours as  needed.  . warfarin (COUMADIN) 1 MG tablet Take 1 tablet (1 mg total) by mouth daily. With 6 mg  . warfarin (COUMADIN) 6 MG tablet Take 1 tablet by mouth once daily   Current Facility-Administered Medications for the 02/14/19 encounter (Office Visit) with Kate Sable, MD  Medication  . cyanocobalamin ((VITAMIN B-12)) injection 1,000 mcg     Allergies:   Patient has no known allergies.   Social History   Socioeconomic History  . Marital status: Married    Spouse name: Not on file  . Number of children: Not on file  . Years of education: Not on file  . Highest education level: Not on file  Occupational History  . Not on file  Tobacco Use  . Smoking status: Never Smoker  . Smokeless tobacco: Never Used  Substance and Sexual Activity  . Alcohol use: No  . Drug use: No  . Sexual activity: Yes  Other Topics Concern  . Not on file  Social History Narrative  . Not on file   Social Determinants of Health   Financial Resource Strain:   . Difficulty of Paying Living Expenses: Not on file  Food Insecurity:   . Worried About Charity fundraiser in the Last Year: Not on file  . Ran Out of Food in the Last Year: Not on file  Transportation Needs:   . Lack of Transportation (Medical): Not on file  . Lack of Transportation (Non-Medical): Not on file  Physical Activity:   . Days of Exercise per Week: Not on file  . Minutes of Exercise per Session: Not on file  Stress:   . Feeling of Stress : Not on file  Social Connections:   . Frequency of Communication with Friends and Family: Not on file  . Frequency of Social Gatherings with Friends and Family: Not on file  . Attends Religious Services: Not on file  . Active Member of Clubs or Organizations: Not on file  . Attends Archivist Meetings: Not on file  . Marital Status: Not on file     Family History: The patient's family history includes Alzheimer's disease in her sister; Breast cancer in her cousin and paternal  aunt; Breast cancer (age of onset: 75) in her sister; Cancer in her mother; Diabetes in her father and mother; Heart disease in her father; Hypertension in her father and mother.  ROS:   Please see the history of present illness.     All other systems reviewed and are negative.  EKGs/Labs/Other Studies Reviewed:    The following studies were reviewed today:   EKG:  EKG is  ordered today.  The ekg ordered today demonstrates normal sinus rhythm, nonspecific T wave changes.  Recent Labs: 11/30/2018: ALT 18; BUN 18; Potassium 4.5; Sodium  138 01/20/2019: Creatinine, Ser 1.00  Recent Lipid Panel    Component Value Date/Time   CHOL 184 11/30/2018 0931   CHOL 158 04/28/2018 0905   TRIG 155.0 (H) 11/30/2018 0931   HDL 51.10 11/30/2018 0931   HDL 50 04/28/2018 0905   CHOLHDL 4 11/30/2018 0931   VLDL 31.0 11/30/2018 0931   LDLCALC 102 (H) 11/30/2018 0931   LDLCALC 83 04/28/2018 0905   LDLCALC 101 (H) 11/21/2016 1549   LDLDIRECT 111.0 08/13/2016 1046    Physical Exam:    VS:  BP 131/78 (BP Location: Left Arm, Patient Position: Sitting, Cuff Size: Normal)   Pulse 67   Ht 5\' 2"  (1.575 m)   Wt 171 lb 4 oz (77.7 kg)   SpO2 97%   BMI 31.32 kg/m     Wt Readings from Last 3 Encounters:  02/14/19 171 lb 4 oz (77.7 kg)  12/13/18 164 lb 8 oz (74.6 kg)  12/10/18 164 lb 8 oz (74.6 kg)     GEN:  Well nourished, well developed in no acute distress HEENT: Normal NECK: No JVD; No carotid bruits LYMPHATICS: No lymphadenopathy CARDIAC: RRR, no murmurs, rubs, gallops RESPIRATORY:  Clear to auscultation without rales, wheezing or rhonchi  ABDOMEN: Soft, non-tender, non-distended MUSCULOSKELETAL:  No edema; No deformity  SKIN: Warm and dry NEUROLOGIC:  Alert and oriented x 3 PSYCHIATRIC:  Normal affect   ASSESSMENT:    1. Chest pain of uncertain etiology   2. Essential hypertension   3. Pure hypercholesterolemia    PLAN:    In order of problems listed above:  1. Cardiogram showed  normal ejection fraction with EF of 60 to 123456, normal diastolic function.  Coronary CTA reviewed by myself showed no evidence for coronary artery disease.  Patient reassured.  Also complains of heartburn symptoms.  Advised patient to increase omeprazole to 40 mg daily x2 weeks and see if symptoms improve.  If symptoms improve she should continue at the dose of 40 mg.  If symptoms status same with atypical chest pain she can back to 20 mg daily. 2. Blood pressure controlled today.  Continue Toprol-XL and amlodipine 2.5 mg daily.   3. He has history of hyperlipidemia, continue Lipitor.  Follow-up as needed  This note was generated in part or whole with voice recognition software. Voice recognition is usually quite accurate but there are transcription errors that can and very often do occur. I apologize for any typographical errors that were not detected and corrected.  Medication Adjustments/Labs and Tests Ordered: Current medicines are reviewed at length with the patient today.  Concerns regarding medicines are outlined above.  Orders Placed This Encounter  Procedures  . EKG 12-Lead   No orders of the defined types were placed in this encounter.   Patient Instructions  Medication Instructions:  Your physician recommends that you continue on your current medications as directed. Please refer to the Current Medication list given to you today.  *If you need a refill on your cardiac medications before your next appointment, please call your pharmacy*  Lab Work: None ordered If you have labs (blood work) drawn today and your tests are completely normal, you will receive your results only by: Marland Kitchen MyChart Message (if you have MyChart) OR . A paper copy in the mail If you have any lab test that is abnormal or we need to change your treatment, we will call you to review the results.  Testing/Procedures: None ordered  Follow-Up: At [facility], you and your  health needs are our priority.  As  part of our continuing mission to provide you with exceptional heart care, we have created designated Provider Care Teams.  These Care Teams include your primary Cardiologist (physician) and Advanced Practice Providers (APPs -  Physician Assistants and Nurse Practitioners) who all work together to provide you with the care you need, when you need it.  Your next appointment:   As needed  The format for your next appointment:   In Person  Provider:    You may see Kate Sable, MD or one of the following Advanced Practice Providers on your designated Care Team:    Murray Hodgkins, NP  Christell Faith, PA-C  Marrianne Mood, PA-C   Other Instructions N/A     Signed, Kate Sable, MD  02/14/2019 4:46 PM    [place]

## 2019-02-14 NOTE — Patient Instructions (Signed)
Medication Instructions:  Your physician recommends that you continue on your current medications as directed. Please refer to the Current Medication list given to you today.  *If you need a refill on your cardiac medications before your next appointment, please call your pharmacy*  Lab Work: None ordered If you have labs (blood work) drawn today and your tests are completely normal, you will receive your results only by: . MyChart Message (if you have MyChart) OR . A paper copy in the mail If you have any lab test that is abnormal or we need to change your treatment, we will call you to review the results.  Testing/Procedures: None ordered  Follow-Up: At [facility], you and your health needs are our priority.  As part of our continuing mission to provide you with exceptional heart care, we have created designated Provider Care Teams.  These Care Teams include your primary Cardiologist (physician) and Advanced Practice Providers (APPs -  Physician Assistants and Nurse Practitioners) who all work together to provide you with the care you need, when you need it.  Your next appointment:   As needed   The format for your next appointment:   In Person  Provider:    You may see Brian Agbor-Etang, MD or one of the following Advanced Practice Providers on your designated Care Team:    Christopher Berge, NP  Ryan Dunn, PA-C  Jacquelyn Visser, PA-C   Other Instructions N/A  

## 2019-02-15 ENCOUNTER — Other Ambulatory Visit: Payer: Self-pay | Admitting: Internal Medicine

## 2019-02-22 ENCOUNTER — Other Ambulatory Visit: Payer: Self-pay | Admitting: Internal Medicine

## 2019-02-22 ENCOUNTER — Other Ambulatory Visit: Payer: PPO

## 2019-02-23 ENCOUNTER — Other Ambulatory Visit: Payer: Self-pay

## 2019-02-23 ENCOUNTER — Other Ambulatory Visit (INDEPENDENT_AMBULATORY_CARE_PROVIDER_SITE_OTHER): Payer: PPO

## 2019-02-23 DIAGNOSIS — Z7901 Long term (current) use of anticoagulants: Secondary | ICD-10-CM

## 2019-02-24 ENCOUNTER — Other Ambulatory Visit: Payer: Self-pay | Admitting: Internal Medicine

## 2019-02-24 DIAGNOSIS — Z7901 Long term (current) use of anticoagulants: Secondary | ICD-10-CM

## 2019-02-24 LAB — PROTIME-INR
INR: 1.9 — ABNORMAL HIGH
Prothrombin Time: 18.7 s — ABNORMAL HIGH (ref 9.0–11.5)

## 2019-03-16 DIAGNOSIS — H04123 Dry eye syndrome of bilateral lacrimal glands: Secondary | ICD-10-CM | POA: Diagnosis not present

## 2019-03-25 ENCOUNTER — Other Ambulatory Visit (INDEPENDENT_AMBULATORY_CARE_PROVIDER_SITE_OTHER): Payer: PPO

## 2019-03-25 ENCOUNTER — Other Ambulatory Visit: Payer: Self-pay

## 2019-03-25 DIAGNOSIS — Z7901 Long term (current) use of anticoagulants: Secondary | ICD-10-CM

## 2019-03-25 LAB — PROTIME-INR
INR: 4.4 ratio — ABNORMAL HIGH (ref 0.8–1.0)
Prothrombin Time: 48.6 s — ABNORMAL HIGH (ref 9.6–13.1)

## 2019-03-26 ENCOUNTER — Other Ambulatory Visit: Payer: Self-pay | Admitting: Internal Medicine

## 2019-03-26 DIAGNOSIS — Z7901 Long term (current) use of anticoagulants: Secondary | ICD-10-CM

## 2019-04-06 ENCOUNTER — Other Ambulatory Visit: Payer: Self-pay | Admitting: Internal Medicine

## 2019-04-06 DIAGNOSIS — H2513 Age-related nuclear cataract, bilateral: Secondary | ICD-10-CM | POA: Diagnosis not present

## 2019-04-24 ENCOUNTER — Other Ambulatory Visit: Payer: Self-pay | Admitting: Internal Medicine

## 2019-04-26 ENCOUNTER — Other Ambulatory Visit: Payer: Self-pay | Admitting: Internal Medicine

## 2019-04-28 ENCOUNTER — Ambulatory Visit (INDEPENDENT_AMBULATORY_CARE_PROVIDER_SITE_OTHER): Payer: PPO

## 2019-04-28 VITALS — BP 143/92 | HR 61 | Ht 62.0 in | Wt 160.0 lb

## 2019-04-28 DIAGNOSIS — Z0000 Encounter for general adult medical examination without abnormal findings: Secondary | ICD-10-CM

## 2019-04-28 NOTE — Progress Notes (Signed)
Subjective:   [name] is a 69 y.o. female who presents for Medicare Annual (Subsequent) preventive examination.  Review of Systems:  No ROS.  Medicare Wellness Virtual Visit.  Visual/audio telehealth visit, UTA vital signs.   Ht/Wt provided.  See social history for additional risk factors.   Cardiac Risk Factors include: advanced age (>59men, >59 women);diabetes mellitus;hypertension     Objective:     Vitals: BP (!) 143/92 (BP Location: Left Arm, Patient Position: Sitting, Cuff Size: Normal)   Pulse 61   Ht 5\' 2"  (1.575 m)   Wt 160 lb (72.6 kg)   BMI 29.26 kg/m   Body mass index is 29.26 kg/m.  Advanced Directives 04/28/2019 04/27/2018 03/23/2017 03/21/2016 11/22/2015 11/16/2014 05/19/2014  Does Patient Have a Medical Advance Directive? No Yes Yes Yes No No Yes  Type of Advance Directive - Living will Living will;Healthcare Power of [place];Living will - - [place]  Does patient want to make changes to medical advance directive? Yes (MAU/Ambulatory/Procedural Areas - Information given) No - Patient declined No - Patient declined No - Patient declined - - No - Patient declined  Copy of Healthcare Power of Attorney in Chart? - - No - copy requested No - copy requested - - No - copy requested  Would patient like information on creating a medical advance directive? - - - - No - patient declined information No - patient declined information -    Tobacco Social History   Tobacco Use  Smoking Status Never Smoker  Smokeless Tobacco Never Used     Counseling given: Not Answered   Clinical Intake:  Pre-visit preparation completed: Yes        Diabetes: Yes(Followed by pcp)  How often do you need to have someone help you when you read instructions, pamphlets, or other written materials from your doctor or pharmacy?: 1 - Never  Interpreter Needed?: No     Past Medical History:  Diagnosis Date  . Diabetes mellitus  without complication ([place])   . H/O toxic shock syndrome 2001   post urologic procedure for stone removal  . 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 Hyperlipidemia   . Hypertension   . Ischemic colitis ([place]) 2012-2013   s/p left colectomy May 2013  . Kidney stones    Past Surgical History:  Procedure Laterality Date  . ABDOMINAL HYSTERECTOMY  1996  . APPENDECTOMY    . bilateral tubal ligation    . BREAST EXCISIONAL BIOPSY Left 1989   benign  . BREAST SURGERY  1989   left biopsy, normal  . COLECTOMY Left May 2013   [place], ischemic colitis  . COLONOSCOPY WITH PROPOFOL N/A 11/16/2014   Procedure: COLONOSCOPY WITH PROPOFOL;  Surgeon: Manya Silvas, MD;  [facility] ENDOSCOPY;  Service: Endoscopy;  Laterality: N/A;  . ESOPHAGOGASTRODUODENOSCOPY (EGD) WITH PROPOFOL N/A 11/16/2014   Procedure: ESOPHAGOGASTRODUODENOSCOPY (EGD) WITH PROPOFOL;  Surgeon: Manya Silvas, MD;  Location: [facility] ENDOSCOPY;  Service: Endoscopy;  Laterality: N/A;  . SPINE SURGERY  1984   lumbar diskectomy, [place]  . ureterolithotomy     Family History  Problem Relation Age of Onset  . Hypertension Mother   . Cancer Mother   . Diabetes Mother   . Heart disease Father   . Hypertension Father   . Diabetes Father   . Breast cancer Paternal Aunt        <  77  . Breast cancer Cousin   . Alzheimer's disease Sister   . Breast cancer Sister 49       DCIS   Social History   Socioeconomic History  . Marital status: Married    Spouse name: Not on file  . Number of children: Not on file  . Years of education: Not on file  . Highest education level: Not on file  Occupational History  . Not on file  Tobacco Use  . Smoking status: Never Smoker  . Smokeless tobacco: Never Used  Substance and Sexual Activity  . Alcohol use: No  . Drug use: No  . Sexual activity: Yes  Other Topics Concern  . Not on file  Social History Narrative  . Not on file    Social Determinants of Health   Financial Resource Strain:   . Difficulty of Paying Living Expenses:   Food Insecurity:   . Worried About Charity fundraiser in the Last Year:   . Arboriculturist in the Last Year:   Transportation Needs:   . Film/video editor (Medical):   Marland Kitchen Lack of Transportation (Non-Medical):   Physical Activity:   . Days of Exercise per Week:   . Minutes of Exercise per Session:   Stress:   . Feeling of Stress :   Social Connections:   . Frequency of Communication with Friends and Family:   . Frequency of Social Gatherings with Friends and Family:   . Attends Religious Services:   . Active Member of Clubs or Organizations:   . Attends Archivist Meetings:   Marland Kitchen Marital Status:     Outpatient Encounter Medications as of 04/28/2019  Medication Sig  . buPROPion (WELLBUTRIN) 75 MG tablet Take 1 tablet (75 mg total) by mouth 2 (two) times daily.  Marland Kitchen ALPRAZolam (XANAX) 0.25 MG tablet TAKE 1 TABLET BY MOUTH AT BEDTIME AS NEEDED FOR ANXIETY  . amLODipine (NORVASC) 2.5 MG tablet Take 1 tablet (2.5 mg total) by mouth daily.  Marland Kitchen aspirin 81 MG tablet Take 81 mg by mouth daily.  Marland Kitchen atorvastatin (LIPITOR) 40 MG tablet Take 1 tablet by mouth once daily  . butalbital-acetaminophen-caffeine (FIORICET) 50-325-40 MG tablet TAKE 1 TABLET BY MOUTH EVERY  12 HOURS AS NEEDED FOR HEADACHE  . Cholecalciferol (VITAMIN D3) 1000 UNITS CAPS Take by mouth.  . cyanocobalamin (,VITAMIN B-12,) 1000 MCG/ML injection INJECT 1 ML (1,000 MCG TOTAL) INTO THE MUSCLE EVERY 30 DAYS.  . DULoxetine (CYMBALTA) 20 MG capsule Take 1 capsule by mouth once daily  . furosemide (LASIX) 20 MG tablet Take 1 tablet by mouth once daily  . glucose blood test strip Use to check blood sugar once daily.  Marland Kitchen imipramine (TOFRANIL) 25 MG tablet TAKE 1 TABLET BY MOUTH AT BEDTIME (Patient not taking: Reported on 04/28/2019)  . metoprolol succinate (TOPROL-XL) 100 MG 24 hr tablet TAKE 1 TABLET BY MOUTH AT  BEDTIME  . nitroGLYCERIN (NITROSTAT) 0.4 MG SL tablet Place 1 tablet (0.4 mg total) under the tongue every 5 (five) minutes as needed for chest pain.  . Omega-3 Fatty Acids (FISH OIL) 1000 MG CAPS Take 1 capsule daily by mouth.  Marland Kitchen omeprazole (PRILOSEC) 20 MG capsule Take 20 mg by mouth daily.  Glory Rosebush Delica Lancets [id number] MISC Use to check blood sugar once daily.  . Syringe/Needle, Disp, (SYRINGE 3CC/25GX1") 25G X 1" 3 ML MISC Use for b12 injections  . telmisartan (MICARDIS) 80 MG tablet Take 1 tablet by mouth  once daily  . traMADol (ULTRAM) 50 MG tablet Take 1 tablet (50 mg total) by mouth every 8 (eight) hours as needed.  . warfarin (COUMADIN) 1 MG tablet Take 1 tablet (1 mg total) by mouth daily. With 6 mg  . warfarin (COUMADIN) 6 MG tablet Take 1 tablet by mouth once daily   Facility-Administered Encounter Medications as of 04/28/2019  Medication  . cyanocobalamin ((VITAMIN B-12)) injection 1,000 mcg    Activities of Daily Living In your present state of health, do you have any difficulty performing the following activities: 04/28/2019  Hearing? Y  Vision? N  Difficulty concentrating or making decisions? N  Walking or climbing stairs? N  Dressing or bathing? N  Doing errands, shopping? N  Preparing Food and eating ? N  Using the Toilet? N  In the past six months, have you accidently leaked urine? N  Do you have problems with loss of bowel control? N  Managing your Medications? N  Managing your Finances? N  Housekeeping or managing your Housekeeping? N  Some recent data might be hidden    Patient Care Team: Crecencio Mc, MD as PCP - General (Internal Medicine) Kate Sable, MD as PCP - Cardiology (Cardiology) Manya Silvas, MD (Inactive) (Gastroenterology)    Assessment:   This is a routine wellness examination for Shaunika.  Nurse connected with patient 04/28/19 at 11:00 AM EDT by a telephone enabled telemedicine application and verified that I am speaking with  the correct person using two identifiers. Patient stated full name and DOB. Patient gave permission to continue with virtual visit. Patient's location was at home and Nurse's location was at [place] office.   Patient is alert and oriented x3. Patient denies difficulty focusing or concentrating. Patient likes to read daily and plays solitaire for brain health.   Health Maintenance Due: -[place] completed. Plans to update via mychart or in office.  -Hgb A1c- 11/30/18 (6.3) -Foot care- denies any changes, wounds, numbness, tingling. Followed by pcp.  See completed HM at the end of note.   Eye: Visual acuity not assessed. Virtual visit. Followed by their ophthalmologist.  Retinopathy- none reported. Cataract surgery R eye scheduled end of May 2021.   Dental: Visits every 6 months.    Hearing: Hearing aids- yes  Safety:  Patient feels safe at home- yes Patient does have smoke detectors at home- yes Patient does wear sunscreen or protective clothing when in direct sunlight - yes Patient does wear seat belt when in a moving vehicle - yes Patient drives- yes Adequate lighting in walkways free from debris- yes Grab bars and handrails used as appropriate- yes Ambulates with an assistive device- no Cell phone on person when ambulating outside of the home-yes  Social: Alcohol intake - no  Smoking history- never   Smokers in home? none Illicit drug use? none  Medication: Taking as directed and without issues.  Refill request Tofranil, pended for approval. Not taken in 6 weeks.  Wellbutrin- taking 1 daily. Pill box in use -yes  Self managed - yes   Covid-19: Precautions and sickness symptoms discussed. Wears mask, social distancing, hand hygiene as appropriate.   Activities of Daily Living Patient denies needing assistance with: household chores, feeding themselves, getting from bed to chair, getting to the toilet, bathing/showering, dressing, managing money, or  preparing meals.   Discussed the importance of a healthy diet, water intake and the benefits of aerobic exercise.   Physical activity- caregiver for husband in the home.  Diet:  Low carb Water: fair intake. Encouraged to stay hydrated.  Fluids: Sugar free lemonade, occ diet pepsi Caffeine: 1 cup of coffee  Other Providers Patient Care Team: Crecencio Mc, MD as PCP - General (Internal Medicine) Kate Sable, MD as PCP - Cardiology (Cardiology) Manya Silvas, MD (Inactive) (Gastroenterology) Exercise Activities and Dietary recommendations Current Exercise Habits: Home exercise routine, Intensity: Mild  Goals      Patient Stated   . Maintain Healthy Lifestyle (pt-stated)     Eat healthy Stay active        Fall Risk Fall Risk  04/28/2019 12/13/2018 11/26/2018 11/16/2018 04/27/2018  Falls in the past year? 0 0 0 0 0  Number falls in past yr: - - - 0 -  Injury with Fall? - - - - -  Comment - - - - -  Risk for fall due to : - - - - -  Risk for fall due to: Comment - - - - -  Follow up Falls evaluation completed Falls evaluation completed Falls evaluation completed Falls evaluation completed -   Timed Get Up and Go performed: no, virtual visit  Depression Screen PHQ 2/9 Scores 04/28/2019 11/16/2018 03/23/2017 03/21/2016  PHQ - 2 Score 0 1 0 0     Cognitive Function MMSE - Mini Mental State Exam 04/28/2019 03/23/2017 03/21/2016  Not completed: Unable to complete - -  Orientation to time - 5 5  Orientation to Place - 5 5  Registration - 3 3  Attention/ Calculation - 5 5  Recall - 3 3  Language- name 2 objects - 2 2  Language- repeat - 1 1  Language- follow 3 step command - 3 3  Language- read & follow direction - 1 1  Write a sentence - 1 1  Copy design - 1 1  Total score - 30 30     6CIT Screen 04/27/2018  What Year? 0 points  What month? 0 points  What time? 0 points  Count back from 20 0 points  Months in reverse 0 points  Repeat phrase 0 points   Total Score 0    Immunization History  Administered Date(s) Administered  . Fluad Quad(high Dose 65+) 09/17/2018  . Influenza, High Dose Seasonal PF 09/03/2015, 11/21/2016, 11/13/2017  . Influenza,inj,Quad PF,6+ Mos 10/27/2012, 11/02/2013  . Influenza-Unspecified 11/07/2014  . Pneumococcal Conjugate-13 05/19/2014  . Pneumococcal Polysaccharide-23 05/31/2015  . Pneumococcal-Unspecified 12/07/2015  . Tdap 10/27/2012   Screening Tests Health Maintenance  Topic Date Due  . FOOT EXAM  Never done  . COVID-19 Vaccine (1) Never done  . HEMOGLOBIN A1C  05/30/2019  . INFLUENZA VACCINE  08/07/2019  . OPHTHALMOLOGY EXAM  12/16/2019  . MAMMOGRAM  08/26/2020  . TETANUS/TDAP  10/28/2022  . COLONOSCOPY  12/05/2024  . DEXA SCAN  Completed  . Hepatitis C Screening  Completed  . PNA vac Low Risk Adult  Completed      Plan:   Keep all routine maintenance appointments.   Medicare Attestation I have personally reviewed: The patient's medical and social history Their use of alcohol, tobacco or illicit drugs Their current medications and supplements The patient's functional ability including ADLs,fall risks, home safety risks, cognitive, and hearing and visual impairment Diet and physical activities Evidence for depression   I have reviewed and discussed with patient certain preventive protocols, quality metrics, and best practice recommendations.      OBrien-Blaney, Jamiyah Dingley L, LPN  [id number]     I have reviewed the above  information and agree with above.   Deborra Medina, MD

## 2019-04-28 NOTE — Patient Instructions (Addendum)
  [name] , Thank you for taking time to come for your Medicare Wellness Visit. I appreciate your ongoing commitment to your health goals. Please review the following plan we discussed and let me know if I can assist you in the future.   These are the goals we discussed: Goals      Patient Stated   . Maintain Healthy Lifestyle (pt-stated)     Eat healthy Stay active        This is a list of the screening recommended for you and due dates:  Health Maintenance  Topic Date Due  . Complete foot exam   Never done  . COVID-19 Vaccine (1) Never done  . Hemoglobin A1C  05/30/2019  . Flu Shot  08/07/2019  . Eye exam for diabetics  12/16/2019  . Mammogram  08/26/2020  . Tetanus Vaccine  10/28/2022  . Colon Cancer Screening  12/05/2024  . DEXA scan (bone density measurement)  Completed  .  Hepatitis C: One time screening is recommended by Center for Disease Control  (CDC) for  adults born from [birth year] through 1966.   Completed  . Pneumonia vaccines  Completed

## 2019-05-02 MED ORDER — IMIPRAMINE HCL 25 MG PO TABS: 25.0000 mg | ORAL_TABLET | Freq: Every day | ORAL | 0 refills | Status: DC

## 2019-05-03 ENCOUNTER — Other Ambulatory Visit (INDEPENDENT_AMBULATORY_CARE_PROVIDER_SITE_OTHER): Payer: PPO

## 2019-05-03 ENCOUNTER — Other Ambulatory Visit: Payer: Self-pay

## 2019-05-03 DIAGNOSIS — Z7901 Long term (current) use of anticoagulants: Secondary | ICD-10-CM

## 2019-05-03 LAB — PROTIME-INR
INR: 4.5 ratio — ABNORMAL HIGH (ref 0.8–1.0)
Prothrombin Time: 49.6 s — ABNORMAL HIGH (ref 9.6–13.1)

## 2019-05-11 ENCOUNTER — Other Ambulatory Visit: Payer: Self-pay | Admitting: Internal Medicine

## 2019-05-11 DIAGNOSIS — F409 Phobic anxiety disorder, unspecified: Secondary | ICD-10-CM

## 2019-05-12 NOTE — Telephone Encounter (Signed)
Refill request for xanax, last seen 12-13-18, last filled 09-30-18.  Please advise.

## 2019-05-13 DIAGNOSIS — H2511 Age-related nuclear cataract, right eye: Secondary | ICD-10-CM | POA: Diagnosis not present

## 2019-05-13 DIAGNOSIS — I10 Essential (primary) hypertension: Secondary | ICD-10-CM | POA: Diagnosis not present

## 2019-05-19 ENCOUNTER — Encounter: Payer: Self-pay | Admitting: Ophthalmology

## 2019-05-20 ENCOUNTER — Other Ambulatory Visit: Payer: Self-pay | Admitting: Internal Medicine

## 2019-05-20 NOTE — Anesthesia Preprocedure Evaluation (Addendum)
Anesthesia Evaluation  Patient identified by MRN, date of birth, ID band Patient awake    Reviewed: Allergy & Precautions, H&P , NPO status , Patient's Chart, lab work & pertinent test results, reviewed documented beta blocker date and time   History of Anesthesia Complications (+) PONV and history of anesthetic complications  Airway Mallampati: II  TM Distance: >3 FB Neck ROM: full    Dental no notable dental hx.    Pulmonary neg pulmonary ROS,    Pulmonary exam normal breath sounds clear to auscultation       Cardiovascular Exercise Tolerance: Good hypertension,  Rhythm:regular Rate:Normal     Neuro/Psych  Headaches, PSYCHIATRIC DISORDERS Depression HOH    GI/Hepatic Neg liver ROS, GERD  ,Ischemic colitis s/p colectomy 2013   Endo/Other  diabetes, Type 2  Renal/GU Renal disease (nephrolithiasis)  negative genitourinary   Musculoskeletal   Abdominal   Peds  Hematology negative hematology ROS (+) Hx PE   Anesthesia Other Findings Cardiology note 02/14/19:  ASSESSMENT:  1. Chest pain of uncertain etiology  2. Essential hypertension  3. Pure hypercholesterolemia   PLAN:  In order of problems listed above:  1. Cardiogram showed normal ejection fraction with EF of 60 to 00%, normal diastolic function.  Coronary CTA reviewed by myself showed no evidence for coronary artery disease.  Patient reassured.  Also complains of heartburn symptoms.  Advised patient to increase omeprazole to 40 mg daily x2 weeks and see if symptoms improve.  If symptoms improve she should continue at the dose of 40 mg.  If symptoms status same with atypical chest pain she can back to 20 mg daily. 2. Blood pressure controlled today.  Continue Toprol-XL and amlodipine 2.5 mg daily.   3. He has history of hyperlipidemia, continue Lipitor.  Follow-up as needed   Reproductive/Obstetrics negative OB ROS                             Anesthesia Physical Anesthesia Plan  ASA: II  Anesthesia Plan: MAC   Post-op Pain Management:    Induction:   PONV Risk Score and Plan: Treatment may vary due to age or medical condition and TIVA  Airway Management Planned:   Additional Equipment:   Intra-op Plan:   Post-operative Plan:   Informed Consent: I have reviewed the patients History and Physical, chart, labs and discussed the procedure including the risks, benefits and alternatives for the proposed anesthesia with the patient or authorized representative who has indicated his/her understanding and acceptance.     Dental Advisory Given  Plan Discussed with: CRNA  Anesthesia Plan Comments:         Anesthesia Quick Evaluation

## 2019-05-26 ENCOUNTER — Other Ambulatory Visit
Admission: RE | Admit: 2019-05-26 | Discharge: 2019-05-26 | Disposition: A | Discharge status: Home / Self Care | Payer: PPO | Source: Ambulatory Visit | Attending: Ophthalmology | Admitting: Ophthalmology

## 2019-05-26 DIAGNOSIS — Z01812 Encounter for preprocedural laboratory examination: Secondary | ICD-10-CM | POA: Insufficient documentation

## 2019-05-26 DIAGNOSIS — Z20822 Contact with and (suspected) exposure to covid-19: Secondary | ICD-10-CM | POA: Insufficient documentation

## 2019-05-26 LAB — SARS CORONAVIRUS 2 (TAT 6-24 HRS): SARS Coronavirus 2: NEGATIVE

## 2019-05-26 NOTE — Discharge Instructions (Signed)

## 2019-05-30 ENCOUNTER — Encounter
Admission: RE | Disposition: A | Discharge status: Home / Self Care | Payer: Self-pay | Source: Home / Self Care | Attending: Ophthalmology

## 2019-05-30 ENCOUNTER — Encounter: Payer: Self-pay | Admitting: Ophthalmology

## 2019-05-30 ENCOUNTER — Other Ambulatory Visit: Payer: Self-pay

## 2019-05-30 ENCOUNTER — Ambulatory Visit: Payer: PPO | Admitting: Anesthesiology

## 2019-05-30 ENCOUNTER — Ambulatory Visit
Admission: RE | Admit: 2019-05-30 | Discharge: 2019-05-30 | Disposition: A | Discharge status: Home / Self Care | Payer: PPO | Attending: Ophthalmology | Admitting: Ophthalmology

## 2019-05-30 DIAGNOSIS — E119 Type 2 diabetes mellitus without complications: Secondary | ICD-10-CM | POA: Diagnosis not present

## 2019-05-30 DIAGNOSIS — Z79899 Other long term (current) drug therapy: Secondary | ICD-10-CM | POA: Diagnosis not present

## 2019-05-30 DIAGNOSIS — Z7982 Long term (current) use of aspirin: Secondary | ICD-10-CM | POA: Diagnosis not present

## 2019-05-30 DIAGNOSIS — D6859 Other primary thrombophilia: Secondary | ICD-10-CM | POA: Insufficient documentation

## 2019-05-30 DIAGNOSIS — K219 Gastro-esophageal reflux disease without esophagitis: Secondary | ICD-10-CM | POA: Diagnosis not present

## 2019-05-30 DIAGNOSIS — F329 Major depressive disorder, single episode, unspecified: Secondary | ICD-10-CM | POA: Diagnosis not present

## 2019-05-30 DIAGNOSIS — H2511 Age-related nuclear cataract, right eye: Secondary | ICD-10-CM | POA: Insufficient documentation

## 2019-05-30 DIAGNOSIS — E538 Deficiency of other specified B group vitamins: Secondary | ICD-10-CM | POA: Diagnosis not present

## 2019-05-30 DIAGNOSIS — Z9049 Acquired absence of other specified parts of digestive tract: Secondary | ICD-10-CM | POA: Insufficient documentation

## 2019-05-30 DIAGNOSIS — Z7901 Long term (current) use of anticoagulants: Secondary | ICD-10-CM | POA: Diagnosis not present

## 2019-05-30 DIAGNOSIS — Z974 Presence of external hearing-aid: Secondary | ICD-10-CM | POA: Insufficient documentation

## 2019-05-30 DIAGNOSIS — E7800 Pure hypercholesterolemia, unspecified: Secondary | ICD-10-CM | POA: Insufficient documentation

## 2019-05-30 DIAGNOSIS — K559 Vascular disorder of intestine, unspecified: Secondary | ICD-10-CM | POA: Diagnosis not present

## 2019-05-30 DIAGNOSIS — G43909 Migraine, unspecified, not intractable, without status migrainosus: Secondary | ICD-10-CM | POA: Diagnosis not present

## 2019-05-30 DIAGNOSIS — H25811 Combined forms of age-related cataract, right eye: Secondary | ICD-10-CM | POA: Diagnosis not present

## 2019-05-30 DIAGNOSIS — I10 Essential (primary) hypertension: Secondary | ICD-10-CM | POA: Insufficient documentation

## 2019-05-30 HISTORY — DX: Deficiency of other specified B group vitamins: E53.8

## 2019-05-30 HISTORY — DX: Unspecified hearing loss, unspecified ear: H91.90

## 2019-05-30 HISTORY — DX: Other specified postprocedural states: Z98.890

## 2019-05-30 HISTORY — DX: Gastro-esophageal reflux disease without esophagitis: K21.9

## 2019-05-30 HISTORY — DX: Depression, unspecified: F32.A

## 2019-05-30 HISTORY — PX: CATARACT EXTRACTION W/PHACO: SHX586

## 2019-05-30 HISTORY — DX: Nausea with vomiting, unspecified: Z98.890

## 2019-05-30 HISTORY — DX: Nausea with vomiting, unspecified: R11.2

## 2019-05-30 HISTORY — DX: Headache, unspecified: R51.9

## 2019-05-30 HISTORY — DX: Coagulation defect, unspecified: D68.9

## 2019-05-30 LAB — GLUCOSE, CAPILLARY: Glucose-Capillary: 115 mg/dL — ABNORMAL HIGH (ref 70–99)

## 2019-05-30 SURGERY — PHACOEMULSIFICATION, CATARACT, WITH IOL INSERTION
Anesthesia: Monitor Anesthesia Care | Site: Eye | Laterality: Right

## 2019-05-30 MED ORDER — LACTATED RINGERS IV SOLN: INTRAVENOUS | Status: DC

## 2019-05-30 MED ORDER — SODIUM HYALURONATE 23 MG/ML IO SOLN
INTRAOCULAR | Status: DC | PRN
  Administered 2019-05-30: 0.6 mL via INTRAOCULAR

## 2019-05-30 MED ORDER — FENTANYL CITRATE (PF) 100 MCG/2ML IJ SOLN
INTRAMUSCULAR | Status: DC | PRN
  Administered 2019-05-30: 50 ug via INTRAVENOUS

## 2019-05-30 MED ORDER — ARMC OPHTHALMIC DILATING DROPS
1.0000 "application " | OPHTHALMIC | Status: DC | PRN
  Administered 2019-05-30 (×3): 1 via OPHTHALMIC

## 2019-05-30 MED ORDER — SODIUM HYALURONATE 10 MG/ML IO SOLN
INTRAOCULAR | Status: DC | PRN
  Administered 2019-05-30: 0.55 mL via INTRAOCULAR

## 2019-05-30 MED ORDER — TETRACAINE HCL 0.5 % OP SOLN
1.0000 [drp] | OPHTHALMIC | Status: DC | PRN
  Administered 2019-05-30 (×3): 1 [drp] via OPHTHALMIC

## 2019-05-30 MED ORDER — LIDOCAINE HCL (PF) 2 % IJ SOLN
INTRAOCULAR | Status: DC | PRN
  Administered 2019-05-30: 1 mL via INTRAOCULAR

## 2019-05-30 MED ORDER — MOXIFLOXACIN HCL 0.5 % OP SOLN
OPHTHALMIC | Status: DC | PRN
  Administered 2019-05-30: 0.2 mL via OPHTHALMIC

## 2019-05-30 MED ORDER — MIDAZOLAM HCL 2 MG/2ML IJ SOLN
INTRAMUSCULAR | Status: DC | PRN
  Administered 2019-05-30: 2 mg via INTRAVENOUS

## 2019-05-30 MED ORDER — EPINEPHRINE PF 1 MG/ML IJ SOLN
INTRAOCULAR | Status: DC | PRN
  Administered 2019-05-30: 81 mL via OPHTHALMIC

## 2019-05-30 SURGICAL SUPPLY — 20 items
CANNULA ANT/CHMB 27G (MISCELLANEOUS) ×2 IMPLANT
CANNULA ANT/CHMB 27GA (MISCELLANEOUS) ×6 IMPLANT
DISSECTOR HYDRO NUCLEUS 50X22 (MISCELLANEOUS) ×3 IMPLANT
GLOVE SURG LX 7.5 STRW (GLOVE) ×2
GLOVE SURG LX STRL 7.5 STRW (GLOVE) ×1 IMPLANT
GLOVE SURG SYN 8.5  E (GLOVE) ×4
GLOVE SURG SYN 8.5 E (GLOVE) ×2 IMPLANT
GLOVE SURG SYN 8.5 PF PI (GLOVE) ×1 IMPLANT
GOWN STRL REUS W/ TWL LRG LVL3 (GOWN DISPOSABLE) ×2 IMPLANT
GOWN STRL REUS W/TWL LRG LVL3 (GOWN DISPOSABLE) ×4
LENS IOL TECNIS 22.0 (Intraocular Lens) ×3 IMPLANT
LENS IOL TECNIS MONO 1P 22.0 (Intraocular Lens) IMPLANT
MARKER SKIN DUAL TIP RULER LAB (MISCELLANEOUS) ×3 IMPLANT
PACK DR. KING ARMS (PACKS) ×3 IMPLANT
PACK EYE AFTER SURG (MISCELLANEOUS) ×3 IMPLANT
PACK OPTHALMIC (MISCELLANEOUS) ×3 IMPLANT
SYR 3ML LL SCALE MARK (SYRINGE) ×3 IMPLANT
SYR TB 1ML LUER SLIP (SYRINGE) ×3 IMPLANT
WATER STERILE IRR 250ML POUR (IV SOLUTION) ×3 IMPLANT
WIPE NON LINTING 3.25X3.25 (MISCELLANEOUS) ×3 IMPLANT

## 2019-05-30 NOTE — Transfer of Care (Signed)
Immediate Anesthesia Transfer of Care Note  Patient: [name]  Procedure(s) Performed: CATARACT EXTRACTION PHACO AND INTRAOCULAR LENS PLACEMENT (IOC) RIGHT DIABETIC (Right Eye)  Patient Location: PACU  Anesthesia Type: MAC  Level of Consciousness: awake, alert  and patient cooperative  Airway and Oxygen Therapy: Patient Spontanous Breathing and Patient connected to supplemental oxygen  Post-op Assessment: Post-op Vital signs reviewed, Patient's Cardiovascular Status Stable, Respiratory Function Stable, Patent Airway and No signs of Nausea or vomiting  Post-op Vital Signs: Reviewed and stable  Complications: No apparent anesthesia complications

## 2019-05-30 NOTE — Op Note (Signed)
OPERATIVE NOTE  AVALEEN Giles [id number] 05/30/2019   PREOPERATIVE DIAGNOSIS:  Nuclear sclerotic cataract right eye.  H25.11   POSTOPERATIVE DIAGNOSIS:    Nuclear sclerotic cataract right eye.     PROCEDURE:  Phacoemusification with posterior chamber intraocular lens placement of the right eye   LENS:   Implant Name Type Inv. Item Serial No. Manufacturer Lot No. LRB No. Used Action  LENS IOL TECNIS 22.0 - [id number] Intraocular Lens LENS IOL TECNIS 22.0 [id number]  Right 1 Implanted       Procedure(s) with comments: CATARACT EXTRACTION PHACO AND INTRAOCULAR LENS PLACEMENT (IOC) RIGHT DIABETIC (Right) - 3.84 0:37.1  DCB00 +22.0   ULTRASOUND TIME: 0 minutes 37 seconds.  CDE 3.84   SURGEON:  Benay Pillow, MD, MPH  ANESTHESIOLOGIST: Anesthesiologist: Alisa Graff, MD CRNA: Mayme Genta, CRNA   ANESTHESIA:  Topical with tetracaine drops augmented with 1% preservative-free intracameral lidocaine.  ESTIMATED BLOOD LOSS: less than 1 mL.   COMPLICATIONS:  None.   DESCRIPTION OF PROCEDURE:  The patient was identified in the holding room and transported to the operating room and placed in the supine position under the operating microscope.  The right eye was identified as the operative eye and it was prepped and draped in the usual sterile ophthalmic fashion.   A 1.0 millimeter clear-corneal paracentesis was made at the 10:30 position. 0.5 ml of preservative-free 1% lidocaine with epinephrine was injected into the anterior chamber.  The anterior chamber was filled with Healon 5 viscoelastic.  A 2.4 millimeter keratome was used to make a near-clear corneal incision at the 8:00 position.  A curvilinear capsulorrhexis was made with a cystotome and capsulorrhexis forceps.  Balanced salt solution was used to hydrodissect and hydrodelineate the nucleus.   Phacoemulsification was then used in stop and chop fashion to remove the lens nucleus and epinucleus.  The remaining cortex was  then removed using the irrigation and aspiration handpiece. Healon was then placed into the capsular bag to distend it for lens placement.  A lens was then injected into the capsular bag.  The remaining viscoelastic was aspirated.   Wounds were hydrated with balanced salt solution.  The anterior chamber was inflated to a physiologic pressure with balanced salt solution.   Intracameral vigamox 0.1 mL undiluted was injected into the eye and a drop placed onto the ocular surface.  No wound leaks were noted.  The patient was taken to the recovery room in stable condition without complications of anesthesia or surgery  Benay Pillow 05/30/2019, 7:51 AM

## 2019-05-30 NOTE — Anesthesia Postprocedure Evaluation (Signed)
Anesthesia Post Note  Patient: [name]  Procedure(s) Performed: CATARACT EXTRACTION PHACO AND INTRAOCULAR LENS PLACEMENT (IOC) RIGHT DIABETIC (Right Eye)     Patient location during evaluation: PACU Anesthesia Type: MAC Level of consciousness: awake and alert Pain management: pain level controlled Vital Signs Assessment: post-procedure vital signs reviewed and stable Respiratory status: spontaneous breathing, nonlabored ventilation, respiratory function stable and patient connected to nasal cannula oxygen Cardiovascular status: stable and blood pressure returned to baseline Postop Assessment: no apparent nausea or vomiting Anesthetic complications: no    Alisa Graff

## 2019-05-30 NOTE — H&P (Signed)

## 2019-05-30 NOTE — Anesthesia Procedure Notes (Signed)
Procedure Name: MAC Performed by: Shondell Fabel, CRNA Pre-anesthesia Checklist: Patient identified, Emergency Drugs available, Suction available, Timeout performed and Patient being monitored Patient Re-evaluated:Patient Re-evaluated prior to induction Oxygen Delivery Method: Nasal cannula Placement Confirmation: positive ETCO2       

## 2019-05-31 ENCOUNTER — Other Ambulatory Visit: Payer: Self-pay | Admitting: Internal Medicine

## 2019-05-31 ENCOUNTER — Encounter: Payer: Self-pay | Admitting: *Deleted

## 2019-06-07 ENCOUNTER — Telehealth: Payer: Self-pay | Admitting: Internal Medicine

## 2019-06-07 DIAGNOSIS — Z7901 Long term (current) use of anticoagulants: Secondary | ICD-10-CM

## 2019-06-07 NOTE — Telephone Encounter (Signed)
Pt said she gets monthly INR checks with lab. She wanted to come tomorrow so I went ahead and scheduled her the appointment but she needs the orders put in.

## 2019-06-07 NOTE — Telephone Encounter (Signed)
Lab has been ordered.

## 2019-06-08 ENCOUNTER — Other Ambulatory Visit (INDEPENDENT_AMBULATORY_CARE_PROVIDER_SITE_OTHER): Payer: PPO

## 2019-06-08 ENCOUNTER — Other Ambulatory Visit: Payer: Self-pay

## 2019-06-08 DIAGNOSIS — Z7901 Long term (current) use of anticoagulants: Secondary | ICD-10-CM | POA: Diagnosis not present

## 2019-06-09 LAB — PROTIME-INR
INR: 2.3 — ABNORMAL HIGH
Prothrombin Time: 23.7 s — ABNORMAL HIGH (ref 9.0–11.5)

## 2019-06-13 DIAGNOSIS — I10 Essential (primary) hypertension: Secondary | ICD-10-CM | POA: Diagnosis not present

## 2019-06-13 DIAGNOSIS — H2512 Age-related nuclear cataract, left eye: Secondary | ICD-10-CM | POA: Diagnosis not present

## 2019-06-14 ENCOUNTER — Encounter: Payer: Self-pay | Admitting: Ophthalmology

## 2019-06-14 ENCOUNTER — Other Ambulatory Visit: Payer: Self-pay

## 2019-06-16 ENCOUNTER — Other Ambulatory Visit
Admission: RE | Admit: 2019-06-16 | Discharge: 2019-06-16 | Disposition: A | Discharge status: Home / Self Care | Payer: PPO | Source: Ambulatory Visit | Attending: Ophthalmology | Admitting: Ophthalmology

## 2019-06-16 ENCOUNTER — Other Ambulatory Visit: Payer: Self-pay

## 2019-06-16 DIAGNOSIS — Z20822 Contact with and (suspected) exposure to covid-19: Secondary | ICD-10-CM | POA: Diagnosis not present

## 2019-06-16 DIAGNOSIS — Z01812 Encounter for preprocedural laboratory examination: Secondary | ICD-10-CM | POA: Insufficient documentation

## 2019-06-17 LAB — SARS CORONAVIRUS 2 (TAT 6-24 HRS): SARS Coronavirus 2: NEGATIVE

## 2019-06-17 NOTE — Discharge Instructions (Signed)

## 2019-06-20 ENCOUNTER — Encounter: Payer: Self-pay | Admitting: Ophthalmology

## 2019-06-20 ENCOUNTER — Ambulatory Visit: Payer: PPO | Admitting: Anesthesiology

## 2019-06-20 ENCOUNTER — Encounter
Admission: RE | Disposition: A | Discharge status: Home / Self Care | Payer: Self-pay | Source: Home / Self Care | Attending: Ophthalmology

## 2019-06-20 ENCOUNTER — Ambulatory Visit
Admission: RE | Admit: 2019-06-20 | Discharge: 2019-06-20 | Disposition: A | Discharge status: Home / Self Care | Payer: PPO | Attending: Ophthalmology | Admitting: Ophthalmology

## 2019-06-20 DIAGNOSIS — E538 Deficiency of other specified B group vitamins: Secondary | ICD-10-CM | POA: Insufficient documentation

## 2019-06-20 DIAGNOSIS — H9190 Unspecified hearing loss, unspecified ear: Secondary | ICD-10-CM | POA: Diagnosis not present

## 2019-06-20 DIAGNOSIS — E1136 Type 2 diabetes mellitus with diabetic cataract: Secondary | ICD-10-CM | POA: Diagnosis not present

## 2019-06-20 DIAGNOSIS — F329 Major depressive disorder, single episode, unspecified: Secondary | ICD-10-CM | POA: Diagnosis not present

## 2019-06-20 DIAGNOSIS — Z7901 Long term (current) use of anticoagulants: Secondary | ICD-10-CM | POA: Insufficient documentation

## 2019-06-20 DIAGNOSIS — H2512 Age-related nuclear cataract, left eye: Secondary | ICD-10-CM | POA: Diagnosis not present

## 2019-06-20 DIAGNOSIS — Z7982 Long term (current) use of aspirin: Secondary | ICD-10-CM | POA: Diagnosis not present

## 2019-06-20 DIAGNOSIS — E7800 Pure hypercholesterolemia, unspecified: Secondary | ICD-10-CM | POA: Insufficient documentation

## 2019-06-20 DIAGNOSIS — Z79899 Other long term (current) drug therapy: Secondary | ICD-10-CM | POA: Insufficient documentation

## 2019-06-20 DIAGNOSIS — H25812 Combined forms of age-related cataract, left eye: Secondary | ICD-10-CM | POA: Diagnosis not present

## 2019-06-20 DIAGNOSIS — I10 Essential (primary) hypertension: Secondary | ICD-10-CM | POA: Diagnosis not present

## 2019-06-20 DIAGNOSIS — K219 Gastro-esophageal reflux disease without esophagitis: Secondary | ICD-10-CM | POA: Insufficient documentation

## 2019-06-20 HISTORY — PX: CATARACT EXTRACTION W/PHACO: SHX586

## 2019-06-20 SURGERY — PHACOEMULSIFICATION, CATARACT, WITH IOL INSERTION
Anesthesia: Monitor Anesthesia Care | Site: Eye | Laterality: Left

## 2019-06-20 MED ORDER — TETRACAINE HCL 0.5 % OP SOLN
1.0000 [drp] | OPHTHALMIC | Status: DC | PRN
  Administered 2019-06-20 (×3): 1 [drp] via OPHTHALMIC

## 2019-06-20 MED ORDER — MOXIFLOXACIN HCL 0.5 % OP SOLN
OPHTHALMIC | Status: DC | PRN
  Administered 2019-06-20: 0.2 mL via OPHTHALMIC

## 2019-06-20 MED ORDER — ACETAMINOPHEN 160 MG/5ML PO SOLN: 325.0000 mg | ORAL | Status: DC | PRN

## 2019-06-20 MED ORDER — SODIUM HYALURONATE 23 MG/ML IO SOLN
INTRAOCULAR | Status: DC | PRN
  Administered 2019-06-20: 0.6 mL via INTRAOCULAR

## 2019-06-20 MED ORDER — MIDAZOLAM HCL 2 MG/2ML IJ SOLN
INTRAMUSCULAR | Status: DC | PRN
  Administered 2019-06-20 (×2): 1 mg via INTRAVENOUS

## 2019-06-20 MED ORDER — ONDANSETRON HCL 4 MG/2ML IJ SOLN: 4.0000 mg | Freq: Once | INTRAMUSCULAR | Status: DC | PRN

## 2019-06-20 MED ORDER — ARMC OPHTHALMIC DILATING DROPS
1.0000 "application " | OPHTHALMIC | Status: DC | PRN
  Administered 2019-06-20 (×3): 1 via OPHTHALMIC

## 2019-06-20 MED ORDER — EPINEPHRINE PF 1 MG/ML IJ SOLN
INTRAOCULAR | Status: DC | PRN
  Administered 2019-06-20: 74 mL via OPHTHALMIC

## 2019-06-20 MED ORDER — LIDOCAINE HCL (PF) 2 % IJ SOLN
INTRAOCULAR | Status: DC | PRN
  Administered 2019-06-20: 1 mL via INTRAOCULAR

## 2019-06-20 MED ORDER — ACETAMINOPHEN 325 MG PO TABS: 325.0000 mg | ORAL_TABLET | ORAL | Status: DC | PRN

## 2019-06-20 MED ORDER — SODIUM HYALURONATE 10 MG/ML IO SOLN
INTRAOCULAR | Status: DC | PRN
  Administered 2019-06-20: 0.55 mL via INTRAOCULAR

## 2019-06-20 MED ORDER — FENTANYL CITRATE (PF) 100 MCG/2ML IJ SOLN
INTRAMUSCULAR | Status: DC | PRN
  Administered 2019-06-20: 50 ug via INTRAVENOUS

## 2019-06-20 SURGICAL SUPPLY — 20 items
CANNULA ANT/CHMB 27G (MISCELLANEOUS) ×2 IMPLANT
CANNULA ANT/CHMB 27GA (MISCELLANEOUS) ×6 IMPLANT
DISSECTOR HYDRO NUCLEUS 50X22 (MISCELLANEOUS) ×3 IMPLANT
GLOVE SURG LX 7.5 STRW (GLOVE) ×2
GLOVE SURG LX STRL 7.5 STRW (GLOVE) ×1 IMPLANT
GLOVE SURG SYN 8.5  E (GLOVE) ×2
GLOVE SURG SYN 8.5 E (GLOVE) ×1 IMPLANT
GLOVE SURG SYN 8.5 PF PI (GLOVE) ×1 IMPLANT
GOWN STRL REUS W/ TWL LRG LVL3 (GOWN DISPOSABLE) ×2 IMPLANT
GOWN STRL REUS W/TWL LRG LVL3 (GOWN DISPOSABLE) ×4
LENS IOL DIOP 21.5 (Intraocular Lens) ×3 IMPLANT
LENS IOL TECNIS MONO 21.5 (Intraocular Lens) IMPLANT
MARKER SKIN DUAL TIP RULER LAB (MISCELLANEOUS) ×3 IMPLANT
PACK DR. KING ARMS (PACKS) ×3 IMPLANT
PACK EYE AFTER SURG (MISCELLANEOUS) ×3 IMPLANT
PACK OPTHALMIC (MISCELLANEOUS) ×3 IMPLANT
SYR 3ML LL SCALE MARK (SYRINGE) ×3 IMPLANT
SYR TB 1ML LUER SLIP (SYRINGE) ×3 IMPLANT
WATER STERILE IRR 250ML POUR (IV SOLUTION) ×3 IMPLANT
WIPE NON LINTING 3.25X3.25 (MISCELLANEOUS) ×3 IMPLANT

## 2019-06-20 NOTE — Transfer of Care (Signed)
Immediate Anesthesia Transfer of Care Note  Patient: [name]  Procedure(s) Performed: CATARACT EXTRACTION PHACO AND INTRAOCULAR LENS PLACEMENT (IOC) LEFT DIABETIC 2.58  00:29.3 (Left Eye)  Patient Location: PACU  Anesthesia Type: MAC  Level of Consciousness: awake, alert  and patient cooperative  Airway and Oxygen Therapy: Patient Spontanous Breathing and Patient connected to supplemental oxygen  Post-op Assessment: Post-op Vital signs reviewed, Patient's Cardiovascular Status Stable, Respiratory Function Stable, Patent Airway and No signs of Nausea or vomiting  Post-op Vital Signs: Reviewed and stable  Complications: No complications documented.

## 2019-06-20 NOTE — Anesthesia Preprocedure Evaluation (Addendum)
Anesthesia Evaluation  Patient identified by MRN, date of birth, ID band Patient awake    Reviewed: Allergy & Precautions, H&P , NPO status   History of Anesthesia Complications (+) PONV and history of anesthetic complications  Airway Mallampati: II  TM Distance: >3 FB Neck ROM: full    Dental no notable dental hx.    Pulmonary neg pulmonary ROS,    breath sounds clear to auscultation       Cardiovascular Exercise Tolerance: Good hypertension,  Rhythm:regular Rate:Normal  HLD   Neuro/Psych  Headaches, PSYCHIATRIC DISORDERS Depression HOH    GI/Hepatic GERD  ,Ischemic colitis s/p colectomy 2013   Endo/Other  diabetes, Type 2BMI 30  Renal/GU Renal disease (nephrolithiasis)  negative genitourinary   Musculoskeletal   Abdominal   Peds  Hematology Hx PE   Anesthesia Other Findings Cardiology note 02/14/19:  ASSESSMENT:  1. Chest pain of uncertain etiology  2. Essential hypertension  3. Pure hypercholesterolemia   PLAN:  In order of problems listed above:  1. Cardiogram showed normal ejection fraction with EF of 60 to 90%, normal diastolic function.  Coronary CTA reviewed by myself showed no evidence for coronary artery disease.  Patient reassured.  Also complains of heartburn symptoms.  Advised patient to increase omeprazole to 40 mg daily x2 weeks and see if symptoms improve.  If symptoms improve she should continue at the dose of 40 mg.  If symptoms status same with atypical chest pain she can back to 20 mg daily. 2. Blood pressure controlled today.  Continue Toprol-XL and amlodipine 2.5 mg daily.   3. He has history of hyperlipidemia, continue Lipitor.  Follow-up as needed   Reproductive/Obstetrics negative OB ROS                            Anesthesia Physical  Anesthesia Plan  ASA: III  Anesthesia Plan: MAC   Post-op Pain Management:    Induction:   PONV Risk Score and  Plan: Treatment may vary due to age or medical condition, TIVA and Midazolam  Airway Management Planned: Nasal Cannula and Natural Airway  Additional Equipment:   Intra-op Plan:   Post-operative Plan:   Informed Consent: I have reviewed the patients History and Physical, chart, labs and discussed the procedure including the risks, benefits and alternatives for the proposed anesthesia with the patient or authorized representative who has indicated his/her understanding and acceptance.       Plan Discussed with: CRNA  Anesthesia Plan Comments:         Anesthesia Quick Evaluation

## 2019-06-20 NOTE — Op Note (Signed)
OPERATIVE NOTE  [name] [id number] 06/20/2019   PREOPERATIVE DIAGNOSIS:  Nuclear sclerotic cataract left eye.  H25.12   POSTOPERATIVE DIAGNOSIS:    Nuclear sclerotic cataract left eye.     PROCEDURE:  Phacoemusification with posterior chamber intraocular lens placement of the left eye   LENS:   Implant Name Type Inv. Item Serial No. Manufacturer Lot No. LRB No. Used Action  LENS IOL DIOP 21.5 - [id number] Intraocular Lens LENS IOL DIOP 21.5 [id number] AMO  Left 1 Implanted      Procedure(s) with comments: CATARACT EXTRACTION PHACO AND INTRAOCULAR LENS PLACEMENT (IOC) LEFT DIABETIC 2.58  00:29.3 (Left) - Diabetic  DCB00 +21.5   ULTRASOUND TIME: 0 minutes 29 seconds.  CDE 2.58   SURGEON:  Benay Pillow, MD, MPH   ANESTHESIA:  Topical with tetracaine drops augmented with 1% preservative-free intracameral lidocaine.  ESTIMATED BLOOD LOSS: <1 mL   COMPLICATIONS:  None.   DESCRIPTION OF PROCEDURE:  The patient was identified in the holding room and transported to the operating room and placed in the supine position under the operating microscope.  The left eye was identified as the operative eye and it was prepped and draped in the usual sterile ophthalmic fashion.   A 1.0 millimeter clear-corneal paracentesis was made at the 5:00 position. 0.5 ml of preservative-free 1% lidocaine with epinephrine was injected into the anterior chamber.  The anterior chamber was filled with Healon 5 viscoelastic.  A 2.4 millimeter keratome was used to make a near-clear corneal incision at the 2:00 position.  A curvilinear capsulorrhexis was made with a cystotome and capsulorrhexis forceps.  Balanced salt solution was used to hydrodissect and hydrodelineate the nucleus.   Phacoemulsification was then used in stop and chop fashion to remove the lens nucleus and epinucleus.  The remaining cortex was then removed using the irrigation and aspiration handpiece. Healon was then placed into the capsular bag  to distend it for lens placement.  A lens was then injected into the capsular bag.  The remaining viscoelastic was aspirated.   Wounds were hydrated with balanced salt solution.  The anterior chamber was inflated to a physiologic pressure with balanced salt solution.  Intracameral vigamox 0.1 mL undiltued was injected into the eye and a drop placed onto the ocular surface.  No wound leaks were noted.  The patient was taken to the recovery room in stable condition without complications of anesthesia or surgery  Benay Pillow 06/20/2019, 10:01 AM

## 2019-06-20 NOTE — Anesthesia Procedure Notes (Signed)
Procedure Name: MAC Date/Time: 06/20/2019 9:39 AM Performed by: Cameron Ali, CRNA Pre-anesthesia Checklist: Patient identified, Emergency Drugs available, Suction available, Timeout performed and Patient being monitored Patient Re-evaluated:Patient Re-evaluated prior to induction Oxygen Delivery Method: Nasal cannula Placement Confirmation: positive ETCO2

## 2019-06-20 NOTE — H&P (Signed)

## 2019-06-20 NOTE — Anesthesia Postprocedure Evaluation (Signed)
Anesthesia Post Note  Patient: [name]  Procedure(s) Performed: CATARACT EXTRACTION PHACO AND INTRAOCULAR LENS PLACEMENT (IOC) LEFT DIABETIC 2.58  00:29.3 (Left Eye)     Patient location during evaluation: PACU Anesthesia Type: MAC Level of consciousness: awake Pain management: pain level controlled Vital Signs Assessment: post-procedure vital signs reviewed and stable Respiratory status: respiratory function stable Cardiovascular status: stable Postop Assessment: no apparent nausea or vomiting Anesthetic complications: no   No complications documented.  Veda Canning

## 2019-07-19 ENCOUNTER — Other Ambulatory Visit: Payer: Self-pay

## 2019-07-19 ENCOUNTER — Other Ambulatory Visit (INDEPENDENT_AMBULATORY_CARE_PROVIDER_SITE_OTHER): Payer: PPO

## 2019-07-19 DIAGNOSIS — Z7901 Long term (current) use of anticoagulants: Secondary | ICD-10-CM

## 2019-07-20 LAB — PROTIME-INR
INR: 4.9 — ABNORMAL HIGH
Prothrombin Time: 48.3 s — ABNORMAL HIGH (ref 9.0–11.5)

## 2019-07-21 ENCOUNTER — Other Ambulatory Visit: Payer: Self-pay | Admitting: Internal Medicine

## 2019-07-21 MED ORDER — WARFARIN SODIUM 5 MG PO TABS: 5.0000 mg | ORAL_TABLET | Freq: Every day | ORAL | 3 refills | Status: DC

## 2019-07-24 ENCOUNTER — Other Ambulatory Visit: Payer: Self-pay

## 2019-07-24 DIAGNOSIS — F409 Phobic anxiety disorder, unspecified: Secondary | ICD-10-CM

## 2019-07-25 ENCOUNTER — Other Ambulatory Visit: Payer: Self-pay | Admitting: Internal Medicine

## 2019-07-25 MED ORDER — ALPRAZOLAM 0.25 MG PO TABS: 0.2500 mg | ORAL_TABLET | Freq: Every evening | ORAL | 0 refills | Status: DC | PRN

## 2019-07-25 NOTE — Telephone Encounter (Signed)
Spoke with pt and scheduled her a 6 month follow up with Dr. Derrel Nip. Pt is aware of appt date and time.

## 2019-07-25 NOTE — Telephone Encounter (Signed)
Refill request for xanax, last seen 12-13-18, last filled 05-12-19.  Please advise.

## 2019-07-25 NOTE — Telephone Encounter (Signed)
Please notify patient that the prescription  was Refilled for 30 days only because it has been 7 months since last visit. Marland Kitchen  OFFICE VISIT NEEDED prior to any more refills

## 2019-08-01 ENCOUNTER — Other Ambulatory Visit: Payer: Self-pay

## 2019-08-01 ENCOUNTER — Other Ambulatory Visit (INDEPENDENT_AMBULATORY_CARE_PROVIDER_SITE_OTHER): Payer: PPO

## 2019-08-01 DIAGNOSIS — Z7901 Long term (current) use of anticoagulants: Secondary | ICD-10-CM | POA: Diagnosis not present

## 2019-08-01 LAB — PROTIME-INR
INR: 1.6 ratio — ABNORMAL HIGH (ref 0.8–1.0)
Prothrombin Time: 17.9 s — ABNORMAL HIGH (ref 9.6–13.1)

## 2019-08-08 ENCOUNTER — Other Ambulatory Visit: Payer: Self-pay

## 2019-08-08 ENCOUNTER — Other Ambulatory Visit (INDEPENDENT_AMBULATORY_CARE_PROVIDER_SITE_OTHER): Payer: PPO

## 2019-08-08 ENCOUNTER — Ambulatory Visit (INDEPENDENT_AMBULATORY_CARE_PROVIDER_SITE_OTHER): Payer: PPO

## 2019-08-08 ENCOUNTER — Ambulatory Visit (INDEPENDENT_AMBULATORY_CARE_PROVIDER_SITE_OTHER): Payer: PPO | Admitting: Internal Medicine

## 2019-08-08 ENCOUNTER — Encounter: Payer: Self-pay | Admitting: Internal Medicine

## 2019-08-08 VITALS — BP 172/100 | HR 73 | Temp 98.2°F | Resp 16 | Ht 61.0 in | Wt 167.0 lb

## 2019-08-08 DIAGNOSIS — E1169 Type 2 diabetes mellitus with other specified complication: Secondary | ICD-10-CM

## 2019-08-08 DIAGNOSIS — E119 Type 2 diabetes mellitus without complications: Secondary | ICD-10-CM | POA: Diagnosis not present

## 2019-08-08 DIAGNOSIS — E1129 Type 2 diabetes mellitus with other diabetic kidney complication: Secondary | ICD-10-CM | POA: Diagnosis not present

## 2019-08-08 DIAGNOSIS — R61 Generalized hyperhidrosis: Secondary | ICD-10-CM

## 2019-08-08 DIAGNOSIS — I10 Essential (primary) hypertension: Secondary | ICD-10-CM

## 2019-08-08 DIAGNOSIS — R809 Proteinuria, unspecified: Secondary | ICD-10-CM

## 2019-08-08 DIAGNOSIS — E785 Hyperlipidemia, unspecified: Secondary | ICD-10-CM | POA: Diagnosis not present

## 2019-08-08 DIAGNOSIS — Z7901 Long term (current) use of anticoagulants: Secondary | ICD-10-CM | POA: Diagnosis not present

## 2019-08-08 DIAGNOSIS — R05 Cough: Secondary | ICD-10-CM | POA: Diagnosis not present

## 2019-08-08 LAB — PROTIME-INR
INR: 2.5 ratio — ABNORMAL HIGH (ref 0.8–1.0)
Prothrombin Time: 27.2 s — ABNORMAL HIGH (ref 9.6–13.1)

## 2019-08-08 NOTE — Patient Instructions (Addendum)
Increase the amlodipine to 5 mg daily and continue metoprolol and telmisartan  If you develop edema  In ankles  Let me know and we'll change meds    NO BAREFOOT WALKING OUTSIDE!  TOO MANY BACTERIA IN ANIMAL URINE THAT CAN CAUSE BAD INFECTIONS IN HUMANS   Managing Your Hypertension Hypertension is commonly called high blood pressure. This is when the force of your blood pressing against the walls of your arteries is too strong. Arteries are blood vessels that carry blood from your heart throughout your body. Hypertension forces the heart to work harder to pump blood, and may cause the arteries to become narrow or stiff. Having untreated or uncontrolled hypertension can cause heart attack, stroke, kidney disease, and other problems. What are blood pressure readings? A blood pressure reading consists of a higher number over a lower number. Ideally, your blood pressure should be below 120/80. The first ("top") number is called the systolic pressure. It is a measure of the pressure in your arteries as your heart beats. The second ("bottom") number is called the diastolic pressure. It is a measure of the pressure in your arteries as the heart relaxes. What does my blood pressure reading mean? Blood pressure is classified into four stages. Based on your blood pressure reading, your health care provider may use the following stages to determine what type of treatment you need, if any. Systolic pressure and diastolic pressure are measured in a unit called mm Hg. Normal  Systolic pressure: below 024.  Diastolic pressure: below 80. Elevated  Systolic pressure: 097-353.  Diastolic pressure: below 80. Hypertension stage 1  Systolic pressure: 299-242.  Diastolic pressure: 68-34. Hypertension stage 2  Systolic pressure: 196 or above.  Diastolic pressure: 90 or above. What health risks are associated with hypertension? Managing your hypertension is an important responsibility. Uncontrolled  hypertension can lead to:  A heart attack.  A stroke.  A weakened blood vessel (aneurysm).  Heart failure.  Kidney damage.  Eye damage.  Metabolic syndrome.  Memory and concentration problems. What changes can I make to manage my hypertension? Hypertension can be managed by making lifestyle changes and possibly by taking medicines. Your health care provider will help you make a plan to bring your blood pressure within a normal range. Eating and drinking   Eat a diet that is high in fiber and potassium, and low in salt (sodium), added sugar, and fat. An example eating plan is called the DASH (Dietary Approaches to Stop Hypertension) diet. To eat this way: ? Eat plenty of fresh fruits and vegetables. Try to fill half of your plate at each meal with fruits and vegetables. ? Eat whole grains, such as whole wheat pasta, brown rice, or whole grain bread. Fill about one quarter of your plate with whole grains. ? Eat low-fat diary products. ? Avoid fatty cuts of meat, processed or cured meats, and poultry with skin. Fill about one quarter of your plate with lean proteins such as fish, chicken without skin, beans, eggs, and tofu. ? Avoid premade and processed foods. These tend to be higher in sodium, added sugar, and fat.  Reduce your daily sodium intake. Most people with hypertension should eat less than 1,500 mg of sodium a day.  Limit alcohol intake to no more than 1 drink a day for nonpregnant women and 2 drinks a day for men. One drink equals 12 oz of beer, 5 oz of wine, or 1 oz of hard liquor. Lifestyle  Work with your health care  provider to maintain a healthy body weight, or to lose weight. Ask what an ideal weight is for you.  Get at least 30 minutes of exercise that causes your heart to beat faster (aerobic exercise) most days of the week. Activities may include walking, swimming, or biking.  Include exercise to strengthen your muscles (resistance exercise), such as weight  lifting, as part of your weekly exercise routine. Try to do these types of exercises for 30 minutes at least 3 days a week.  Do not use any products that contain nicotine or tobacco, such as cigarettes and e-cigarettes. If you need help quitting, ask your health care provider.  Control any long-term (chronic) conditions you have, such as high cholesterol or diabetes. Monitoring  Monitor your blood pressure at home as told by your health care provider. Your personal target blood pressure may vary depending on your medical conditions, your age, and other factors.  Have your blood pressure checked regularly, as often as told by your health care provider. Working with your health care provider  Review all the medicines you take with your health care provider because there may be side effects or interactions.  Talk with your health care provider about your diet, exercise habits, and other lifestyle factors that may be contributing to hypertension.  Visit your health care provider regularly. Your health care provider can help you create and adjust your plan for managing hypertension. Will I need medicine to control my blood pressure? Your health care provider may prescribe medicine if lifestyle changes are not enough to get your blood pressure under control, and if:  Your systolic blood pressure is 130 or higher.  Your diastolic blood pressure is 80 or higher. Take medicines only as told by your health care provider. Follow the directions carefully. Blood pressure medicines must be taken as prescribed. The medicine does not work as well when you skip doses. Skipping doses also puts you at risk for problems. Contact a health care provider if:  You think you are having a reaction to medicines you have taken.  You have repeated (recurrent) headaches.  You feel dizzy.  You have swelling in your ankles.  You have trouble with your vision. Get help right away if:  You develop a severe  headache or confusion.  You have unusual weakness or numbness, or you feel faint.  You have severe pain in your chest or abdomen.  You vomit repeatedly.  You have trouble breathing. Summary  Hypertension is when the force of blood pumping through your arteries is too strong. If this condition is not controlled, it may put you at risk for serious complications.  Your personal target blood pressure may vary depending on your medical conditions, your age, and other factors. For most people, a normal blood pressure is less than 120/80.  Hypertension is managed by lifestyle changes, medicines, or both. Lifestyle changes include weight loss, eating a healthy, low-sodium diet, exercising more, and limiting alcohol. This information is not intended to replace advice given to you by your health care provider. Make sure you discuss any questions you have with your health care provider. Document Revised: 04/16/2018 Document Reviewed: 11/21/2015 Elsevier Patient Education  [place].

## 2019-08-08 NOTE — Progress Notes (Signed)
Subjective:  Patient ID: [name], female    DOB: Mar 04, 1950  Age: 69 y.o. MRN: [id number]  CC: The primary encounter diagnosis was Night sweats. Diagnoses of Diabetes mellitus type 2, diet-controlled ([place]), Hyperlipidemia associated with type 2 diabetes mellitus ([place]), Controlled type 2 diabetes mellitus with microalbuminuria, without long-term current use of insulin ([place]), Essential hypertension, Microalbuminuria due to type 2 diabetes mellitus ([place]), and Morbid obesity ([place]) were also pertinent to this visit.  HPI [name] presents for follow up on chronic conditions, including type 2 DM, hypertension     HTN:  She has been having elevated blood pressure readings both in office and at home .  Taking medications as directed.   Type 2 DM  Not checking BS at all.    Taking care of husband full time.  Not eating right.  Weight gain.    Sweating more than usual ,  Drenching  Night sweats  . Has een having a dry cough at night while supine for the last 3-4 months .  Has chronic bloating and constipation alternating with diarrhe a  Outpatient Medications Prior to Visit  Medication Sig Dispense Refill  . ALPRAZolam (XANAX) 0.25 MG tablet Take 1 tablet (0.25 mg total) by mouth at bedtime as needed for anxiety. 30 tablet 0  . amLODipine (NORVASC) 2.5 MG tablet Take 1 tablet (2.5 mg total) by mouth daily. 90 tablet 3  . aspirin 81 MG tablet Take 81 mg by mouth daily.    Marland Kitchen atorvastatin (LIPITOR) 40 MG tablet Take 1 tablet by mouth once daily 90 tablet 0  . buPROPion (WELLBUTRIN) 75 MG tablet Take 1 tablet (75 mg total) by mouth 2 (two) times daily. 60 tablet 11  . butalbital-acetaminophen-caffeine (FIORICET) 50-325-40 MG tablet TAKE 1 TABLET BY MOUTH EVERY  12 HOURS AS NEEDED FOR HEADACHE 60 tablet 5  . Cholecalciferol (VITAMIN D3) 1000 UNITS CAPS Take by mouth.    . cyanocobalamin (,VITAMIN B-12,) 1000 MCG/ML injection INJECT 1 ML (1,000 MCG TOTAL) INTO THE MUSCLE EVERY 30 DAYS. 10 mL 0    . DULoxetine (CYMBALTA) 20 MG capsule Take 1 capsule by mouth once daily 90 capsule 0  . furosemide (LASIX) 20 MG tablet Take 1 tablet by mouth once daily 90 tablet 0  . glucose blood test strip Use to check blood sugar once daily. 100 each 5  . metoprolol succinate (TOPROL-XL) 100 MG 24 hr tablet TAKE 1 TABLET BY MOUTH AT BEDTIME 90 tablet 0  . nitroGLYCERIN (NITROSTAT) 0.4 MG SL tablet Place 1 tablet (0.4 mg total) under the tongue every 5 (five) minutes as needed for chest pain. 50 tablet 3  . Omega-3 Fatty Acids (FISH OIL) 1000 MG CAPS Take 1 capsule daily by mouth.    Glory Rosebush Delica Lancets 96G MISC Use to check blood sugar once daily. 100 each 5  . Syringe/Needle, Disp, (SYRINGE 3CC/25GX1") 25G X 1" 3 ML MISC Use for b12 injections 50 each 0  . telmisartan (MICARDIS) 80 MG tablet Take 1 tablet by mouth once daily 90 tablet 0  . warfarin (COUMADIN) 5 MG tablet Take 1 tablet (5 mg total) by mouth daily. 30 tablet 3  . omeprazole (PRILOSEC) 20 MG capsule Take 20 mg by mouth daily.    . traMADol (ULTRAM) 50 MG tablet Take 1 tablet (50 mg total) by mouth every 8 (eight) hours as needed. (Patient not taking: Reported on 08/08/2019) 90 tablet 4  . imipramine (TOFRANIL) 25 MG tablet Take  1 tablet (25 mg total) by mouth at bedtime. (Patient not taking: Reported on 05/19/2019) 90 tablet 0   Facility-Administered Medications Prior to Visit  Medication Dose Route Frequency Provider Last Rate Last Admin  . cyanocobalamin ((VITAMIN B-12)) injection 1,000 mcg  1,000 mcg Intramuscular Once Crecencio Mc, MD        Review of Systems;  Patient denies headache, fevers, malaise, unintentional weight loss, skin rash, eye pain, sinus congestion and sinus pain, sore throat, dysphagia,  hemoptysis , cough, dyspnea, wheezing, chest pain, palpitations, orthopnea, edema, abdominal pain, nausea, melena, diarrhea, constipation, flank pain, dysuria, hematuria, urinary  Frequency, nocturia, numbness, tingling,  seizures,  Focal weakness, Loss of consciousness,  Tremor, insomnia, depression, anxiety, and suicidal ideation.      Objective:  BP (!) 172/100 (BP Location: Left Arm, Patient Position: Sitting, Cuff Size: Large)   Pulse 73   Temp 98.2 F (36.8 C) (Oral)   Resp 16   Ht 5\' 1"  (1.549 m)   Wt 167 lb (75.8 kg)   SpO2 94%   BMI 31.55 kg/m   BP Readings from Last 3 Encounters:  08/08/19 (!) 172/100  06/20/19 (!) 150/79  05/30/19 122/80    Wt Readings from Last 3 Encounters:  08/08/19 167 lb (75.8 kg)  06/20/19 164 lb (74.4 kg)  05/30/19 164 lb (74.4 kg)    General appearance: alert, cooperative and appears stated age Ears: normal TM's and external ear canals both ears Throat: lips, mucosa, and tongue normal; teeth and gums normal Neck: no adenopathy, no carotid bruit, supple, symmetrical, trachea midline and thyroid not enlarged, symmetric, no tenderness/mass/nodules Back: symmetric, no curvature. ROM normal. No CVA tenderness. Lungs: clear to auscultation bilaterally Heart: regular rate and rhythm, S1, S2 normal, no murmur, click, rub or gallop Abdomen: soft, non-tender; bowel sounds normal; no masses,  no organomegaly Pulses: 2+ and symmetric Skin: Skin color, texture, turgor normal. No rashes or lesions Lymph nodes: Cervical, supraclavicular, and axillary nodes normal.  Lab Results  Component Value Date   HGBA1C 6.3 (H) 08/08/2019   HGBA1C 6.3 11/30/2018   HGBA1C 6.1 (H) 04/28/2018    Lab Results  Component Value Date   CREATININE 1.00 01/20/2019   CREATININE 0.91 11/30/2018   CREATININE 0.99 04/28/2018    Lab Results  Component Value Date   WBC 5.5 08/08/2019   HGB 14.1 08/08/2019   HCT 43.6 08/08/2019   PLT 178 08/08/2019   GLUCOSE 99 11/30/2018   CHOL 176 08/08/2019   TRIG 176 (H) 08/08/2019   HDL 54 08/08/2019   LDLDIRECT 111.0 08/13/2016   LDLCALC 92 08/08/2019   ALT 18 11/30/2018   AST 18 11/30/2018   NA 138 11/30/2018   K 4.5 11/30/2018   CL  102 11/30/2018   CREATININE 1.00 01/20/2019   BUN 18 11/30/2018   CO2 31 11/30/2018   TSH 2.62 11/18/2016   INR 2.5 (H) 08/08/2019   HGBA1C 6.3 (H) 08/08/2019   MICROALBUR 2.2 (H) 11/13/2017    No results found.  Assessment & Plan:   Problem List Items Addressed This Visit      Unprioritized   Controlled type 2 diabetes mellitus with microalbuminuria, without long-term current use of insulin ([place])    She has not been  following a  low glycemic index diet but her diabetes remains   diet controlled.     She is taking  a baby  aspirin and atorvastatin, and telmisartan  daily   Lab Results  Component Value Date  HGBA1C 6.3 (H) 08/08/2019   Lab Results  Component Value Date   MICROALBUR 2.2 (H) 11/13/2017           Hyperlipidemia associated with type 2 diabetes mellitus ([facility])    LDL and triglycerides are at goal on atorvastatin 40 mg dose . She has no side effects and liver enzymes are normal. No changes today .  Lab Results  Component Value Date   CHOL 176 08/08/2019   HDL 54 08/08/2019   LDLCALC 92 08/08/2019   LDLDIRECT 111.0 08/13/2016   TRIG 176 (H) 08/08/2019   CHOLHDL 3.3 08/08/2019   Lab Results  Component Value Date   ALT 18 11/30/2018   AST 18 11/30/2018   ALKPHOS 101 11/30/2018   BILITOT 0.7 11/30/2018             Relevant Orders   Lipid panel (Completed)   Hypertension    Readings are elevated both in office and at home..  Increase amlodipine to 5 mg . Continue telmisartan , metoprolol .      Microalbuminuria due to type 2 diabetes mellitus ([place])    Continue telmisartan      Morbid obesity ([place])    With hypertension and diabetes.  I have addressed  BMI and recommended a low glycemic index diet utilizing smaller more frequent meals to increase metabolism.  I have also recommended that patient start exercising with a goal of 30 minutes of aerobic exercise a minimum of 5 days per week.      Night sweats - Primary    Accompanied by  nocturnal cough. Lung fields are clear and CBC  Normal.  ECHO normal as well.  Will treat for GERD and attribute sweats to menopause,  not a candidate for HRT  Lab Results  Component Value Date   WBC 5.5 08/08/2019   HGB 14.1 08/08/2019   HCT 43.6 08/08/2019   MCV 88 08/08/2019   PLT 178 08/08/2019         Relevant Orders   DG Chest 2 View (Completed)   CBC with Differential/Platelet (Completed)      I provided  30 minutes of  face-to-face time during this encounter reviewing patient's current problems and past surgeries, labs and imaging studies, providing counseling on the above mentioned problems , and coordination  of care . I have discontinued [name]'s imipramine. I have also changed her omeprazole. Additionally, I am having her maintain her Vitamin D3, aspirin, traMADol, Fish Oil, SYRINGE 3CC/25GX1", glucose blood, OneTouch Delica Lancets 74J, amLODipine, nitroGLYCERIN, buPROPion, butalbital-acetaminophen-caffeine, cyanocobalamin, furosemide, metoprolol succinate, telmisartan, warfarin, ALPRAZolam, atorvastatin, and DULoxetine. We will continue to administer cyanocobalamin.  Meds ordered this encounter  Medications  . omeprazole (PRILOSEC) 20 MG capsule    Sig: Take 1 capsule (20 mg total) by mouth 2 (two) times daily before a meal.    Dispense:  60 capsule    Refill:  2    Medications Discontinued During This Encounter  Medication Reason  . imipramine (TOFRANIL) 25 MG tablet Patient has not taken in last 30 days  . omeprazole (PRILOSEC) 20 MG capsule Reorder    Follow-up: Return in about 6 months (around 02/08/2020) for follow up diabetes.   Crecencio Mc, MD

## 2019-08-09 DIAGNOSIS — R61 Generalized hyperhidrosis: Secondary | ICD-10-CM | POA: Insufficient documentation

## 2019-08-09 LAB — MICROALBUMIN / CREATININE URINE RATIO
Creatinine, Urine: 137.4 mg/dL
Microalb/Creat Ratio: 11 mg/g creat (ref 0–29)
Microalbumin, Urine: 15.5 ug/mL

## 2019-08-09 LAB — HEMOGLOBIN A1C
Est. average glucose Bld gHb Est-mCnc: 134 mg/dL
Hgb A1c MFr Bld: 6.3 % — ABNORMAL HIGH (ref 4.8–5.6)

## 2019-08-09 LAB — CBC WITH DIFFERENTIAL/PLATELET
Basophils Absolute: 0.1 10*3/uL (ref 0.0–0.2)
Basos: 1 %
EOS (ABSOLUTE): 0.2 10*3/uL (ref 0.0–0.4)
Eos: 4 %
Hematocrit: 43.6 % (ref 34.0–46.6)
Hemoglobin: 14.1 g/dL (ref 11.1–15.9)
Immature Grans (Abs): 0 10*3/uL (ref 0.0–0.1)
Immature Granulocytes: 0 %
Lymphocytes Absolute: 2.3 10*3/uL (ref 0.7–3.1)
Lymphs: 42 %
MCH: 28.4 pg (ref 26.6–33.0)
MCHC: 32.3 g/dL (ref 31.5–35.7)
MCV: 88 fL (ref 79–97)
Monocytes Absolute: 0.5 10*3/uL (ref 0.1–0.9)
Monocytes: 9 %
Neutrophils Absolute: 2.5 10*3/uL (ref 1.4–7.0)
Neutrophils: 44 %
Platelets: 178 10*3/uL (ref 150–450)
RBC: 4.96 x10E6/uL (ref 3.77–5.28)
RDW: 13.3 % (ref 11.7–15.4)
WBC: 5.5 10*3/uL (ref 3.4–10.8)

## 2019-08-09 LAB — LIPID PANEL
Chol/HDL Ratio: 3.3 ratio (ref 0.0–4.4)
Cholesterol, Total: 176 mg/dL (ref 100–199)
HDL: 54 mg/dL (ref >39)
LDL Chol Calc (NIH): 92 mg/dL (ref 0–99)
Triglycerides: 176 mg/dL — ABNORMAL HIGH (ref 0–149)
VLDL Cholesterol Cal: 30 mg/dL (ref 5–40)

## 2019-08-09 MED ORDER — OMEPRAZOLE 20 MG PO CPDR: 20.0000 mg | DELAYED_RELEASE_CAPSULE | Freq: Two times a day (BID) | ORAL | 2 refills | Status: DC

## 2019-08-09 NOTE — Assessment & Plan Note (Signed)
With hypertension and diabetes.  I have addressed  BMI and recommended a low glycemic index diet utilizing smaller more frequent meals to increase metabolism.  I have also recommended that patient start exercising with a goal of 30 minutes of aerobic exercise a minimum of 5 days per week.

## 2019-08-09 NOTE — Assessment & Plan Note (Signed)
LDL and triglycerides are at goal on atorvastatin 40 mg dose . She has no side effects and liver enzymes are normal. No changes today .  Lab Results  Component Value Date   CHOL 176 08/08/2019   HDL 54 08/08/2019   LDLCALC 92 08/08/2019   LDLDIRECT 111.0 08/13/2016   TRIG 176 (H) 08/08/2019   CHOLHDL 3.3 08/08/2019   Lab Results  Component Value Date   ALT 18 11/30/2018   AST 18 11/30/2018   ALKPHOS 101 11/30/2018   BILITOT 0.7 11/30/2018

## 2019-08-09 NOTE — Assessment & Plan Note (Addendum)
Readings are elevated both in office and at home..  Increase amlodipine to 5 mg . Continue telmisartan , metoprolol .

## 2019-08-09 NOTE — Assessment & Plan Note (Signed)
She has not been  following a  low glycemic index diet but her diabetes remains   diet controlled.     She is taking  a baby  aspirin and atorvastatin, and telmisartan  daily   Lab Results  Component Value Date   HGBA1C 6.3 (H) 08/08/2019   Lab Results  Component Value Date   MICROALBUR 2.2 (H) 11/13/2017

## 2019-08-09 NOTE — Assessment & Plan Note (Signed)
Accompanied by nocturnal cough. Lung fields are clear and CBC  Normal.  ECHO normal as well.  Will treat for GERD and attribute sweats to menopause,  not a candidate for HRT  Lab Results  Component Value Date   WBC 5.5 08/08/2019   HGB 14.1 08/08/2019   HCT 43.6 08/08/2019   MCV 88 08/08/2019   PLT 178 08/08/2019

## 2019-08-09 NOTE — Assessment & Plan Note (Signed)
Continue telmisartan. 

## 2019-08-29 ENCOUNTER — Other Ambulatory Visit: Payer: Self-pay | Admitting: Internal Medicine

## 2019-10-03 ENCOUNTER — Other Ambulatory Visit: Payer: Self-pay | Admitting: Internal Medicine

## 2019-10-05 ENCOUNTER — Other Ambulatory Visit: Payer: Self-pay

## 2019-10-05 ENCOUNTER — Other Ambulatory Visit (INDEPENDENT_AMBULATORY_CARE_PROVIDER_SITE_OTHER): Payer: PPO

## 2019-10-05 DIAGNOSIS — Z23 Encounter for immunization: Secondary | ICD-10-CM

## 2019-10-05 DIAGNOSIS — Z7901 Long term (current) use of anticoagulants: Secondary | ICD-10-CM | POA: Diagnosis not present

## 2019-10-05 LAB — PROTIME-INR
INR: 2.5 ratio — ABNORMAL HIGH (ref 0.8–1.0)
Prothrombin Time: 28 s — ABNORMAL HIGH (ref 9.6–13.1)

## 2019-10-18 ENCOUNTER — Other Ambulatory Visit: Payer: Self-pay | Admitting: Internal Medicine

## 2019-10-18 DIAGNOSIS — F409 Phobic anxiety disorder, unspecified: Secondary | ICD-10-CM

## 2019-10-27 ENCOUNTER — Other Ambulatory Visit: Payer: Self-pay | Admitting: Family Medicine

## 2019-10-31 ENCOUNTER — Other Ambulatory Visit: Payer: Self-pay | Admitting: Internal Medicine

## 2019-11-20 ENCOUNTER — Other Ambulatory Visit: Payer: Self-pay | Admitting: Internal Medicine

## 2019-11-20 DIAGNOSIS — F5105 Insomnia due to other mental disorder: Secondary | ICD-10-CM

## 2019-11-20 DIAGNOSIS — F409 Phobic anxiety disorder, unspecified: Secondary | ICD-10-CM

## 2019-11-23 ENCOUNTER — Telehealth: Payer: Self-pay

## 2019-11-23 DIAGNOSIS — Z7901 Long term (current) use of anticoagulants: Secondary | ICD-10-CM

## 2019-11-23 NOTE — Telephone Encounter (Signed)
Lab order placed for lab appt to have INR rechecked.

## 2019-11-24 ENCOUNTER — Other Ambulatory Visit (INDEPENDENT_AMBULATORY_CARE_PROVIDER_SITE_OTHER): Payer: PPO

## 2019-11-24 ENCOUNTER — Other Ambulatory Visit: Payer: Self-pay

## 2019-11-24 DIAGNOSIS — Z7901 Long term (current) use of anticoagulants: Secondary | ICD-10-CM

## 2019-11-24 LAB — PROTIME-INR
INR: 2.8 ratio — ABNORMAL HIGH (ref 0.8–1.0)
Prothrombin Time: 30.5 s — ABNORMAL HIGH (ref 9.6–13.1)

## 2019-11-25 ENCOUNTER — Other Ambulatory Visit: Payer: Self-pay | Admitting: Internal Medicine

## 2019-11-25 NOTE — Progress Notes (Signed)
Coumadin level is therapeutic,  Continue current regimen and repeat PT/INR in one month 

## 2019-11-29 ENCOUNTER — Other Ambulatory Visit: Payer: Self-pay

## 2019-11-29 ENCOUNTER — Ambulatory Visit (INDEPENDENT_AMBULATORY_CARE_PROVIDER_SITE_OTHER): Payer: PPO | Admitting: Internal Medicine

## 2019-11-29 ENCOUNTER — Encounter: Payer: Self-pay | Admitting: Internal Medicine

## 2019-11-29 VITALS — BP 146/80 | HR 69 | Temp 98.6°F | Resp 15 | Ht 61.0 in | Wt 169.6 lb

## 2019-11-29 DIAGNOSIS — Z1231 Encounter for screening mammogram for malignant neoplasm of breast: Secondary | ICD-10-CM

## 2019-11-29 DIAGNOSIS — F4321 Adjustment disorder with depressed mood: Secondary | ICD-10-CM

## 2019-11-29 DIAGNOSIS — N644 Mastodynia: Secondary | ICD-10-CM | POA: Diagnosis not present

## 2019-11-29 DIAGNOSIS — I700 Atherosclerosis of aorta: Secondary | ICD-10-CM

## 2019-11-29 MED ORDER — METOPROLOL SUCCINATE ER 100 MG PO TB24
100.0000 mg | ORAL_TABLET | Freq: Every day | ORAL | 1 refills | Status: DC
Start: 2019-11-29 — End: 2020-05-23

## 2019-11-29 MED ORDER — WARFARIN SODIUM 6 MG PO TABS: 6.0000 mg | ORAL_TABLET | Freq: Every day | ORAL | 0 refills | Status: DC

## 2019-11-29 NOTE — Patient Instructions (Signed)
.  Know that you are not alone , and neither is [name]  May you feel the blessing of our God, the love of Jesus Christ, and the comfort of the [employer] this week and in the weeks to come.

## 2019-11-29 NOTE — Progress Notes (Signed)
Subjective:  Patient ID: [name], female    DOB: 10-Nov-1950  Age: 69 y.o. MRN: [id number]  CC: The primary encounter diagnosis was Breast pain, left. Diagnoses of Grief, Breast cancer screening by mammogram, and Aortic arch atherosclerosis ([place]) were also pertinent to this visit.  HPI [name] presents for evaluation of left breast pain  This visit occurred during the SARS-CoV-2 public health emergency.  Safety protocols were in place, including screening questions prior to the visit, additional usage of staff PPE, and extensive cleaning of exam room while observing appropriate contact time as indicated for disinfecting solutions.   Last mammogram August 2020. Patient tried to schedule her screening mammogram but answered "yes" to the question of whether she had breast pain .  She has been having diffuse left sided chest wall pain that starts at her collarbone /trapezius muscle and has been present for several weeks /months,  Aggravated by the physical labor she has participated in during the care of her husband became paralyzed from supranuclear palsy and recently passed away  Chest x ray done in August 2021 noted normal lung fields.  Aortic atherosclerosis noted and discussed today,  She is taking lipitor   Grief:  Her Husband  Tommy died December 08, 2022 of progressive supranuclear palsy .  He passed at home with hospice and family present .  61 yr relationship  Outpatient Medications Prior to Visit  Medication Sig Dispense Refill  . ALPRAZolam (XANAX) 0.25 MG tablet TAKE 1 TABLET BY MOUTH AT BEDTIME AS NEEDED FOR ANXIETY 30 tablet 0  . amLODipine (NORVASC) 2.5 MG tablet Take 1 tablet by mouth once daily 90 tablet 0  . aspirin 81 MG tablet Take 81 mg by mouth daily.    Marland Kitchen atorvastatin (LIPITOR) 40 MG tablet Take 1 tablet by mouth once daily 90 tablet 0  . buPROPion (WELLBUTRIN) 75 MG tablet Take 1 tablet (75 mg total) by mouth 2 (two) times daily. (Patient taking differently: Take 75 mg by  mouth daily. ) 60 tablet 11  . butalbital-acetaminophen-caffeine (FIORICET) 50-325-40 MG tablet TAKE 1 TABLET BY MOUTH EVERY  12 HOURS AS NEEDED FOR HEADACHE 60 tablet 5  . Cholecalciferol (VITAMIN D3) 1000 UNITS CAPS Take by mouth.    . cyanocobalamin (,VITAMIN B-12,) 1000 MCG/ML injection INJECT 1 ML (1,000 MCG TOTAL) INTO THE MUSCLE EVERY 30 DAYS. 10 mL 0  . DULoxetine (CYMBALTA) 20 MG capsule Take 1 capsule by mouth once daily 90 capsule 0  . furosemide (LASIX) 20 MG tablet Take 1 tablet by mouth once daily 90 tablet 0  . glucose blood test strip Use to check blood sugar once daily. 100 each 5  . nitroGLYCERIN (NITROSTAT) 0.4 MG SL tablet Place 1 tablet (0.4 mg total) under the tongue every 5 (five) minutes as needed for chest pain. 50 tablet 3  . Omega-3 Fatty Acids (FISH OIL) 1000 MG CAPS Take 1 capsule daily by mouth.    Marland Kitchen omeprazole (PRILOSEC) 20 MG capsule Take 1 capsule (20 mg total) by mouth 2 (two) times daily before a meal. 60 capsule 2  . OneTouch Delica Lancets 74Q MISC Use to check blood sugar once daily. 100 each 5  . Syringe/Needle, Disp, (SYRINGE 3CC/25GX1") 25G X 1" 3 ML MISC Use for b12 injections 50 each 0  . telmisartan (MICARDIS) 80 MG tablet Take 1 tablet by mouth once daily 90 tablet 0  . traMADol (ULTRAM) 50 MG tablet Take 1 tablet (50 mg total) by mouth every  8 (eight) hours as needed. 90 tablet 4  . metoprolol succinate (TOPROL-XL) 100 MG 24 hr tablet TAKE 1 TABLET BY MOUTH AT BEDTIME 90 tablet 0  . warfarin (COUMADIN) 5 MG tablet Take 1 tablet (5 mg total) by mouth daily. (Patient not taking: Reported on 11/29/2019) 30 tablet 3   Facility-Administered Medications Prior to Visit  Medication Dose Route Frequency Provider Last Rate Last Admin  . cyanocobalamin ((VITAMIN B-12)) injection 1,000 mcg  1,000 mcg Intramuscular Once Crecencio Mc, MD        Review of Systems;  Patient denies headache, fevers, malaise, unintentional weight loss, skin rash, eye pain, sinus  congestion and sinus pain, sore throat, dysphagia,  hemoptysis , cough, dyspnea, wheezing, chest pain, palpitations, orthopnea, edema, abdominal pain, nausea, melena, diarrhea, constipation, flank pain, dysuria, hematuria, urinary  Frequency, nocturia, numbness, tingling, seizures,  Focal weakness, Loss of consciousness,  Tremor, insomnia, depression, anxiety, and suicidal ideation.      Objective:  BP (!) 146/80 (BP Location: Left Arm, Patient Position: Sitting, Cuff Size: Large)   Pulse 69   Temp 98.6 F (37 C) (Oral)   Resp 15   Ht 5\' 1"  (1.549 m)   Wt 169 lb 9.6 oz (76.9 kg)   SpO2 97%   BMI 32.05 kg/m   BP Readings from Last 3 Encounters:  11/29/19 (!) 146/80  08/08/19 (!) 172/100  06/20/19 (!) 150/79    Wt Readings from Last 3 Encounters:  11/29/19 169 lb 9.6 oz (76.9 kg)  08/08/19 167 lb (75.8 kg)  06/20/19 164 lb (74.4 kg)    General appearance: alert, cooperative and appears stated age Ears: normal TM's and external ear canals both ears Throat: lips, mucosa, and tongue normal; teeth and gums normal Neck: no adenopathy, no carotid bruit, supple, symmetrical, trachea midline and thyroid not enlarged, symmetric, no tenderness/mass/nodules Back: symmetric, no curvature. ROM normal. No CVA tenderness. Lungs: clear to auscultation bilaterally Heart: regular rate and rhythm, S1, S2 normal, no murmur, click, rub or gallop Abdomen: soft, non-tender; bowel sounds normal; no masses,  no organomegaly Pulses: 2+ and symmetric Skin: Skin color, texture, turgor normal. No rashes or lesions Lymph nodes: Cervical, supraclavicular, and axillary nodes normal.  Lab Results  Component Value Date   HGBA1C 6.3 (H) 08/08/2019   HGBA1C 6.3 11/30/2018   HGBA1C 6.1 (H) 04/28/2018    Lab Results  Component Value Date   CREATININE 1.00 01/20/2019   CREATININE 0.91 11/30/2018   CREATININE 0.99 04/28/2018    Lab Results  Component Value Date   WBC 5.5 08/08/2019   HGB 14.1  08/08/2019   HCT 43.6 08/08/2019   PLT 178 08/08/2019   GLUCOSE 99 11/30/2018   CHOL 176 08/08/2019   TRIG 176 (H) 08/08/2019   HDL 54 08/08/2019   LDLDIRECT 111.0 08/13/2016   LDLCALC 92 08/08/2019   ALT 18 11/30/2018   AST 18 11/30/2018   NA 138 11/30/2018   K 4.5 11/30/2018   CL 102 11/30/2018   CREATININE 1.00 01/20/2019   BUN 18 11/30/2018   CO2 31 11/30/2018   TSH 2.62 11/18/2016   INR 2.8 (H) 11/24/2019   HGBA1C 6.3 (H) 08/08/2019   MICROALBUR 2.2 (H) 11/13/2017    No results found.  Assessment & Plan:   Problem List Items Addressed This Visit      Unprioritized   Grief    Patient is dealing with the recent  loss of spouse and has adequate coping skills and emotional support .  I have asked patient to return in one month to examine for signs of unresolving grief.       Breast cancer screening by mammogram - Primary    Her screening mammogram was deferred because of her report of breast pain,  But she has no suspicious mass on exam. Will reorder a screening mammogram       Relevant Orders   MM 3D SCREEN BREAST BILATERAL   Aortic arch atherosclerosis ([place])     Aortic arch atherosclerosis:  Reviewed findings of prior chest x ray today..  Patient  Is  taking a  High potency statin       Relevant Medications   metoprolol succinate (TOPROL-XL) 100 MG 24 hr tablet   warfarin (COUMADIN) 6 MG tablet     I provided  30 minutes of  face-to-face time during this encounter reviewing patient's current problems and past surgeries, labs and imaging studies, providing counseling on the above mentioned problems , and coordination  of care .  I have discontinued Hadlie F. Real's warfarin. I have also changed her metoprolol succinate. Additionally, I am having her start on warfarin. Lastly, I am having her maintain her Vitamin D3, aspirin, traMADol, Fish Oil, SYRINGE 3CC/25GX1", glucose blood, OneTouch Delica Lancets 46K, nitroGLYCERIN, buPROPion,  butalbital-acetaminophen-caffeine, cyanocobalamin, omeprazole, furosemide, telmisartan, atorvastatin, DULoxetine, amLODipine, and ALPRAZolam. We will continue to administer cyanocobalamin.  Meds ordered this encounter  Medications  . metoprolol succinate (TOPROL-XL) 100 MG 24 hr tablet    Sig: Take 1 tablet (100 mg total) by mouth at bedtime. Take with or immediately following a meal.    Dispense:  90 tablet    Refill:  1  . warfarin (COUMADIN) 6 MG tablet    Sig: Take 1 tablet (6 mg total) by mouth daily.    Dispense:  90 tablet    Refill:  0    Medications Discontinued During This Encounter  Medication Reason  . metoprolol succinate (TOPROL-XL) 100 MG 24 hr tablet Reorder  . warfarin (COUMADIN) 5 MG tablet Change in therapy    Follow-up: No follow-ups on file.   Crecencio Mc, MD

## 2019-11-30 DIAGNOSIS — I700 Atherosclerosis of aorta: Secondary | ICD-10-CM | POA: Insufficient documentation

## 2019-11-30 DIAGNOSIS — Z1231 Encounter for screening mammogram for malignant neoplasm of breast: Secondary | ICD-10-CM | POA: Insufficient documentation

## 2019-11-30 DIAGNOSIS — F4321 Adjustment disorder with depressed mood: Secondary | ICD-10-CM | POA: Insufficient documentation

## 2019-11-30 NOTE — Assessment & Plan Note (Addendum)
  Aortic arch atherosclerosis:  Reviewed findings of prior chest x ray today..  Patient  Is  taking a  High potency statin

## 2019-11-30 NOTE — Assessment & Plan Note (Addendum)
Her screening mammogram was deferred because of her report of breast pain,  But she has no suspicious mass on exam. Will reorder a screening mammogram

## 2019-11-30 NOTE — Assessment & Plan Note (Signed)
Patient is dealing with the recent  loss of spouse and has adequate coping skills and emotional support .  I have asked patient to return in one month to examine for signs of unresolving grief.

## 2019-12-14 DIAGNOSIS — S8012XA Contusion of left lower leg, initial encounter: Secondary | ICD-10-CM | POA: Diagnosis not present

## 2020-01-02 DIAGNOSIS — S8012XD Contusion of left lower leg, subsequent encounter: Secondary | ICD-10-CM | POA: Diagnosis not present

## 2020-01-13 ENCOUNTER — Ambulatory Visit
Admission: RE | Admit: 2020-01-13 | Discharge: 2020-01-13 | Disposition: A | Discharge status: Home / Self Care | Payer: PPO | Source: Ambulatory Visit | Attending: Internal Medicine | Admitting: Internal Medicine

## 2020-01-13 ENCOUNTER — Ambulatory Visit (INDEPENDENT_AMBULATORY_CARE_PROVIDER_SITE_OTHER): Payer: PPO | Admitting: Internal Medicine

## 2020-01-13 ENCOUNTER — Other Ambulatory Visit: Payer: Self-pay

## 2020-01-13 ENCOUNTER — Encounter: Payer: Self-pay | Admitting: Internal Medicine

## 2020-01-13 VITALS — BP 112/62 | HR 66 | Temp 98.2°F | Ht 61.0 in | Wt 168.0 lb

## 2020-01-13 DIAGNOSIS — Z1231 Encounter for screening mammogram for malignant neoplasm of breast: Secondary | ICD-10-CM | POA: Diagnosis not present

## 2020-01-13 DIAGNOSIS — I700 Atherosclerosis of aorta: Secondary | ICD-10-CM

## 2020-01-13 DIAGNOSIS — R809 Proteinuria, unspecified: Secondary | ICD-10-CM

## 2020-01-13 DIAGNOSIS — E785 Hyperlipidemia, unspecified: Secondary | ICD-10-CM

## 2020-01-13 DIAGNOSIS — Z7901 Long term (current) use of anticoagulants: Secondary | ICD-10-CM | POA: Diagnosis not present

## 2020-01-13 DIAGNOSIS — F4321 Adjustment disorder with depressed mood: Secondary | ICD-10-CM | POA: Diagnosis not present

## 2020-01-13 DIAGNOSIS — E1169 Type 2 diabetes mellitus with other specified complication: Secondary | ICD-10-CM | POA: Diagnosis not present

## 2020-01-13 DIAGNOSIS — S83249A Other tear of medial meniscus, current injury, unspecified knee, initial encounter: Secondary | ICD-10-CM | POA: Insufficient documentation

## 2020-01-13 DIAGNOSIS — E1129 Type 2 diabetes mellitus with other diabetic kidney complication: Secondary | ICD-10-CM | POA: Diagnosis not present

## 2020-01-13 LAB — COMPREHENSIVE METABOLIC PANEL
ALT: 19 U/L (ref 0–35)
AST: 17 U/L (ref 0–37)
Albumin: 4.3 g/dL (ref 3.5–5.2)
Alkaline Phosphatase: 93 U/L (ref 39–117)
BUN: 16 mg/dL (ref 6–23)
CO2: 33 mEq/L — ABNORMAL HIGH (ref 19–32)
Calcium: 8.9 mg/dL (ref 8.4–10.5)
Chloride: 103 mEq/L (ref 96–112)
Creatinine, Ser: 1.05 mg/dL (ref 0.40–1.20)
GFR: 53.73 mL/min — ABNORMAL LOW (ref >60.00)
Glucose, Bld: 108 mg/dL — ABNORMAL HIGH (ref 70–99)
Potassium: 4.2 mEq/L (ref 3.5–5.1)
Sodium: 140 mEq/L (ref 135–145)
Total Bilirubin: 0.7 mg/dL (ref 0.2–1.2)
Total Protein: 6.2 g/dL (ref 6.0–8.3)

## 2020-01-13 LAB — LIPID PANEL
Cholesterol: 162 mg/dL (ref 0–200)
HDL: 48.6 mg/dL (ref >39.00)
LDL Cholesterol: 87 mg/dL (ref 0–99)
NonHDL: 112.94
Total CHOL/HDL Ratio: 3
Triglycerides: 132 mg/dL (ref 0.0–149.0)
VLDL: 26.4 mg/dL (ref 0.0–40.0)

## 2020-01-13 LAB — PROTIME-INR
INR: 2.6 ratio — ABNORMAL HIGH (ref 0.8–1.0)
Prothrombin Time: 28.7 s — ABNORMAL HIGH (ref 9.6–13.1)

## 2020-01-13 LAB — HEMOGLOBIN A1C: Hgb A1c MFr Bld: 6.2 % (ref 4.6–6.5)

## 2020-01-13 NOTE — Patient Instructions (Addendum)
Go to the website [email or web address] for morning devotionals    It belongs to a [employer] : [employer]   TRAZODONE START WITH 25 MG ONE HOUR BEFORE BEDTIME  OK TO ADD ALPRAZOLAM IF STILL WIDE AWAKE IN ONE HOUR   YOU CAN GRADUALLY INCREASE TRAZODONE TO 100 MG MAX.   Give the grief counselling a try, even if it's on zoom   FOCUS ON THE GOOD YEARS YOU HAD WITH TOMMY , NOT THE LAST YEAR

## 2020-01-14 NOTE — Progress Notes (Signed)
Your INR/coumadin level is therapeutic,  Continue current regimen .  Your diabetes remains under excellent control  And your cholesterol and other labs are also normal. Please continue your current medications. return in 6 months for follow up on diabetes and make sure you are seeing your eye doctor at least once a year.  Regards,   Deborra Medina, MD

## 2020-01-15 NOTE — Assessment & Plan Note (Addendum)
INR is at goal on current regimen of 6 mg daily . No changes today  Lab Results  Component Value Date   INR 2.6 (H) 01/13/2020   INR 2.8 (H) 11/24/2019   INR 2.5 (H) 10/05/2019   PROTIME 57.9 (A) 02/17/2014

## 2020-01-15 NOTE — Assessment & Plan Note (Signed)
She is tolerating High potency statin and LDL is < 100.  No changes today   Lab Results  Component Value Date   CHOL 162 01/13/2020   HDL 48.60 01/13/2020   LDLCALC 87 01/13/2020   LDLDIRECT 111.0 08/13/2016   TRIG 132.0 01/13/2020   CHOLHDL 3 01/13/2020   Lab Results  Component Value Date   ALT 19 01/13/2020   AST 17 01/13/2020   ALKPHOS 93 01/13/2020   BILITOT 0.7 01/13/2020

## 2020-01-15 NOTE — Assessment & Plan Note (Signed)
her diabetes remains   diet controlled.     She is taking  a baby  aspirin and atorvastatin, and telmisartan  daily   Lab Results  Component Value Date   HGBA1C 6.2 01/13/2020   Lab Results  Component Value Date   MICROALBUR 2.2 (H) 11/13/2017

## 2020-01-15 NOTE — Progress Notes (Signed)
Subjective:  Patient ID: [name], female    DOB: Dec 24, 1950  Age: 70 y.o. MRN: [id number]  CC: The primary encounter diagnosis was Controlled type 2 diabetes mellitus with microalbuminuria, without long-term current use of insulin ([place]). Diagnoses of Hyperlipidemia associated with type 2 diabetes mellitus ([place]), Long term current use of anticoagulant therapy, Aortic arch atherosclerosis ([place]), and Grief were also pertinent to this visit.  HPI SAMIA KUKLA presents for follow up on type 2 DM. Hypertension, and hyperlipidemia  This visit occurred during the SARS-CoV-2 public health emergency.  Safety protocols were in place, including screening questions prior to the visit, additional usage of staff PPE, and extensive cleaning of exam room while observing appropriate contact time as indicated for disinfecting solutions.   She is grieving the loss of her beloved husband and lifetime companion since high school, Tommy  To Progressive Supranuclear Palsy.  The majority of our visit today was spend exploring  her grief and evaluating her emotional state.  She has been devastated by her loss and not sleeping well or eating .  Not checking blood sugars  Taking meds,  But not getting much done at home.  She spent the holidays with her sons and daughters.     Outpatient Medications Prior to Visit  Medication Sig Dispense Refill  . ALPRAZolam (XANAX) 0.25 MG tablet TAKE 1 TABLET BY MOUTH AT BEDTIME AS NEEDED FOR ANXIETY 30 tablet 0  . amLODipine (NORVASC) 2.5 MG tablet Take 1 tablet by mouth once daily 90 tablet 0  . aspirin 81 MG tablet Take 81 mg by mouth daily.    Marland Kitchen atorvastatin (LIPITOR) 40 MG tablet Take 1 tablet by mouth once daily 90 tablet 0  . buPROPion (WELLBUTRIN) 75 MG tablet Take 1 tablet (75 mg total) by mouth 2 (two) times daily. (Patient taking differently: Take 75 mg by mouth daily.) 60 tablet 11  . butalbital-acetaminophen-caffeine (FIORICET) 50-325-40 MG tablet TAKE 1 TABLET BY  MOUTH EVERY  12 HOURS AS NEEDED FOR HEADACHE 60 tablet 5  . Cholecalciferol (VITAMIN D3) 1000 UNITS CAPS Take by mouth.    . cyanocobalamin (,VITAMIN B-12,) 1000 MCG/ML injection INJECT 1 ML (1,000 MCG TOTAL) INTO THE MUSCLE EVERY 30 DAYS. 10 mL 0  . DULoxetine (CYMBALTA) 20 MG capsule Take 1 capsule by mouth once daily 90 capsule 0  . furosemide (LASIX) 20 MG tablet Take 1 tablet by mouth once daily 90 tablet 0  . glucose blood test strip Use to check blood sugar once daily. 100 each 5  . metoprolol succinate (TOPROL-XL) 100 MG 24 hr tablet Take 1 tablet (100 mg total) by mouth at bedtime. Take with or immediately following a meal. 90 tablet 1  . nitroGLYCERIN (NITROSTAT) 0.4 MG SL tablet Place 1 tablet (0.4 mg total) under the tongue every 5 (five) minutes as needed for chest pain. 50 tablet 3  . Omega-3 Fatty Acids (FISH OIL) 1000 MG CAPS Take 1 capsule daily by mouth.    Marland Kitchen omeprazole (PRILOSEC) 20 MG capsule Take 1 capsule (20 mg total) by mouth 2 (two) times daily before a meal. 60 capsule 2  . OneTouch Delica Lancets 33I MISC Use to check blood sugar once daily. 100 each 5  . Syringe/Needle, Disp, (SYRINGE 3CC/25GX1") 25G X 1" 3 ML MISC Use for b12 injections 50 each 0  . telmisartan (MICARDIS) 80 MG tablet Take 1 tablet by mouth once daily 90 tablet 0  . traMADol (ULTRAM) 50 MG tablet Take 1  tablet (50 mg total) by mouth every 8 (eight) hours as needed. 90 tablet 4  . warfarin (COUMADIN) 6 MG tablet Take 1 tablet (6 mg total) by mouth daily. 90 tablet 0   Facility-Administered Medications Prior to Visit  Medication Dose Route Frequency Provider Last Rate Last Admin  . cyanocobalamin ((VITAMIN B-12)) injection 1,000 mcg  1,000 mcg Intramuscular Once Crecencio Mc, MD        Review of Systems;  Patient denies headache, fevers, malaise, unintentional weight loss, skin rash, eye pain, sinus congestion and sinus pain, sore throat, dysphagia,  hemoptysis , cough, dyspnea, wheezing, chest  pain, palpitations, orthopnea, edema, abdominal pain, nausea, melena, diarrhea, constipation, flank pain, dysuria, hematuria, urinary  Frequency, nocturia, numbness, tingling, seizures,  Focal weakness, Loss of consciousness,  Tremor, insomnia, depression, anxiety, and suicidal ideation.      Objective:  BP 112/62 (BP Location: Left Arm, Patient Position: Sitting, Cuff Size: Normal)   Pulse 66   Temp 98.2 F (36.8 C) (Oral)   Ht 5\' 1"  (1.549 m)   Wt 168 lb (76.2 kg)   SpO2 95%   BMI 31.74 kg/m   BP Readings from Last 3 Encounters:  01/13/20 112/62  11/29/19 (!) 146/80  08/08/19 (!) 172/100    Wt Readings from Last 3 Encounters:  01/13/20 168 lb (76.2 kg)  11/29/19 169 lb 9.6 oz (76.9 kg)  08/08/19 167 lb (75.8 kg)    General appearance: alert, cooperative and appears stated age Ears: normal TM's and external ear canals both ears Throat: lips, mucosa, and tongue normal; teeth and gums normal Neck: no adenopathy, no carotid bruit, supple, symmetrical, trachea midline and thyroid not enlarged, symmetric, no tenderness/mass/nodules Back: symmetric, no curvature. ROM normal. No CVA tenderness. Lungs: clear to auscultation bilaterally Heart: regular rate and rhythm, S1, S2 normal, no murmur, click, rub or gallop Abdomen: soft, non-tender; bowel sounds normal; no masses,  no organomegaly Pulses: 2+ and symmetric Skin: Skin color, texture, turgor normal. No rashes or lesions Lymph nodes: Cervical, supraclavicular, and axillary nodes normal.  Lab Results  Component Value Date   HGBA1C 6.2 01/13/2020   HGBA1C 6.3 (H) 08/08/2019   HGBA1C 6.3 11/30/2018    Lab Results  Component Value Date   CREATININE 1.05 01/13/2020   CREATININE 1.00 01/20/2019   CREATININE 0.91 11/30/2018    Lab Results  Component Value Date   WBC 5.5 08/08/2019   HGB 14.1 08/08/2019   HCT 43.6 08/08/2019   PLT 178 08/08/2019   GLUCOSE 108 (H) 01/13/2020   CHOL 162 01/13/2020   TRIG 132.0  01/13/2020   HDL 48.60 01/13/2020   LDLDIRECT 111.0 08/13/2016   LDLCALC 87 01/13/2020   ALT 19 01/13/2020   AST 17 01/13/2020   NA 140 01/13/2020   K 4.2 01/13/2020   CL 103 01/13/2020   CREATININE 1.05 01/13/2020   BUN 16 01/13/2020   CO2 33 (H) 01/13/2020   TSH 2.62 11/18/2016   INR 2.6 (H) 01/13/2020   HGBA1C 6.2 01/13/2020   MICROALBUR 2.2 (H) 11/13/2017    MM 3D SCREEN BREAST BILATERAL  Result Date: 01/13/2020 CLINICAL DATA:  Screening. EXAM: DIGITAL SCREENING BILATERAL MAMMOGRAM WITH TOMO AND CAD COMPARISON:  Previous exam(s). ACR Breast Density Category b: There are scattered areas of fibroglandular density. FINDINGS: There are no findings suspicious for malignancy. Images were processed with CAD. IMPRESSION: No mammographic evidence of malignancy. A result letter of this screening mammogram will be mailed directly to the patient. RECOMMENDATION: Screening mammogram  in one year. (Code:SM-B-01Y) BI-RADS CATEGORY  1: Negative. Electronically Signed   By: Audie Pinto M.D.   On: 01/13/2020 12:56    Assessment & Plan:   Problem List Items Addressed This Visit      Unprioritized   Aortic arch atherosclerosis ([place])    She is tolerating High potency statin and LDL is < 100.  No changes today   Lab Results  Component Value Date   CHOL 162 01/13/2020   HDL 48.60 01/13/2020   LDLCALC 87 01/13/2020   LDLDIRECT 111.0 08/13/2016   TRIG 132.0 01/13/2020   CHOLHDL 3 01/13/2020   Lab Results  Component Value Date   ALT 19 01/13/2020   AST 17 01/13/2020   ALKPHOS 93 01/13/2020   BILITOT 0.7 01/13/2020         Controlled type 2 diabetes mellitus with microalbuminuria, without long-term current use of insulin ([place]) - Primary     her diabetes remains   diet controlled.     She is taking  a baby  aspirin and atorvastatin, and telmisartan  daily   Lab Results  Component Value Date   HGBA1C 6.2 01/13/2020   Lab Results  Component Value Date   MICROALBUR 2.2 (H)  11/13/2017           Relevant Orders   Hemoglobin A1c (Completed)   Comprehensive metabolic panel (Completed)   Grief    Patient remains despondent the loss of her lifetime spouse but  has adequate coping skills and emotional support .  Adding trazodone for insomnia and prn alprazolam for short term use.  I have asked patient to return in one month to examine for signs of unresolving grief.       Hyperlipidemia associated with type 2 diabetes mellitus ([place])   Relevant Orders   Lipid panel (Completed)   Long term current use of anticoagulant therapy    INR is at goal on current regimen of 6 mg daily . No changes today  Lab Results  Component Value Date   INR 2.6 (H) 01/13/2020   INR 2.8 (H) 11/24/2019   INR 2.5 (H) 10/05/2019   PROTIME 57.9 (A) 02/17/2014         Relevant Orders   Protime-INR (Completed)      I am having Han F. Scantling maintain her Vitamin D3, aspirin, traMADol, Fish Oil, SYRINGE 3CC/25GX1", glucose blood, OneTouch Delica Lancets 99991111, nitroGLYCERIN, buPROPion, butalbital-acetaminophen-caffeine, cyanocobalamin, omeprazole, furosemide, telmisartan, atorvastatin, DULoxetine, amLODipine, ALPRAZolam, metoprolol succinate, and warfarin. We will continue to administer cyanocobalamin.  No orders of the defined types were placed in this encounter.   There are no discontinued medications.  Follow-up: No follow-ups on file.   Crecencio Mc, MD

## 2020-01-15 NOTE — Assessment & Plan Note (Addendum)
Patient remains despondent the loss of her lifetime spouse but  has adequate coping skills and emotional support .  Adding trazodone for insomnia and prn alprazolam for short term use.  I have asked patient to return in one month to examine for signs of unresolving grief.

## 2020-01-16 ENCOUNTER — Telehealth: Payer: Self-pay | Admitting: Internal Medicine

## 2020-01-16 MED ORDER — FUROSEMIDE 20 MG PO TABS: 20.0000 mg | ORAL_TABLET | Freq: Every day | ORAL | 0 refills | Status: DC

## 2020-01-16 MED ORDER — TRAZODONE HCL 50 MG PO TABS: 50.0000 mg | ORAL_TABLET | Freq: Every day | ORAL | 1 refills | Status: DC

## 2020-01-16 NOTE — Telephone Encounter (Signed)
Trazodone  Ad furosemide sent to [facility]

## 2020-01-16 NOTE — Telephone Encounter (Signed)
Pt saw Dr. Derrel Nip on 01/13/20 and said that a rx for Trazodone was to be called in and that she also needs the furosemide (LASIX) 20 MG tablet

## 2020-01-16 NOTE — Addendum Note (Signed)
Addended by: Crecencio Mc on: 01/16/2020 12:44 PM   Modules accepted: Orders

## 2020-01-26 ENCOUNTER — Other Ambulatory Visit: Payer: Self-pay | Admitting: Family Medicine

## 2020-01-26 ENCOUNTER — Other Ambulatory Visit: Payer: Self-pay | Admitting: Internal Medicine

## 2020-02-09 ENCOUNTER — Other Ambulatory Visit: Payer: Self-pay | Admitting: Family Medicine

## 2020-02-10 ENCOUNTER — Telehealth: Payer: Self-pay | Admitting: Internal Medicine

## 2020-02-10 NOTE — Telephone Encounter (Signed)
Left a message to call back.

## 2020-02-10 NOTE — Telephone Encounter (Signed)
Yes,  ua with micro  And  urine culture

## 2020-02-10 NOTE — Telephone Encounter (Signed)
Pt called she thinks that she has a UTI and wanted to schedule an appt with Dr. Derrel Nip only and wanted to come in and do a urine

## 2020-02-10 NOTE — Telephone Encounter (Signed)
Left message to call back and schedule an appointment .

## 2020-02-13 ENCOUNTER — Other Ambulatory Visit: Payer: Self-pay | Admitting: Internal Medicine

## 2020-02-15 ENCOUNTER — Ambulatory Visit (INDEPENDENT_AMBULATORY_CARE_PROVIDER_SITE_OTHER): Payer: PPO

## 2020-02-15 ENCOUNTER — Other Ambulatory Visit: Payer: Self-pay

## 2020-02-15 ENCOUNTER — Encounter: Payer: Self-pay | Admitting: Internal Medicine

## 2020-02-15 ENCOUNTER — Ambulatory Visit (INDEPENDENT_AMBULATORY_CARE_PROVIDER_SITE_OTHER): Payer: PPO | Admitting: Internal Medicine

## 2020-02-15 VITALS — BP 156/98 | HR 59 | Temp 98.5°F | Ht 61.0 in | Wt 169.6 lb

## 2020-02-15 DIAGNOSIS — N3000 Acute cystitis without hematuria: Secondary | ICD-10-CM

## 2020-02-15 DIAGNOSIS — M5137 Other intervertebral disc degeneration, lumbosacral region: Secondary | ICD-10-CM

## 2020-02-15 DIAGNOSIS — R350 Frequency of micturition: Secondary | ICD-10-CM | POA: Diagnosis not present

## 2020-02-15 DIAGNOSIS — R791 Abnormal coagulation profile: Secondary | ICD-10-CM | POA: Diagnosis not present

## 2020-02-15 DIAGNOSIS — N3281 Overactive bladder: Secondary | ICD-10-CM

## 2020-02-15 DIAGNOSIS — R32 Unspecified urinary incontinence: Secondary | ICD-10-CM | POA: Diagnosis not present

## 2020-02-15 DIAGNOSIS — F4321 Adjustment disorder with depressed mood: Secondary | ICD-10-CM | POA: Diagnosis not present

## 2020-02-15 DIAGNOSIS — M545 Low back pain, unspecified: Secondary | ICD-10-CM

## 2020-02-15 LAB — PROTIME-INR
INR: 2.8 ratio — ABNORMAL HIGH (ref 0.8–1.0)
Prothrombin Time: 31.3 s — ABNORMAL HIGH (ref 9.6–13.1)

## 2020-02-15 MED ORDER — OXYBUTYNIN CHLORIDE ER 5 MG PO TB24: 5.0000 mg | ORAL_TABLET | Freq: Every day | ORAL | 0 refills | Status: DC

## 2020-02-15 NOTE — Progress Notes (Signed)
Chief Complaint  Patient presents with  . Over Active Bladder   F/u  1. Low back pain mild to moderate intermittent CT prior with DDD lumbar sacral L5-S1  2. Overactive bladder and increased frequency at night x 5-7 months sister has same issues and tried oxybutynin and helped her. She does not drink much caffeine kegels did not work in the past 3. Grief husband died 2019/12/03 married 56 years and 3 kids starting grief counseling 03/10/20 through her chuch 4. Elevated INR needs INR repeat today as well    Review of Systems  Constitutional: Negative for weight loss.  HENT: Negative for hearing loss.   Eyes: Negative for blurred vision.  Respiratory: Negative for shortness of breath.   Cardiovascular: Negative for chest pain.  Gastrointestinal: Negative for abdominal pain.  Genitourinary: Positive for frequency and urgency.  Musculoskeletal: Positive for back pain.  Psychiatric/Behavioral:       +grief    Past Medical History:  Diagnosis Date  . B12 deficiency   . Clotting disorder ([place])   . Depression   . Diabetes mellitus without complication ([place])    pre diabetic  . GERD (gastroesophageal reflux disease)   . H/O toxic shock syndrome 2001   post urologic procedure for stone removal  . Headache    migraines  . 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 HOH (hard of hearing)    wears hearing aids  . Hyperlipidemia   . Hypertension   . Ischemic colitis ([place]) 2012-2013   s/p left colectomy May 2013  . Ischemic colitis ([place])   . Kidney stones   . PONV (postoperative nausea and vomiting)    Past Surgical History:  Procedure Laterality Date  . ABDOMINAL HYSTERECTOMY  1996  . APPENDECTOMY    . bilateral tubal ligation    . BREAST EXCISIONAL BIOPSY Left 1989   benign  . BREAST SURGERY  1989   left biopsy, normal  . CATARACT EXTRACTION W/PHACO Right 05/30/2019   Procedure: CATARACT EXTRACTION PHACO AND INTRAOCULAR LENS  PLACEMENT (IOC) RIGHT DIABETIC;  Surgeon: Eulogio Bear, MD;  Location: [facility];  Service: Ophthalmology;  Laterality: Right;  3.84 0:37.1  . CATARACT EXTRACTION W/PHACO Left 06/20/2019   Procedure: CATARACT EXTRACTION PHACO AND INTRAOCULAR LENS PLACEMENT (IOC) LEFT DIABETIC 2.58  00:29.3;  Surgeon: Eulogio Bear, MD;  Location: [facility];  Service: Ophthalmology;  Laterality: Left;  Diabetic  . COLECTOMY Left May 2013   [place], ischemic colitis  . COLONOSCOPY WITH PROPOFOL N/A 11/16/2014   Procedure: COLONOSCOPY WITH PROPOFOL;  Surgeon: Manya Silvas, MD;  Location: [facility] ENDOSCOPY;  Service: Endoscopy;  Laterality: N/A;  . ESOPHAGOGASTRODUODENOSCOPY (EGD) WITH PROPOFOL N/A 11/16/2014   Procedure: ESOPHAGOGASTRODUODENOSCOPY (EGD) WITH PROPOFOL;  Surgeon: Manya Silvas, MD;  Location[facility] ENDOSCOPY;  Service: Endoscopy;  Laterality: N/A;  . SPINE SURGERY  1984   lumbar diskectomy, [place]  . ureterolithotomy     Dr. Rogers Blocker urology ~ 2012 kidney stones in ICU after surgery   Family History  Problem Relation Age of Onset  . Hypertension Mother   . Cancer Mother   . Diabetes Mother   . Heart disease Father   . Hypertension Father   . Diabetes Father   . Breast cancer Paternal Aunt        <50  . Breast cancer Cousin   . Alzheimer's disease Sister   . Breast cancer Sister 80  DCIS   Social History   Socioeconomic History  . Marital status: Married    Spouse name: Not on file  . Number of children: Not on file  . Years of education: Not on file  . Highest education level: Not on file  Occupational History  . Not on file  Tobacco Use  . Smoking status: Never Smoker  . Smokeless tobacco: Never Used  Vaping Use  . Vaping Use: Never used  Substance and Sexual Activity  . Alcohol use: No  . Drug use: No  . Sexual activity: Yes  Other Topics Concern  . Not on file  Social History Narrative  . Not on file   Social Determinants of Health    Financial Resource Strain: Not on file  Food Insecurity: Not on file  Transportation Needs: Not on file  Physical Activity: Not on file  Stress: Not on file  Social Connections: Not on file  Intimate Partner Violence: Not on file   Current Meds  Medication Sig  . ALPRAZolam (XANAX) 0.25 MG tablet TAKE 1 TABLET BY MOUTH AT BEDTIME AS NEEDED FOR ANXIETY  . amLODipine (NORVASC) 2.5 MG tablet Take 1 tablet by mouth once daily  . aspirin 81 MG tablet Take 81 mg by mouth daily.  Marland Kitchen atorvastatin (LIPITOR) 40 MG tablet Take 1 tablet by mouth once daily  . buPROPion (WELLBUTRIN) 75 MG tablet Take 1 tablet by mouth twice daily  . butalbital-acetaminophen-caffeine (FIORICET) 50-325-40 MG tablet TAKE 1 TABLET BY MOUTH EVERY  12 HOURS AS NEEDED FOR HEADACHE  . Cholecalciferol (VITAMIN D3) 1000 UNITS CAPS Take by mouth.  . cyanocobalamin (,VITAMIN B-12,) 1000 MCG/ML injection INJECT 1 ML (1,000 MCG TOTAL) INTO THE MUSCLE EVERY 30 DAYS.  . DULoxetine (CYMBALTA) 20 MG capsule Take 1 capsule by mouth once daily  . furosemide (LASIX) 20 MG tablet Take 1 tablet (20 mg total) by mouth daily.  Marland Kitchen glucose blood test strip Use to check blood sugar once daily.  . metoprolol succinate (TOPROL-XL) 100 MG 24 hr tablet Take 1 tablet (100 mg total) by mouth at bedtime. Take with or immediately following a meal.  . Omega-3 Fatty Acids (FISH OIL) 1000 MG CAPS Take 1 capsule daily by mouth.  Marland Kitchen omeprazole (PRILOSEC) 20 MG capsule Take 1 capsule (20 mg total) by mouth 2 (two) times daily before a meal.  . OneTouch Delica Lancets 85Y MISC Use to check blood sugar once daily.  Marland Kitchen oxybutynin (DITROPAN-XL) 5 MG 24 hr tablet Take 1 tablet (5 mg total) by mouth at bedtime.  . Syringe/Needle, Disp, (SYRINGE 3CC/25GX1") 25G X 1" 3 ML MISC Use for b12 injections  . telmisartan (MICARDIS) 80 MG tablet Take 1 tablet by mouth once daily  . traZODone (DESYREL) 50 MG tablet Take 1 tablet (50 mg total) by mouth at bedtime.  Marland Kitchen warfarin  (COUMADIN) 6 MG tablet Take 1 tablet (6 mg total) by mouth daily.   Current Facility-Administered Medications for the 02/15/20 encounter (Office Visit) with McLean-Scocuzza, Nino Glow, MD  Medication  . cyanocobalamin ((VITAMIN B-12)) injection 1,000 mcg   No Known Allergies Recent Results (from the past 2160 hour(s))  Protime-INR     Status: Abnormal   Collection Time: 11/24/19  9:39 AM  Result Value Ref Range   INR 2.8 (H) 0.8 - 1.0 ratio   Prothrombin Time 30.5 (H) 9.6 - 13.1 sec  Protime-INR     Status: Abnormal   Collection Time: 01/13/20 10:44 AM  Result Value Ref Range  INR 2.6 (H) 0.8 - 1.0 ratio   Prothrombin Time 28.7 (H) 9.6 - 13.1 sec  Hemoglobin A1c     Status: None   Collection Time: 01/13/20 10:44 AM  Result Value Ref Range   Hgb A1c MFr Bld 6.2 4.6 - 6.5 %    Comment: Glycemic Control Guidelines for People with Diabetes:Non Diabetic:  <6%Goal of Therapy: <7%Additional Action Suggested:  >8%   Comprehensive metabolic panel     Status: Abnormal   Collection Time: 01/13/20 10:44 AM  Result Value Ref Range   Sodium 140 135 - 145 mEq/L   Potassium 4.2 3.5 - 5.1 mEq/L   Chloride 103 96 - 112 mEq/L   CO2 33 (H) 19 - 32 mEq/L   Glucose, Bld 108 (H) 70 - 99 mg/dL   BUN 16 6 - 23 mg/dL   Creatinine, Ser 1.05 0.40 - 1.20 mg/dL   Total Bilirubin 0.7 0.2 - 1.2 mg/dL   Alkaline Phosphatase 93 39 - 117 U/L   AST 17 0 - 37 U/L   ALT 19 0 - 35 U/L   Total Protein 6.2 6.0 - 8.3 g/dL   Albumin 4.3 3.5 - 5.2 g/dL   GFR 53.73 (L) >60.00 mL/min    Comment: Calculated using the CKD-EPI Creatinine Equation (2021)   Calcium 8.9 8.4 - 10.5 mg/dL  Lipid panel     Status: None   Collection Time: 01/13/20 10:44 AM  Result Value Ref Range   Cholesterol 162 0 - 200 mg/dL    Comment: ATP III Classification       Desirable:  < 200 mg/dL               Borderline High:  200 - 239 mg/dL          High:  > = 240 mg/dL   Triglycerides 132.0 0.0 - 149.0 mg/dL    Comment: Normal:  <150  mg/dLBorderline High:  150 - 199 mg/dL   HDL 48.60 >39.00 mg/dL   VLDL 26.4 0.0 - 40.0 mg/dL   LDL Cholesterol 87 0 - 99 mg/dL   Total CHOL/HDL Ratio 3     Comment:                Men          Women1/2 Average Risk     3.4          3.3Average Risk          5.0          4.42X Average Risk          9.6          7.13X Average Risk          15.0          11.0                       NonHDL 112.94     Comment: NOTE:  Non-HDL goal should be 30 mg/dL higher than patient's LDL goal (i.e. LDL goal of < 70 mg/dL, would have non-HDL goal of < 100 mg/dL)   Objective  Body mass index is 32.05 kg/m. Wt Readings from Last 3 Encounters:  02/15/20 169 lb 9.6 oz (76.9 kg)  01/13/20 168 lb (76.2 kg)  11/29/19 169 lb 9.6 oz (76.9 kg)   Temp Readings from Last 3 Encounters:  02/15/20 98.5 F (36.9 C) (Oral)  01/13/20 98.2 F (36.8 C) (Oral)  11/29/19 98.6 F (37  C) (Oral)   BP Readings from Last 3 Encounters:  02/15/20 (!) 156/98  01/13/20 112/62  11/29/19 (!) 146/80   Pulse Readings from Last 3 Encounters:  02/15/20 (!) 59  01/13/20 66  11/29/19 69    Physical Exam Vitals and nursing note reviewed.  Constitutional:      Appearance: Normal appearance. She is well-developed and well-groomed. She is obese.  HENT:     Head: Normocephalic and atraumatic.  Eyes:     Conjunctiva/sclera: Conjunctivae normal.     Pupils: Pupils are equal, round, and reactive to light.  Cardiovascular:     Rate and Rhythm: Normal rate and regular rhythm.     Heart sounds: Normal heart sounds. No murmur heard.   Pulmonary:     Effort: Pulmonary effort is normal.     Breath sounds: Normal breath sounds.  Abdominal:     Tenderness: There is no abdominal tenderness.  Musculoskeletal:     Lumbar back: Positive right straight leg raise test and positive left straight leg raise test.  Skin:    General: Skin is warm and dry.  Neurological:     General: No focal deficit present.     Mental Status: She is alert  and oriented to person, place, and time. Mental status is at baseline.     Gait: Gait normal.  Psychiatric:        Attention and Perception: Attention and perception normal.        Mood and Affect: Mood and affect normal.        Speech: Speech normal.        Behavior: Behavior normal. Behavior is cooperative.        Thought Content: Thought content normal.        Cognition and Memory: Cognition and memory normal.        Judgment: Judgment normal.     Assessment  Plan  Overactive bladder r/o UTI- Plan: oxybutynin (DITROPAN-XL) 5 MG 24 hr tablet Consider titrate up to 10 mg in the future  rec bathroom schedule F/u with PCP  Acute low back pain, DDD L5-S1 noted prior cT ab/pelvis- Plan: DG Lumbar Spine Complete DDD (degenerative disc disease), lumbosacral - Plan: DG Lumbar Spine Complete  Elevated INR - Plan: Protime-INR  Grief  [facility]  fyi for PCP  F/u in 3 months PCP   Provider: Dr. Olivia Mackie McLean-Scocuzza-Internal Medicine

## 2020-02-15 NOTE — Patient Instructions (Addendum)
Oxybutynin extended-release tablets What is this medicine? OXYBUTYNIN (ox i BYOO ti nin) is used to treat overactive bladder. This medicine reduces the amount of bathroom visits. It may also help to control wetting accidents. This medicine may be used for other purposes; ask your health care provider or pharmacist if you have questions. COMMON BRAND NAME(S): Ditropan XL What should I tell my health care provider before I take this medicine? They need to know if you have any of these conditions:  autonomic neuropathy  dementia  difficulty passing urine  glaucoma  intestinal obstruction  kidney disease  liver disease  myasthenia gravis  Parkinson's disease  an unusual or allergic reaction to oxybutynin, other medicines, foods, dyes, or preservatives  pregnant or trying to get pregnant  breast-feeding How should I use this medicine? Take this medicine by mouth with a glass of water. Swallow whole, do not crush, cut, or chew. Follow the directions on the prescription label. You can take this medicine with or without food. Take your doses at regular intervals. Do not take your medicine more often than directed. Talk to your pediatrician regarding the use of this medicine in children. Special care may be needed. While this drug may be prescribed for children as young as 6 years for selected conditions, precautions do apply. Overdosage: If you think you have taken too much of this medicine contact a poison control center or emergency room at once. NOTE: This medicine is only for you. Do not share this medicine with others. What if I miss a dose? If you miss a dose, take it as soon as you can. If it is almost time for your next dose, take only that dose. Do not take double or extra doses. What may interact with this medicine?  antihistamines for allergy, cough and cold  atropine  certain medicines for bladder problems like oxybutynin, tolterodine  certain medicines for  Parkinson's disease like benztropine, trihexyphenidyl  certain medicines for stomach problems like dicyclomine, hyoscyamine  certain medicines for travel sickness like scopolamine  clarithromycin  erythromycin  ipratropium  medicines for fungal infections, like fluconazole, itraconazole, ketoconazole or voriconazole This list may not describe all possible interactions. Give your health care provider a list of all the medicines, herbs, non-prescription drugs, or dietary supplements you use. Also tell them if you smoke, drink alcohol, or use illegal drugs. Some items may interact with your medicine. What should I watch for while using this medicine? It may take a few weeks to notice the full benefit from this medicine. You may need to limit your intake tea, coffee, caffeinated sodas, and alcohol. These drinks may make your symptoms worse. You may get drowsy or dizzy. Do not drive, use machinery, or do anything that needs mental alertness until you know how this medicine affects you. Do not stand or sit up quickly, especially if you are an older patient. This reduces the risk of dizzy or fainting spells. Alcohol may interfere with the effect of this medicine. Avoid alcoholic drinks. Your mouth may get dry. Chewing sugarless gum or sucking hard candy, and drinking plenty of water may help. Contact your doctor if the problem does not go away or is severe. This medicine may cause dry eyes and blurred vision. If you wear contact lenses, you may feel some discomfort. Lubricating drops may help. See your eyecare professional if the problem does not go away or is severe. You may notice the shells of the tablets in your stool from time to time. This  is normal. Avoid extreme heat. This medicine can cause you to sweat less than normal. Your body temperature could increase to dangerous levels, which may lead to heat stroke. What side effects may I notice from receiving this medicine? Side effects that you  should report to your doctor or health care professional as soon as possible:  allergic reactions like skin rash, itching or hives, swelling of the face, lips, or tongue  agitation  breathing problems  confusion  fever  flushing (reddening of the skin)  hallucinations  memory loss  pain or difficulty passing urine  palpitations  unusually weak or tired Side effects that usually do not require medical attention (report to your doctor or health care professional if they continue or are bothersome):  constipation  headache  sexual difficulties (impotence) This list may not describe all possible side effects. Call your doctor for medical advice about side effects. You may report side effects to FDA at 1-800-FDA-1088. Where should I keep my medicine? Keep out of the reach of children. Store at room temperature between 15 and 30 degrees C (59 and 86 degrees F). Protect from moisture and humidity. Throw away any unused medicine after the expiration date. NOTE: This sheet is a summary. It may not cover all possible information. If you have questions about this medicine, talk to your doctor, pharmacist, or health care provider.  2021 Elsevier/Gold Standard (2013-03-10 10:57:06)  Kegel Exercises  Kegel exercises can help strengthen your pelvic floor muscles. The pelvic floor is a group of muscles that support your rectum, small intestine, and bladder. In females, pelvic floor muscles also help support the womb (uterus). These muscles help you control the flow of urine and stool. Kegel exercises are painless and simple, and they do not require any equipment. Your provider may suggest Kegel exercises to:  Improve bladder and bowel control.  Improve sexual response.  Improve weak pelvic floor muscles after surgery to remove the uterus (hysterectomy) or pregnancy (females).  Improve weak pelvic floor muscles after prostate gland removal or surgery (males). Kegel exercises involve  squeezing your pelvic floor muscles, which are the same muscles you squeeze when you try to stop the flow of urine or keep from passing gas. The exercises can be done while sitting, standing, or lying down, but it is best to vary your position. Exercises How to do Kegel exercises: 1. Squeeze your pelvic floor muscles tight. You should feel a tight lift in your rectal area. If you are a female, you should also feel a tightness in your vaginal area. Keep your stomach, buttocks, and legs relaxed. 2. Hold the muscles tight for up to 10 seconds. 3. Breathe normally. 4. Relax your muscles. 5. Repeat as told by your health care provider. Repeat this exercise daily as told by your health care provider. Continue to do this exercise for at least 4-6 weeks, or for as long as told by your health care provider. You may be referred to a physical therapist who can help you learn more about how to do Kegel exercises. Depending on your condition, your health care provider may recommend:  Varying how long you squeeze your muscles.  Doing several sets of exercises every day.  Doing exercises for several weeks.  Making Kegel exercises a part of your regular exercise routine. This information is not intended to replace advice given to you by your health care provider. Make sure you discuss any questions you have with your health care provider. Document Revised: 04/29/2019 Document Reviewed:  08/12/2017 Elsevier Patient Education  2021 [employer].  Urinary Frequency, Adult Urinary frequency means urinating more often than usual. You may urinate every 1-2 hours even though you drink a normal amount of fluid and do not have a bladder infection or condition. Although you urinate more often than normal, the total amount of urine produced in a day is normal. With urinary frequency, you may have an urgent need to urinate often. The stress and anxiety of needing to find a bathroom quickly can make this urge worse.  This condition may go away on its own or you may need treatment at home. Home treatment may include bladder training, exercises, taking medicines, or making changes to your diet. Follow these instructions at home: Bladder health  Keep a bladder diary if told by your health care provider. Keep track of: ? What you eat and drink. ? How often you urinate. ? How much you urinate.  Follow a bladder training program if told by your health care provider. This may include: ? Learning to delay going to the bathroom. ? Double urinating (voiding). This helps if you are not completely emptying your bladder. ? Scheduled voiding.  Do Kegel exercises as told by your health care provider. Kegel exercises strengthen the muscles that help control urination, which may help the condition.   Eating and drinking  If told by your health care provider, make diet changes, such as: ? Avoiding caffeine. ? Drinking fewer fluids, especially alcohol. ? Not drinking in the evening. ? Avoiding foods or drinks that may irritate the bladder. These include coffee, tea, soda, artificial sweeteners, citrus, tomato-based foods, and chocolate. ? Eating foods that help prevent or ease constipation. Constipation can make this condition worse. Your health care provider may recommend that you:  Drink enough fluid to keep your urine pale yellow.  Take over-the-counter or prescription medicines.  Eat foods that are high in fiber, such as beans, whole grains, and fresh fruits and vegetables.  Limit foods that are high in fat and processed sugars, such as fried or sweet foods. General instructions  Take over-the-counter and prescription medicines only as told by your health care provider.  Keep all follow-up visits as told by your health care provider. This is important. Contact a health care provider if:  You start urinating more often.  You feel pain or irritation when you urinate.  You notice blood in your  urine.  Your urine looks cloudy.  You develop a fever.  You begin vomiting. Get help right away if:  You are unable to urinate. Summary  Urinary frequency means urinating more often than usual. With urinary frequency, you may urinate every 1-2 hours even though you drink a normal amount of fluid and do not have a bladder infection or other bladder condition.  Your health care provider may recommend that you keep a bladder diary, follow a bladder training program, or make dietary changes.  If told by your health care provider, do Kegel exercises to strengthen the muscles that help control urination.  Take over-the-counter and prescription medicines only as told by your health care provider.  Contact a health care provider if your symptoms do not improve or get worse. This information is not intended to replace advice given to you by your health care provider. Make sure you discuss any questions you have with your health care provider. Document Revised: 07/02/2017 Document Reviewed: 07/02/2017 Elsevier Patient Education  2021 [employer]. Overactive Bladder, Adult  Overactive bladder is a condition in  which a person has a sudden and frequent need to urinate. A person might also leak urine if he or she cannot get to the bathroom fast enough (urinary incontinence). Sometimes, symptoms can interfere with work or social activities. What are the causes? Overactive bladder is associated with poor nerve signals between your bladder and your brain. Your bladder may get the signal to empty before it is full. You may also have very sensitive muscles that make your bladder squeeze too soon. This condition may also be caused by other factors, such as:  Medical conditions: ? Urinary tract infection. ? Infection of nearby tissues. ? Prostate enlargement. ? Bladder stones, inflammation, or tumors. ? Diabetes. ? Muscle or nerve weakness, especially from these conditions:  A spinal cord  injury.  Stroke.  Multiple sclerosis.  Parkinson's disease.  Other causes: ? Surgery on the uterus or urethra. ? Drinking too much caffeine or alcohol. ? Certain medicines, especially those that eliminate extra fluid in the body (diuretics). ? Constipation. What increases the risk? You may be at greater risk for overactive bladder if you:  Are an older adult.  Smoke.  Are going through menopause.  Have prostate problems.  Have a neurological disease, such as stroke, dementia, Parkinson's disease, or multiple sclerosis (MS).  Eat or drink alcohol, spicy food, caffeine, and other things that irritate the bladder.  Are overweight or obese. What are the signs or symptoms? Symptoms of this condition include a sudden, strong urge to urinate. Other symptoms include:  Leaking urine.  Urinating 8 or more times a day.  Waking up to urinate 2 or more times overnight. How is this diagnosed? This condition may be diagnosed based on:  Your symptoms and medical history.  A physical exam.  Blood or urine tests to check for possible causes, such as infection. You may also need to see a health care provider who specializes in urinary tract problems. This is called a urologist. How is this treated? Treatment for overactive bladder depends on the cause of your condition and whether it is mild or severe. Treatment may include:  Bladder training, such as: ? Learning to control the urge to urinate by following a schedule to urinate at regular intervals. ? Doing Kegel exercises to strengthen the pelvic floor muscles that support your bladder.  Special devices, such as: ? Biofeedback. This uses sensors to help you become aware of your body's signals. ? Electrical stimulation. This uses electrodes placed inside the body (implanted) or outside the body. These electrodes send gentle pulses of electricity to strengthen the nerves or muscles that control the bladder. ? Women may use a  plastic device, called a pessary, that fits into the vagina and supports the bladder.  Medicines, such as: ? Antibiotics to treat bladder infection. ? Antispasmodics to stop the bladder from releasing urine at the wrong time. ? Tricyclic antidepressants to relax bladder muscles. ? Injections of botulinum toxin type A directly into the bladder tissue to relax bladder muscles.  Surgery, such as: ? A device may be implanted to help manage the nerve signals that control urination. ? An electrode may be implanted to stimulate electrical signals in the bladder. ? A procedure may be done to change the shape of the bladder. This is done only in very severe cases. Follow these instructions at home: Eating and drinking  Make diet or lifestyle changes recommended by your health care provider. These may include: ? Drinking fluids throughout the day and not only with meals. ?  Cutting down on caffeine or alcohol. ? Eating a healthy and balanced diet to prevent constipation. This may include:  Choosing foods that are high in fiber, such as beans, whole grains, and fresh fruits and vegetables.  Limiting foods that are high in fat and processed sugars, such as fried and sweet foods.   Lifestyle  Lose weight if needed.  Do not use any products that contain nicotine or tobacco. These include cigarettes, chewing tobacco, and vaping devices, such as e-cigarettes. If you need help quitting, ask your health care provider.   General instructions  Take over-the-counter and prescription medicines only as told by your health care provider.  If you were prescribed an antibiotic medicine, take it as told by your health care provider. Do not stop taking the antibiotic even if you start to feel better.  Use any implants or pessary as told by your health care provider.  If needed, wear pads to absorb urine leakage.  Keep a log to track how much and when you drink, and when you need to urinate. This will help  your health care provider monitor your condition.  Keep all follow-up visits. This is important. Contact a health care provider if:  You have a fever or chills.  Your symptoms do not get better with treatment.  Your pain and discomfort get worse.  You have more frequent urges to urinate. Get help right away if:  You are not able to control your bladder. Summary  Overactive bladder refers to a condition in which a person has a sudden and frequent need to urinate.  Several conditions may lead to an overactive bladder.  Treatment for overactive bladder depends on the cause and severity of your condition.  Making lifestyle changes, doing Kegel exercises, keeping a log, and taking medicines can help with this condition. This information is not intended to replace advice given to you by your health care provider. Make sure you discuss any questions you have with your health care provider. Document Revised: 09/12/2019 Document Reviewed: 09/12/2019 Elsevier Patient Education  2021 [employer].

## 2020-02-16 NOTE — Telephone Encounter (Signed)
Called and spoke to [place]. [place] states that she was scheduled to see Dr. Olivia Mackie on 02/15/2020. She had no other complaints at this time.

## 2020-02-17 LAB — MICROSCOPIC EXAMINATION
Casts: NONE SEEN /lpf
Epithelial Cells (non renal): >10 /hpf — AB (ref 0–10)

## 2020-02-17 LAB — URINALYSIS, ROUTINE W REFLEX MICROSCOPIC
Bilirubin, UA: NEGATIVE
Glucose, UA: NEGATIVE
Leukocytes,UA: NEGATIVE
Nitrite, UA: NEGATIVE
RBC, UA: NEGATIVE
Specific Gravity, UA: 1.029 (ref 1.005–1.030)
Urobilinogen, Ur: 1 mg/dL (ref 0.2–1.0)
pH, UA: 6.5 (ref 5.0–7.5)

## 2020-02-18 LAB — URINE CULTURE

## 2020-02-21 ENCOUNTER — Other Ambulatory Visit: Payer: Self-pay | Admitting: Internal Medicine

## 2020-04-03 ENCOUNTER — Other Ambulatory Visit: Payer: Self-pay

## 2020-04-03 ENCOUNTER — Other Ambulatory Visit (INDEPENDENT_AMBULATORY_CARE_PROVIDER_SITE_OTHER): Payer: PPO

## 2020-04-03 DIAGNOSIS — Z7901 Long term (current) use of anticoagulants: Secondary | ICD-10-CM

## 2020-04-03 NOTE — Addendum Note (Signed)
Addended by: Leeanne Rio on: 04/03/2020 02:28 PM   Modules accepted: Orders

## 2020-04-04 LAB — PROTIME-INR
INR: 2.8 — ABNORMAL HIGH
Prothrombin Time: 26.4 s — ABNORMAL HIGH (ref 9.0–11.5)

## 2020-04-23 ENCOUNTER — Other Ambulatory Visit: Payer: Self-pay | Admitting: Internal Medicine

## 2020-04-23 ENCOUNTER — Other Ambulatory Visit: Payer: Self-pay | Admitting: Family Medicine

## 2020-04-23 DIAGNOSIS — Z86018 Personal history of other benign neoplasm: Secondary | ICD-10-CM | POA: Diagnosis not present

## 2020-04-23 DIAGNOSIS — N3281 Overactive bladder: Secondary | ICD-10-CM

## 2020-04-23 DIAGNOSIS — F409 Phobic anxiety disorder, unspecified: Secondary | ICD-10-CM

## 2020-04-23 DIAGNOSIS — E538 Deficiency of other specified B group vitamins: Secondary | ICD-10-CM

## 2020-04-23 DIAGNOSIS — L578 Other skin changes due to chronic exposure to nonionizing radiation: Secondary | ICD-10-CM | POA: Diagnosis not present

## 2020-04-23 DIAGNOSIS — L821 Other seborrheic keratosis: Secondary | ICD-10-CM | POA: Diagnosis not present

## 2020-04-23 DIAGNOSIS — L570 Actinic keratosis: Secondary | ICD-10-CM | POA: Diagnosis not present

## 2020-04-23 NOTE — Telephone Encounter (Signed)
RX Refill:xanax Last Seen:01-13-20 Last ordered:11-21-19

## 2020-04-30 ENCOUNTER — Telehealth: Payer: Self-pay

## 2020-04-30 ENCOUNTER — Ambulatory Visit (INDEPENDENT_AMBULATORY_CARE_PROVIDER_SITE_OTHER): Payer: PPO

## 2020-04-30 ENCOUNTER — Other Ambulatory Visit: Payer: Self-pay

## 2020-04-30 VITALS — BP 110/66 | HR 63 | Temp 97.1°F | Resp 14 | Ht 61.0 in | Wt 170.6 lb

## 2020-04-30 DIAGNOSIS — N3281 Overactive bladder: Secondary | ICD-10-CM

## 2020-04-30 DIAGNOSIS — Z0000 Encounter for general adult medical examination without abnormal findings: Secondary | ICD-10-CM

## 2020-04-30 DIAGNOSIS — E1169 Type 2 diabetes mellitus with other specified complication: Secondary | ICD-10-CM

## 2020-04-30 DIAGNOSIS — Z7901 Long term (current) use of anticoagulants: Secondary | ICD-10-CM

## 2020-04-30 DIAGNOSIS — Z760 Encounter for issue of repeat prescription: Secondary | ICD-10-CM

## 2020-04-30 MED ORDER — OXYBUTYNIN CHLORIDE ER 5 MG PO TB24: 5.0000 mg | ORAL_TABLET | Freq: Every day | ORAL | 0 refills | Status: DC

## 2020-04-30 MED ORDER — WARFARIN SODIUM 6 MG PO TABS: 6.0000 mg | ORAL_TABLET | Freq: Every day | ORAL | 0 refills | Status: DC

## 2020-04-30 MED ORDER — ATORVASTATIN CALCIUM 40 MG PO TABS: 1.0000 | ORAL_TABLET | Freq: Every day | ORAL | 0 refills | Status: DC

## 2020-04-30 NOTE — Progress Notes (Addendum)
Subjective:   [name] is a 71 y.o. female who presents for Medicare Annual (Subsequent) preventive examination.  Review of Systems    No ROS.   Cardiac Risk Factors include: advanced age (>27men, >56 women);diabetes mellitus;hypertension     Objective:    Today's Vitals   04/30/20 1112  BP: 110/66  Pulse: 63  Resp: 14  Temp: (!) 97.1 F (36.2 C)  SpO2: 97%  Weight: 170 lb 9.6 oz (77.4 kg)  Height: 5\' 1"  (1.549 m)   Body mass index is 32.23 kg/m.  Advanced Directives 04/30/2020 06/20/2019 05/30/2019 04/28/2019 04/27/2018 03/23/2017 03/21/2016  Does Patient Have a Medical Advance Directive? Yes Yes No No Yes Yes Yes  Type of Paramedic of [place];Living will [place];Living will - - Living will Living will;Healthcare Power of [place];Living will  Does patient want to make changes to medical advance directive? No - Patient declined No - Patient declined - Yes (MAU/Ambulatory/Procedural Areas - Information given) No - Patient declined No - Patient declined No - Patient declined  Copy of [place] in Chart? No - copy requested No - copy requested - - - No - copy requested No - copy requested  Would patient like information on creating a medical advance directive? - No - Patient declined No - Patient declined - - - -    Current Medications (verified) Outpatient Encounter Medications as of 04/30/2020  Medication Sig   ALPRAZolam (XANAX) 0.25 MG tablet TAKE 1 TABLET BY MOUTH AT BEDTIME AS NEEDED FOR ANXIETY   amLODipine (NORVASC) 2.5 MG tablet Take 1 tablet by mouth once daily   aspirin 81 MG tablet Take 81 mg by mouth daily.   atorvastatin (LIPITOR) 40 MG tablet Take 1 tablet by mouth once daily   buPROPion (WELLBUTRIN) 75 MG tablet Take 1 tablet by mouth twice daily   butalbital-acetaminophen-caffeine (FIORICET) 50-325-40 MG tablet TAKE 1 TABLET BY MOUTH EVERY  12 HOURS AS NEEDED FOR  HEADACHE   Cholecalciferol (VITAMIN D3) 1000 UNITS CAPS Take by mouth.   cyanocobalamin (,VITAMIN B-12,) 1000 MCG/ML injection INJECT 1ML INTO THE MUSCLE EVERY 30 DAYS   DULoxetine (CYMBALTA) 20 MG capsule Take 1 capsule by mouth once daily   furosemide (LASIX) 20 MG tablet Take 1 tablet by mouth once daily   glucose blood test strip Use to check blood sugar once daily.   metoprolol succinate (TOPROL-XL) 100 MG 24 hr tablet Take 1 tablet (100 mg total) by mouth at bedtime. Take with or immediately following a meal.   nitroGLYCERIN (NITROSTAT) 0.4 MG SL tablet Place 1 tablet (0.4 mg total) under the tongue every 5 (five) minutes as needed for chest pain. (Patient not taking: Reported on 02/15/2020)   Omega-3 Fatty Acids (FISH OIL) 1000 MG CAPS Take 1 capsule daily by mouth.   omeprazole (PRILOSEC) 20 MG capsule Take 1 capsule (20 mg total) by mouth 2 (two) times daily before a meal.   OneTouch Delica Lancets 36R MISC Use to check blood sugar once daily.   oxybutynin (DITROPAN-XL) 5 MG 24 hr tablet TAKE 1 TABLET BY MOUTH AT BEDTIME   Syringe/Needle, Disp, (SYRINGE 3CC/25GX1") 25G X 1" 3 ML MISC Use for b12 injections   telmisartan (MICARDIS) 80 MG tablet Take 1 tablet by mouth once daily   traMADol (ULTRAM) 50 MG tablet Take 1 tablet (50 mg total) by mouth every 8 (eight) hours as needed. (Patient not taking: Reported on 02/15/2020)  traZODone (DESYREL) 50 MG tablet Take 1 tablet (50 mg total) by mouth at bedtime.   warfarin (COUMADIN) 6 MG tablet Take 1 tablet by mouth once daily   Facility-Administered Encounter Medications as of 04/30/2020  Medication   cyanocobalamin ((VITAMIN B-12)) injection 1,000 mcg    Allergies (verified) Patient has no known allergies.   History: Past Medical History:  Diagnosis Date   B12 deficiency    Clotting disorder ([place])    Depression    Diabetes mellitus without complication ([place])    pre diabetic   GERD (gastroesophageal reflux disease)    H/O toxic  shock syndrome 2001   post urologic procedure for stone removal   Headache    migraines   History of pulmonary embolus (PE) Sept 2011   secondary to Protein c& S deficiency, Lupus ACA   HOH (hard of hearing)    wears hearing aids   Hyperlipidemia    Hypertension    Ischemic colitis ([place]) 2012-2013   s/p left colectomy May 2013   Ischemic colitis ([place])    Kidney stones    PONV (postoperative nausea and vomiting)    Past Surgical History:  Procedure Laterality Date   ABDOMINAL HYSTERECTOMY  1996   APPENDECTOMY     bilateral tubal ligation     BREAST EXCISIONAL BIOPSY Left 1989   benign   BREAST SURGERY  1989   left biopsy, normal   CATARACT EXTRACTION W/PHACO Right 05/30/2019   Procedure: CATARACT EXTRACTION PHACO AND INTRAOCULAR LENS PLACEMENT ([place]) RIGHT DIABETIC;  Surgeon: Eulogio Bear, MD;  Location: [facility];  Service: Ophthalmology;  Laterality: Right;  3.84 0:37.1   CATARACT EXTRACTION W/PHACO Left 06/20/2019   Procedure: CATARACT EXTRACTION PHACO AND INTRAOCULAR LENS PLACEMENT ([facility]) LEFT DIABETIC 2.58  00:29.3;  Surgeon: Eulogio Bear, MD;  Location: [facility];  Service: Ophthalmology;  Laterality: Left;  Diabetic   COLECTOMY Left May 2013   [place], ischemic colitis   COLONOSCOPY WITH PROPOFOL N/A 11/16/2014   Procedure: COLONOSCOPY WITH PROPOFOL;  Surgeon: Manya Silvas, MD;  Location[facility] ENDOSCOPY;  Service: Endoscopy;  Laterality: N/A;   ESOPHAGOGASTRODUODENOSCOPY (EGD) WITH PROPOFOL N/A 11/16/2014   Procedure: ESOPHAGOGASTRODUODENOSCOPY (EGD) WITH PROPOFOL;  Surgeon: Manya Silvas, MD;  Location: [facility] ENDOSCOPY;  Service: Endoscopy;  Laterality: N/A;   SPINE SURGERY  1984   lumbar diskectomy, [place]   ureterolithotomy     Dr. Rogers Blocker urology ~ 2012 kidney stones in ICU after surgery   Family History  Problem Relation Age of Onset   Hypertension Mother    Cancer Mother    Diabetes Mother    Heart disease Father    Hypertension  Father    Diabetes Father    Breast cancer Paternal Aunt        <50   Breast cancer Cousin    Alzheimer's disease Sister    Breast cancer Sister 52       DCIS   Social History   Socioeconomic History   Marital status: Married    Spouse name: Not on file   Number of children: Not on file   Years of education: Not on file   Highest education level: Not on file  Occupational History   Not on file  Tobacco Use   Smoking status: Never Smoker   Smokeless tobacco: Never Used  Vaping Use   Vaping Use: Never used  Substance and Sexual Activity   Alcohol use: No   Drug use: No   Sexual  activity: Yes  Other Topics Concern   Not on file  Social History Narrative   Not on file   Social Determinants of Health   Financial Resource Strain: Low Risk    Difficulty of Paying Living Expenses: Not hard at all  Food Insecurity: No Food Insecurity   Worried About Charity fundraiser in the Last Year: Never true   [place] in the Last Year: Never true  Transportation Needs: No Transportation Needs   Lack of Transportation (Medical): No   Lack of Transportation (Non-Medical): No  Physical Activity: Unknown   Days of Exercise per Week: 0 days   Minutes of Exercise per Session: Not on file  Stress: Stress Concern Present   Feeling of Stress : To some extent  Social Connections: Unknown   Frequency of Communication with Friends and Family: More than three times a week   Frequency of Social Gatherings with Friends and Family: More than three times a week   Attends Religious Services: Not on file   Active Member of Clubs or Organizations: Not on file   Attends Archivist Meetings: Not on file   Marital Status: Widowed    Tobacco Counseling Counseling given: Not Answered   Clinical Intake:  Pre-visit preparation completed: Yes       Nutrition Risk Assessment: Has the patient had any N/V/D within the last 2 weeks?  No  Does the patient have any non-healing  wounds?  No  Has the patient had any unintentional weight loss or weight gain?  No   Diabetes Management: Does the patient want to be seen by Chronic Care Management for management of their diabetes?  No  Would the patient like to be referred to a Nutritionist or for Diabetic Management?  No    Diabetes: Yes (Followed by pcp)  How often do you need to have someone help you when you read instructions, pamphlets, or other written materials from your doctor or pharmacy?: 1 - Never    Interpreter Needed?: No      Activities of Daily Living In your present state of health, do you have any difficulty performing the following activities: 04/30/2020 05/30/2019  Hearing? Y N  Comment Hearing aids -  Vision? N N  Difficulty concentrating or making decisions? N N  Walking or climbing stairs? N N  Dressing or bathing? N N  Doing errands, shopping? N -  Preparing Food and eating ? N -  Using the Toilet? N -  In the past six months, have you accidently leaked urine? Y -  Comment Managed by daily liner -  Do you have problems with loss of bowel control? N -  Managing your Medications? N -  Managing your Finances? N -  Housekeeping or managing your Housekeeping? N -  Some recent data might be hidden    Patient Care Team: Crecencio Mc, MD as PCP - General (Internal Medicine) Kate Sable, MD as PCP - Cardiology (Cardiology) Manya Silvas, MD (Inactive) (Gastroenterology)  Indicate any recent Medical Services you may have received from other than [facility] providers in the past year (date may be approximate).     Assessment:   This is a routine wellness examination for [place].  Hearing/Vision screen  Hearing Screening   125Hz  250Hz  500Hz  1000Hz  2000Hz  3000Hz  4000Hz  6000Hz  8000Hz   Right ear:           Left ear:           Comments: Hearing  aid, bilateral   Vision Screening Comments: Followed by [facility]  Wears corrective lenses  Chronic dry eye; drops in use   Diabetic exam  No retinopathy reported  Visual acuity not assessed per patient preference since they have regular follow up with the ophthalmologist  Dietary issues and exercise activities discussed: Current Exercise Habits: Home exercise routine, Intensity: Mild  Regular diet Good water intake  Goals       Patient Stated     Maintain Healthy Lifestyle (pt-stated)      Eat healthy Stay active; walk for exercise        Depression Screen PHQ 2/9 Scores 04/30/2020 04/28/2019 11/16/2018 03/23/2017 03/21/2016 05/19/2014  PHQ - 2 Score 0 0 1 0 0 0    Fall Risk Fall Risk  04/30/2020 02/15/2020 11/29/2019 08/08/2019 04/28/2019  Falls in the past year? 0 0 1 1 0  Number falls in past yr: 0 0 1 0 -  Injury with Fall? 0 0 0 0 -  Comment - - - - -  Risk for fall due to : - - - - -  Risk for fall due to: Comment - - - - -  Follow up Falls evaluation completed Falls evaluation completed Falls evaluation completed Falls evaluation completed Falls evaluation completed    [place]: Handrails in use when climbing stairs? Yes Home free of loose throw rugs in walkways, pet beds, electrical cords, etc? Yes  Adequate lighting in your home to reduce risk of falls? Yes   ASSISTIVE DEVICES UTILIZED TO PREVENT FALLS: Life alert? No  Use of a cane, walker or w/c? No  Grab bars in the bathroom? No  Shower chair or bench in shower? No  Elevated toilet seat or a handicapped toilet? No   TIMED UP AND GO: Was the test performed? Yes .  Length of time to ambulate 10 feet: 10 sec.  Gait steady and fast without use of assistive device  Cognitive Function:  Patient is alert and oriented x3.  Enjoy reading and other brain health activities.  MMSE/6CIT deferred. Normal by direct communication/observation.  MMSE - Mini Mental State Exam 04/28/2019 03/23/2017 03/21/2016  Not completed: Unable to complete - -  Orientation to time - 5 5  Orientation to Place - 5 5   Registration - 3 3  Attention/ Calculation - 5 5  Recall - 3 3  Language- name 2 objects - 2 2  Language- repeat - 1 1  Language- follow 3 step command - 3 3  Language- read & follow direction - 1 1  Write a sentence - 1 1  Copy design - 1 1  Total score - 30 30     6CIT Screen 04/27/2018  What Year? 0 points  What month? 0 points  What time? 0 points  Count back from 20 0 points  Months in reverse 0 points  Repeat phrase 0 points  Total Score 0   Immunizations Immunization History  Administered Date(s) Administered   Fluad Quad(high Dose 65+) 09/17/2018, 10/05/2019   Influenza, High Dose Seasonal PF 09/03/2015, 11/21/2016, 11/13/2017   Influenza,inj,Quad PF,6+ Mos 10/27/2012, 11/02/2013   Influenza-Unspecified 11/07/2014   PFIZER(Purple Top)SARS-COV-2 Vaccination 03/02/2019, 03/23/2019   Pneumococcal Conjugate-13 05/19/2014   Pneumococcal Polysaccharide-23 05/31/2015   Pneumococcal-Unspecified 12/07/2015   Tdap 10/27/2012   Health Maintenance Health Maintenance  Topic Date Due   COVID-19 Vaccine (3 - Booster for Plain series) 02/06/2021 (Originally 09/23/2019)   HEMOGLOBIN A1C  07/12/2020   OPHTHALMOLOGY EXAM  07/20/2020   INFLUENZA VACCINE  08/06/2020   FOOT EXAM  08/07/2020   MAMMOGRAM  01/12/2022   TETANUS/TDAP  10/28/2022   COLONOSCOPY (Pts 45-63yrs Insurance coverage will need to be confirmed)  12/05/2024   DEXA SCAN  Completed   Hepatitis C Screening  Completed   PNA vac Low Risk Adult  Completed   HPV VACCINES  Aged Out   Colorectal cancer screening: Type of screening: Colonoscopy. Completed 12/06/14. Repeat every 10 years  Mammogram status: Completed 01/13/20. Repeat every year  Lung Cancer Screening: (Low Dose CT Chest recommended if Age 73-80 years, 30 pack-year currently smoking OR have quit w/in 15years.) does not qualify.   Vision Screening: Recommended annual ophthalmology exams for early detection of glaucoma and other disorders of the eye. Is  the patient up to date with their annual eye exam?  Yes   Dental Screening: Recommended annual dental exams for proper oral hygiene. Visits every 6 months.   Community Resource Referral / Chronic Care Management: CRR required this visit?  No   CCM required this visit?  No      Plan:   Keep all routine maintenance appointments.   Follow up 05/14/20   Medications reordered atorvastatin, warfarin, oxybutynin.   I have personally reviewed and noted the following in the patient's chart:   Medical and social history Use of alcohol, tobacco or illicit drugs  Current medications and supplements Functional ability and status Nutritional status Physical activity Advanced directives List of other physicians Hospitalizations, surgeries, and ER visits in previous 12 months Vitals Screenings to include cognitive, depression, and falls Referrals and appointments  In addition, I have reviewed and discussed with patient certain preventive protocols, quality metrics, and best practice recommendations. A written personalized care plan for preventive services as well as general preventive health recommendations were provided to patient via mychart.     OBrien-Blaney, Precious Gilchrest L, LPN   04/14/8117      I have reviewed the above information and agree with above.   Deborra Medina, MD

## 2020-04-30 NOTE — Patient Instructions (Addendum)
[name] , Thank you for taking time to come for your Medicare Wellness Visit. I appreciate your ongoing commitment to your health goals. Please review the following plan we discussed and let me know if I can assist you in the future.   These are the goals we discussed: Goals      Patient Stated   .  Maintain Healthy Lifestyle (pt-stated)      Eat healthy Stay active; walk for exercise        This is a list of the screening recommended for you and due dates:  Health Maintenance  Topic Date Due  . COVID-19 Vaccine (3 - Booster for Pfizer series) 02/06/2021*  . Hemoglobin A1C  07/12/2020  . Eye exam for diabetics  07/20/2020  . Flu Shot  08/06/2020  . Complete foot exam   08/07/2020  . Mammogram  01/12/2022  . Tetanus Vaccine  10/28/2022  . Colon Cancer Screening  12/05/2024  . DEXA scan (bone density measurement)  Completed  .  Hepatitis C: One time screening is recommended by Center for Disease Control  (CDC) for  adults born from [birth year] through 1966.   Completed  . Pneumonia vaccines  Completed  . HPV Vaccine  Aged Out  *Topic was postponed. The date shown is not the original due date.    Immunizations Immunization History  Administered Date(s) Administered  . Fluad Quad(high Dose 65+) 09/17/2018, 10/05/2019  . Influenza, High Dose Seasonal PF 09/03/2015, 11/21/2016, 11/13/2017  . Influenza,inj,Quad PF,6+ Mos 10/27/2012, 11/02/2013  . Influenza-Unspecified 11/07/2014  . PFIZER(Purple Top)SARS-COV-2 Vaccination 03/02/2019, 03/23/2019  . Pneumococcal Conjugate-13 05/19/2014  . Pneumococcal Polysaccharide-23 05/31/2015  . Pneumococcal-Unspecified 12/07/2015  . Tdap 10/27/2012    Advanced directives: End of life planning; Advance aging; Advanced directives discussed.  Copy of current HCPOA/Living Will requested.    Conditions/risks identified: none new  Next appointment: Follow up in one year for your annual wellness visit    Preventive Care 65 Years and Older,  Female Preventive care refers to lifestyle choices and visits with your health care provider that can promote health and wellness. What does preventive care include?  A yearly physical exam. This is also called an annual well check.  Dental exams once or twice a year.  Routine eye exams. Ask your health care provider how often you should have your eyes checked.  Personal lifestyle choices, including:  Daily care of your teeth and gums.  Regular physical activity.  Eating a healthy diet.  Avoiding tobacco and drug use.  Limiting alcohol use.  Practicing safe sex.  Taking low-dose aspirin every day.  Taking vitamin and mineral supplements as recommended by your health care provider. What happens during an annual well check? The services and screenings done by your health care provider during your annual well check will depend on your age, overall health, lifestyle risk factors, and family history of disease. Counseling  Your health care provider may ask you questions about your:  Alcohol use.  Tobacco use.  Drug use.  Emotional well-being.  Home and relationship well-being.  Sexual activity.  Eating habits.  History of falls.  Memory and ability to understand (cognition).  Work and work Statistician.  Reproductive health. Screening  You may have the following tests or measurements:  Height, weight, and BMI.  Blood pressure.  Lipid and cholesterol levels. These may be checked every 5 years, or more frequently if you are over 20 years old.  Skin check.  Lung cancer screening. You may have  this screening every year starting at age 11 if you have a 30-pack-year history of smoking and currently smoke or have quit within the past 15 years.  Fecal occult blood test (FOBT) of the stool. You may have this test every year starting at age 66.  Flexible sigmoidoscopy or colonoscopy. You may have a sigmoidoscopy every 5 years or a colonoscopy every 10 years  starting at age 66.  Hepatitis C blood test.  Hepatitis B blood test.  Sexually transmitted disease (STD) testing.  Diabetes screening. This is done by checking your blood sugar (glucose) after you have not eaten for a while (fasting). You may have this done every 1-3 years.  Bone density scan. This is done to screen for osteoporosis. You may have this done starting at age 15.  Mammogram. This may be done every 1-2 years. Talk to your health care provider about how often you should have regular mammograms. Talk with your health care provider about your test results, treatment options, and if necessary, the need for more tests. Vaccines  Your health care provider may recommend certain vaccines, such as:  Influenza vaccine. This is recommended every year.  Tetanus, diphtheria, and acellular pertussis (Tdap, Td) vaccine. You may need a Td booster every 10 years.  Zoster vaccine. You may need this after age 15.  Pneumococcal 13-valent conjugate (PCV13) vaccine. One dose is recommended after age 88.  Pneumococcal polysaccharide (PPSV23) vaccine. One dose is recommended after age 71. Talk to your health care provider about which screenings and vaccines you need and how often you need them. This information is not intended to replace advice given to you by your health care provider. Make sure you discuss any questions you have with your health care provider. Document Released: 01/19/2015 Document Revised: 09/12/2015 Document Reviewed: 10/24/2014 Elsevier Interactive Patient Education  2017 [place] Prevention in the Home Falls can cause injuries. They can happen to people of all ages. There are many things you can do to make your home safe and to help prevent falls. What can I do on the outside of my home?  Regularly fix the edges of walkways and driveways and fix any cracks.  Remove anything that might make you trip as you walk through a door, such as a raised step or  threshold.  Trim any bushes or trees on the path to your home.  Use bright outdoor lighting.  Clear any walking paths of anything that might make someone trip, such as rocks or tools.  Regularly check to see if handrails are loose or broken. Make sure that both sides of any steps have handrails.  Any raised decks and porches should have guardrails on the edges.  Have any leaves, snow, or ice cleared regularly.  Use sand or salt on walking paths during winter.  Clean up any spills in your garage right away. This includes oil or grease spills. What can I do in the bathroom?  Use night lights.  Install grab bars by the toilet and in the tub and shower. Do not use towel bars as grab bars.  Use non-skid mats or decals in the tub or shower.  If you need to sit down in the shower, use a plastic, non-slip stool.  Keep the floor dry. Clean up any water that spills on the floor as soon as it happens.  Remove soap buildup in the tub or shower regularly.  Attach bath mats securely with double-sided non-slip rug tape.  Do not have  throw rugs and other things on the floor that can make you trip. What can I do in the bedroom?  Use night lights.  Make sure that you have a light by your bed that is easy to reach.  Do not use any sheets or blankets that are too big for your bed. They should not hang down onto the floor.  Have a firm chair that has side arms. You can use this for support while you get dressed.  Do not have throw rugs and other things on the floor that can make you trip. What can I do in the kitchen?  Clean up any spills right away.  Avoid walking on wet floors.  Keep items that you use a lot in easy-to-reach places.  If you need to reach something above you, use a strong step stool that has a grab bar.  Keep electrical cords out of the way.  Do not use floor polish or wax that makes floors slippery. If you must use wax, use non-skid floor wax.  Do not have  throw rugs and other things on the floor that can make you trip. What can I do with my stairs?  Do not leave any items on the stairs.  Make sure that there are handrails on both sides of the stairs and use them. Fix handrails that are broken or loose. Make sure that handrails are as long as the stairways.  Check any carpeting to make sure that it is firmly attached to the stairs. Fix any carpet that is loose or worn.  Avoid having throw rugs at the top or bottom of the stairs. If you do have throw rugs, attach them to the floor with carpet tape.  Make sure that you have a light switch at the top of the stairs and the bottom of the stairs. If you do not have them, ask someone to add them for you. What else can I do to help prevent falls?  Wear shoes that:  Do not have high heels.  Have rubber bottoms.  Are comfortable and fit you well.  Are closed at the toe. Do not wear sandals.  If you use a stepladder:  Make sure that it is fully opened. Do not climb a closed stepladder.  Make sure that both sides of the stepladder are locked into place.  Ask someone to hold it for you, if possible.  Clearly mark and make sure that you can see:  Any grab bars or handrails.  First and last steps.  Where the edge of each step is.  Use tools that help you move around (mobility aids) if they are needed. These include:  Canes.  Walkers.  Scooters.  Crutches.  Turn on the lights when you go into a dark area. Replace any light bulbs as soon as they burn out.  Set up your furniture so you have a clear path. Avoid moving your furniture around.  If any of your floors are uneven, fix them.  If there are any pets around you, be aware of where they are.  Review your medicines with your doctor. Some medicines can make you feel dizzy. This can increase your chance of falling. Ask your doctor what other things that you can do to help prevent falls. This information is not intended to  replace advice given to you by your health care provider. Make sure you discuss any questions you have with your health care provider. Document Released: 10/19/2008 Document Revised: 05/31/2015 Document Reviewed:  01/27/2014 Elsevier Interactive Patient Education  2017 [employer].

## 2020-04-30 NOTE — Telephone Encounter (Signed)
Medication refill PT/INR scheduled Next office visit with pcp scheduled 05/10/20

## 2020-05-14 ENCOUNTER — Other Ambulatory Visit: Payer: Self-pay

## 2020-05-14 ENCOUNTER — Ambulatory Visit (INDEPENDENT_AMBULATORY_CARE_PROVIDER_SITE_OTHER): Payer: PPO | Admitting: Internal Medicine

## 2020-05-14 ENCOUNTER — Encounter: Payer: Self-pay | Admitting: Internal Medicine

## 2020-05-14 VITALS — BP 138/76 | HR 82 | Temp 98.1°F | Resp 15 | Ht 61.0 in | Wt 170.8 lb

## 2020-05-14 DIAGNOSIS — E785 Hyperlipidemia, unspecified: Secondary | ICD-10-CM

## 2020-05-14 DIAGNOSIS — E1169 Type 2 diabetes mellitus with other specified complication: Secondary | ICD-10-CM | POA: Diagnosis not present

## 2020-05-14 DIAGNOSIS — R809 Proteinuria, unspecified: Secondary | ICD-10-CM | POA: Diagnosis not present

## 2020-05-14 DIAGNOSIS — I10 Essential (primary) hypertension: Secondary | ICD-10-CM | POA: Diagnosis not present

## 2020-05-14 DIAGNOSIS — E669 Obesity, unspecified: Secondary | ICD-10-CM

## 2020-05-14 DIAGNOSIS — E1159 Type 2 diabetes mellitus with other circulatory complications: Secondary | ICD-10-CM

## 2020-05-14 DIAGNOSIS — Z7901 Long term (current) use of anticoagulants: Secondary | ICD-10-CM | POA: Diagnosis not present

## 2020-05-14 DIAGNOSIS — E1129 Type 2 diabetes mellitus with other diabetic kidney complication: Secondary | ICD-10-CM | POA: Diagnosis not present

## 2020-05-14 DIAGNOSIS — F4321 Adjustment disorder with depressed mood: Secondary | ICD-10-CM

## 2020-05-14 DIAGNOSIS — I152 Hypertension secondary to endocrine disorders: Secondary | ICD-10-CM

## 2020-05-14 DIAGNOSIS — K551 Chronic vascular disorders of intestine: Secondary | ICD-10-CM

## 2020-05-14 DIAGNOSIS — T63421S Toxic effect of venom of ants, accidental (unintentional), sequela: Secondary | ICD-10-CM | POA: Diagnosis not present

## 2020-05-14 DIAGNOSIS — Z634 Disappearance and death of family member: Secondary | ICD-10-CM | POA: Diagnosis not present

## 2020-05-14 MED ORDER — TRIAMCINOLONE ACETONIDE 0.1 % EX CREA: 1.0000 "application " | TOPICAL_CREAM | Freq: Two times a day (BID) | CUTANEOUS | 0 refills | Status: DC

## 2020-05-14 MED ORDER — DOXYCYCLINE HYCLATE 100 MG PO TABS: 100.0000 mg | ORAL_TABLET | Freq: Two times a day (BID) | ORAL | 0 refills | Status: DC

## 2020-05-14 MED ORDER — BUPROPION HCL ER (XL) 300 MG PO TB24: 300.0000 mg | ORAL_TABLET | Freq: Every day | ORAL | 1 refills | Status: DC

## 2020-05-14 NOTE — Progress Notes (Signed)
Subjective:  Patient ID: [name], female    DOB: July 17, 1950  Age: 70 y.o. MRN: [id number]  CC: The primary encounter diagnosis was Chronic ischemic colitis, enteritis, or enterocolitis ([place]). Diagnoses of Hyperlipidemia associated with type 2 diabetes mellitus ([place]), Obesity, diabetes, and hypertension syndrome ([place]), Long term current use of anticoagulant therapy, Fire ant bite, accidental or unintentional, sequela, Grief, Controlled type 2 diabetes mellitus with microalbuminuria, without long-term current use of insulin ([place]), Widowed, and Primary hypertension were also pertinent to this visit.  HPI [name] presents for follow up on type 2 DM with obesity and hypertension, and grief following the loss of her beloved husband of 52 +  years.   This visit occurred during the SARS-CoV-2 public health emergency.  Safety protocols were in place, including screening questions prior to the visit, additional usage of staff PPE, and extensive cleaning of exam room while observing appropriate contact time as indicated for disinfecting solutions.   Complicated grief :  She is now 6 months out from the loss of her husband, continues to grieve deeply on a daily basis;  Tearful today , some daily fatigue.  Not sleeping well.  She is participating in grief share, daughter Kenney Houseman goes with her. The instructor is too soft spoken and patient often forgets to wear her hearing aides but she still feels a benefit from being with other widows and widowers,  /she a.  Has started a bible study group 6 women.  Working on the house.   She sustained multiple Fire ant bites on left foot one week ago while standing in yard barefoot.  Still red and itchy  Right wrist with a nonhealing scab.  Leaving it open to air,   Too dry.      Outpatient Medications Prior to Visit  Medication Sig Dispense Refill  . ALPRAZolam (XANAX) 0.25 MG tablet TAKE 1 TABLET BY MOUTH AT BEDTIME AS NEEDED FOR ANXIETY 30 tablet 1  .  amLODipine (NORVASC) 2.5 MG tablet Take 1 tablet by mouth once daily 90 tablet 0  . aspirin 81 MG tablet Take 81 mg by mouth daily.    Marland Kitchen atorvastatin (LIPITOR) 40 MG tablet Take 1 tablet (40 mg total) by mouth daily. 90 tablet 0  . butalbital-acetaminophen-caffeine (FIORICET) 50-325-40 MG tablet TAKE 1 TABLET BY MOUTH EVERY  12 HOURS AS NEEDED FOR HEADACHE 60 tablet 5  . Cholecalciferol (VITAMIN D3) 1000 UNITS CAPS Take by mouth.    . cyanocobalamin (,VITAMIN B-12,) 1000 MCG/ML injection INJECT 1ML INTO THE MUSCLE EVERY 30 DAYS 10 mL 0  . DULoxetine (CYMBALTA) 20 MG capsule Take 1 capsule by mouth once daily 90 capsule 0  . furosemide (LASIX) 20 MG tablet Take 1 tablet by mouth once daily 90 tablet 0  . glucose blood test strip Use to check blood sugar once daily. 100 each 5  . metoprolol succinate (TOPROL-XL) 100 MG 24 hr tablet Take 1 tablet (100 mg total) by mouth at bedtime. Take with or immediately following a meal. 90 tablet 1  . nitroGLYCERIN (NITROSTAT) 0.4 MG SL tablet Place 1 tablet (0.4 mg total) under the tongue every 5 (five) minutes as needed for chest pain. 50 tablet 3  . Omega-3 Fatty Acids (FISH OIL) 1000 MG CAPS Take 1 capsule daily by mouth.    Marland Kitchen omeprazole (PRILOSEC) 20 MG capsule Take 1 capsule (20 mg total) by mouth 2 (two) times daily before a meal. 60 capsule 2  . OneTouch Delica Lancets 17E  MISC Use to check blood sugar once daily. 100 each 5  . oxybutynin (DITROPAN-XL) 5 MG 24 hr tablet Take 1 tablet (5 mg total) by mouth at bedtime. 90 tablet 0  . Syringe/Needle, Disp, (SYRINGE 3CC/25GX1") 25G X 1" 3 ML MISC Use for b12 injections 50 each 0  . telmisartan (MICARDIS) 80 MG tablet Take 1 tablet by mouth once daily 90 tablet 0  . traMADol (ULTRAM) 50 MG tablet Take 1 tablet (50 mg total) by mouth every 8 (eight) hours as needed. 90 tablet 4  . traZODone (DESYREL) 50 MG tablet Take 1 tablet (50 mg total) by mouth at bedtime. 90 tablet 1  . warfarin (COUMADIN) 6 MG tablet  Take 1 tablet (6 mg total) by mouth daily. 90 tablet 0  . buPROPion (WELLBUTRIN) 75 MG tablet Take 1 tablet by mouth twice daily 60 tablet 0   Facility-Administered Medications Prior to Visit  Medication Dose Route Frequency Provider Last Rate Last Admin  . cyanocobalamin ((VITAMIN B-12)) injection 1,000 mcg  1,000 mcg Intramuscular Once Crecencio Mc, MD        Review of Systems;  Patient denies headache, fevers, malaise, unintentional weight loss, skin rash, eye pain, sinus congestion and sinus pain, sore throat, dysphagia,  hemoptysis , cough, dyspnea, wheezing, chest pain, palpitations, orthopnea, edema, abdominal pain, nausea, melena, diarrhea, constipation, flank pain, dysuria, hematuria, urinary  Frequency, nocturia, numbness, tingling, seizures,  Focal weakness, Loss of consciousness,  Tremor, insomnia, depression, anxiety, and suicidal ideation.      Objective:  BP 138/76 (BP Location: Left Arm, Patient Position: Sitting, Cuff Size: Large)   Pulse 82   Temp 98.1 F (36.7 C) (Oral)   Resp 15   Ht 5\' 1"  (1.549 m)   Wt 170 lb 12.8 oz (77.5 kg)   SpO2 98%   BMI 32.27 kg/m   BP Readings from Last 3 Encounters:  05/14/20 138/76  04/30/20 110/66  02/15/20 (!) 156/98    Wt Readings from Last 3 Encounters:  05/14/20 170 lb 12.8 oz (77.5 kg)  04/30/20 170 lb 9.6 oz (77.4 kg)  02/15/20 169 lb 9.6 oz (76.9 kg)    General appearance: alert, cooperative and appears stated age Ears: normal TM's and external ear canals both ears Throat: lips, mucosa, and tongue normal; teeth and gums normal Neck: no adenopathy, no carotid bruit, supple, symmetrical, trachea midline and thyroid not enlarged, symmetric, no tenderness/mass/nodules Back: symmetric, no curvature. ROM normal. No CVA tenderness. Lungs: clear to auscultation bilaterally Heart: regular rate and rhythm, S1, S2 normal, no murmur, click, rub or gallop Abdomen: soft, non-tender; bowel sounds normal; no masses,  no  organomegaly Pulses: 2+ and symmetric Skin: Skin color, texture, turgor normal. No rashes or lesions Lymph nodes: Cervical, supraclavicular, and axillary nodes normal.  Lab Results  Component Value Date   HGBA1C 6.3 (H) 05/14/2020   HGBA1C 6.2 01/13/2020   HGBA1C 6.3 (H) 08/08/2019    Lab Results  Component Value Date   CREATININE 0.97 05/14/2020   CREATININE 1.05 01/13/2020   CREATININE 1.00 01/20/2019    Lab Results  Component Value Date   WBC 5.5 08/08/2019   HGB 14.1 08/08/2019   HCT 43.6 08/08/2019   PLT 178 08/08/2019   GLUCOSE 103 (H) 05/14/2020   CHOL 162 01/13/2020   TRIG 132.0 01/13/2020   HDL 48.60 01/13/2020   LDLDIRECT 111.0 08/13/2016   LDLCALC 87 01/13/2020   ALT 19 05/14/2020   AST 16 05/14/2020   NA 142 05/14/2020  K 3.7 05/14/2020   CL 105 05/14/2020   CREATININE 0.97 05/14/2020   BUN 14 05/14/2020   CO2 23 05/14/2020   TSH 2.62 11/18/2016   INR 3.0 (H) 05/14/2020   HGBA1C 6.3 (H) 05/14/2020   MICROALBUR 2.2 (H) 11/13/2017    MM 3D SCREEN BREAST BILATERAL  Result Date: 01/13/2020 CLINICAL DATA:  Screening. EXAM: DIGITAL SCREENING BILATERAL MAMMOGRAM WITH TOMO AND CAD COMPARISON:  Previous exam(s). ACR Breast Density Category b: There are scattered areas of fibroglandular density. FINDINGS: There are no findings suspicious for malignancy. Images were processed with CAD. IMPRESSION: No mammographic evidence of malignancy. A result letter of this screening mammogram will be mailed directly to the patient. RECOMMENDATION: Screening mammogram in one year. (Code:SM-B-01Y) BI-RADS CATEGORY  1: Negative. Electronically Signed   By: Audie Pinto M.D.   On: 01/13/2020 12:56    Assessment & Plan:   Problem List Items Addressed This Visit      Unprioritized   Chronic ischemic colitis, enteritis, or enterocolitis ([place]) - Primary   Relevant Medications   warfarin (COUMADIN) 5 MG tablet   Controlled type 2 diabetes mellitus with microalbuminuria,  without long-term current use of insulin ([place])     her diabetes remains   diet controlled.     She is taking  a baby  Aspirin,  atorvastatin, and telmisartan  daily   Lab Results  Component Value Date   HGBA1C 6.3 (H) 05/14/2020   Lab Results  Component Value Date   MICROALBUR 2.2 (H) 11/13/2017           Fire ant bite    Left foot .  One week ago  Adding steroid cream to relieve inflammation and itching. No cellulitis on exam      Relevant Medications   triamcinolone cream (KENALOG) 0.1 %   Grief    She is still struggling to move past the grief of her devastating loss.  Given her fatigue, she is receptive to increasing the wellbutrin dose to 300 mg daily        Hyperlipidemia associated with type 2 diabetes mellitus ([facility])   Hypertension    BP is at goal. Continue telmisartan 80 mg and metoprolol XL 25 mg . Renal function and lytes are normal.   Lab Results  Component Value Date   CREATININE 0.97 05/14/2020   Lab Results  Component Value Date   NA 142 05/14/2020   K 3.7 05/14/2020   CL 105 05/14/2020   CO2 23 05/14/2020         Relevant Medications   warfarin (COUMADIN) 5 MG tablet   Long term current use of anticoagulant therapy    INR is at the upper limit of normal on current warfarin regimen. Of 6 mg daily   Will reduce weekly dose by 3 mg  By alternating 5 mg with 6 mg daily   Lab Results  Component Value Date   INR 3.0 (H) 05/14/2020   INR 2.8 (H) 04/03/2020   INR 2.8 (H) 02/15/2020   PROTIME 57.9 (A) 02/17/2014        Relevant Orders   Protime-INR (Completed)   Widowed    Her husband passed 6 months ago.  grief counselling and support groups in place.        Other Visit Diagnoses    Obesity, diabetes, and hypertension syndrome ([place])       Relevant Medications   warfarin (COUMADIN) 5 MG tablet   Other Relevant Orders   Hemoglobin A1c (  Completed)   Comprehensive metabolic panel (Completed)   Microalbumin / creatinine urine ratio     .   A total of 40 minutes was spent with patient more than half of which was spent in counseling patient on her grief,  Depression and diabetes management , reviewing and explaining recent labs and imaging studies done, and coordination of care. I have discontinued Asaiah F. Bastin's buPROPion. I have also changed her warfarin. Additionally, I am having her start on buPROPion, triamcinolone cream, and doxycycline. Lastly, I am having her maintain her Vitamin D3, aspirin, traMADol, Fish Oil, SYRINGE 3CC/25GX1", glucose blood, OneTouch Delica Lancets 16W, nitroGLYCERIN, butalbital-acetaminophen-caffeine, omeprazole, metoprolol succinate, traZODone, telmisartan, DULoxetine, furosemide, amLODipine, ALPRAZolam, cyanocobalamin, atorvastatin, warfarin, and oxybutynin. We will continue to administer cyanocobalamin.  Meds ordered this encounter  Medications  . buPROPion (WELLBUTRIN XL) 300 MG 24 hr tablet    Sig: Take 1 tablet (300 mg total) by mouth daily.    Dispense:  30 tablet    Refill:  1  . triamcinolone cream (KENALOG) 0.1 %    Sig: Apply 1 application topically 2 (two) times daily.    Dispense:  30 g    Refill:  0  . doxycycline (VIBRA-TABS) 100 MG tablet    Sig: Take 1 tablet (100 mg total) by mouth 2 (two) times daily.    Dispense:  20 tablet    Refill:  0  . DISCONTD: warfarin (COUMADIN) 5 MG tablet    Sig: Take 1 tablet (5 mg total) by mouth daily.    Dispense:  30 tablet    Refill:  3  . warfarin (COUMADIN) 5 MG tablet    Sig: Take 1 tablet (5 mg total) by mouth daily. Alternating with 6 mg dose.    Dispense:  30 tablet    Refill:  3    Medications Discontinued During This Encounter  Medication Reason  . buPROPion (WELLBUTRIN) 75 MG tablet   . warfarin (COUMADIN) 5 MG tablet Reorder    Follow-up: Return in about 6 months (around 11/14/2020).   Crecencio Mc, MD

## 2020-05-14 NOTE — Patient Instructions (Addendum)
I Have sent triamcinolone steroid cream to use twice daily  on ant bites  I have increased the wellbutrin to once daily 300 mg dose for your depression   [facility] Spotted Fever  can present with fever and a headache, NOT a rash;  the occurrence of these symptoms during spring/summer/fall in the context of recent tick exposure is RMSF until proven otherwise and should be treated immediately with doxycycline.  I have sent this rx to your pharmacy to keep on hand and use if you develop these symptoms.  Let me know if this occurs so we can set you up for the appropriate testing and adjust your coumadin dose  .

## 2020-05-15 DIAGNOSIS — T63421A Toxic effect of venom of ants, accidental (unintentional), initial encounter: Secondary | ICD-10-CM

## 2020-05-15 HISTORY — DX: Toxic effect of venom of ants, accidental (unintentional), initial encounter: T63.421A

## 2020-05-15 LAB — PROTIME-INR
INR: 3 — ABNORMAL HIGH (ref 0.9–1.2)
Prothrombin Time: 30.2 s — ABNORMAL HIGH (ref 9.1–12.0)

## 2020-05-15 LAB — COMPREHENSIVE METABOLIC PANEL
ALT: 19 IU/L (ref 0–32)
AST: 16 IU/L (ref 0–40)
Albumin/Globulin Ratio: 2.5 — ABNORMAL HIGH (ref 1.2–2.2)
Albumin: 4.2 g/dL (ref 3.7–4.7)
Alkaline Phosphatase: 109 IU/L (ref 44–121)
BUN/Creatinine Ratio: 14 (ref 12–28)
BUN: 14 mg/dL (ref 8–27)
Bilirubin Total: 0.3 mg/dL (ref 0.0–1.2)
CO2: 23 mmol/L (ref 20–29)
Calcium: 9.1 mg/dL (ref 8.7–10.3)
Chloride: 105 mmol/L (ref 96–106)
Creatinine, Ser: 0.97 mg/dL (ref 0.57–1.00)
Globulin, Total: 1.7 g/dL (ref 1.5–4.5)
Glucose: 103 mg/dL — ABNORMAL HIGH (ref 65–99)
Potassium: 3.7 mmol/L (ref 3.5–5.2)
Sodium: 142 mmol/L (ref 134–144)
Total Protein: 5.9 g/dL — ABNORMAL LOW (ref 6.0–8.5)
eGFR: 62 mL/min/{1.73_m2} (ref >59)

## 2020-05-15 LAB — MICROALBUMIN / CREATININE URINE RATIO
Creatinine, Urine: 225.2 mg/dL
Microalb/Creat Ratio: 12 mg/g creat (ref 0–29)
Microalbumin, Urine: 26.1 ug/mL

## 2020-05-15 LAB — HEMOGLOBIN A1C
Est. average glucose Bld gHb Est-mCnc: 134 mg/dL
Hgb A1c MFr Bld: 6.3 % — ABNORMAL HIGH (ref 4.8–5.6)

## 2020-05-15 MED ORDER — WARFARIN SODIUM 5 MG PO TABS: 5.0000 mg | ORAL_TABLET | Freq: Every day | ORAL | 3 refills | Status: DC

## 2020-05-15 NOTE — Assessment & Plan Note (Addendum)
BP is at goal. Continue telmisartan 80 mg and metoprolol XL 25 mg . Renal function and lytes are normal.   Lab Results  Component Value Date   CREATININE 0.97 05/14/2020   Lab Results  Component Value Date   NA 142 05/14/2020   K 3.7 05/14/2020   CL 105 05/14/2020   CO2 23 05/14/2020

## 2020-05-15 NOTE — Assessment & Plan Note (Signed)
She is still struggling to move past the grief of her devastating loss.  Given her fatigue, she is receptive to increasing the wellbutrin dose to 300 mg daily

## 2020-05-15 NOTE — Assessment & Plan Note (Addendum)
INR is at the upper limit of normal on current warfarin regimen. Of 6 mg daily   Will reduce weekly dose by 3 mg  By alternating 5 mg with 6 mg daily   Lab Results  Component Value Date   INR 3.0 (H) 05/14/2020   INR 2.8 (H) 04/03/2020   INR 2.8 (H) 02/15/2020   PROTIME 57.9 (A) 02/17/2014

## 2020-05-15 NOTE — Assessment & Plan Note (Signed)
Left foot .  One week ago  Adding steroid cream to relieve inflammation and itching. No cellulitis on exam

## 2020-05-15 NOTE — Assessment & Plan Note (Signed)
her diabetes remains   diet controlled.     She is taking  a baby  Aspirin,  atorvastatin, and telmisartan  daily   Lab Results  Component Value Date   HGBA1C 6.3 (H) 05/14/2020   Lab Results  Component Value Date   MICROALBUR 2.2 (H) 11/13/2017

## 2020-05-15 NOTE — Assessment & Plan Note (Signed)
Her husband passed 6 months ago.  grief counselling and support groups in place.

## 2020-05-23 ENCOUNTER — Other Ambulatory Visit: Payer: Self-pay | Admitting: Internal Medicine

## 2020-05-23 NOTE — Telephone Encounter (Signed)
RX Refill:fioricet Last Seen:05-14-20 Last ordered:12-14-18

## 2020-05-24 ENCOUNTER — Ambulatory Visit
Admission: EM | Admit: 2020-05-24 | Discharge: 2020-05-24 | Disposition: A | Discharge status: Home / Self Care | Payer: PPO | Attending: Emergency Medicine | Admitting: Emergency Medicine

## 2020-05-24 ENCOUNTER — Emergency Department
Admission: EM | Admit: 2020-05-24 | Discharge: 2020-05-24 | Disposition: A | Discharge status: Home / Self Care | Payer: PPO | Attending: Emergency Medicine | Admitting: Emergency Medicine

## 2020-05-24 ENCOUNTER — Encounter: Payer: Self-pay | Admitting: Emergency Medicine

## 2020-05-24 ENCOUNTER — Other Ambulatory Visit: Payer: Self-pay

## 2020-05-24 DIAGNOSIS — G43909 Migraine, unspecified, not intractable, without status migrainosus: Secondary | ICD-10-CM | POA: Insufficient documentation

## 2020-05-24 DIAGNOSIS — B349 Viral infection, unspecified: Secondary | ICD-10-CM | POA: Diagnosis not present

## 2020-05-24 DIAGNOSIS — Z1152 Encounter for screening for COVID-19: Secondary | ICD-10-CM | POA: Diagnosis not present

## 2020-05-24 DIAGNOSIS — Z5321 Procedure and treatment not carried out due to patient leaving prior to being seen by health care provider: Secondary | ICD-10-CM | POA: Insufficient documentation

## 2020-05-24 LAB — POCT RAPID STREP A (OFFICE): Rapid Strep A Screen: NEGATIVE

## 2020-05-24 NOTE — ED Provider Notes (Signed)
[name]    CSN: [id number] Arrival date & time: 05/24/20  1434      History   Chief Complaint Chief Complaint  Patient presents with  . Headache  . Cough  . Rash    HPI [name] is a 70 y.o. female.   Patient presents with fever, headache, sore throat, nonproductive cough x2 days.  T-max 99.4.  She also reports a rash on her right arm and left foot which is pruritic x 5 days; she states the rash on her foot is caused by ant bites.  Treatment attempted at home with Tylenol and Fioricet.  She denies shortness of breath, vomiting, diarrhea, or other symptoms.  Her medical history includes hypertension, diabetes, migraine headaches, GERD, ischemic colitis, kidney stones, pulmonary embolism, clotting disorder, depression.  The history is provided by the patient and medical records.    Past Medical History:  Diagnosis Date  . B12 deficiency   . Clotting disorder ([place])   . Depression   . Diabetes mellitus without complication ([place])    pre diabetic  . GERD (gastroesophageal reflux disease)   . H/O toxic shock syndrome 2001   post urologic procedure for stone removal  . Headache    migraines  . 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 HOH (hard of hearing)    wears hearing aids  . Hyperlipidemia   . Hypertension   . Ischemic colitis ([place]) 2012-2013   s/p left colectomy May 2013  . Ischemic colitis ([place])   . Kidney stones   . PONV (postoperative nausea and vomiting)     Patient Active Problem List   Diagnosis Date Noted  . Fire ant bite 05/15/2020  . DDD (degenerative disc disease), lumbosacral 02/15/2020  . Tear of medial meniscus of knee 01/13/2020  . Grief 11/30/2019  . Breast cancer screening by mammogram 11/30/2019  . Aortic arch atherosclerosis ([place]) 11/30/2019  . Night sweats 08/09/2019  . Microalbuminuria due to type 2 diabetes mellitus ([place]) 05/02/2018  . Cervicalgia of  occipito-atlanto-axial region 03/25/2017  . B12 deficiency 03/25/2017  . Widowed 11/22/2016  . Osteoporosis 05/15/2016  . Baker's cyst of knee, left 12/11/2015  . Hyperlipidemia associated with type 2 diabetes mellitus ([place]) 09/03/2015  . Controlled type 2 diabetes mellitus with microalbuminuria, without long-term current use of insulin ([place]) 12/08/2014  . S/P insertion of IVC (inferior vena caval) filter 11/05/2013  . Encounter for preventive health examination 11/05/2013  . History of meniscal tear 11/05/2013  . History of shingles 11/05/2013  . Vitamin D deficiency 11/05/2013  . S/P hysterectomy 11/02/2013  . Morbid obesity ([place]) 10/28/2012  . Protein C deficiency ([place]) 07/20/2011  . Long term current use of anticoagulant therapy 07/20/2011  . History of anticoagulant therapy 07/20/2011  . Chronic ischemic colitis, enteritis, or enterocolitis ([place]) 04/08/2011  . Hypertension   . H/O toxic shock syndrome   . History of pulmonary embolus (PE)     Past Surgical History:  Procedure Laterality Date  . ABDOMINAL HYSTERECTOMY  1996  . APPENDECTOMY    . bilateral tubal ligation    . BREAST EXCISIONAL BIOPSY Left 1989   benign  . BREAST SURGERY  1989   left biopsy, normal  . CATARACT EXTRACTION W/PHACO Right 05/30/2019   Procedure: CATARACT EXTRACTION PHACO AND INTRAOCULAR LENS PLACEMENT (IOC) RIGHT DIABETIC;  Surgeon: Eulogio Bear, MD;  Location: [place];  Service: Ophthalmology;  Laterality: Right;  3.84 0:37.1  . CATARACT EXTRACTION W/PHACO Left 06/20/2019   Procedure: CATARACT EXTRACTION PHACO AND INTRAOCULAR LENS PLACEMENT (IOC) LEFT DIABETIC 2.58  00:29.3;  Surgeon: Eulogio Bear, MD;  Location: [place];  Service: Ophthalmology;  Laterality: Left;  Diabetic  . COLECTOMY Left May 2013   [place], ischemic colitis  . COLONOSCOPY WITH PROPOFOL N/A 11/16/2014   Procedure: COLONOSCOPY WITH PROPOFOL;  Surgeon: Manya Silvas, MD;  Location: [facility]  ENDOSCOPY;  Service: Endoscopy;  Laterality: N/A;  . ESOPHAGOGASTRODUODENOSCOPY (EGD) WITH PROPOFOL N/A 11/16/2014   Procedure: ESOPHAGOGASTRODUODENOSCOPY (EGD) WITH PROPOFOL;  Surgeon: Manya Silvas, MD;  Location: [facility] ENDOSCOPY;  Service: Endoscopy;  Laterality: N/A;  . SPINE SURGERY  1984   lumbar diskectomy, [place]  . ureterolithotomy     Dr. Rogers Blocker urology ~ 2012 kidney stones in ICU after surgery    OB History   No obstetric history on file.      Home Medications    Prior to Admission medications   Medication Sig Start Date End Date Taking? Authorizing Provider  amLODipine (NORVASC) 2.5 MG tablet Take 1 tablet by mouth once daily 04/23/20  Yes Crecencio Mc, MD  aspirin 81 MG tablet Take 81 mg by mouth daily.   Yes [provider]  atorvastatin (LIPITOR) 40 MG tablet Take 1 tablet (40 mg total) by mouth daily. 04/30/20  Yes Crecencio Mc, MD  buPROPion (WELLBUTRIN XL) 300 MG 24 hr tablet Take 1 tablet (300 mg total) by mouth daily. 05/14/20  Yes Crecencio Mc, MD  butalbital-acetaminophen-caffeine (FIORICET) 50-325-40 MG tablet TAKE 1 TABLET BY MOUTH EVERY 12 HOURS AS NEEDED FOR HEADACHE 05/23/20  Yes Crecencio Mc, MD  Cholecalciferol (VITAMIN D3) 1000 UNITS CAPS Take by mouth.   Yes [provider]  cyanocobalamin (,VITAMIN B-12,) 1000 MCG/ML injection INJECT 1ML INTO THE MUSCLE EVERY 30 DAYS 04/23/20  Yes Crecencio Mc, MD  furosemide (LASIX) 20 MG tablet Take 1 tablet by mouth once daily 04/23/20  Yes Crecencio Mc, MD  metoprolol succinate (TOPROL-XL) 100 MG 24 hr tablet TAKE 1 TABLET BY MOUTH AT BEDTIME (TAKE  WITH  OR  IMMEDIATELY  FOLLOWING  A  MEAL) 05/23/20  Yes Crecencio Mc, MD  Omega-3 Fatty Acids (FISH OIL) 1000 MG CAPS Take 1 capsule daily by mouth.   Yes [provider]  omeprazole (PRILOSEC) 20 MG capsule Take 1 capsule (20 mg total) by mouth 2 (two) times daily before a meal. 08/09/19  Yes Crecencio Mc, MD  oxybutynin  (DITROPAN-XL) 5 MG 24 hr tablet Take 1 tablet (5 mg total) by mouth at bedtime. 04/30/20  Yes Crecencio Mc, MD  telmisartan (MICARDIS) 80 MG tablet Take 1 tablet by mouth once daily 05/23/20  Yes Crecencio Mc, MD  traZODone (DESYREL) 50 MG tablet Take 1 tablet (50 mg total) by mouth at bedtime. 01/16/20  Yes Crecencio Mc, MD  warfarin (COUMADIN) 5 MG tablet Take 1 tablet (5 mg total) by mouth daily. Alternating with 6 mg dose. 05/15/20  Yes Crecencio Mc, MD  warfarin (COUMADIN) 6 MG tablet Take 1 tablet (6 mg total) by mouth daily. 04/30/20  Yes Crecencio Mc, MD  ALPRAZolam Duanne Moron) 0.25 MG tablet TAKE 1 TABLET BY MOUTH AT BEDTIME AS NEEDED FOR ANXIETY 04/24/20   Crecencio Mc, MD  doxycycline (VIBRA-TABS) 100 MG tablet Take 1 tablet (100 mg total) by mouth 2 (two) times daily. 05/14/20   Crecencio Mc,  MD  DULoxetine (CYMBALTA) 20 MG capsule Take 1 capsule by mouth once daily 04/23/20   Leone Haven, MD  glucose blood test strip Use to check blood sugar once daily. 11/26/18   Crecencio Mc, MD  nitroGLYCERIN (NITROSTAT) 0.4 MG SL tablet Place 1 tablet (0.4 mg total) under the tongue every 5 (five) minutes as needed for chest pain. 11/26/18   Crecencio Mc, MD  OneTouch Delica Lancets 82N MISC Use to check blood sugar once daily. 11/26/18   Crecencio Mc, MD  Syringe/Needle, Disp, (SYRINGE 3CC/25GX1") 25G X 1" 3 ML MISC Use for b12 injections 04/15/17   Crecencio Mc, MD  traMADol (ULTRAM) 50 MG tablet Take 1 tablet (50 mg total) by mouth every 8 (eight) hours as needed. 05/31/15   Crecencio Mc, MD  triamcinolone cream (KENALOG) 0.1 % Apply 1 application topically 2 (two) times daily. 05/14/20   Crecencio Mc, MD    Family History Family History  Problem Relation Age of Onset  . Hypertension Mother   . Cancer Mother   . Diabetes Mother   . Heart disease Father   . Hypertension Father   . Diabetes Father   . Breast cancer Paternal Aunt        <50  . Breast cancer  Cousin   . Alzheimer's disease Sister   . Breast cancer Sister 62       DCIS    Social History Social History   Tobacco Use  . Smoking status: Never Smoker  . Smokeless tobacco: Never Used  Vaping Use  . Vaping Use: Never used  Substance Use Topics  . Alcohol use: No  . Drug use: No     Allergies   Patient has no known allergies.   Review of Systems Review of Systems  Constitutional: Positive for fever. Negative for chills.  HENT: Positive for sore throat. Negative for ear pain.   Eyes: Negative for pain and visual disturbance.  Respiratory: Positive for cough. Negative for shortness of breath.   Cardiovascular: Negative for chest pain and palpitations.  Gastrointestinal: Negative for abdominal pain, diarrhea and vomiting.  Genitourinary: Negative for dysuria and hematuria.  Musculoskeletal: Negative for arthralgias and back pain.  Skin: Positive for rash. Negative for color change.  Neurological: Positive for headaches. Negative for seizures and syncope.  All other systems reviewed and are negative.    Physical Exam Triage Vital Signs ED Triage Vitals  Enc Vitals Group     BP      Pulse      Resp      Temp      Temp src      SpO2      Weight      Height      Head Circumference      Peak Flow      Pain Score      Pain Loc      Pain Edu?      Excl. in [place]?    No data found.  Updated Vital Signs BP 137/84 (BP Location: Left Arm)   Pulse 64   Temp 99.8 F (37.7 C) (Oral)   Resp 17   SpO2 94%   Visual Acuity Right Eye Distance:   Left Eye Distance:   Bilateral Distance:    Right Eye Near:   Left Eye Near:    Bilateral Near:     Physical Exam Vitals and nursing note reviewed.  Constitutional:  General: She is not in acute distress.    Appearance: She is well-developed.  HENT:     Head: Normocephalic and atraumatic.     Right Ear: Tympanic membrane normal.     Left Ear: Tympanic membrane normal.     Nose: Nose normal.      Mouth/Throat:     Mouth: Mucous membranes are moist.     Pharynx: Oropharynx is clear.  Eyes:     Conjunctiva/sclera: Conjunctivae normal.  Cardiovascular:     Rate and Rhythm: Normal rate and regular rhythm.     Heart sounds: Normal heart sounds.  Pulmonary:     Effort: Pulmonary effort is normal. No respiratory distress.     Breath sounds: Normal breath sounds.  Abdominal:     Palpations: Abdomen is soft.     Tenderness: There is no abdominal tenderness.  Musculoskeletal:     Cervical back: Neck supple.  Skin:    General: Skin is warm and dry.     Findings: Rash present.     Comments: Few scattered macular papular rash on right inner arm. Few scattered vesicles on dorsum of left foot.  No drainage.   Neurological:     General: No focal deficit present.     Mental Status: She is alert and oriented to person, place, and time.     Gait: Gait normal.  Psychiatric:        Mood and Affect: Mood normal.        Behavior: Behavior normal.      UC Treatments / Results  Labs (all labs ordered are listed, but only abnormal results are displayed) Labs Reviewed  COVID-19, FLU A+B NAA  CULTURE, GROUP A STREP [facility])  POCT RAPID STREP A (OFFICE)    EKG   Radiology No results found.  Procedures Procedures (including critical care time)  Medications Ordered in UC Medications - No data to display  Initial Impression / Assessment and Plan / UC Course  I have reviewed the triage vital signs and the nursing notes.  Pertinent labs & imaging results that were available during my care of the patient were reviewed by me and considered in my medical decision making (see chart for details).   Viral illness.  Rapid strep negative; culture pending.  Influenza and COVID pending.  Instructed patient to self quarantine until the test results are back.  Discussed symptomatic treatment including Tylenol or ibuprofen, rest, hydration.  Instructed patient to follow up with PCP if her symptoms  are not improving.  Patient agrees to plan of care.    Final Clinical Impressions(s) / UC Diagnoses   Final diagnoses:  Encounter for screening for COVID-19  Viral illness     Discharge Instructions     Your rapid strep test is negative.  A throat culture is pending; we will call you if it is positive requiring treatment.    Your COVID and Influenza tests are pending.  You should self quarantine until the test results are back.    Take Tylenol as needed for fever or discomfort.  Rest and keep yourself hydrated.    Follow-up with your primary care provider if your symptoms are not improving.        ED Prescriptions    None     I have reviewed the PDMP during this encounter.   Sharion Balloon, NP 05/24/20 1526

## 2020-05-24 NOTE — ED Triage Notes (Signed)
Patient c/o nonproductive cough, sore throat, rash, and headache x 2 days.   Patient endorses a temperature of 99.7F at home this morning.   Patient denies SOB or Chest Pain.   Patient endorses a scratchy throat.   Patient endorses photosensitivity.   Patient has rash present on RT arm and LFT Foot.   Patient endorses itching.   Patient has taken firocet and extra strength tylenol with no relief of symptoms.

## 2020-05-24 NOTE — Discharge Instructions (Addendum)
Your rapid strep test is negative.  A throat culture is pending; we will call you if it is positive requiring treatment.    Your COVID and Influenza tests are pending.  You should self quarantine until the test results are back.    Take Tylenol as needed for fever or discomfort.  Rest and keep yourself hydrated.    Follow-up with your primary care provider if your symptoms are not improving.

## 2020-05-24 NOTE — ED Triage Notes (Signed)
Pt in with co migraine hx of the same. Has taken prescribed meds out meds without relief. States worst migraine ever.

## 2020-05-25 ENCOUNTER — Other Ambulatory Visit: Payer: Self-pay

## 2020-05-25 ENCOUNTER — Ambulatory Visit
Admission: RE | Admit: 2020-05-25 | Discharge: 2020-05-25 | Disposition: A | Discharge status: Home / Self Care | Payer: PPO | Source: Ambulatory Visit | Attending: Internal Medicine | Admitting: Internal Medicine

## 2020-05-25 ENCOUNTER — Telehealth (INDEPENDENT_AMBULATORY_CARE_PROVIDER_SITE_OTHER): Payer: PPO | Admitting: Internal Medicine

## 2020-05-25 ENCOUNTER — Encounter: Payer: Self-pay | Admitting: Internal Medicine

## 2020-05-25 VITALS — BP 137/94 | Ht 62.0 in | Wt 168.0 lb

## 2020-05-25 DIAGNOSIS — I10 Essential (primary) hypertension: Secondary | ICD-10-CM | POA: Diagnosis not present

## 2020-05-25 DIAGNOSIS — B029 Zoster without complications: Secondary | ICD-10-CM | POA: Diagnosis not present

## 2020-05-25 DIAGNOSIS — M47812 Spondylosis without myelopathy or radiculopathy, cervical region: Secondary | ICD-10-CM

## 2020-05-25 DIAGNOSIS — Z7901 Long term (current) use of anticoagulants: Secondary | ICD-10-CM

## 2020-05-25 DIAGNOSIS — G43711 Chronic migraine without aura, intractable, with status migrainosus: Secondary | ICD-10-CM | POA: Diagnosis not present

## 2020-05-25 DIAGNOSIS — R519 Headache, unspecified: Secondary | ICD-10-CM | POA: Insufficient documentation

## 2020-05-25 LAB — COVID-19, FLU A+B NAA
Influenza A, NAA: NOT DETECTED
Influenza B, NAA: NOT DETECTED
SARS-CoV-2, NAA: DETECTED — AB

## 2020-05-25 MED ORDER — TIZANIDINE HCL 4 MG PO TABS: 4.0000 mg | ORAL_TABLET | Freq: Every evening | ORAL | 0 refills | Status: DC | PRN

## 2020-05-25 MED ORDER — AMLODIPINE BESYLATE 5 MG PO TABS
5.0000 mg | ORAL_TABLET | Freq: Every day | ORAL | 3 refills | Status: DC
Start: 2020-05-25 — End: 2021-06-06

## 2020-05-25 MED ORDER — VALACYCLOVIR HCL 1 G PO TABS: 1000.0000 mg | ORAL_TABLET | Freq: Three times a day (TID) | ORAL | 0 refills | Status: DC

## 2020-05-25 NOTE — Progress Notes (Signed)
Virtual Visit via Video Note  I connected with [name]   on 05/25/20 at 10:50 AM EDT by a video enabled telemedicine application and verified that I am speaking with the correct person using two identifiers.  Location patient: home, [place] Location provider:work or home office Persons participating in the virtual visit: patient, provider, Kenney Houseman   I discussed the limitations of evaluation and management by telemedicine and the availability of in person appointments. The patient expressed understanding and agreed to proceed.   HPI: Acute telemedicine visit for : 1. H/a and neck pain from front and back starting Tuesday pm 10/10 toda y8-9/10 no dizziness/lightheadedness h/a radiates to top of her head, feels weak due to lack of Po intake. H/o migraines typically takes fiorocet had tried this today excedrine, tylenol benadryl w/o relief. This is worse h/a. Having sore throat  She went to the ED stayed 4 hrs but left du e wait . 05/24/20 [facility] and strep rapid negative, pending strep culture and covid and flu  H/o migraines but this is different and the worse h/a and shes had migraines since 70 y.o On coumadin inr 05/14/20 was 3.0 and dose changed  She will recheck Tuesday   2. Shingles right arm started Saturday qhs/sunday hurts for clothes to touch   Symptoms started Tuesday afternoon, no one around the Patient is sick. Headache, sore throat. Patient was seen at the urgent care and was diagnosed with shingles. Covid test from urgent care has not come back yet. Did a home test Wednesday that was negative.  -COVID-19 vaccine status: 2/2  ROS: See pertinent positives and negatives per HPI.  Past Medical History:  Diagnosis Date  . B12 deficiency   . Clotting disorder ([place])   . Depression   . Diabetes mellitus without complication ([place])    pre diabetic  . GERD (gastroesophageal reflux disease)   . H/O toxic shock syndrome 2001   post urologic procedure for stone removal  .  Headache    migraines  . 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 HOH (hard of hearing)    wears hearing aids  . Hyperlipidemia   . Hypertension   . Ischemic colitis ([place]) 2012-2013   s/p left colectomy May 2013  . Ischemic colitis ([place])   . Kidney stones   . PONV (postoperative nausea and vomiting)     Past Surgical History:  Procedure Laterality Date  . ABDOMINAL HYSTERECTOMY  1996  . APPENDECTOMY    . bilateral tubal ligation    . BREAST EXCISIONAL BIOPSY Left 1989   benign  . BREAST SURGERY  1989   left biopsy, normal  . CATARACT EXTRACTION W/PHACO Right 05/30/2019   Procedure: CATARACT EXTRACTION PHACO AND INTRAOCULAR LENS PLACEMENT (IOC) RIGHT DIABETIC;  Surgeon: Eulogio Bear, MD;  Location: [place];  Service: Ophthalmology;  Laterality: Right;  3.84 0:37.1  . CATARACT EXTRACTION W/PHACO Left 06/20/2019   Procedure: CATARACT EXTRACTION PHACO AND INTRAOCULAR LENS PLACEMENT (IOC) LEFT DIABETIC 2.58  00:29.3;  Surgeon: Eulogio Bear, MD;  Location: [place];  Service: Ophthalmology;  Laterality: Left;  Diabetic  . COLECTOMY Left May 2013   [place], ischemic colitis  . COLONOSCOPY WITH PROPOFOL N/A 11/16/2014   Procedure: COLONOSCOPY WITH PROPOFOL;  Surgeon: Manya Silvas, MD;  Location[facility] ENDOSCOPY;  Service: Endoscopy;  Laterality: N/A;  . ESOPHAGOGASTRODUODENOSCOPY (EGD) WITH PROPOFOL N/A 11/16/2014   Procedure:  ESOPHAGOGASTRODUODENOSCOPY (EGD) WITH PROPOFOL;  Surgeon: Manya Silvas, MD;  Location[facility] ENDOSCOPY;  Service: Endoscopy;  Laterality: N/A;  . SPINE SURGERY  1984   lumbar diskectomy, [place]  . ureterolithotomy     Dr. Rogers Blocker urology ~ 2012 kidney stones in ICU after surgery     Current Outpatient Medications:  .  ALPRAZolam (XANAX) 0.25 MG tablet, TAKE 1 TABLET BY MOUTH AT BEDTIME AS NEEDED FOR ANXIETY, Disp: 30 tablet, Rfl: 1 .  aspirin 81 MG tablet, Take 81 mg by  mouth daily., Disp: , Rfl:  .  atorvastatin (LIPITOR) 40 MG tablet, Take 1 tablet (40 mg total) by mouth daily., Disp: 90 tablet, Rfl: 0 .  buPROPion (WELLBUTRIN XL) 300 MG 24 hr tablet, Take 1 tablet (300 mg total) by mouth daily., Disp: 30 tablet, Rfl: 1 .  butalbital-acetaminophen-caffeine (FIORICET) 50-325-40 MG tablet, TAKE 1 TABLET BY MOUTH EVERY 12 HOURS AS NEEDED FOR HEADACHE, Disp: 60 tablet, Rfl: 0 .  Cholecalciferol (VITAMIN D3) 1000 UNITS CAPS, Take by mouth., Disp: , Rfl:  .  cyanocobalamin (,VITAMIN B-12,) 1000 MCG/ML injection, INJECT 1ML INTO THE MUSCLE EVERY 30 DAYS, Disp: 10 mL, Rfl: 0 .  furosemide (LASIX) 20 MG tablet, Take 1 tablet by mouth once daily, Disp: 90 tablet, Rfl: 0 .  metoprolol succinate (TOPROL-XL) 100 MG 24 hr tablet, TAKE 1 TABLET BY MOUTH AT BEDTIME (TAKE  WITH  OR  IMMEDIATELY  FOLLOWING  A  MEAL), Disp: 90 tablet, Rfl: 0 .  Omega-3 Fatty Acids (FISH OIL) 1000 MG CAPS, Take 1 capsule daily by mouth., Disp: , Rfl:  .  omeprazole (PRILOSEC) 20 MG capsule, Take 1 capsule (20 mg total) by mouth 2 (two) times daily before a meal., Disp: 60 capsule, Rfl: 2 .  oxybutynin (DITROPAN-XL) 5 MG 24 hr tablet, Take 1 tablet (5 mg total) by mouth at bedtime., Disp: 90 tablet, Rfl: 0 .  telmisartan (MICARDIS) 80 MG tablet, Take 1 tablet by mouth once daily, Disp: 90 tablet, Rfl: 0 .  tiZANidine (ZANAFLEX) 4 MG tablet, Take 1 tablet (4 mg total) by mouth at bedtime as needed for muscle spasms., Disp: 30 tablet, Rfl: 0 .  traZODone (DESYREL) 50 MG tablet, Take 1 tablet (50 mg total) by mouth at bedtime., Disp: 90 tablet, Rfl: 1 .  triamcinolone cream (KENALOG) 0.1 %, Apply 1 application topically 2 (two) times daily., Disp: 30 g, Rfl: 0 .  valACYclovir (VALTREX) 1000 MG tablet, Take 1 tablet (1,000 mg total) by mouth 3 (three) times daily. With food, Disp: 30 tablet, Rfl: 0 .  warfarin (COUMADIN) 5 MG tablet, Take 1 tablet (5 mg total) by mouth daily. Alternating with 6 mg dose.,  Disp: 30 tablet, Rfl: 3 .  warfarin (COUMADIN) 6 MG tablet, Take 1 tablet (6 mg total) by mouth daily., Disp: 90 tablet, Rfl: 0 .  amLODipine (NORVASC) 5 MG tablet, Take 1 tablet (5 mg total) by mouth daily., Disp: 90 tablet, Rfl: 3 .  doxycycline (VIBRA-TABS) 100 MG tablet, Take 1 tablet (100 mg total) by mouth 2 (two) times daily. (Patient not taking: Reported on 05/25/2020), Disp: 20 tablet, Rfl: 0 .  DULoxetine (CYMBALTA) 20 MG capsule, Take 1 capsule by mouth once daily (Patient not taking: Reported on 05/25/2020), Disp: 90 capsule, Rfl: 0 .  glucose blood test strip, Use to check blood sugar once daily., Disp: 100 each, Rfl: 5 .  nitroGLYCERIN (NITROSTAT) 0.4 MG SL tablet, Place 1 tablet (0.4 mg total) under the tongue  every 5 (five) minutes as needed for chest pain. (Patient not taking: Reported on 05/25/2020), Disp: 50 tablet, Rfl: 3 .  OneTouch Delica Lancets 88C MISC, Use to check blood sugar once daily., Disp: 100 each, Rfl: 5 .  Syringe/Needle, Disp, (SYRINGE 3CC/25GX1") 25G X 1" 3 ML MISC, Use for b12 injections, Disp: 50 each, Rfl: 0 .  traMADol (ULTRAM) 50 MG tablet, Take 1 tablet (50 mg total) by mouth every 8 (eight) hours as needed. (Patient not taking: Reported on 05/25/2020), Disp: 90 tablet, Rfl: 4  Current Facility-Administered Medications:  .  cyanocobalamin ((VITAMIN B-12)) injection 1,000 mcg, 1,000 mcg, Intramuscular, Once, Derrel Nip, Aris Everts, MD  EXAM:  VITALS per patient if applicable:  GENERAL: alert, oriented, appears well and in + distress due to h/a  HEENT: atraumatic, conjunttiva clear, no obvious abnormalities on inspection of external nose and ears  NECK: normal movements of the head and neck  LUNGS: on inspection no signs of respiratory distress, breathing rate appears normal, no obvious gross SOB, gasping or wheezing  CV: no obvious cyanosis  MS: moves all visible extremities without noticeable abnormality  PSYCH/NEURO: pleasant and cooperative, no obvious  depression or anxiety, speech and thought processing grossly intact  ASSESSMENT AND PLAN:  Discussed the following assessment and plan:  Herpes zoster without complication - Plan: valACYclovir (VALTREX) 1000 MG tablet tid x 7-10 days if better after 7 days stop  Prn lidocaine   Hypertension, uncontrolled- Plan: amLODipine (NORVASC) 5 MG tablet increase from 2.5 normally BP 160s/170s on micardis 80 mg qd toprol 100 mg qd  Take for now another dose of 2.5 until gets 5 mg qd   Intractable headache,with h/o migraines but worst h/a of her life - Plan: CT Head Wo Contrast Intractable chronic migraine without aura and with status migrainosus - Plan: CT Head Wo Contrast Has fiorcet prn, benadryl, tylenol Pending covid 19 test  She need neurology consult  Hydration  If worse go to ED via 911 Avoid nsaids due to coumadin  Cervical arthritis - Plan: tiZANidine (ZANAFLEX) 4 MG tablet Occipital headache - Plan: tiZANidine (ZANAFLEX) 4 MG tablet  Anticoagulated on Coumadin - Plan: Protime-INR Tuesday at [facility] with valtrex use   -we discussed possible serious and likely etiologies, options for evaluation and workup, limitations of telemedicine visit vs in person visit, treatment, treatment risks and precautions.   I discussed the assessment and treatment plan with the patient. The patient was provided an opportunity to ask questions and all were answered. The patient agreed with the plan and demonstrated an understanding of the instructions.    Time spent 30 minutes  Delorise Jackson, MD

## 2020-05-25 NOTE — Patient Instructions (Signed)
over the counter meds:  Mucinex dm green label for cough.  Vitamin C 1000 mg daily.  Vitamin D3 4000 Iu (units) daily.  Zinc 100 mg daily.  Quercetin 250-500 mg 2 times per day   Elderberry  Oil of oregano  cepacol or chloroseptic spray  Warm tea with honey and lemon  Hydration  Try to eat though you dont feel like it   Tylenol or Advil  Nasal saline  Flonase   allergy medication claritin/allegra,zyrtec xyzal if sneezing or runny nose   If covid 19 + Monitor pulse oximeter, buy from [facility] if oxygen is less than 90 please go to the hospital.    Are you feeling really sick? Shortness of breath, cough, chest pain?, dizziness? Confusion   If so let me know  If worsening, go to hospital or [facility] Urgent care for further treatment   Shingles  Shingles, which is also known as herpes zoster, is an infection that causes a painful skin rash and fluid-filled blisters. It is caused by a virus. Shingles only develops in people who:  Have had chickenpox.  Have been given a medicine to protect against chickenpox (have been vaccinated). Shingles is rare in this group. What are the causes? Shingles is caused by varicella-zoster virus (VZV). This is the same virus that causes chickenpox. After a person is exposed to VZV, the virus stays in the body in an inactive (dormant) state. Shingles develops if the virus is reactivated. This can happen many years after the first (initial) exposure to VZV. It is not known what causes this virus to be reactivated. What increases the risk? People who have had chickenpox or received the chickenpox vaccine are at risk for shingles. Shingles infection is more common in people who:  Are older than age 26.  Have a weakened disease-fighting system (immune system), such as people with: ? HIV. ? AIDS. ? Cancer.  Are taking medicines that weaken the immune system, such as transplant medicines.  Are experiencing a lot of stress. What are the signs or  symptoms? Early symptoms of this condition include itching, tingling, and pain in an area on your skin. Pain may be described as burning, stabbing, or throbbing. A few days or weeks after early symptoms start, a painful red rash appears. The rash is usually on one side of the body and has a band-like or belt-like pattern. The rash eventually turns into fluid-filled blisters that break open, change into scabs, and dry up in about 2-3 weeks. At any time during the infection, you may also develop:  A fever.  Chills.  A headache.  An upset stomach. How is this diagnosed? This condition is diagnosed with a skin exam. Skin or fluid samples may be taken from the blisters before a diagnosis is made. These samples are examined under a microscope or sent to a lab for testing. How is this treated? The rash may last for several weeks. There is not a specific cure for this condition. Your health care provider will probably prescribe medicines to help you manage pain, recover more quickly, and avoid long-term problems. Medicines may include:  Antiviral drugs.  Anti-inflammatory drugs.  Pain medicines.  Anti-itching medicines (antihistamines). If the area involved is on your face, you may be referred to a specialist, such as an eye doctor (ophthalmologist) or an ear, nose, and throat (ENT) doctor (otolaryngologist) to help you avoid eye problems, chronic pain, or disability. Follow these instructions at home: Medicines  Take over-the-counter and prescription medicines  only as told by your health care provider.  Apply an anti-itch cream or numbing cream to the affected area as told by your health care provider. Relieving itching and discomfort  Apply cold, wet cloths (cold compresses) to the area of the rash or blisters as told by your health care provider.  Cool baths can be soothing. Try adding baking soda or dry oatmeal to the water to reduce itching. Do not bathe in hot water.   Blister and  rash care  Keep your rash covered with a loose bandage (dressing). Wear loose-fitting clothing to help ease the pain of material rubbing against the rash.  Keep your rash and blisters clean by washing the area with mild soap and cool water as told by your health care provider.  Check your rash every day for signs of infection. Check for: ? More redness, swelling, or pain. ? Fluid or blood. ? Warmth. ? Pus or a bad smell.  Do not scratch your rash or pick at your blisters. To help avoid scratching: ? Keep your fingernails clean and cut short. ? Wear gloves or mittens while you sleep, if scratching is a problem. General instructions  Rest as told by your health care provider.  Keep all follow-up visits as told by your health care provider. This is important.  Wash your hands often with soap and water. If soap and water are not available, use hand sanitizer. Doing this lowers your chance of getting a bacterial skin infection.  Before your blisters change into scabs, your shingles infection can cause chickenpox in people who have never had it or have never been vaccinated against it. To prevent this from happening, avoid contact with other people, especially: ? Babies. ? Pregnant women. ? Children who have eczema. ? Elderly people who have transplants. ? People who have chronic illnesses, such as cancer or AIDS. Contact a health care provider if:  Your pain is not relieved with prescribed medicines.  Your pain does not get better after the rash heals.  You have signs of infection in the rash area, such as: ? More redness, swelling, or pain around the rash. ? Fluid or blood coming from the rash. ? The rash area feeling warm to the touch. ? Pus or a bad smell coming from the rash. Get help right away if:  The rash is on your face or nose.  You have facial pain, pain around your eye area, or loss of feeling on one side of your face.  You have difficulty seeing.  You have ear  pain or have ringing in your ear.  You have a loss of taste.  Your condition gets worse. Summary  Shingles, which is also known as herpes zoster, is an infection that causes a painful skin rash and fluid-filled blisters.  This condition is diagnosed with a skin exam. Skin or fluid samples may be taken from the blisters and examined before the diagnosis is made.  Keep your rash covered with a loose bandage (dressing). Wear loose-fitting clothing to help ease the pain of material rubbing against the rash.  Before your blisters change into scabs, your shingles infection can cause chickenpox in people who have never had it or have never been vaccinated against it. This information is not intended to replace advice given to you by your health care provider. Make sure you discuss any questions you have with your health care provider. Document Revised: 04/16/2018 Document Reviewed: 08/27/2016 Elsevier Patient Education  2021 [employer].

## 2020-05-25 NOTE — Progress Notes (Signed)
Symptoms started Tuesday afternoon, no one around the Patient is sick. Headache, sore throat. Patient was seen at the urgent care and was diagnosed with shingles. Covid test from urgent care has not come back yet. Did a home test Wednesday that was negative.

## 2020-05-27 LAB — CULTURE, GROUP A STREP (THRC)

## 2020-06-08 ENCOUNTER — Other Ambulatory Visit: Payer: Self-pay

## 2020-06-08 ENCOUNTER — Telehealth: Payer: Self-pay | Admitting: Internal Medicine

## 2020-06-08 DIAGNOSIS — E538 Deficiency of other specified B group vitamins: Secondary | ICD-10-CM

## 2020-06-08 MED ORDER — "SYRINGE 25G X 1"" 3 ML MISC": 0 refills | Status: DC

## 2020-06-08 NOTE — Telephone Encounter (Signed)
PT states that when they were last here they spoke with Dr.Tullo nurse in getting a refill of Syringe/Needle, Disp, (SYRINGE 3CC/25GX1") 25G X 1" 3 ML MISC. They are calling today to request a refill to be called in.

## 2020-06-08 NOTE — Telephone Encounter (Signed)
Medication has been refilled.

## 2020-06-21 ENCOUNTER — Other Ambulatory Visit: Payer: Self-pay

## 2020-06-21 ENCOUNTER — Encounter: Payer: Self-pay | Admitting: Internal Medicine

## 2020-06-21 ENCOUNTER — Ambulatory Visit (INDEPENDENT_AMBULATORY_CARE_PROVIDER_SITE_OTHER): Payer: PPO | Admitting: Internal Medicine

## 2020-06-21 VITALS — BP 132/70 | HR 58 | Temp 97.3°F | Resp 15 | Ht 62.0 in | Wt 167.0 lb

## 2020-06-21 DIAGNOSIS — L509 Urticaria, unspecified: Secondary | ICD-10-CM | POA: Diagnosis not present

## 2020-06-21 DIAGNOSIS — R600 Localized edema: Secondary | ICD-10-CM

## 2020-06-21 MED ORDER — METHYLPREDNISOLONE ACETATE 40 MG/ML IJ SUSP
40.0000 mg | Freq: Once | INTRAMUSCULAR | Status: AC
  Administered 2020-06-21: 40 mg via INTRAMUSCULAR

## 2020-06-21 MED ORDER — PREDNISONE 10 MG PO TABS: ORAL_TABLET | ORAL | 0 refills | Status: DC

## 2020-06-21 NOTE — Progress Notes (Signed)
Subjective:  Patient ID: [name], female    DOB: [birth year]  Age: 70 y.o. MRN: [id number]  CC: The primary encounter diagnosis was Hives. A diagnosis of Facial edema was also pertinent to this visit.  HPI [name] presents for multiple issues   This visit occurred during the SARS-CoV-2 public health emergency.  Safety protocols were in place, including screening questions prior to the visit, additional usage of staff PPE, and extensive cleaning of exam room while observing appropriate contact time as indicated for disinfecting solutions.  Positive COVID test on May 19  by  urgent Care for COVID . Treated by [facility] on May 20 for concurrent shingles of right arm with valtrex  but stopped it after 4 days , due to rapid resolution of symptoms and new onset diarrhea which has since resolved. Also had amlodipine dose incresaed to 5 mg on May 20.   Developed new onset hives which started on  the left side of her face  on June 12 ,  no lip swelling until today ( no trouble swallowing,  no odynophagia or sob)  and has developed a new rash on the right  on both sides of chest .. the hives are itchy .  Marland Kitchen  Did not go to ER .  No fevers,  some nausea,  intermittent since COVID , occurring about twice daily , brief episodes, without vomiting .  Yesterday took a benadryl an using  topical cortisone    No new meds  other than valtrex.   No recent refills of chronic meds.  No changes in soaps, detergents.   Outpatient Medications Prior to Visit  Medication Sig Dispense Refill   ALPRAZolam (XANAX) 0.25 MG tablet TAKE 1 TABLET BY MOUTH AT BEDTIME AS NEEDED FOR ANXIETY 30 tablet 1   amLODipine (NORVASC) 5 MG tablet Take 1 tablet (5 mg total) by mouth daily. 90 tablet 3   aspirin 81 MG tablet Take 81 mg by mouth daily.     atorvastatin (LIPITOR) 40 MG tablet Take 1 tablet (40 mg total) by mouth daily. 90 tablet 0   buPROPion (WELLBUTRIN XL) 300 MG 24 hr tablet Take 1 tablet (300 mg total) by mouth daily. 30  tablet 1   butalbital-acetaminophen-caffeine (FIORICET) 50-325-40 MG tablet TAKE 1 TABLET BY MOUTH EVERY 12 HOURS AS NEEDED FOR HEADACHE 60 tablet 0   Cholecalciferol (VITAMIN D3) 1000 UNITS CAPS Take by mouth.     cyanocobalamin (,VITAMIN B-12,) 1000 MCG/ML injection INJECT 1ML INTO THE MUSCLE EVERY 30 DAYS 10 mL 0   doxycycline (VIBRA-TABS) 100 MG tablet Take 1 tablet (100 mg total) by mouth 2 (two) times daily. 20 tablet 0   DULoxetine (CYMBALTA) 20 MG capsule Take 1 capsule by mouth once daily 90 capsule 0   furosemide (LASIX) 20 MG tablet Take 1 tablet by mouth once daily 90 tablet 0   glucose blood test strip Use to check blood sugar once daily. 100 each 5   metoprolol succinate (TOPROL-XL) 100 MG 24 hr tablet TAKE 1 TABLET BY MOUTH AT BEDTIME (TAKE  WITH  OR  IMMEDIATELY  FOLLOWING  A  MEAL) 90 tablet 0   nitroGLYCERIN (NITROSTAT) 0.4 MG SL tablet Place 1 tablet (0.4 mg total) under the tongue every 5 (five) minutes as needed for chest pain. 50 tablet 3   Omega-3 Fatty Acids (FISH OIL) 1000 MG CAPS Take 1 capsule daily by mouth.     omeprazole (PRILOSEC) 20 MG capsule Take 1  capsule (20 mg total) by mouth 2 (two) times daily before a meal. 60 capsule 2   OneTouch Delica Lancets 14G MISC Use to check blood sugar once daily. 100 each 5   oxybutynin (DITROPAN-XL) 5 MG 24 hr tablet Take 1 tablet (5 mg total) by mouth at bedtime. 90 tablet 0   Syringe/Needle, Disp, (SYRINGE 3CC/25GX1") 25G X 1" 3 ML MISC Use for b12 injections 50 each 0   telmisartan (MICARDIS) 80 MG tablet Take 1 tablet by mouth once daily 90 tablet 0   tiZANidine (ZANAFLEX) 4 MG tablet Take 1 tablet (4 mg total) by mouth at bedtime as needed for muscle spasms. 30 tablet 0   traMADol (ULTRAM) 50 MG tablet Take 1 tablet (50 mg total) by mouth every 8 (eight) hours as needed. 90 tablet 4   traZODone (DESYREL) 50 MG tablet Take 1 tablet (50 mg total) by mouth at bedtime. 90 tablet 1   triamcinolone cream (KENALOG) 0.1 % Apply 1  application topically 2 (two) times daily. 30 g 0   valACYclovir (VALTREX) 1000 MG tablet Take 1 tablet (1,000 mg total) by mouth 3 (three) times daily. With food 30 tablet 0   warfarin (COUMADIN) 5 MG tablet Take 1 tablet (5 mg total) by mouth daily. Alternating with 6 mg dose. 30 tablet 3   warfarin (COUMADIN) 6 MG tablet Take 1 tablet (6 mg total) by mouth daily. 90 tablet 0   Facility-Administered Medications Prior to Visit  Medication Dose Route Frequency Provider Last Rate Last Admin   cyanocobalamin ((VITAMIN B-12)) injection 1,000 mcg  1,000 mcg Intramuscular Once Crecencio Mc, MD        Review of Systems;  Patient denies headache, fevers, malaise, unintentional weight loss, skin rash, eye pain, sinus congestion and sinus pain, sore throat, dysphagia,  hemoptysis , cough, dyspnea, wheezing, chest pain, palpitations, orthopnea, edema, abdominal pain, nausea, melena, diarrhea, constipation, flank pain, dysuria, hematuria, urinary  Frequency, nocturia, numbness, tingling, seizures,  Focal weakness, Loss of consciousness,  Tremor, insomnia, depression, anxiety, and suicidal ideation.      Objective:  BP 132/70 (BP Location: Left Arm, Patient Position: Sitting, Cuff Size: Large)   Pulse (!) 58   Temp (!) 97.3 F (36.3 C) (Temporal)   Resp 15   Ht 5\' 2"  (1.575 m)   Wt 167 lb (75.8 kg)   SpO2 97%   BMI 30.54 kg/m   BP Readings from Last 3 Encounters:  06/21/20 132/70  05/25/20 (!) 137/94  05/24/20 (!) 154/79    Wt Readings from Last 3 Encounters:  06/21/20 167 lb (75.8 kg)  05/25/20 168 lb (76.2 kg)  05/24/20 170 lb (77.1 kg)    General appearance: alert, cooperative and appears stated age Ears: normal TM's and external ear canals both ears Face:  perioribtal edema,  left side of face more edematous than right. EOMI Throat: lips, mucosa, and tongue normal; teeth and gums normal Neck: no adenopathy, no carotid bruit, supple, symmetrical, trachea midline and thyroid not  enlarged, symmetric, no tenderness/mass/nodules Back: symmetric, no curvature. ROM normal. No CVA tenderness. Lungs: clear to auscultation bilaterally Heart: regular rate and rhythm, S1, S2 normal, no murmur, click, rub or gallop Abdomen: soft, non-tender; bowel sounds normal; no masses,  no organomegaly Pulses: 2+ and symmetric Skin: chest wall and righ arm with diffused erythematous papular rash .  Lymph: cervical, supraclavicular, and axillary nodes normal.  Lab Results  Component Value Date   HGBA1C 6.3 (H) 05/14/2020   HGBA1C  6.2 01/13/2020   HGBA1C 6.3 (H) 08/08/2019    Lab Results  Component Value Date   CREATININE 0.97 05/14/2020   CREATININE 1.05 01/13/2020   CREATININE 1.00 01/20/2019    Lab Results  Component Value Date   WBC 5.6 06/21/2020   HGB 14.4 06/21/2020   HCT 44.3 06/21/2020   PLT 163 06/21/2020   GLUCOSE 103 (H) 05/14/2020   CHOL 162 01/13/2020   TRIG 132.0 01/13/2020   HDL 48.60 01/13/2020   LDLDIRECT 111.0 08/13/2016   LDLCALC 87 01/13/2020   ALT 19 05/14/2020   AST 16 05/14/2020   NA 142 05/14/2020   K 3.7 05/14/2020   CL 105 05/14/2020   CREATININE 0.97 05/14/2020   BUN 14 05/14/2020   CO2 23 05/14/2020   TSH 2.62 11/18/2016   INR 3.0 (H) 05/14/2020   HGBA1C 6.3 (H) 05/14/2020   MICROALBUR 2.2 (H) 11/13/2017    CT Head Wo Contrast  Result Date: 05/25/2020 CLINICAL DATA:  Headache EXAM: CT HEAD WITHOUT CONTRAST TECHNIQUE: Contiguous axial images were obtained from the base of the skull through the vertex without intravenous contrast. COMPARISON:  04/02/2017 FINDINGS: Brain: No evidence of acute infarction, hemorrhage, hydrocephalus, extra-axial collection or mass lesion/mass effect. Scattered low-density changes within the periventricular and subcortical white matter compatible with chronic microvascular ischemic change. Vascular: Atherosclerotic calcifications involving the large vessels of the skull base. No unexpected hyperdense vessel.  Skull: Normal. Negative for fracture or focal lesion. Sinuses/Orbits: No acute finding. Other: None. IMPRESSION: No acute intracranial findings. Electronically Signed   By: Davina Poke D.O.   On: 05/25/2020 13:37    Assessment & Plan:   Problem List Items Addressed This Visit       Unprioritized   Facial edema    Concurrent with rash on chest wall and arms. No signs of angioedema or anaphylaxis . IM dose of methylprednisolone   40 mg given in office.   Will prescribe a prednisone taper,   twice daily nonsedating anthistamin and famotidine 20 mg  bid for one week  Etiology unclear: most recent med change was valtrex for shingles.  Follow up one week  Lab Results  Component Value Date   WBC 5.6 06/21/2020   HGB 14.4 06/21/2020   HCT 44.3 06/21/2020   MCV 86 06/21/2020   PLT 163 06/21/2020          Other Visit Diagnoses     Hives    -  Primary   Relevant Medications   methylPREDNISolone acetate (DEPO-MEDROL) injection 40 mg (Completed)   Other Relevant Orders   CBC with Differential/Platelet (Completed)       I am having [name] start on predniSONE. I am also having her maintain her Vitamin D3, aspirin, traMADol, Fish Oil, glucose blood, OneTouch Delica Lancets 32R, nitroGLYCERIN, omeprazole, traZODone, DULoxetine, furosemide, ALPRAZolam, cyanocobalamin, atorvastatin, warfarin, oxybutynin, buPROPion, triamcinolone cream, doxycycline, warfarin, butalbital-acetaminophen-caffeine, telmisartan, metoprolol succinate, valACYclovir, amLODipine, tiZANidine, and SYRINGE 3CC/25GX1". We administered methylPREDNISolone acetate. We will continue to administer cyanocobalamin.  Meds ordered this encounter  Medications   predniSONE (DELTASONE) 10 MG tablet    Sig: 6 tablets on Day 1 , then reduce by 1 tablet daily until gone    Dispense:  21 tablet    Refill:  0   methylPREDNISolone acetate (DEPO-MEDROL) injection 40 mg    There are no discontinued medications.  Follow-up:  Return in about 1 week (around 06/28/2020).   Crecencio Mc, MD

## 2020-06-21 NOTE — Patient Instructions (Addendum)
For your hives reaction:   You received a dose of Depo  medrol today 40 mg   I have prescribed a prednisone taper  to start tomorrow   PLEASE START TAKING Cetirizine (zyrtec) every 12 hours plus famotidine    (pEPCID)20 mg every 12 hours starting today .  THESE CAN BE PURCHASED OTC AT    CONTINUE USING CORTISONE OR TRY ADDING BENADRYL CREAM FOR THE ITCHING   CALL YOUR PHARMACIST AND FIND OUT IF ANYTHING WAS REFILLED LAST WEEK WITH A DIFFERENT GENERIC PROVIDER

## 2020-06-22 LAB — CBC WITH DIFFERENTIAL/PLATELET
Basophils Absolute: 0 10*3/uL (ref 0.0–0.2)
Basos: 1 %
EOS (ABSOLUTE): 0.4 10*3/uL (ref 0.0–0.4)
Eos: 7 %
Hematocrit: 44.3 % (ref 34.0–46.6)
Hemoglobin: 14.4 g/dL (ref 11.1–15.9)
Immature Grans (Abs): 0 10*3/uL (ref 0.0–0.1)
Immature Granulocytes: 0 %
Lymphocytes Absolute: 2 10*3/uL (ref 0.7–3.1)
Lymphs: 35 %
MCH: 28.1 pg (ref 26.6–33.0)
MCHC: 32.5 g/dL (ref 31.5–35.7)
MCV: 86 fL (ref 79–97)
Monocytes Absolute: 0.6 10*3/uL (ref 0.1–0.9)
Monocytes: 11 %
Neutrophils Absolute: 2.6 10*3/uL (ref 1.4–7.0)
Neutrophils: 46 %
Platelets: 163 10*3/uL (ref 150–450)
RBC: 5.13 x10E6/uL (ref 3.77–5.28)
RDW: 14.1 % (ref 11.7–15.4)
WBC: 5.6 10*3/uL (ref 3.4–10.8)

## 2020-06-24 DIAGNOSIS — R600 Localized edema: Secondary | ICD-10-CM | POA: Insufficient documentation

## 2020-06-24 NOTE — Assessment & Plan Note (Addendum)
Concurrent with rash on chest wall and arms. No signs of angioedema or anaphylaxis . IM dose of methylprednisolone   40 mg given in office.   Will prescribe a prednisone taper,   twice daily nonsedating anthistamin and famotidine 20 mg  bid for one week  Etiology unclear: most recent med change was valtrex for shingles.  Follow up one week  Lab Results  Component Value Date   WBC 5.6 06/21/2020   HGB 14.4 06/21/2020   HCT 44.3 06/21/2020   MCV 86 06/21/2020   PLT 163 06/21/2020

## 2020-06-28 ENCOUNTER — Other Ambulatory Visit: Payer: Self-pay

## 2020-06-28 ENCOUNTER — Ambulatory Visit (INDEPENDENT_AMBULATORY_CARE_PROVIDER_SITE_OTHER): Payer: PPO | Admitting: Internal Medicine

## 2020-06-28 ENCOUNTER — Encounter: Payer: Self-pay | Admitting: Internal Medicine

## 2020-06-28 VITALS — BP 144/76 | HR 71 | Temp 95.7°F | Resp 15 | Ht 62.0 in | Wt 166.8 lb

## 2020-06-28 DIAGNOSIS — E1129 Type 2 diabetes mellitus with other diabetic kidney complication: Secondary | ICD-10-CM | POA: Diagnosis not present

## 2020-06-28 DIAGNOSIS — F4329 Adjustment disorder with other symptoms: Secondary | ICD-10-CM | POA: Diagnosis not present

## 2020-06-28 DIAGNOSIS — R600 Localized edema: Secondary | ICD-10-CM | POA: Diagnosis not present

## 2020-06-28 DIAGNOSIS — F4381 Prolonged grief disorder: Secondary | ICD-10-CM

## 2020-06-28 DIAGNOSIS — Z7901 Long term (current) use of anticoagulants: Secondary | ICD-10-CM | POA: Diagnosis not present

## 2020-06-28 DIAGNOSIS — R809 Proteinuria, unspecified: Secondary | ICD-10-CM | POA: Diagnosis not present

## 2020-06-28 LAB — COMPREHENSIVE METABOLIC PANEL
ALT: 19 U/L (ref 0–35)
AST: 13 U/L (ref 0–37)
Albumin: 4.2 g/dL (ref 3.5–5.2)
Alkaline Phosphatase: 88 U/L (ref 39–117)
BUN: 23 mg/dL (ref 6–23)
CO2: 34 mEq/L — ABNORMAL HIGH (ref 19–32)
Calcium: 9.3 mg/dL (ref 8.4–10.5)
Chloride: 102 mEq/L (ref 96–112)
Creatinine, Ser: 1.13 mg/dL (ref 0.40–1.20)
GFR: 49.04 mL/min — ABNORMAL LOW (ref >60.00)
Glucose, Bld: 72 mg/dL (ref 70–99)
Potassium: 3.9 mEq/L (ref 3.5–5.1)
Sodium: 144 mEq/L (ref 135–145)
Total Bilirubin: 0.5 mg/dL (ref 0.2–1.2)
Total Protein: 6.1 g/dL (ref 6.0–8.3)

## 2020-06-28 LAB — PROTIME-INR
INR: 3.3 ratio — ABNORMAL HIGH (ref 0.8–1.0)
Prothrombin Time: 36.1 s — ABNORMAL HIGH (ref 9.6–13.1)

## 2020-06-28 MED ORDER — OZEMPIC (0.25 OR 0.5 MG/DOSE) 2 MG/1.5ML ~~LOC~~ SOPN: 0.2500 mg | PEN_INJECTOR | SUBCUTANEOUS | 2 refills | Status: DC

## 2020-06-28 MED ORDER — MOMETASONE FUROATE 0.1 % EX CREA: TOPICAL_CREAM | Freq: Every day | CUTANEOUS | 1 refills | Status: DC

## 2020-06-28 NOTE — Progress Notes (Signed)
Subjective:  Patient ID: [name], female    DOB: [birth year]  Age: 70 y.o. MRN: [id number]  CC: The primary encounter diagnosis was Long term current use of anticoagulant therapy. Diagnoses of Controlled type 2 diabetes mellitus with microalbuminuria, without long-term current use of insulin ([place]), Morbid obesity ([place]), Facial edema, and Grief reaction with prolonged bereavement were also pertinent to this visit.  HPI [name] presents for one week follow up on treatment for hives,  uncertain origin  This visit occurred during the SARS-CoV-2 public health emergency.  Safety protocols were in place, including screening questions prior to the visit, additional usage of staff PPE, and extensive cleaning of exam room while observing appropriate contact time as indicated for disinfecting solutions.   Patient was evaluated one week ago for hives and facial swelling of uncertain origin, in the setting of recent shingles episode treated with valacyclovir.  She was  given dexamethasone IM injection,  a 6 day prednisone taper., and complete histamine blockade with antihistamine and famotidine both taken bid. She returns today in improved condition.  The rash from the singles episode is resolving on her right inner upper arm   T2DM:  she is frustrated by her obesity and inability to lose weight. Discussed the risks and benefits of Ozempic for wt los.  No histor of thyroid ca.  Willing to start   Right ear bled  weeks ago, painless, found blood  on pillow. Had no recent sinusitis/otitis, had not flown or scuba dived. No loss of hearing. Does not insert objects into ear .  The ear bled bled one mor time 5 days ago   " I want to get off of all medications if I can."  Her grieving has improved .  Reviewed meds:  cymbalta and wellbutrin   Outpatient Medications Prior to Visit  Medication Sig Dispense Refill   ALPRAZolam (XANAX) 0.25 MG tablet TAKE 1 TABLET BY MOUTH AT BEDTIME AS NEEDED FOR ANXIETY 30  tablet 1   amLODipine (NORVASC) 5 MG tablet Take 1 tablet (5 mg total) by mouth daily. 90 tablet 3   aspirin 81 MG tablet Take 81 mg by mouth daily.     atorvastatin (LIPITOR) 40 MG tablet Take 1 tablet (40 mg total) by mouth daily. 90 tablet 0   buPROPion (WELLBUTRIN XL) 300 MG 24 hr tablet Take 1 tablet (300 mg total) by mouth daily. 30 tablet 1   butalbital-acetaminophen-caffeine (FIORICET) 50-325-40 MG tablet TAKE 1 TABLET BY MOUTH EVERY 12 HOURS AS NEEDED FOR HEADACHE 60 tablet 0   Cholecalciferol (VITAMIN D3) 1000 UNITS CAPS Take by mouth.     cyanocobalamin (,VITAMIN B-12,) 1000 MCG/ML injection INJECT 1ML INTO THE MUSCLE EVERY 30 DAYS 10 mL 0   DULoxetine (CYMBALTA) 20 MG capsule Take 1 capsule by mouth once daily 90 capsule 0   furosemide (LASIX) 20 MG tablet Take 1 tablet by mouth once daily 90 tablet 0   glucose blood test strip Use to check blood sugar once daily. 100 each 5   metoprolol succinate (TOPROL-XL) 100 MG 24 hr tablet TAKE 1 TABLET BY MOUTH AT BEDTIME (TAKE  WITH  OR  IMMEDIATELY  FOLLOWING  A  MEAL) 90 tablet 0   nitroGLYCERIN (NITROSTAT) 0.4 MG SL tablet Place 1 tablet (0.4 mg total) under the tongue every 5 (five) minutes as needed for chest pain. 50 tablet 3   Omega-3 Fatty Acids (FISH OIL) 1000 MG CAPS Take 1 capsule daily by mouth.  OneTouch Delica Lancets 37J MISC Use to check blood sugar once daily. 100 each 5   oxybutynin (DITROPAN-XL) 5 MG 24 hr tablet Take 1 tablet (5 mg total) by mouth at bedtime. 90 tablet 0   Syringe/Needle, Disp, (SYRINGE 3CC/25GX1") 25G X 1" 3 ML MISC Use for b12 injections 50 each 0   telmisartan (MICARDIS) 80 MG tablet Take 1 tablet by mouth once daily 90 tablet 0   tiZANidine (ZANAFLEX) 4 MG tablet Take 1 tablet (4 mg total) by mouth at bedtime as needed for muscle spasms. 30 tablet 0   traMADol (ULTRAM) 50 MG tablet Take 1 tablet (50 mg total) by mouth every 8 (eight) hours as needed. 90 tablet 4   traZODone (DESYREL) 50 MG tablet  Take 1 tablet (50 mg total) by mouth at bedtime. 90 tablet 1   triamcinolone cream (KENALOG) 0.1 % Apply 1 application topically 2 (two) times daily. 30 g 0   valACYclovir (VALTREX) 1000 MG tablet Take 1 tablet (1,000 mg total) by mouth 3 (three) times daily. With food 30 tablet 0   warfarin (COUMADIN) 5 MG tablet Take 1 tablet (5 mg total) by mouth daily. Alternating with 6 mg dose. 30 tablet 3   warfarin (COUMADIN) 6 MG tablet Take 1 tablet (6 mg total) by mouth daily. 90 tablet 0   omeprazole (PRILOSEC) 20 MG capsule Take 1 capsule (20 mg total) by mouth 2 (two) times daily before a meal. 60 capsule 2   doxycycline (VIBRA-TABS) 100 MG tablet Take 1 tablet (100 mg total) by mouth 2 (two) times daily. (Patient not taking: Reported on 06/28/2020) 20 tablet 0   predniSONE (DELTASONE) 10 MG tablet 6 tablets on Day 1 , then reduce by 1 tablet daily until gone (Patient not taking: Reported on 06/28/2020) 21 tablet 0   Facility-Administered Medications Prior to Visit  Medication Dose Route Frequency Provider Last Rate Last Admin   cyanocobalamin ((VITAMIN B-12)) injection 1,000 mcg  1,000 mcg Intramuscular Once Crecencio Mc, MD        Review of Systems;  Patient denies headache, fevers, malaise, unintentional weight loss, skin rash, eye pain, sinus congestion and sinus pain, sore throat, dysphagia,  hemoptysis , cough, dyspnea, wheezing, chest pain, palpitations, orthopnea, edema, abdominal pain, nausea, melena, diarrhea, constipation, flank pain, dysuria, hematuria, urinary  Frequency, nocturia, numbness, tingling, seizures,  Focal weakness, Loss of consciousness,  Tremor, insomnia, depression, anxiety, and suicidal ideation.      Objective:  BP (!) 144/76 (BP Location: Left Arm, Patient Position: Sitting, Cuff Size: Large)   Pulse 71   Temp (!) 95.7 F (35.4 C) (Temporal)   Resp 15   Ht 5\' 2"  (1.575 m)   Wt 166 lb 12.8 oz (75.7 kg)   SpO2 96%   BMI 30.51 kg/m   BP Readings from Last 3  Encounters:  06/28/20 (!) 144/76  06/21/20 132/70  05/25/20 (!) 137/94    Wt Readings from Last 3 Encounters:  06/28/20 166 lb 12.8 oz (75.7 kg)  06/21/20 167 lb (75.8 kg)  05/25/20 168 lb (76.2 kg)    General appearance: alert, cooperative and appears stated age Ears: dried blood abutting an intact ear canal on the right. normal TM's and normal left external ear canal Throat: lips, mucosa, and tongue normal; teeth and gums normal Neck: no adenopathy, no carotid bruit, supple, symmetrical, trachea midline and thyroid not enlarged, symmetric, no tenderness/mass/nodules Back: symmetric, no curvature. ROM normal. No CVA tenderness. Lungs: clear to auscultation bilaterally  Heart: regular rate and rhythm, S1, S2 normal, no murmur, click, rub or gallop Abdomen: soft, non-tender; bowel sounds normal; no masses,  no organomegaly Pulses: 2+ and symmetric Skin: Skin color, texture, turgor normal. No rashes or lesions Lymph nodes: Cervical, supraclavicular, and axillary nodes normal.  Lab Results  Component Value Date   HGBA1C 6.3 (H) 05/14/2020   HGBA1C 6.2 01/13/2020   HGBA1C 6.3 (H) 08/08/2019    Lab Results  Component Value Date   CREATININE 1.13 06/28/2020   CREATININE 0.97 05/14/2020   CREATININE 1.05 01/13/2020    Lab Results  Component Value Date   WBC 5.6 06/21/2020   HGB 14.4 06/21/2020   HCT 44.3 06/21/2020   PLT 163 06/21/2020   GLUCOSE 72 06/28/2020   CHOL 162 01/13/2020   TRIG 132.0 01/13/2020   HDL 48.60 01/13/2020   LDLDIRECT 111.0 08/13/2016   LDLCALC 87 01/13/2020   ALT 19 06/28/2020   AST 13 06/28/2020   NA 144 06/28/2020   K 3.9 06/28/2020   CL 102 06/28/2020   CREATININE 1.13 06/28/2020   BUN 23 06/28/2020   CO2 34 (H) 06/28/2020   TSH 2.62 11/18/2016   INR 3.3 (H) 06/28/2020   HGBA1C 6.3 (H) 05/14/2020   MICROALBUR 2.2 (H) 11/13/2017    CT Head Wo Contrast  Result Date: 05/25/2020 CLINICAL DATA:  Headache EXAM: CT HEAD WITHOUT CONTRAST  TECHNIQUE: Contiguous axial images were obtained from the base of the skull through the vertex without intravenous contrast. COMPARISON:  04/02/2017 FINDINGS: Brain: No evidence of acute infarction, hemorrhage, hydrocephalus, extra-axial collection or mass lesion/mass effect. Scattered low-density changes within the periventricular and subcortical white matter compatible with chronic microvascular ischemic change. Vascular: Atherosclerotic calcifications involving the large vessels of the skull base. No unexpected hyperdense vessel. Skull: Normal. Negative for fracture or focal lesion. Sinuses/Orbits: No acute finding. Other: None. IMPRESSION: No acute intracranial findings. Electronically Signed   By: Davina Poke D.O.   On: 05/25/2020 13:37    Assessment & Plan:   Problem List Items Addressed This Visit       Unprioritized   Controlled type 2 diabetes mellitus with microalbuminuria, without long-term current use of insulin ([place])    Starting Ozempic for management of obesity and type 2 DM.  Patient's first dose was given in office       Relevant Medications   Semaglutide,0.25 or 0.5MG /DOS, (OZEMPIC, 0.25 OR 0.5 MG/DOSE,) 2 MG/1.5ML SOPN   Other Relevant Orders   Comprehensive metabolic panel (Completed)   Facial edema    Concurrent with rash on chest wall and arms. No signs of angioedema or anaphylaxis . Now resolved.  Recommend continued use of twice daily nonsedating anthistamin and famotidine 20 mg  bid since the etiology is not clear.   Lab Results  Component Value Date   WBC 5.6 06/21/2020   HGB 14.4 06/21/2020   HCT 44.3 06/21/2020   MCV 86 06/21/2020   PLT 163 06/21/2020          Grief reaction with prolonged bereavement    She feels she is ready to stop all antidepressants.  Taper advised cymbalta; advised to continue wellbutrin for now       Long term current use of anticoagulant therapy - Primary   Relevant Orders   Protime-INR (Completed)   Morbid obesity  ([place])    With diabetes and hypertension.  Starting Ozempic .  Lifestyle changes discussed        Relevant Medications   Semaglutide,0.25 or  0.5MG /DOS, (OZEMPIC, 0.25 OR 0.5 MG/DOSE,) 2 MG/1.5ML SOPN    I have discontinued [facility]eker's omeprazole, doxycycline, and predniSONE. I am also having her start on Ozempic (0.25 or 0.5 MG/DOSE) and mometasone. Additionally, I am having her maintain her Vitamin D3, aspirin, traMADol, Fish Oil, glucose blood, OneTouch Delica Lancets 74B, nitroGLYCERIN, traZODone, DULoxetine, furosemide, ALPRAZolam, cyanocobalamin, atorvastatin, warfarin, oxybutynin, buPROPion, triamcinolone cream, warfarin, butalbital-acetaminophen-caffeine, telmisartan, metoprolol succinate, valACYclovir, amLODipine, tiZANidine, and SYRINGE 3CC/25GX1". We will continue to administer cyanocobalamin.  Meds ordered this encounter  Medications   Semaglutide,0.25 or 0.5MG /DOS, (OZEMPIC, 0.25 OR 0.5 MG/DOSE,) 2 MG/1.5ML SOPN    Sig: Inject 0.25 mg into the skin once a week.    Dispense:  1.5 mL    Refill:  2   mometasone (ELOCON) 0.1 % cream    Sig: Apply topically daily. to ear canal as needed for itching    Dispense:  15 g    Refill:  1    Medications Discontinued During This Encounter  Medication Reason   predniSONE (DELTASONE) 10 MG tablet    omeprazole (PRILOSEC) 20 MG capsule    doxycycline (VIBRA-TABS) 100 MG tablet      I spent 30 mintutes dedicated to the care of this patient on the date of this encounter to include pre-visit review of his medical history,  Face-to-face time with the patient , and post visit ordering of testing and therapeutics.  Follow-up: Return in about 2 months (around 08/28/2020) for follow up diabetes.   Crecencio Mc, MD

## 2020-06-28 NOTE — Patient Instructions (Addendum)
Continue wellbutrin for now at 300 mg daily   Reduce  the cymbalta to  other day for one week  , then stop it completely  The next step would be reducing  wellbutrin  to 150 mg daily   For the hives and GERD:  Continue famotidine 20 gm twice daily and stop the omeprazole   Continue zyrtec once or twice daily    We are starting a weekly injection called "Ozempic" for diabetes and weight management.  It works by [place] down your gastric emptying,  so it reduces your appetite   Plan to return for labs on or After August 9 and diabetes follow up the next week

## 2020-07-01 ENCOUNTER — Encounter: Payer: Self-pay | Admitting: Internal Medicine

## 2020-07-01 DIAGNOSIS — F4381 Prolonged grief disorder: Secondary | ICD-10-CM | POA: Insufficient documentation

## 2020-07-01 NOTE — Assessment & Plan Note (Signed)
Concurrent with rash on chest wall and arms. No signs of angioedema or anaphylaxis . Now resolved.  Recommend continued use of twice daily nonsedating anthistamin and famotidine 20 mg  bid since the etiology is not clear.   Lab Results  Component Value Date   WBC 5.6 06/21/2020   HGB 14.4 06/21/2020   HCT 44.3 06/21/2020   MCV 86 06/21/2020   PLT 163 06/21/2020

## 2020-07-01 NOTE — Assessment & Plan Note (Signed)
Starting Ozempic for management of obesity and type 2 DM.  Patient's first dose was given in office

## 2020-07-01 NOTE — Assessment & Plan Note (Addendum)
With diabetes and hypertension.  Starting Ozempic .  Lifestyle changes discussed

## 2020-07-01 NOTE — Assessment & Plan Note (Addendum)
She feels she is ready to stop all antidepressants.  Taper advised cymbalta; advised to continue wellbutrin for now

## 2020-07-03 MED ORDER — BUPROPION HCL ER (XL) 150 MG PO TB24: 150.0000 mg | ORAL_TABLET | Freq: Every day | ORAL | 2 refills | Status: DC

## 2020-07-03 NOTE — Addendum Note (Signed)
Addended by: Crecencio Mc on: 07/03/2020 10:25 AM   Modules accepted: Orders

## 2020-07-11 ENCOUNTER — Other Ambulatory Visit (INDEPENDENT_AMBULATORY_CARE_PROVIDER_SITE_OTHER): Payer: PPO

## 2020-07-11 ENCOUNTER — Other Ambulatory Visit: Payer: Self-pay

## 2020-07-11 DIAGNOSIS — Z7901 Long term (current) use of anticoagulants: Secondary | ICD-10-CM | POA: Diagnosis not present

## 2020-07-11 DIAGNOSIS — R809 Proteinuria, unspecified: Secondary | ICD-10-CM

## 2020-07-11 DIAGNOSIS — E1129 Type 2 diabetes mellitus with other diabetic kidney complication: Secondary | ICD-10-CM | POA: Diagnosis not present

## 2020-07-11 LAB — PROTIME-INR
INR: 2.6 ratio — ABNORMAL HIGH (ref 0.8–1.0)
Prothrombin Time: 28.8 s — ABNORMAL HIGH (ref 9.6–13.1)

## 2020-07-11 LAB — RENAL FUNCTION PANEL
Albumin: 4.2 g/dL (ref 3.5–5.2)
BUN: 14 mg/dL (ref 6–23)
CO2: 28 mEq/L (ref 19–32)
Calcium: 9 mg/dL (ref 8.4–10.5)
Chloride: 105 mEq/L (ref 96–112)
Creatinine, Ser: 1.03 mg/dL (ref 0.40–1.20)
GFR: 54.79 mL/min — ABNORMAL LOW (ref >60.00)
Glucose, Bld: 94 mg/dL (ref 70–99)
Phosphorus: 3.7 mg/dL (ref 2.3–4.6)
Potassium: 3.7 mEq/L (ref 3.5–5.1)
Sodium: 141 mEq/L (ref 135–145)

## 2020-07-16 ENCOUNTER — Other Ambulatory Visit: Payer: Self-pay | Admitting: Internal Medicine

## 2020-07-25 DIAGNOSIS — W57XXXA Bitten or stung by nonvenomous insect and other nonvenomous arthropods, initial encounter: Secondary | ICD-10-CM | POA: Diagnosis not present

## 2020-07-25 DIAGNOSIS — S60562A Insect bite (nonvenomous) of left hand, initial encounter: Secondary | ICD-10-CM | POA: Diagnosis not present

## 2020-07-25 DIAGNOSIS — L089 Local infection of the skin and subcutaneous tissue, unspecified: Secondary | ICD-10-CM | POA: Diagnosis not present

## 2020-07-25 DIAGNOSIS — Z7901 Long term (current) use of anticoagulants: Secondary | ICD-10-CM | POA: Diagnosis not present

## 2020-08-01 DIAGNOSIS — R238 Other skin changes: Secondary | ICD-10-CM | POA: Diagnosis not present

## 2020-08-08 DIAGNOSIS — R21 Rash and other nonspecific skin eruption: Secondary | ICD-10-CM | POA: Diagnosis not present

## 2020-08-09 DIAGNOSIS — E119 Type 2 diabetes mellitus without complications: Secondary | ICD-10-CM | POA: Diagnosis not present

## 2020-08-09 LAB — HM DIABETES EYE EXAM

## 2020-08-10 ENCOUNTER — Other Ambulatory Visit: Payer: Self-pay | Admitting: Internal Medicine

## 2020-08-10 DIAGNOSIS — E785 Hyperlipidemia, unspecified: Secondary | ICD-10-CM

## 2020-08-10 DIAGNOSIS — E1169 Type 2 diabetes mellitus with other specified complication: Secondary | ICD-10-CM

## 2020-08-13 ENCOUNTER — Other Ambulatory Visit: Payer: Self-pay | Admitting: Internal Medicine

## 2020-08-13 ENCOUNTER — Other Ambulatory Visit: Payer: Self-pay

## 2020-08-13 DIAGNOSIS — Z7901 Long term (current) use of anticoagulants: Secondary | ICD-10-CM

## 2020-08-13 DIAGNOSIS — E1169 Type 2 diabetes mellitus with other specified complication: Secondary | ICD-10-CM

## 2020-08-13 MED ORDER — WARFARIN SODIUM 6 MG PO TABS: 6.0000 mg | ORAL_TABLET | Freq: Every day | ORAL | 0 refills | Status: DC

## 2020-08-20 ENCOUNTER — Telehealth: Payer: Self-pay | Admitting: *Deleted

## 2020-08-20 NOTE — Telephone Encounter (Signed)
Please place future orders for lab appt.  

## 2020-08-21 ENCOUNTER — Other Ambulatory Visit: Payer: Self-pay | Admitting: Internal Medicine

## 2020-08-21 DIAGNOSIS — Z7901 Long term (current) use of anticoagulants: Secondary | ICD-10-CM

## 2020-08-21 DIAGNOSIS — R944 Abnormal results of kidney function studies: Secondary | ICD-10-CM

## 2020-08-21 DIAGNOSIS — I700 Atherosclerosis of aorta: Secondary | ICD-10-CM

## 2020-08-22 ENCOUNTER — Other Ambulatory Visit: Payer: Self-pay

## 2020-08-22 ENCOUNTER — Other Ambulatory Visit (INDEPENDENT_AMBULATORY_CARE_PROVIDER_SITE_OTHER): Payer: PPO

## 2020-08-22 DIAGNOSIS — R944 Abnormal results of kidney function studies: Secondary | ICD-10-CM | POA: Diagnosis not present

## 2020-08-22 DIAGNOSIS — Z7901 Long term (current) use of anticoagulants: Secondary | ICD-10-CM | POA: Diagnosis not present

## 2020-08-22 LAB — BASIC METABOLIC PANEL
BUN: 10 mg/dL (ref 6–23)
CO2: 28 mEq/L (ref 19–32)
Calcium: 8.8 mg/dL (ref 8.4–10.5)
Chloride: 105 mEq/L (ref 96–112)
Creatinine, Ser: 0.96 mg/dL (ref 0.40–1.20)
GFR: 59.57 mL/min — ABNORMAL LOW (ref >60.00)
Glucose, Bld: 103 mg/dL — ABNORMAL HIGH (ref 70–99)
Potassium: 3.9 mEq/L (ref 3.5–5.1)
Sodium: 141 mEq/L (ref 135–145)

## 2020-08-22 LAB — PROTIME-INR
INR: 3 ratio — ABNORMAL HIGH (ref 0.8–1.0)
Prothrombin Time: 30.7 s — ABNORMAL HIGH (ref 9.6–13.1)

## 2020-08-23 DIAGNOSIS — H26492 Other secondary cataract, left eye: Secondary | ICD-10-CM | POA: Diagnosis not present

## 2020-08-24 DIAGNOSIS — R944 Abnormal results of kidney function studies: Secondary | ICD-10-CM

## 2020-08-27 ENCOUNTER — Other Ambulatory Visit: Payer: Self-pay | Admitting: Internal Medicine

## 2020-09-04 ENCOUNTER — Encounter: Payer: Self-pay | Admitting: Internal Medicine

## 2020-09-04 ENCOUNTER — Other Ambulatory Visit: Payer: Self-pay

## 2020-09-04 ENCOUNTER — Ambulatory Visit (INDEPENDENT_AMBULATORY_CARE_PROVIDER_SITE_OTHER): Payer: PPO

## 2020-09-04 ENCOUNTER — Ambulatory Visit (INDEPENDENT_AMBULATORY_CARE_PROVIDER_SITE_OTHER): Payer: PPO | Admitting: Internal Medicine

## 2020-09-04 VITALS — BP 114/78 | HR 62 | Temp 96.4°F | Resp 16 | Ht 62.0 in | Wt 162.0 lb

## 2020-09-04 DIAGNOSIS — K921 Melena: Secondary | ICD-10-CM

## 2020-09-04 DIAGNOSIS — R0600 Dyspnea, unspecified: Secondary | ICD-10-CM | POA: Diagnosis not present

## 2020-09-04 DIAGNOSIS — R112 Nausea with vomiting, unspecified: Secondary | ICD-10-CM | POA: Diagnosis not present

## 2020-09-04 DIAGNOSIS — R809 Proteinuria, unspecified: Secondary | ICD-10-CM | POA: Diagnosis not present

## 2020-09-04 DIAGNOSIS — R1013 Epigastric pain: Secondary | ICD-10-CM

## 2020-09-04 DIAGNOSIS — E1129 Type 2 diabetes mellitus with other diabetic kidney complication: Secondary | ICD-10-CM

## 2020-09-04 DIAGNOSIS — R0609 Other forms of dyspnea: Secondary | ICD-10-CM

## 2020-09-04 DIAGNOSIS — R110 Nausea: Secondary | ICD-10-CM | POA: Diagnosis not present

## 2020-09-04 LAB — POC HEMOCCULT BLD/STL (OFFICE/1-CARD/DIAGNOSTIC): Card #1 Date: NEGATIVE

## 2020-09-04 NOTE — Progress Notes (Signed)
Subjective:  Patient ID: [name], female    DOB: [birth year]  Age: 70 y.o. MRN: [id number]  CC: The primary encounter diagnosis was Epigastric pain. Diagnoses of Non-intractable vomiting with nausea, unspecified vomiting type, Controlled type 2 diabetes mellitus with microalbuminuria, without long-term current use of insulin ([place]), Exertional dyspnea, Black stool, and Morbid obesity ([place]) were also pertinent to this visit.  HPI [name] presents for  Chief Complaint  Patient presents with   Follow-up    Diabetes, hypertension, hyperlipidemia     T2DM:  SHE HAS NOT tolerated ozempic due to persistent bloating,  gas, nausea,  and uncontrolled diarrhea that lasts for  3 days after each dose.   Appetite is  poor, but has only lost 4 lb In the last 6 weeks. Her solid stools are not overly hard,  no straining,  but dark in color.    Outpatient Medications Prior to Visit  Medication Sig Dispense Refill   ALPRAZolam (XANAX) 0.25 MG tablet TAKE 1 TABLET BY MOUTH AT BEDTIME AS NEEDED FOR ANXIETY 30 tablet 1   amLODipine (NORVASC) 5 MG tablet Take 1 tablet (5 mg total) by mouth daily. 90 tablet 3   aspirin 81 MG tablet Take 81 mg by mouth daily.     atorvastatin (LIPITOR) 40 MG tablet Take 1 tablet by mouth once daily 90 tablet 0   buPROPion (WELLBUTRIN XL) 150 MG 24 hr tablet Take 1 tablet (150 mg total) by mouth daily. 30 tablet 2   butalbital-acetaminophen-caffeine (FIORICET) 50-325-40 MG tablet TAKE 1 TABLET BY MOUTH EVERY 12 HOURS AS NEEDED FOR HEADACHE 60 tablet 0   Cholecalciferol (VITAMIN D3) 1000 UNITS CAPS Take by mouth.     cyanocobalamin (,VITAMIN B-12,) 1000 MCG/ML injection INJECT 1ML INTO THE MUSCLE EVERY 30 DAYS 10 mL 0   glucose blood test strip Use to check blood sugar once daily. 100 each 5   metoprolol succinate (TOPROL-XL) 100 MG 24 hr tablet TAKE 1 TABLET BY MOUTH AT BEDTIME TAKE  WITH  OR  IMEDIATELY  FOLLOWING  A  MEAL 90 tablet 0   mometasone (ELOCON) 0.1 % cream  Apply topically daily. to ear canal as needed for itching 15 g 1   nitroGLYCERIN (NITROSTAT) 0.4 MG SL tablet Place 1 tablet (0.4 mg total) under the tongue every 5 (five) minutes as needed for chest pain. 50 tablet 3   Omega-3 Fatty Acids (FISH OIL) 1000 MG CAPS Take 1 capsule daily by mouth.     OneTouch Delica Lancets 99991111 MISC Use to check blood sugar once daily. 100 each 5   oxybutynin (DITROPAN-XL) 5 MG 24 hr tablet Take 1 tablet (5 mg total) by mouth at bedtime. 90 tablet 0   Semaglutide,0.25 or 0.'5MG'$ /DOS, (OZEMPIC, 0.25 OR 0.5 MG/DOSE,) 2 MG/1.5ML SOPN Inject 0.25 mg into the skin once a week. 1.5 mL 2   Syringe/Needle, Disp, (SYRINGE 3CC/25GX1") 25G X 1" 3 ML MISC Use for b12 injections 50 each 0   telmisartan (MICARDIS) 80 MG tablet Take 1 tablet by mouth once daily 90 tablet 0   tiZANidine (ZANAFLEX) 4 MG tablet Take 1 tablet (4 mg total) by mouth at bedtime as needed for muscle spasms. 30 tablet 0   traMADol (ULTRAM) 50 MG tablet Take 1 tablet (50 mg total) by mouth every 8 (eight) hours as needed. 90 tablet 4   traZODone (DESYREL) 50 MG tablet TAKE 1 TABLET BY MOUTH AT BEDTIME 90 tablet 0   triamcinolone cream (KENALOG)  0.1 % Apply 1 application topically 2 (two) times daily. 30 g 0   warfarin (COUMADIN) 5 MG tablet Take 1 tablet (5 mg total) by mouth daily. Alternating with 6 mg dose. 30 tablet 3   warfarin (COUMADIN) 6 MG tablet Take 1 tablet (6 mg total) by mouth daily. 90 tablet 0   DULoxetine (CYMBALTA) 20 MG capsule Take 1 capsule by mouth once daily (Patient not taking: Reported on 09/04/2020) 90 capsule 0   furosemide (LASIX) 20 MG tablet Take 1 tablet by mouth once daily (Patient not taking: Reported on 09/04/2020) 90 tablet 0   valACYclovir (VALTREX) 1000 MG tablet Take 1 tablet (1,000 mg total) by mouth 3 (three) times daily. With food 30 tablet 0   Facility-Administered Medications Prior to Visit  Medication Dose Route Frequency Provider Last Rate Last Admin   cyanocobalamin  ((VITAMIN B-12)) injection 1,000 mcg  1,000 mcg Intramuscular Once Crecencio Mc, MD        Review of Systems;  Patient denies headache, fevers, malaise, unintentional weight loss, skin rash, eye pain, sinus congestion and sinus pain, sore throat, dysphagia,  hemoptysis , cough, dyspnea, wheezing, chest pain, palpitations, orthopnea, edema, abdominal pain, nausea, melena, diarrhea, constipation, flank pain, dysuria, hematuria, urinary  Frequency, nocturia, numbness, tingling, seizures,  Focal weakness, Loss of consciousness,  Tremor, insomnia, depression, anxiety, and suicidal ideation.      Objective:  BP 114/78 (BP Location: Left Arm, Patient Position: Sitting, Cuff Size: Large)   Pulse 62   Temp (!) 96.4 F (35.8 C) (Temporal)   Resp 16   Ht '5\' 2"'$  (1.575 m)   Wt 162 lb (73.5 kg)   SpO2 97%   BMI 29.63 kg/m   BP Readings from Last 3 Encounters:  09/04/20 114/78  06/28/20 (!) 144/76  06/21/20 132/70    Wt Readings from Last 3 Encounters:  09/04/20 162 lb (73.5 kg)  06/28/20 166 lb 12.8 oz (75.7 kg)  06/21/20 167 lb (75.8 kg)    General appearance: alert, cooperative and appears stated age Ears: normal TM's and external ear canals both ears Throat: lips, mucosa, and tongue normal; teeth and gums normal Neck: no adenopathy, no carotid bruit, supple, symmetrical, trachea midline and thyroid not enlarged, symmetric, no tenderness/mass/nodules Back: symmetric, no curvature. ROM normal. No CVA tenderness. Lungs: clear to auscultation bilaterally Heart: regular rate and rhythm, S1, S2 normal, no murmur, click, rub or gallop Abdomen: soft, non-tender; bowel sounds normal; no masses,  no organomegaly Pulses: 2+ and symmetric Skin: Skin color, texture, turgor normal. No rashes or lesions Lymph nodes: Cervical, supraclavicular, and axillary nodes normal.  Lab Results  Component Value Date   HGBA1C 6.2 09/04/2020   HGBA1C 6.3 (H) 05/14/2020   HGBA1C 6.2 01/13/2020    Lab  Results  Component Value Date   CREATININE 0.96 09/04/2020   CREATININE 0.96 08/22/2020   CREATININE 1.03 07/11/2020    Lab Results  Component Value Date   WBC 4.6 09/04/2020   HGB 13.7 09/04/2020   HCT 41.8 09/04/2020   PLT 179.0 09/04/2020   GLUCOSE 100 (H) 09/04/2020   CHOL 162 01/13/2020   TRIG 132.0 01/13/2020   HDL 48.60 01/13/2020   LDLDIRECT 111.0 08/13/2016   LDLCALC 87 01/13/2020   ALT 16 09/04/2020   AST 19 09/04/2020   NA 141 09/04/2020   K 3.8 09/04/2020   CL 103 09/04/2020   CREATININE 0.96 09/04/2020   BUN 12 09/04/2020   CO2 29 09/04/2020   TSH 2.62  11/18/2016   INR 3.0 (H) 08/22/2020   HGBA1C 6.2 09/04/2020   MICROALBUR 2.2 (H) 11/13/2017    CT Head Wo Contrast   Assessment & Plan:   Problem List Items Addressed This Visit       Unprioritized   Morbid obesity ([place])    With  Type 2 DM and hypertension.  ozempic has failed to result in significant weight loss despite creating anorexia      Controlled type 2 diabetes mellitus with microalbuminuria, without long-term current use of insulin ([place])    Stopping Ozempic due to persistent side effects of nausea, bloating and diarrhea.       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  Liver enzymes are normal and plain films of abdomen  Suggest a large stool burden .  Will prescribe lactulose.       Relevant Orders   Comprehensive metabolic panel (Completed)   Black stool    FOBT done in office was negative for guaiac, and she is not anemic   Lab Results  Component Value Date   WBC 4.6 09/04/2020   HGB 13.7  09/04/2020   HCT 41.8 09/04/2020   MCV 88.5 09/04/2020   PLT 179.0 09/04/2020         Relevant Orders   POC Hemoccult Bld/Stl (1-Cd Office Dx) (Completed)   Exertional dyspnea   Relevant Orders   CBC with Differential/Platelet (Completed)   . I spent 30 minutes dedicated to the care of this patient on the date of this encounter to include pre-visit review of her medical history,  Face-to-face time with the patient , and post visit ordering of testing and therapeutics.   Meds ordered this encounter  Medications   lactulose (CHRONULAC) 10 GM/15ML solution    Sig: Take 30 mLs (20 g total) by mouth 2 (two) times daily as needed for moderate constipation.    Dispense:  236 mL    Refill:  0    Medications Discontinued During This Encounter  Medication Reason   valACYclovir (VALTREX) 1000 MG tablet     Follow-up: No follow-ups on file.   Crecencio Mc, MD

## 2020-09-04 NOTE — Patient Instructions (Signed)
/  stop the ozempic  If all symptoms do not resolve in 1 week, let me know  Labs will be reviewed tomorrow.  CT may be needed

## 2020-09-05 ENCOUNTER — Telehealth: Payer: Self-pay | Admitting: Internal Medicine

## 2020-09-05 DIAGNOSIS — R0600 Dyspnea, unspecified: Secondary | ICD-10-CM | POA: Insufficient documentation

## 2020-09-05 DIAGNOSIS — R1110 Vomiting, unspecified: Secondary | ICD-10-CM | POA: Insufficient documentation

## 2020-09-05 DIAGNOSIS — R0609 Other forms of dyspnea: Secondary | ICD-10-CM | POA: Insufficient documentation

## 2020-09-05 DIAGNOSIS — R1013 Epigastric pain: Secondary | ICD-10-CM | POA: Insufficient documentation

## 2020-09-05 DIAGNOSIS — K921 Melena: Secondary | ICD-10-CM | POA: Insufficient documentation

## 2020-09-05 LAB — COMPREHENSIVE METABOLIC PANEL
ALT: 16 U/L (ref 0–35)
AST: 19 U/L (ref 0–37)
Albumin: 4 g/dL (ref 3.5–5.2)
Alkaline Phosphatase: 73 U/L (ref 39–117)
BUN: 12 mg/dL (ref 6–23)
CO2: 29 mEq/L (ref 19–32)
Calcium: 8.9 mg/dL (ref 8.4–10.5)
Chloride: 103 mEq/L (ref 96–112)
Creatinine, Ser: 0.96 mg/dL (ref 0.40–1.20)
GFR: 59.56 mL/min — ABNORMAL LOW (ref >60.00)
Glucose, Bld: 100 mg/dL — ABNORMAL HIGH (ref 70–99)
Potassium: 3.8 mEq/L (ref 3.5–5.1)
Sodium: 141 mEq/L (ref 135–145)
Total Bilirubin: 0.6 mg/dL (ref 0.2–1.2)
Total Protein: 6 g/dL (ref 6.0–8.3)

## 2020-09-05 LAB — CBC WITH DIFFERENTIAL/PLATELET
Basophils Absolute: 0.1 10*3/uL (ref 0.0–0.1)
Basophils Relative: 1.5 % (ref 0.0–3.0)
Eosinophils Absolute: 0.2 10*3/uL (ref 0.0–0.7)
Eosinophils Relative: 4.2 % (ref 0.0–5.0)
HCT: 41.8 % (ref 36.0–46.0)
Hemoglobin: 13.7 g/dL (ref 12.0–15.0)
Lymphocytes Relative: 31.1 % (ref 12.0–46.0)
Lymphs Abs: 1.4 10*3/uL (ref 0.7–4.0)
MCHC: 32.7 g/dL (ref 30.0–36.0)
MCV: 88.5 fl (ref 78.0–100.0)
Monocytes Absolute: 0.5 10*3/uL (ref 0.1–1.0)
Monocytes Relative: 11.3 % (ref 3.0–12.0)
Neutro Abs: 2.4 10*3/uL (ref 1.4–7.7)
Neutrophils Relative %: 51.9 % (ref 43.0–77.0)
Platelets: 179 10*3/uL (ref 150.0–400.0)
RBC: 4.73 Mil/uL (ref 3.87–5.11)
RDW: 14.5 % (ref 11.5–15.5)
WBC: 4.6 10*3/uL (ref 4.0–10.5)

## 2020-09-05 LAB — HEMOGLOBIN A1C: Hgb A1c MFr Bld: 6.2 % (ref 4.6–6.5)

## 2020-09-05 LAB — LIPASE: Lipase: 19 U/L (ref 11.0–59.0)

## 2020-09-05 MED ORDER — LACTULOSE 10 GM/15ML PO SOLN: 20.0000 g | Freq: Two times a day (BID) | ORAL | 0 refills | Status: DC | PRN

## 2020-09-05 NOTE — Assessment & Plan Note (Signed)
With  Type 2 DM and hypertension.  ozempic has failed to result in significant weight loss despite creating anorexia

## 2020-09-05 NOTE — Assessment & Plan Note (Signed)
Pancreatitis is ruled out,  H pylori is pending  Severe constipation suggested by plain films  Lab Results  Component Value Date   LIPASE 19.0 09/04/2020

## 2020-09-05 NOTE — Assessment & Plan Note (Addendum)
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  Liver enzymes are normal and plain films of abdomen  Suggest a large stool burden .  Will prescribe lactulose.

## 2020-09-05 NOTE — Assessment & Plan Note (Signed)
Stopping Ozempic due to persistent side effects of nausea, bloating and diarrhea.

## 2020-09-05 NOTE — Assessment & Plan Note (Signed)
FOBT done in office was negative for guaiac, and she is not anemic   Lab Results  Component Value Date   WBC 4.6 09/04/2020   HGB 13.7 09/04/2020   HCT 41.8 09/04/2020   MCV 88.5 09/04/2020   PLT 179.0 09/04/2020

## 2020-09-05 NOTE — Telephone Encounter (Signed)
Results sent via my chart 

## 2020-09-06 LAB — H PYLORI, IGM, IGG, IGA AB
H pylori, IgM Abs: <9 units (ref 0.0–8.9)
H. pylori, IgA Abs: <9 units (ref 0.0–8.9)
H. pylori, IgG AbS: 0.33 Index Value (ref 0.00–0.79)

## 2020-10-10 ENCOUNTER — Telehealth: Payer: Self-pay | Admitting: Internal Medicine

## 2020-10-10 DIAGNOSIS — E1129 Type 2 diabetes mellitus with other diabetic kidney complication: Secondary | ICD-10-CM

## 2020-10-10 DIAGNOSIS — R809 Proteinuria, unspecified: Secondary | ICD-10-CM

## 2020-10-10 DIAGNOSIS — Z7901 Long term (current) use of anticoagulants: Secondary | ICD-10-CM

## 2020-10-10 NOTE — Telephone Encounter (Signed)
Patient called to make a lab appointment. No orders are in chart, does patient need labs?

## 2020-10-11 NOTE — Telephone Encounter (Signed)
Monthly iNRs are already ordered as a standing order

## 2020-10-11 NOTE — Telephone Encounter (Signed)
LMTCB. Need to schedule pt for a fasting lab appt but not before Dec 1st.

## 2020-10-11 NOTE — Telephone Encounter (Signed)
I have asked [name]  in the past to order a standing INR bc I don't know how to do it.  So  when I went to order it, it appears it has been ordered.  She is overdue

## 2020-10-11 NOTE — Telephone Encounter (Signed)
Doesn't look like pt is due for labs until the end of November.

## 2020-10-11 NOTE — Addendum Note (Signed)
Addended by: Crecencio Mc on: 10/11/2020 02:55 PM   Modules accepted: Orders

## 2020-10-11 NOTE — Telephone Encounter (Signed)
Patient calling back in and states that she needs an INR checked monthly. States she also needed an EGFR

## 2020-10-11 NOTE — Telephone Encounter (Signed)
Labs ordered.  Do not schedule before Dec 1

## 2020-10-11 NOTE — Telephone Encounter (Signed)
Is this okay to order?  

## 2020-10-12 NOTE — Telephone Encounter (Signed)
Spoke with pt and scheduled her for a lab appt on Monday.

## 2020-10-15 ENCOUNTER — Other Ambulatory Visit: Payer: Self-pay

## 2020-10-15 ENCOUNTER — Other Ambulatory Visit (INDEPENDENT_AMBULATORY_CARE_PROVIDER_SITE_OTHER): Payer: PPO

## 2020-10-15 DIAGNOSIS — R944 Abnormal results of kidney function studies: Secondary | ICD-10-CM

## 2020-10-15 DIAGNOSIS — Z7901 Long term (current) use of anticoagulants: Secondary | ICD-10-CM | POA: Diagnosis not present

## 2020-10-15 LAB — BASIC METABOLIC PANEL
BUN: 9 mg/dL (ref 6–23)
CO2: 27 mEq/L (ref 19–32)
Calcium: 8.9 mg/dL (ref 8.4–10.5)
Chloride: 104 mEq/L (ref 96–112)
Creatinine, Ser: 0.93 mg/dL (ref 0.40–1.20)
GFR: 61.82 mL/min (ref >60.00)
Glucose, Bld: 121 mg/dL — ABNORMAL HIGH (ref 70–99)
Potassium: 3.7 mEq/L (ref 3.5–5.1)
Sodium: 140 mEq/L (ref 135–145)

## 2020-10-15 LAB — PROTIME-INR
INR: 2.6 ratio — ABNORMAL HIGH (ref 0.8–1.0)
Prothrombin Time: 27.3 s — ABNORMAL HIGH (ref 9.6–13.1)

## 2020-10-16 ENCOUNTER — Other Ambulatory Visit: Payer: Self-pay | Admitting: Internal Medicine

## 2020-10-16 DIAGNOSIS — Z03818 Encounter for observation for suspected exposure to other biological agents ruled out: Secondary | ICD-10-CM | POA: Diagnosis not present

## 2020-10-16 DIAGNOSIS — J019 Acute sinusitis, unspecified: Secondary | ICD-10-CM | POA: Diagnosis not present

## 2020-10-16 DIAGNOSIS — R059 Cough, unspecified: Secondary | ICD-10-CM | POA: Diagnosis not present

## 2020-11-08 DIAGNOSIS — H04221 Epiphora due to insufficient drainage, right lacrimal gland: Secondary | ICD-10-CM | POA: Diagnosis not present

## 2020-11-09 ENCOUNTER — Other Ambulatory Visit: Payer: Self-pay | Admitting: Internal Medicine

## 2020-11-09 DIAGNOSIS — E1169 Type 2 diabetes mellitus with other specified complication: Secondary | ICD-10-CM

## 2020-11-09 DIAGNOSIS — E785 Hyperlipidemia, unspecified: Secondary | ICD-10-CM

## 2020-11-09 DIAGNOSIS — N3281 Overactive bladder: Secondary | ICD-10-CM

## 2020-11-15 ENCOUNTER — Encounter: Payer: Self-pay | Admitting: Internal Medicine

## 2020-11-15 ENCOUNTER — Other Ambulatory Visit: Payer: Self-pay

## 2020-11-15 ENCOUNTER — Ambulatory Visit (INDEPENDENT_AMBULATORY_CARE_PROVIDER_SITE_OTHER): Payer: PPO | Admitting: Internal Medicine

## 2020-11-15 VITALS — BP 140/76 | HR 59 | Temp 96.1°F | Ht 62.0 in | Wt 163.2 lb

## 2020-11-15 DIAGNOSIS — Z7901 Long term (current) use of anticoagulants: Secondary | ICD-10-CM | POA: Diagnosis not present

## 2020-11-15 DIAGNOSIS — E1129 Type 2 diabetes mellitus with other diabetic kidney complication: Secondary | ICD-10-CM

## 2020-11-15 DIAGNOSIS — R809 Proteinuria, unspecified: Secondary | ICD-10-CM | POA: Diagnosis not present

## 2020-11-15 DIAGNOSIS — F4321 Adjustment disorder with depressed mood: Secondary | ICD-10-CM | POA: Diagnosis not present

## 2020-11-15 DIAGNOSIS — E663 Overweight: Secondary | ICD-10-CM

## 2020-11-15 LAB — PROTIME-INR
INR: 1.7 ratio — ABNORMAL HIGH (ref 0.8–1.0)
Prothrombin Time: 18.5 s — ABNORMAL HIGH (ref 9.6–13.1)

## 2020-11-15 MED ORDER — TOLTERODINE TARTRATE ER 2 MG PO CP24: 2.0000 mg | ORAL_CAPSULE | Freq: Every day | ORAL | 1 refills | Status: DC

## 2020-11-15 NOTE — Assessment & Plan Note (Signed)
With  Type 2 DM and hypertension.  ozempic  failed to result in significant weight loss despite creating anorexia due to severe obstipation and was stopped . Diabetes I currently controlled with diet.  Repeat a1c in 3 months   Lab Results  Component Value Date   HGBA1C 6.2 09/04/2020   Lab Results  Component Value Date   LABMICR 26.1 05/14/2020   LABMICR See below: 02/15/2020   MICROALBUR 2.2 (H) 11/13/2017   MICROALBUR 0.3 11/21/2016     Lab Results  Component Value Date   CHOL 162 01/13/2020   HDL 48.60 01/13/2020   LDLCALC 87 01/13/2020   LDLDIRECT 111.0 08/13/2016   TRIG 132.0 01/13/2020   CHOLHDL 3 01/13/2020

## 2020-11-15 NOTE — Progress Notes (Signed)
Subjective:  Patient ID: [name], female    DOB: 1950-09-15  Age: 70 y.o. MRN: [id number]  CC: The primary encounter diagnosis was Long term current use of anticoagulant therapy. Diagnoses of Controlled type 2 diabetes mellitus with microalbuminuria, without long-term current use of insulin ([place]), Overweight (BMI 25.0-29.9), and Grief were also pertinent to this visit.  HPI BRIANDA BEITLER presents for  Chief Complaint  Patient presents with   Follow-up    6 month follow up on diabetes, hypertension, hyperlipidemia   This visit occurred during the SARS-CoV-2 public health emergency.  Safety protocols were in place, including screening questions prior to the visit, additional usage of staff PPE, and extensive cleaning of exam room while observing appropriate contact time as indicated for disinfecting solutions.    T2DM:  She  feels generally well,  But is not  exercising regularly or trying to lose weight. Checking  blood sugars less than once daily at variable times, usually only if she feels she may be having a hypoglycemic event. .  BS have been under 130 fasting and < 150 post prandially.  Denies any recent hypoglyemic events.  Taking   medications as directed. Following a carbohydrate modified diet 6 days per week. Denies numbness, burning and tingling of extremities. Appetite is good.    Hypertension: patient checks blood pressure twice weekly at home.  Readings have been for the most part < 140/80 at rest . Patient is following a reduce salt diet most days and is taking medications as prescribed (telmisartan , metrprolol and amlodipine)  Grief: she continues to feel completely lost wand purposeless without her husband .  She goes out to lunch every 2 weeks with her girlfriends but otherwise does very little outside of the home .  30 minutes was spent in counselling patient today on her grief/   Occasional Fecal and urinary incontinence.  Taking Detrol LA without change. Does not want  to see Urology or GI.    Outpatient Medications Prior to Visit  Medication Sig Dispense Refill   ALPRAZolam (XANAX) 0.25 MG tablet TAKE 1 TABLET BY MOUTH AT BEDTIME AS NEEDED FOR ANXIETY 30 tablet 1   amLODipine (NORVASC) 5 MG tablet Take 1 tablet (5 mg total) by mouth daily. 90 tablet 3   aspirin 81 MG tablet Take 81 mg by mouth daily.     atorvastatin (LIPITOR) 40 MG tablet Take 1 tablet by mouth once daily 90 tablet 0   buPROPion (WELLBUTRIN XL) 150 MG 24 hr tablet Take 1 tablet by mouth once daily 30 tablet 0   butalbital-acetaminophen-caffeine (FIORICET) 50-325-40 MG tablet TAKE 1 TABLET BY MOUTH EVERY 12 HOURS AS NEEDED FOR HEADACHE 60 tablet 0   Cholecalciferol (VITAMIN D3) 1000 UNITS CAPS Take by mouth.     cyanocobalamin (,VITAMIN B-12,) 1000 MCG/ML injection INJECT 1ML INTO THE MUSCLE EVERY 30 DAYS 10 mL 0   furosemide (LASIX) 20 MG tablet Take 1 tablet by mouth once daily 90 tablet 0   glucose blood test strip Use to check blood sugar once daily. 100 each 5   lactulose (CHRONULAC) 10 GM/15ML solution Take 30 mLs (20 g total) by mouth 2 (two) times daily as needed for moderate constipation. 236 mL 0   metoprolol succinate (TOPROL-XL) 100 MG 24 hr tablet TAKE 1 TABLET BY MOUTH AT BEDTIME TAKE  WITH  OR  IMEDIATELY  FOLLOWING  A  MEAL 90 tablet 0   mometasone (ELOCON) 0.1 % cream Apply topically daily.  to ear canal as needed for itching 15 g 1   nitroGLYCERIN (NITROSTAT) 0.4 MG SL tablet Place 1 tablet (0.4 mg total) under the tongue every 5 (five) minutes as needed for chest pain. 50 tablet 3   Omega-3 Fatty Acids (FISH OIL) 1000 MG CAPS Take 1 capsule daily by mouth.     OneTouch Delica Lancets 88C MISC Use to check blood sugar once daily. 100 each 5   Syringe/Needle, Disp, (SYRINGE 3CC/25GX1") 25G X 1" 3 ML MISC Use for b12 injections 50 each 0   telmisartan (MICARDIS) 80 MG tablet Take 1 tablet by mouth once daily 90 tablet 0   tiZANidine (ZANAFLEX) 4 MG tablet Take 1 tablet (4 mg  total) by mouth at bedtime as needed for muscle spasms. 30 tablet 0   traMADol (ULTRAM) 50 MG tablet Take 1 tablet (50 mg total) by mouth every 8 (eight) hours as needed. 90 tablet 4   traZODone (DESYREL) 50 MG tablet TAKE 1 TABLET BY MOUTH AT BEDTIME 90 tablet 0   triamcinolone cream (KENALOG) 0.1 % Apply 1 application topically 2 (two) times daily. 30 g 0   warfarin (COUMADIN) 5 MG tablet Take 1 tablet (5 mg total) by mouth daily. Alternating with 6 mg dose. 30 tablet 3   warfarin (COUMADIN) 6 MG tablet Take 1 tablet (6 mg total) by mouth daily. 90 tablet 0   DULoxetine (CYMBALTA) 20 MG capsule Take 1 capsule by mouth once daily 90 capsule 0   oxybutynin (DITROPAN-XL) 5 MG 24 hr tablet TAKE 1 TABLET BY MOUTH AT BEDTIME 90 tablet 0   Semaglutide,0.25 or 0.5MG /DOS, (OZEMPIC, 0.25 OR 0.5 MG/DOSE,) 2 MG/1.5ML SOPN Inject 0.25 mg into the skin once a week. 1.5 mL 2   Facility-Administered Medications Prior to Visit  Medication Dose Route Frequency Provider Last Rate Last Admin   cyanocobalamin ((VITAMIN B-12)) injection 1,000 mcg  1,000 mcg Intramuscular Once Crecencio Mc, MD        Review of Systems;  Patient denies headache, fevers, malaise, unintentional weight loss, skin rash, eye pain, sinus congestion and sinus pain, sore throat, dysphagia,  hemoptysis , cough, dyspnea, wheezing, chest pain, palpitations, orthopnea, edema, abdominal pain, nausea, melena, diarrhea, constipation, flank pain, dysuria, hematuria, urinary  Frequency, nocturia, numbness, tingling, seizures,  Focal weakness, Loss of consciousness,  Tremor, insomnia, depression, anxiety, and suicidal ideation.      Objective:  BP 140/76 (BP Location: Left Arm, Patient Position: Sitting, Cuff Size: Large)   Pulse (!) 59   Temp (!) 96.1 F (35.6 C) (Temporal)   Ht 5\' 2"  (1.575 m)   Wt 163 lb 3.2 oz (74 kg)   SpO2 95%   BMI 29.85 kg/m   BP Readings from Last 3 Encounters:  11/15/20 140/76  09/04/20 114/78  06/28/20 (!)  144/76    Wt Readings from Last 3 Encounters:  11/15/20 163 lb 3.2 oz (74 kg)  09/04/20 162 lb (73.5 kg)  06/28/20 166 lb 12.8 oz (75.7 kg)    General appearance: alert, cooperative and appears stated age Ears: normal TM's and external ear canals both ears Throat: lips, mucosa, and tongue normal; teeth and gums normal Neck: no adenopathy, no carotid bruit, supple, symmetrical, trachea midline and thyroid not enlarged, symmetric, no tenderness/mass/nodules Back: symmetric, no curvature. ROM normal. No CVA tenderness. Lungs: clear to auscultation bilaterally Heart: regular rate and rhythm, S1, S2 normal, no murmur, click, rub or gallop Abdomen: soft, non-tender; bowel sounds normal; no masses,  no organomegaly  Pulses: 2+ and symmetric Skin: Skin color, texture, turgor normal. No rashes or lesions Lymph nodes: Cervical, supraclavicular, and axillary nodes normal. Psych;  grieving, depressed.  Not suicidal   Lab Results  Component Value Date   HGBA1C 6.2 09/04/2020   HGBA1C 6.3 (H) 05/14/2020   HGBA1C 6.2 01/13/2020    Lab Results  Component Value Date   CREATININE 0.93 10/15/2020   CREATININE 0.96 09/04/2020   CREATININE 0.96 08/22/2020    Lab Results  Component Value Date   WBC 4.6 09/04/2020   HGB 13.7 09/04/2020   HCT 41.8 09/04/2020   PLT 179.0 09/04/2020   GLUCOSE 121 (H) 10/15/2020   CHOL 162 01/13/2020   TRIG 132.0 01/13/2020   HDL 48.60 01/13/2020   LDLDIRECT 111.0 08/13/2016   LDLCALC 87 01/13/2020   ALT 16 09/04/2020   AST 19 09/04/2020   NA 140 10/15/2020   K 3.7 10/15/2020   CL 104 10/15/2020   CREATININE 0.93 10/15/2020   BUN 9 10/15/2020   CO2 27 10/15/2020   TSH 2.62 11/18/2016   INR 1.7 (H) 11/15/2020   HGBA1C 6.2 09/04/2020   MICROALBUR 2.2 (H) 11/13/2017    CT Head Wo Contrast  Result Date: 05/25/2020 CLINICAL DATA:  Headache EXAM: CT HEAD WITHOUT CONTRAST TECHNIQUE: Contiguous axial images were obtained from the base of the skull through  the vertex without intravenous contrast. COMPARISON:  04/02/2017 FINDINGS: Brain: No evidence of acute infarction, hemorrhage, hydrocephalus, extra-axial collection or mass lesion/mass effect. Scattered low-density changes within the periventricular and subcortical white matter compatible with chronic microvascular ischemic change. Vascular: Atherosclerotic calcifications involving the large vessels of the skull base. No unexpected hyperdense vessel. Skull: Normal. Negative for fracture or focal lesion. Sinuses/Orbits: No acute finding. Other: None. IMPRESSION: No acute intracranial findings. Electronically Signed   By: Davina Poke D.O.   On: 05/25/2020 13:37    Assessment & Plan:   Problem List Items Addressed This Visit     Long term current use of anticoagulant therapy - Primary   Relevant Orders   Protime-INR (Completed)   Protime-INR   Overweight (BMI 25.0-29.9)    With  Type 2 DM and hypertension.  ozempic  failed to result in significant weight loss despite creating anorexia due to severe obstipation and was stopped . Diabetes I currently controlled with diet.  Repeat a1c in 3 months   Lab Results  Component Value Date   HGBA1C 6.2 09/04/2020   Lab Results  Component Value Date   LABMICR 26.1 05/14/2020   LABMICR See below: 02/15/2020   MICROALBUR 2.2 (H) 11/13/2017   MICROALBUR 0.3 11/21/2016     Lab Results  Component Value Date   CHOL 162 01/13/2020   HDL 48.60 01/13/2020   LDLCALC 87 01/13/2020   LDLDIRECT 111.0 08/13/2016   TRIG 132.0 01/13/2020   CHOLHDL 3 01/13/2020         Controlled type 2 diabetes mellitus with microalbuminuria, without long-term current use of insulin ([facility])   Relevant Orders   Hemoglobin A1c   Comprehensive metabolic panel   Lipid panel   Grief    She is no longer taking cymbalta but prefers to remain on wellbutrin . Marland Kitchen  No chagnes today counselling given.        I spent 40  minutes dedicated to the care of this patient on the  date of this encounter to include pre-visit review of her medical history,  most recent imaging studies, Face-to-face time with the patient , and  post visit ordering of testing and therapeutics.   Meds ordered this encounter  Medications   tolterodine (DETROL LA) 2 MG 24 hr capsule    Sig: Take 1 capsule (2 mg total) by mouth daily.    Dispense:  30 capsule    Refill:  1    Medications Discontinued During This Encounter  Medication Reason   Semaglutide,0.25 or 0.5MG /DOS, (OZEMPIC, 0.25 OR 0.5 MG/DOSE,) 2 MG/1.5ML SOPN    DULoxetine (CYMBALTA) 20 MG capsule    oxybutynin (DITROPAN-XL) 5 MG 24 hr tablet     Follow-up: No follow-ups on file.   Crecencio Mc, MD

## 2020-11-15 NOTE — Assessment & Plan Note (Signed)
She is no longer taking cymbalta but prefers to remain on wellbutrin . Marland Kitchen  No chagnes today counselling given.

## 2020-11-15 NOTE — Patient Instructions (Addendum)
Try IB Guard twice daily for one month (for IBS)   Stop oxybutynin and start Detrol LA for urinary incontinence   You can start using Furosemide every other day   Walk yourself  for 30 minutes daily .   Return for fasting labs in 3 months

## 2020-11-22 ENCOUNTER — Encounter: Payer: Self-pay | Admitting: Internal Medicine

## 2020-11-25 ENCOUNTER — Other Ambulatory Visit: Payer: Self-pay | Admitting: Internal Medicine

## 2020-11-25 DIAGNOSIS — Z7901 Long term (current) use of anticoagulants: Secondary | ICD-10-CM

## 2020-11-27 ENCOUNTER — Other Ambulatory Visit: Payer: Self-pay | Admitting: Internal Medicine

## 2020-11-27 DIAGNOSIS — F409 Phobic anxiety disorder, unspecified: Secondary | ICD-10-CM

## 2020-11-28 ENCOUNTER — Other Ambulatory Visit: Payer: Self-pay | Admitting: Internal Medicine

## 2020-11-28 NOTE — Telephone Encounter (Signed)
Refilled: 04/24/2020 Last OV: 11/15/2020 Next OV: 02/15/2021

## 2020-12-04 ENCOUNTER — Other Ambulatory Visit: Payer: Self-pay | Admitting: Internal Medicine

## 2020-12-21 ENCOUNTER — Other Ambulatory Visit: Payer: Self-pay | Admitting: Internal Medicine

## 2020-12-25 ENCOUNTER — Other Ambulatory Visit: Payer: Self-pay | Admitting: Internal Medicine

## 2021-01-04 ENCOUNTER — Other Ambulatory Visit: Payer: Self-pay

## 2021-01-04 ENCOUNTER — Other Ambulatory Visit (INDEPENDENT_AMBULATORY_CARE_PROVIDER_SITE_OTHER): Payer: PPO

## 2021-01-04 DIAGNOSIS — Z7901 Long term (current) use of anticoagulants: Secondary | ICD-10-CM

## 2021-01-04 NOTE — Addendum Note (Signed)
Addended by: Leeanne Rio on: 01/04/2021 08:16 AM   Modules accepted: Orders

## 2021-01-05 ENCOUNTER — Encounter: Payer: Self-pay | Admitting: Internal Medicine

## 2021-01-05 DIAGNOSIS — Z7901 Long term (current) use of anticoagulants: Secondary | ICD-10-CM

## 2021-01-05 LAB — PROTIME-INR
INR: 5.1 — ABNORMAL HIGH
Prothrombin Time: 46.8 s — ABNORMAL HIGH (ref 9.0–11.5)

## 2021-01-08 NOTE — Telephone Encounter (Signed)
Spoke with pt and scheduled her for a lab appt on Friday 3 days after being on the 5 mg dose. Pt gave a verbal understanding.

## 2021-01-08 NOTE — Telephone Encounter (Signed)
LMTCB

## 2021-01-11 ENCOUNTER — Other Ambulatory Visit (INDEPENDENT_AMBULATORY_CARE_PROVIDER_SITE_OTHER): Payer: PPO

## 2021-01-11 ENCOUNTER — Other Ambulatory Visit: Payer: Self-pay

## 2021-01-11 DIAGNOSIS — Z7901 Long term (current) use of anticoagulants: Secondary | ICD-10-CM

## 2021-01-11 LAB — PROTIME-INR
INR: 1.9 ratio — ABNORMAL HIGH (ref 0.8–1.0)
Prothrombin Time: 19.8 s — ABNORMAL HIGH (ref 9.6–13.1)

## 2021-01-11 NOTE — Addendum Note (Signed)
Addended by: Neta Ehlers on: 01/11/2021 02:47 PM   Modules accepted: Orders

## 2021-01-11 NOTE — Addendum Note (Signed)
Addended by: Neta Ehlers on: 01/11/2021 01:19 PM   Modules accepted: Orders

## 2021-01-12 ENCOUNTER — Encounter: Payer: Self-pay | Admitting: Internal Medicine

## 2021-01-12 ENCOUNTER — Other Ambulatory Visit: Payer: Self-pay | Admitting: Internal Medicine

## 2021-01-12 DIAGNOSIS — Z7901 Long term (current) use of anticoagulants: Secondary | ICD-10-CM

## 2021-01-14 ENCOUNTER — Other Ambulatory Visit: Payer: Self-pay | Admitting: Internal Medicine

## 2021-01-28 ENCOUNTER — Other Ambulatory Visit (INDEPENDENT_AMBULATORY_CARE_PROVIDER_SITE_OTHER): Payer: PPO

## 2021-01-28 ENCOUNTER — Other Ambulatory Visit: Payer: Self-pay

## 2021-01-28 DIAGNOSIS — Z7901 Long term (current) use of anticoagulants: Secondary | ICD-10-CM

## 2021-01-28 LAB — PROTIME-INR
INR: 2.5 ratio — ABNORMAL HIGH (ref 0.8–1.0)
Prothrombin Time: 25.6 s — ABNORMAL HIGH (ref 9.6–13.1)

## 2021-02-05 ENCOUNTER — Other Ambulatory Visit: Payer: Self-pay

## 2021-02-05 ENCOUNTER — Other Ambulatory Visit: Payer: Self-pay | Admitting: Internal Medicine

## 2021-02-05 MED ORDER — WARFARIN SODIUM 5 MG PO TABS: 5.0000 mg | ORAL_TABLET | Freq: Every day | ORAL | 3 refills | Status: DC

## 2021-02-14 DIAGNOSIS — H10433 Chronic follicular conjunctivitis, bilateral: Secondary | ICD-10-CM | POA: Diagnosis not present

## 2021-02-15 ENCOUNTER — Encounter: Payer: Self-pay | Admitting: Internal Medicine

## 2021-02-15 ENCOUNTER — Ambulatory Visit (INDEPENDENT_AMBULATORY_CARE_PROVIDER_SITE_OTHER): Payer: PPO | Admitting: Internal Medicine

## 2021-02-15 ENCOUNTER — Other Ambulatory Visit: Payer: Self-pay

## 2021-02-15 VITALS — BP 138/70 | HR 79 | Temp 97.0°F | Ht 62.0 in | Wt 159.8 lb

## 2021-02-15 DIAGNOSIS — E1129 Type 2 diabetes mellitus with other diabetic kidney complication: Secondary | ICD-10-CM

## 2021-02-15 DIAGNOSIS — K5900 Constipation, unspecified: Secondary | ICD-10-CM | POA: Diagnosis not present

## 2021-02-15 DIAGNOSIS — E663 Overweight: Secondary | ICD-10-CM

## 2021-02-15 DIAGNOSIS — K591 Functional diarrhea: Secondary | ICD-10-CM

## 2021-02-15 DIAGNOSIS — Z7901 Long term (current) use of anticoagulants: Secondary | ICD-10-CM

## 2021-02-15 DIAGNOSIS — R809 Proteinuria, unspecified: Secondary | ICD-10-CM | POA: Diagnosis not present

## 2021-02-15 DIAGNOSIS — R140 Abdominal distension (gaseous): Secondary | ICD-10-CM | POA: Diagnosis not present

## 2021-02-15 DIAGNOSIS — I700 Atherosclerosis of aorta: Secondary | ICD-10-CM | POA: Diagnosis not present

## 2021-02-15 MED ORDER — PEG 3350-KCL-NABCB-NACL-NASULF 236 G PO SOLR: 4000.0000 mL | Freq: Once | ORAL | 0 refills | Status: AC

## 2021-02-15 NOTE — Progress Notes (Addendum)
Subjective:  Patient ID: [name], female    DOB: 07/19/1950  Age: 71 y.o. MRN: [id number]  CC: The primary encounter diagnosis was Anticoagulation monitoring, INR range 2-3. Diagnoses of Controlled type 2 diabetes mellitus with microalbuminuria, without long-term current use of insulin ([facility]), Abdominal bloating, Constipation, unspecified constipation type, Functional diarrhea, Aortic arch atherosclerosis ([place]), Overweight (BMI 25.0-29.9), and Anticoagulant long-term use were also pertinent to this visit.   This visit occurred during the SARS-CoV-2 public health emergency.  Safety protocols were in place, including screening questions prior to the visit, additional usage of staff PPE, and extensive cleaning of exam room while observing appropriate contact time as indicated for disinfecting solutions.    HPI [name] presents for  Chief Complaint  Patient presents with   Follow-up    50mo f/u for HTN, Hyperlipidemia. Pt states also wants to discuss ongoing epigastric pain. Pt states also has been recommended for Eyelid Lift by Eye Dr for Ptosis and would like to discuss.    1) Hypertension: patient checks blood pressure twice weekly at home.  Readings have been for the most part <  140/80 at rest . Patient is following a reduce salt diet most days and is taking medications as prescribed   2) Ptosis of eyelid:  her ophthalmologist has recommended bilateral corrective surgery for current ongoing ptosis that is limiting her vision and creating obstacles to drainage.  Will need to stop coumdin 5 days prior along with lovenox bridging   3) Epigastric pain , bloating  burping and belching "like an old man, " Occurs 24/7.  Stools are dark, but not black except for the most recent one yesterday..  The stool pattern alternates between constipation and uncontrolled diarrhea.  Tried IB guard , no  difference .  Still gets bloated and abd swells.  Wants to see [facility]. Has not tried citrucel ,  benefiber or any BFL   Outpatient Medications Prior to Visit  Medication Sig Dispense Refill   ALPRAZolam (XANAX) 0.25 MG tablet TAKE 1 TABLET BY MOUTH AT BEDTIME AS NEEDED FOR ANXIETY 30 tablet 0   amLODipine (NORVASC) 5 MG tablet Take 1 tablet (5 mg total) by mouth daily. 90 tablet 3   aspirin 81 MG tablet Take 81 mg by mouth daily.     atorvastatin (LIPITOR) 40 MG tablet Take 1 tablet by mouth once daily 90 tablet 0   buPROPion (WELLBUTRIN XL) 150 MG 24 hr tablet Take 1 tablet by mouth once daily 30 tablet 0   butalbital-acetaminophen-caffeine (FIORICET) 50-325-40 MG tablet TAKE 1 TABLET BY MOUTH EVERY 12 HOURS AS NEEDED FOR HEADACHE 60 tablet 0   Cholecalciferol (VITAMIN D3) 1000 UNITS CAPS Take by mouth.     cyanocobalamin (,VITAMIN B-12,) 1000 MCG/ML injection INJECT 1ML INTO THE MUSCLE EVERY 30 DAYS 10 mL 0   furosemide (LASIX) 20 MG tablet Take 1 tablet by mouth once daily 90 tablet 0   glucose blood test strip Use to check blood sugar once daily. 100 each 5   lactulose (CHRONULAC) 10 GM/15ML solution Take 30 mLs (20 g total) by mouth 2 (two) times daily as needed for moderate constipation. 236 mL 0   metoprolol succinate (TOPROL-XL) 100 MG 24 hr tablet TAKE 1 TABLET BY MOUTH AT BEDTIME (TAKE  WITH  OR  IMMEDIATELY  FOLLOWING  A  MEAL) 90 tablet 0   nitroGLYCERIN (NITROSTAT) 0.4 MG SL tablet Place 1 tablet (0.4 mg total) under the tongue every 5 (five) minutes  as needed for chest pain. 50 tablet 3   Omega-3 Fatty Acids (FISH OIL) 1000 MG CAPS Take 1 capsule daily by mouth.     OneTouch Delica Lancets 72Z MISC Use to check blood sugar once daily. 100 each 5   Syringe/Needle, Disp, (SYRINGE 3CC/25GX1") 25G X 1" 3 ML MISC Use for b12 injections 50 each 0   telmisartan (MICARDIS) 80 MG tablet Take 1 tablet by mouth once daily 90 tablet 1   tiZANidine (ZANAFLEX) 4 MG tablet Take 1 tablet (4 mg total) by mouth at bedtime as needed for muscle spasms. 30 tablet 0   tolterodine (DETROL LA) 2 MG  24 hr capsule Take 1 capsule by mouth once daily 90 capsule 1   traZODone (DESYREL) 50 MG tablet TAKE 1 TABLET BY MOUTH AT BEDTIME 90 tablet 1   warfarin (COUMADIN) 5 MG tablet Take 1 tablet (5 mg total) by mouth daily. Alternating with 6 mg dose. 30 tablet 3   warfarin (COUMADIN) 6 MG tablet Take 1 tablet (6 mg total) by mouth daily. 90 tablet 0   mometasone (ELOCON) 0.1 % cream Apply topically daily. to ear canal as needed for itching (Patient not taking: Reported on 02/15/2021) 15 g 1   traMADol (ULTRAM) 50 MG tablet Take 1 tablet (50 mg total) by mouth every 8 (eight) hours as needed. (Patient not taking: Reported on 02/15/2021) 90 tablet 4   triamcinolone cream (KENALOG) 0.1 % Apply 1 application topically 2 (two) times daily. (Patient not taking: Reported on 02/15/2021) 30 g 0   Facility-Administered Medications Prior to Visit  Medication Dose Route Frequency Provider Last Rate Last Admin   cyanocobalamin ((VITAMIN B-12)) injection 1,000 mcg  1,000 mcg Intramuscular Once Crecencio Mc, MD        Review of Systems;  Patient denies headache, fevers, malaise, unintentional weight loss, skin rash, eye pain, sinus congestion and sinus pain, sore throat, dysphagia,  hemoptysis , cough, dyspnea, wheezing, chest pain, palpitations, orthopnea, edema, abdominal pain, nausea, melena, diarrhea, constipation, flank pain, dysuria, hematuria, urinary  Frequency, nocturia, numbness, tingling, seizures,  Focal weakness, Loss of consciousness,  Tremor, insomnia, depression, anxiety, and suicidal ideation.      Objective:  BP 138/70 (BP Location: Left Arm, Patient Position: Sitting, Cuff Size: Small)    Pulse 79    Temp (!) 97 F (36.1 C) (Oral)    Ht 5\' 2"  (1.575 m)    Wt 159 lb 12.8 oz (72.5 kg)    SpO2 97%    BMI 29.23 kg/m   BP Readings from Last 3 Encounters:  02/15/21 138/70  11/15/20 140/76  09/04/20 114/78    Wt Readings from Last 3 Encounters:  02/15/21 159 lb 12.8 oz (72.5 kg)  11/15/20  163 lb 3.2 oz (74 kg)  09/04/20 162 lb (73.5 kg)    General appearance: alert, cooperative and appears stated age Ears: normal TM's and external ear canals both ears Throat: lips, mucosa, and tongue normal; teeth and gums normal Neck: no adenopathy, no carotid bruit, supple, symmetrical, trachea midline and thyroid not enlarged, symmetric, no tenderness/mass/nodules Back: symmetric, no curvature. ROM normal. No CVA tenderness. Lungs: clear to auscultation bilaterally Heart: regular rate and rhythm, S1, S2 normal, no murmur, click, rub or gallop Abdomen: soft, non-tender; bowel sounds normal; no masses,  no organomegaly Pulses: 2+ and symmetric Skin: Skin color, texture, turgor normal. No rashes or lesions Lymph nodes: Cervical, supraclavicular, and axillary nodes normal.  Lab Results  Component Value Date  HGBA1C 5.9 (H) 02/15/2021   HGBA1C 6.2 09/04/2020   HGBA1C 6.3 (H) 05/14/2020    Lab Results  Component Value Date   CREATININE 1.06 (H) 02/15/2021   CREATININE 0.93 10/15/2020   CREATININE 0.96 09/04/2020    Lab Results  Component Value Date   WBC 5.8 02/15/2021   HGB 14.4 02/15/2021   HCT 43.6 02/15/2021   PLT 175 02/15/2021   GLUCOSE 87 02/15/2021   CHOL 158 02/15/2021   TRIG 158 (H) 02/15/2021   HDL 52 02/15/2021   LDLDIRECT 111.0 08/13/2016   LDLCALC 81 02/15/2021   ALT 14 02/15/2021   AST 17 02/15/2021   NA 141 02/15/2021   K 4.5 02/15/2021   CL 103 02/15/2021   CREATININE 1.06 (H) 02/15/2021   BUN 16 02/15/2021   CO2 28 02/15/2021   TSH 2.62 11/18/2016   INR 1.5 (H) 02/15/2021   HGBA1C 5.9 (H) 02/15/2021   MICROALBUR 2.2 (H) 11/13/2017    CT Head Wo Contrast  Result Date: 05/25/2020 CLINICAL DATA:  Headache EXAM: CT HEAD WITHOUT CONTRAST TECHNIQUE: Contiguous axial images were obtained from the base of the skull through the vertex without intravenous contrast. COMPARISON:  04/02/2017 FINDINGS: Brain: No evidence of acute infarction, hemorrhage,  hydrocephalus, extra-axial collection or mass lesion/mass effect. Scattered low-density changes within the periventricular and subcortical white matter compatible with chronic microvascular ischemic change. Vascular: Atherosclerotic calcifications involving the large vessels of the skull base. No unexpected hyperdense vessel. Skull: Normal. Negative for fracture or focal lesion. Sinuses/Orbits: No acute finding. Other: None. IMPRESSION: No acute intracranial findings. Electronically Signed   By: Davina Poke D.O.   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  INR is low  On 5 mg alternativng with 6 mg daily.  Will increase dose to 6 mg daily and repeat INR  in one week.    For her upcoming surgery,  She will need coumadin suspension 5 days prior to eyelid surgery , with lovenox bridging starting 2 days prior to surgery.       Relevant Orders   CBC with Differential/Platelet (Completed)   Protime-INR (Completed)   Aortic arch atherosclerosis ([place])    .Reviewed findings of prior CT scan today..  Patient is tolerating high potency statin therapy with atorvastatin 40 mg daily       Relevant Medications   warfarin (COUMADIN) 6 MG tablet   Controlled type 2 diabetes mellitus with microalbuminuria, without long-term current use of insulin ([place])    She had to stop  Ozempic due to persistent side effects of nausea, bloating and diarrhea. Her diabetes is well controlled without medications.    Lab Results  Component Value Date   HGBA1C 5.9 (H) 02/15/2021   Lab Results  Component Value Date   LABMICR 26.1 05/14/2020   LABMICR See below: 02/15/2020   MICROALBUR 2.2 (H) 11/13/2017   MICROALBUR 0.3 11/21/2016           Relevant Orders   Hemoglobin A1c (Completed)   Comprehensive metabolic panel (Completed)   Lipid panel (Completed)    Functional diarrhea    ddx includes chronic constipation with stooling around impacted stool,  SIBO.  Will treat with cathartic, BFLs as a trial.  Referral to Dr Alice Reichert made for evaluation for SIBO      Relevant Orders  Ambulatory referral to Gastroenterology   Overweight (BMI 25.0-29.9)    With  Type 2 DM and hypertension.  ozempic  failed to result in significant weight loss despite creating anorexia due to severe obstipation and was stopped . Diabetes is currently controlled with diet.  Repeat a1c in 3 months   Lab Results  Component Value Date   HGBA1C 5.9 (H) 02/15/2021   Lab Results  Component Value Date   LABMICR 26.1 05/14/2020   LABMICR See below: 02/15/2020   MICROALBUR 2.2 (H) 11/13/2017   MICROALBUR 0.3 11/21/2016     Lab Results  Component Value Date   CHOL 158 02/15/2021   HDL 52 02/15/2021   LDLCALC 81 02/15/2021   LDLDIRECT 111.0 08/13/2016   TRIG 158 (H) 02/15/2021   CHOLHDL 3.0 02/15/2021         Other Visit Diagnoses     Abdominal bloating       Relevant Orders   Ambulatory referral to Gastroenterology   Constipation, unspecified constipation type       Relevant Orders   Ambulatory referral to Gastroenterology   Anticoagulant long-term use       Relevant Medications   warfarin (COUMADIN) 6 MG tablet       I spent 30 minutes dedicated to the care of this patient on the date of this encounter to include pre-visit review of patient's medical history,  most recent imaging studies, Face-to-face time with the patient , and post visit ordering of testing and therapeutics.    Follow-up: Return in about 3 months (around 05/15/2021) for follow up diabetes.   Crecencio Mc, MD

## 2021-02-15 NOTE — Patient Instructions (Addendum)
Let me know the date of your eyelid surgery and I will send the lovenox along with direction for bridging   I will make a referral to [place] for possible small bowel overgrowth , but in the meantime, let's try to correct constipation:   I want you try taking Benefiber and magnesium citrate 250 mg every night   If no results in 3 days,  take the GoLytely  and resume benefiber and mg citrate the following day

## 2021-02-16 DIAGNOSIS — K591 Functional diarrhea: Secondary | ICD-10-CM | POA: Insufficient documentation

## 2021-02-16 LAB — CBC WITH DIFFERENTIAL/PLATELET
Absolute Monocytes: 505 cells/uL (ref 200–950)
Basophils Absolute: 52 cells/uL (ref 0–200)
Basophils Relative: 0.9 %
Eosinophils Absolute: 209 cells/uL (ref 15–500)
Eosinophils Relative: 3.6 %
HCT: 43.6 % (ref 35.0–45.0)
Hemoglobin: 14.4 g/dL (ref 11.7–15.5)
Lymphs Abs: 1989 cells/uL (ref 850–3900)
MCH: 28.3 pg (ref 27.0–33.0)
MCHC: 33 g/dL (ref 32.0–36.0)
MCV: 85.7 fL (ref 80.0–100.0)
MPV: 12.4 fL (ref 7.5–12.5)
Monocytes Relative: 8.7 %
Neutro Abs: 3045 cells/uL (ref 1500–7800)
Neutrophils Relative %: 52.5 %
Platelets: 175 10*3/uL (ref 140–400)
RBC: 5.09 10*6/uL (ref 3.80–5.10)
RDW: 14 % (ref 11.0–15.0)
Total Lymphocyte: 34.3 %
WBC: 5.8 10*3/uL (ref 3.8–10.8)

## 2021-02-16 LAB — COMPREHENSIVE METABOLIC PANEL
AG Ratio: 2.3 (calc) (ref 1.0–2.5)
ALT: 14 U/L (ref 6–29)
AST: 17 U/L (ref 10–35)
Albumin: 4.4 g/dL (ref 3.6–5.1)
Alkaline phosphatase (APISO): 87 U/L (ref 37–153)
BUN/Creatinine Ratio: 15 (calc) (ref 6–22)
BUN: 16 mg/dL (ref 7–25)
CO2: 28 mmol/L (ref 20–32)
Calcium: 9.4 mg/dL (ref 8.6–10.4)
Chloride: 103 mmol/L (ref 98–110)
Creat: 1.06 mg/dL — ABNORMAL HIGH (ref 0.60–1.00)
Globulin: 1.9 g/dL (calc) (ref 1.9–3.7)
Glucose, Bld: 87 mg/dL (ref 65–99)
Potassium: 4.5 mmol/L (ref 3.5–5.3)
Sodium: 141 mmol/L (ref 135–146)
Total Bilirubin: 0.6 mg/dL (ref 0.2–1.2)
Total Protein: 6.3 g/dL (ref 6.1–8.1)

## 2021-02-16 LAB — HEMOGLOBIN A1C
Hgb A1c MFr Bld: 5.9 % of total Hgb — ABNORMAL HIGH (ref <5.7)
Mean Plasma Glucose: 123 mg/dL
eAG (mmol/L): 6.8 mmol/L

## 2021-02-16 LAB — PROTIME-INR
INR: 1.5 — ABNORMAL HIGH
Prothrombin Time: 15.3 s — ABNORMAL HIGH (ref 9.0–11.5)

## 2021-02-16 LAB — LIPID PANEL
Cholesterol: 158 mg/dL (ref <200)
HDL: 52 mg/dL (ref >=50)
LDL Cholesterol (Calc): 81 mg/dL (calc)
Non-HDL Cholesterol (Calc): 106 mg/dL (calc) (ref <130)
Total CHOL/HDL Ratio: 3 (calc) (ref <5.0)
Triglycerides: 158 mg/dL — ABNORMAL HIGH (ref <150)

## 2021-02-16 MED ORDER — WARFARIN SODIUM 6 MG PO TABS: 6.0000 mg | ORAL_TABLET | Freq: Every day | ORAL | 0 refills | Status: DC

## 2021-02-16 NOTE — Assessment & Plan Note (Signed)
With  Type 2 DM and hypertension.  ozempic  failed to result in significant weight loss despite creating anorexia due to severe obstipation and was stopped . Diabetes is currently controlled with diet.  Repeat a1c in 3 months   Lab Results  Component Value Date   HGBA1C 5.9 (H) 02/15/2021   Lab Results  Component Value Date   LABMICR 26.1 05/14/2020   LABMICR See below: 02/15/2020   MICROALBUR 2.2 (H) 11/13/2017   MICROALBUR 0.3 11/21/2016     Lab Results  Component Value Date   CHOL 158 02/15/2021   HDL 52 02/15/2021   LDLCALC 81 02/15/2021   LDLDIRECT 111.0 08/13/2016   TRIG 158 (H) 02/15/2021   CHOLHDL 3.0 02/15/2021

## 2021-02-16 NOTE — Addendum Note (Signed)
Addended by: Crecencio Mc on: 02/16/2021 05:05 PM   Modules accepted: Orders

## 2021-02-16 NOTE — Assessment & Plan Note (Addendum)
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  INR is low  On 5 mg alternativng with 6 mg daily.  Will increase dose to 6 mg daily and repeat INR  in one week.    For her upcoming surgery,  She will need coumadin suspension 5 days prior to eyelid surgery , with lovenox bridging starting 2 days prior to surgery.

## 2021-02-16 NOTE — Assessment & Plan Note (Signed)
She had to stop  Ozempic due to persistent side effects of nausea, bloating and diarrhea. Her diabetes is well controlled without medications.    Lab Results  Component Value Date   HGBA1C 5.9 (H) 02/15/2021   Lab Results  Component Value Date   LABMICR 26.1 05/14/2020   LABMICR See below: 02/15/2020   MICROALBUR 2.2 (H) 11/13/2017   MICROALBUR 0.3 11/21/2016

## 2021-02-16 NOTE — Assessment & Plan Note (Signed)
.  Reviewed findings of prior CT scan today..  Patient is tolerating high potency statin therapy with atorvastatin 40 mg daily

## 2021-02-16 NOTE — Assessment & Plan Note (Signed)
ddx includes chronic constipation with stooling around impacted stool,  SIBO.  Will treat with cathartic, BFLs as a trial.  Referral to Dr Alice Reichert made for evaluation for SIBO

## 2021-02-18 ENCOUNTER — Telehealth: Payer: Self-pay

## 2021-02-18 DIAGNOSIS — Z7901 Long term (current) use of anticoagulants: Secondary | ICD-10-CM

## 2021-02-18 NOTE — Telephone Encounter (Signed)
Lab order placed for lab appt.  °

## 2021-02-25 ENCOUNTER — Other Ambulatory Visit: Payer: Self-pay

## 2021-02-25 ENCOUNTER — Other Ambulatory Visit (INDEPENDENT_AMBULATORY_CARE_PROVIDER_SITE_OTHER): Payer: PPO

## 2021-02-25 ENCOUNTER — Other Ambulatory Visit: Payer: Self-pay | Admitting: Internal Medicine

## 2021-02-25 DIAGNOSIS — Z7901 Long term (current) use of anticoagulants: Secondary | ICD-10-CM

## 2021-02-25 LAB — PROTIME-INR
INR: 3.3 ratio — ABNORMAL HIGH (ref 0.8–1.0)
Prothrombin Time: 33.6 s — ABNORMAL HIGH (ref 9.6–13.1)

## 2021-02-26 NOTE — Assessment & Plan Note (Signed)
INR was high on 6 mg   And low on 5 mg.  Regimen changed to 6 mg alternating with 5 mg   .  10 mg tonight only.  Recheck one week

## 2021-03-07 ENCOUNTER — Other Ambulatory Visit: Payer: Self-pay | Admitting: Internal Medicine

## 2021-03-07 DIAGNOSIS — Z1231 Encounter for screening mammogram for malignant neoplasm of breast: Secondary | ICD-10-CM

## 2021-03-08 ENCOUNTER — Other Ambulatory Visit: Payer: Self-pay

## 2021-03-08 ENCOUNTER — Other Ambulatory Visit (INDEPENDENT_AMBULATORY_CARE_PROVIDER_SITE_OTHER): Payer: PPO

## 2021-03-08 DIAGNOSIS — Z7901 Long term (current) use of anticoagulants: Secondary | ICD-10-CM

## 2021-03-08 LAB — PROTIME-INR
INR: 3.5 ratio — ABNORMAL HIGH (ref 0.8–1.0)
Prothrombin Time: 35.4 s — ABNORMAL HIGH (ref 9.6–13.1)

## 2021-03-12 ENCOUNTER — Encounter: Payer: Self-pay | Admitting: Internal Medicine

## 2021-03-12 DIAGNOSIS — Z7901 Long term (current) use of anticoagulants: Secondary | ICD-10-CM

## 2021-03-12 NOTE — Assessment & Plan Note (Signed)
INR is 3.5 on 5 mg alternating with 6 mg.  Advised to suspend warfarin for one day, then resume 5 mg daily dose and repeat INR in one week  ?

## 2021-04-01 ENCOUNTER — Other Ambulatory Visit: Payer: Self-pay | Admitting: Internal Medicine

## 2021-04-01 ENCOUNTER — Telehealth: Payer: Self-pay | Admitting: Internal Medicine

## 2021-04-01 ENCOUNTER — Other Ambulatory Visit: Payer: Self-pay

## 2021-04-01 DIAGNOSIS — Z7901 Long term (current) use of anticoagulants: Secondary | ICD-10-CM

## 2021-04-01 NOTE — Telephone Encounter (Signed)
Order is in.

## 2021-04-01 NOTE — Telephone Encounter (Signed)
Pt called in requesting for PT INR lab... No lab order place... Pt stated that Dr. Derrel Nip requested her to return for lab... Pt requesting callback...  ?

## 2021-04-03 ENCOUNTER — Encounter: Payer: Self-pay | Admitting: Internal Medicine

## 2021-04-04 ENCOUNTER — Other Ambulatory Visit (INDEPENDENT_AMBULATORY_CARE_PROVIDER_SITE_OTHER): Payer: PPO

## 2021-04-04 DIAGNOSIS — D6859 Other primary thrombophilia: Secondary | ICD-10-CM

## 2021-04-04 DIAGNOSIS — Z7901 Long term (current) use of anticoagulants: Secondary | ICD-10-CM | POA: Diagnosis not present

## 2021-04-04 LAB — PROTIME-INR
INR: 2.3 ratio — ABNORMAL HIGH (ref 0.8–1.0)
Prothrombin Time: 23.8 s — ABNORMAL HIGH (ref 9.6–13.1)

## 2021-04-05 MED ORDER — ENOXAPARIN SODIUM 80 MG/0.8ML IJ SOSY: 73.0000 mg | PREFILLED_SYRINGE | Freq: Two times a day (BID) | INTRAMUSCULAR | 0 refills | Status: DC

## 2021-04-05 NOTE — Addendum Note (Signed)
Addended by: Crecencio Mc on: 04/05/2021 12:44 PM ? ? Modules accepted: Orders ? ?

## 2021-04-10 ENCOUNTER — Other Ambulatory Visit: Payer: Self-pay | Admitting: Internal Medicine

## 2021-04-10 DIAGNOSIS — D6859 Other primary thrombophilia: Secondary | ICD-10-CM

## 2021-04-10 MED ORDER — ENOXAPARIN SODIUM 80 MG/0.8ML IJ SOSY: 73.0000 mg | PREFILLED_SYRINGE | Freq: Two times a day (BID) | INTRAMUSCULAR | 0 refills | Status: DC

## 2021-04-15 ENCOUNTER — Ambulatory Visit
Admission: RE | Admit: 2021-04-15 | Discharge: 2021-04-15 | Disposition: A | Discharge status: Home / Self Care | Payer: PPO | Source: Ambulatory Visit | Attending: Internal Medicine | Admitting: Internal Medicine

## 2021-04-15 DIAGNOSIS — Z1231 Encounter for screening mammogram for malignant neoplasm of breast: Secondary | ICD-10-CM | POA: Insufficient documentation

## 2021-04-16 ENCOUNTER — Other Ambulatory Visit: Payer: Self-pay | Admitting: Nurse Practitioner

## 2021-04-16 DIAGNOSIS — R1013 Epigastric pain: Secondary | ICD-10-CM | POA: Diagnosis not present

## 2021-04-16 DIAGNOSIS — R198 Other specified symptoms and signs involving the digestive system and abdomen: Secondary | ICD-10-CM | POA: Diagnosis not present

## 2021-04-16 DIAGNOSIS — Z86010 Personal history of colonic polyps: Secondary | ICD-10-CM | POA: Diagnosis not present

## 2021-04-16 DIAGNOSIS — K219 Gastro-esophageal reflux disease without esophagitis: Secondary | ICD-10-CM | POA: Diagnosis not present

## 2021-04-16 DIAGNOSIS — R0789 Other chest pain: Secondary | ICD-10-CM | POA: Diagnosis not present

## 2021-04-19 DIAGNOSIS — H02132 Senile ectropion of right lower eyelid: Secondary | ICD-10-CM | POA: Diagnosis not present

## 2021-04-19 DIAGNOSIS — R519 Headache, unspecified: Secondary | ICD-10-CM | POA: Diagnosis not present

## 2021-04-19 DIAGNOSIS — K219 Gastro-esophageal reflux disease without esophagitis: Secondary | ICD-10-CM | POA: Diagnosis not present

## 2021-04-19 DIAGNOSIS — I10 Essential (primary) hypertension: Secondary | ICD-10-CM | POA: Diagnosis not present

## 2021-04-19 DIAGNOSIS — Z79899 Other long term (current) drug therapy: Secondary | ICD-10-CM | POA: Diagnosis not present

## 2021-04-19 DIAGNOSIS — H02102 Unspecified ectropion of right lower eyelid: Secondary | ICD-10-CM | POA: Diagnosis not present

## 2021-04-19 DIAGNOSIS — Z7982 Long term (current) use of aspirin: Secondary | ICD-10-CM | POA: Diagnosis not present

## 2021-04-19 DIAGNOSIS — Z8619 Personal history of other infectious and parasitic diseases: Secondary | ICD-10-CM | POA: Diagnosis not present

## 2021-04-19 DIAGNOSIS — Z7901 Long term (current) use of anticoagulants: Secondary | ICD-10-CM | POA: Diagnosis not present

## 2021-04-19 DIAGNOSIS — E785 Hyperlipidemia, unspecified: Secondary | ICD-10-CM | POA: Diagnosis not present

## 2021-04-19 DIAGNOSIS — Z9049 Acquired absence of other specified parts of digestive tract: Secondary | ICD-10-CM | POA: Diagnosis not present

## 2021-04-19 DIAGNOSIS — H02135 Senile ectropion of left lower eyelid: Secondary | ICD-10-CM | POA: Diagnosis not present

## 2021-04-19 DIAGNOSIS — H02105 Unspecified ectropion of left lower eyelid: Secondary | ICD-10-CM | POA: Diagnosis not present

## 2021-04-19 DIAGNOSIS — H0259 Other disorders affecting eyelid function: Secondary | ICD-10-CM | POA: Diagnosis not present

## 2021-04-19 DIAGNOSIS — D6869 Other thrombophilia: Secondary | ICD-10-CM | POA: Diagnosis not present

## 2021-04-19 DIAGNOSIS — Z86711 Personal history of pulmonary embolism: Secondary | ICD-10-CM | POA: Diagnosis not present

## 2021-04-19 DIAGNOSIS — H16213 Exposure keratoconjunctivitis, bilateral: Secondary | ICD-10-CM | POA: Diagnosis not present

## 2021-04-19 DIAGNOSIS — E119 Type 2 diabetes mellitus without complications: Secondary | ICD-10-CM | POA: Diagnosis not present

## 2021-04-26 ENCOUNTER — Ambulatory Visit
Admission: RE | Admit: 2021-04-26 | Discharge: 2021-04-26 | Disposition: A | Discharge status: Home / Self Care | Payer: PPO | Source: Ambulatory Visit | Attending: Nurse Practitioner | Admitting: Nurse Practitioner

## 2021-04-26 DIAGNOSIS — R1013 Epigastric pain: Secondary | ICD-10-CM | POA: Insufficient documentation

## 2021-04-26 DIAGNOSIS — I700 Atherosclerosis of aorta: Secondary | ICD-10-CM | POA: Diagnosis not present

## 2021-04-26 DIAGNOSIS — R109 Unspecified abdominal pain: Secondary | ICD-10-CM | POA: Diagnosis not present

## 2021-04-26 MED ORDER — IOHEXOL 300 MG/ML  SOLN
100.0000 mL | Freq: Once | INTRAMUSCULAR | Status: AC | PRN
  Administered 2021-04-26: 100 mL via INTRAVENOUS

## 2021-05-01 ENCOUNTER — Ambulatory Visit: Payer: PPO

## 2021-05-08 ENCOUNTER — Ambulatory Visit (INDEPENDENT_AMBULATORY_CARE_PROVIDER_SITE_OTHER): Payer: PPO

## 2021-05-08 VITALS — Ht 62.0 in | Wt 159.0 lb

## 2021-05-08 DIAGNOSIS — Z0000 Encounter for general adult medical examination without abnormal findings: Secondary | ICD-10-CM | POA: Diagnosis not present

## 2021-05-08 DIAGNOSIS — Z7901 Long term (current) use of anticoagulants: Secondary | ICD-10-CM

## 2021-05-08 NOTE — Progress Notes (Addendum)
Subjective:   [name] is a 71 y.o. female who presents for Medicare Annual (Subsequent) preventive examination.  Review of Systems    No ROS.  Medicare Wellness Virtual Visit.  Visual/audio telehealth visit, UTA vital signs.   See social history for additional risk factors.   Cardiac Risk Factors include: advanced age (>30men, >58 women);diabetes mellitus;hypertension     Objective:    Today's Vitals   05/08/21 1516  Weight: 159 lb (72.1 kg)  Height: 5\' 2"  (1.575 m)   Body mass index is 29.08 kg/m.     05/08/2021    3:29 PM 04/30/2020   11:57 AM 06/20/2019    8:37 AM 05/30/2019    6:42 AM 04/28/2019   11:14 AM 04/27/2018   11:13 AM 03/23/2017    3:26 PM  Advanced Directives  Does Patient Have a Medical Advance Directive? No Yes Yes No No Yes Yes  Type of Furniture conservator/restorer;Living will Healthcare Power of [place];Living will   Living will Living will;Healthcare Power of Attorney  Does patient want to make changes to medical advance directive?  No - Patient declined No - Patient declined  Yes (MAU/Ambulatory/Procedural Areas - Information given) No - Patient declined No - Patient declined  Copy of Healthcare Power of Attorney in Chart?  No - copy requested No - copy requested    No - copy requested  Would patient like information on creating a medical advance directive? No - Patient declined  No - Patient declined No - Patient declined       Current Medications (verified) Outpatient Encounter Medications as of 05/08/2021  Medication Sig   ALPRAZolam (XANAX) 0.25 MG tablet TAKE 1 TABLET BY MOUTH AT BEDTIME AS NEEDED FOR ANXIETY   amLODipine (NORVASC) 5 MG tablet Take 1 tablet (5 mg total) by mouth daily.   aspirin 81 MG tablet Take 81 mg by mouth daily.   atorvastatin (LIPITOR) 40 MG tablet Take 1 tablet by mouth once daily   buPROPion (WELLBUTRIN XL) 150 MG 24 hr tablet Take 1 tablet by mouth once daily   butalbital-acetaminophen-caffeine  (FIORICET) 50-325-40 MG tablet TAKE 1 TABLET BY MOUTH EVERY 12 HOURS AS NEEDED FOR HEADACHE   Cholecalciferol (VITAMIN D3) 1000 UNITS CAPS Take by mouth.   cyanocobalamin (,VITAMIN B-12,) 1000 MCG/ML injection INJECT INTO THE MUSCLE EVERY 30 DAYS   enoxaparin (LOVENOX) 80 MG/0.8ML injection Inject 0.725 mLs (73 mg total) into the skin every 12 (twelve) hours for 4 doses. DOSE IS 73 ML EVERY 12 HOURS,  NEEDS 4 SYRINGES   furosemide (LASIX) 20 MG tablet Take 1 tablet by mouth once daily   glucose blood test strip Use to check blood sugar once daily.   lactulose (CHRONULAC) 10 GM/15ML solution Take 30 mLs (20 g total) by mouth 2 (two) times daily as needed for moderate constipation.   metoprolol succinate (TOPROL-XL) 100 MG 24 hr tablet TAKE 1 TABLET BY MOUTH ONCE DAILY AT BEDTIME -  WITH  OR  IMMEDIATELY  FOLLOWING  A  MEAL   mometasone (ELOCON) 0.1 % cream Apply topically daily. to ear canal as needed for itching (Patient not taking: Reported on 02/15/2021)   nitroGLYCERIN (NITROSTAT) 0.4 MG SL tablet Place 1 tablet (0.4 mg total) under the tongue every 5 (five) minutes as needed for chest pain.   Omega-3 Fatty Acids (FISH OIL) 1000 MG CAPS Take 1 capsule daily by mouth.   OneTouch Delica Lancets 33G MISC Use to check  blood sugar once daily.   Syringe/Needle, Disp, (SYRINGE 3CC/25GX1") 25G X 1" 3 ML MISC Use for b12 injections   telmisartan (MICARDIS) 80 MG tablet Take 1 tablet by mouth once daily   tiZANidine (ZANAFLEX) 4 MG tablet Take 1 tablet (4 mg total) by mouth at bedtime as needed for muscle spasms.   tolterodine (DETROL LA) 2 MG 24 hr capsule Take 1 capsule by mouth once daily   traMADol (ULTRAM) 50 MG tablet Take 1 tablet (50 mg total) by mouth every 8 (eight) hours as needed. (Patient not taking: Reported on 02/15/2021)   traZODone (DESYREL) 50 MG tablet TAKE 1 TABLET BY MOUTH AT BEDTIME   triamcinolone cream (KENALOG) 0.1 % Apply 1 application topically 2 (two) times daily. (Patient  not taking: Reported on 02/15/2021)   warfarin (COUMADIN) 6 MG tablet Take 1 tablet (6 mg total) by mouth daily.   Facility-Administered Encounter Medications as of 05/08/2021  Medication   cyanocobalamin ((VITAMIN B-12)) injection 1,000 mcg    Allergies (verified) Patient has no known allergies.   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, Lupus ACA   HOH (hard of hearing)    wears hearing aids   Hyperlipidemia    Hypertension    Ischemic colitis (HCC) 2012-2013   s/p left colectomy May 2013   Ischemic colitis (HCC)    Kidney stones    PONV (postoperative nausea and vomiting)    Past Surgical History:  Procedure Laterality Date   ABDOMINAL HYSTERECTOMY  1996   APPENDECTOMY     bilateral tubal ligation     BREAST EXCISIONAL BIOPSY Left 1989   benign   BREAST SURGERY  1989   left biopsy, normal   CATARACT EXTRACTION W/PHACO Right 05/30/2019   Procedure: CATARACT EXTRACTION PHACO AND INTRAOCULAR LENS PLACEMENT (IOC) RIGHT DIABETIC;  Surgeon: Nevada Crane, MD;  Location: [facility];  Service: Ophthalmology;  Laterality: Right;  3.84 0:37.1   CATARACT EXTRACTION W/PHACO Left 06/20/2019   Procedure: CATARACT EXTRACTION PHACO AND INTRAOCULAR LENS PLACEMENT (IOC) LEFT DIABETIC 2.58  00:29.3;  Surgeon: Nevada Crane, MD;  Location: [facility];  Service: Ophthalmology;  Laterality: Left;  Diabetic   COLECTOMY Left May 2013   [place], ischemic colitis   COLONOSCOPY WITH PROPOFOL N/A 11/16/2014   Procedure: COLONOSCOPY WITH PROPOFOL;  Surgeon: Scot Jun, MD;  Location: [facility]  ENDOSCOPY;  Service: Endoscopy;  Laterality: N/A;   ESOPHAGOGASTRODUODENOSCOPY (EGD) WITH PROPOFOL N/A 11/16/2014   Procedure: ESOPHAGOGASTRODUODENOSCOPY (EGD) WITH PROPOFOL;  Surgeon: Scot Jun, MD;  Location: [facility] END[facility]COPY;  Service: Endoscopy;  Laterality: N/A;   SPINE SURGERY  1984   lumbar diskectomy, [place]   ureterolithotomy     Dr. Artis Flock urology ~ 2012 kidney stones in ICU after surgery   Family History  Problem Relation Age of Onset   Hypertension Mother    Cancer Mother    Diabetes Mother    Congestive Heart Failure Mother    Heart disease Father    Hypertension Father    Diabetes Father  Alzheimer's disease Sister    Breast cancer Sister 54       DCIS   Breast cancer Paternal Aunt        <50   Breast cancer Cousin    Social History   Socioeconomic History   Marital status: Widowed    Spouse name: Not on file   Number of children: Not on file   Years of education: Not on file   Highest education level: Not on file  Occupational History   Not on file  Tobacco Use   Smoking status: Never   Smokeless tobacco: Never  Vaping Use   Vaping Use: Never used  Substance and Sexual Activity   Alcohol use: No   Drug use: No   Sexual activity: Yes  Other Topics Concern   Not on file  Social History Narrative   Not on file   Social Determinants of Health   Financial Resource Strain: Low Risk    Difficulty of Paying Living Expenses: Not hard at all  Food Insecurity: No Food Insecurity   Worried About Running Out of Food in the Last Year: Never true   Ran Out of Food in the Last Year: Never true  Transportation Needs: No Transportation Needs   Lack of Transportation (Medical): No   Lack of Transportation (Non-Medical): No  Physical Activity: Not on file  Stress: No Stress Concern Present   Feeling of Stress : Not at all  Social Connections: Unknown   Frequency of Communication with Friends and Family: More than three times a week   Frequency of Social  Gatherings with Friends and Family: More than three times a week   Attends Religious Services: Not on file   Active Member of Clubs or Organizations: Not on file   Attends Banker Meetings: Not on file   Marital Status: Widowed    Tobacco Counseling Counseling given: Not Answered   Clinical Intake:  Pre-visit preparation completed: Yes        Diabetes: No  How often do you need to have someone help you when you read instructions, pamphlets, or other written materials from your doctor or pharmacy?: 1 - Never   Interpreter Needed?: No    Activities of Daily Living    05/08/2021    3:33 PM  In your present state of health, do you have any difficulty performing the following activities:  Hearing? 1  Comment Hearing aids  Vision? 0  Difficulty concentrating or making decisions? 0  Walking or climbing stairs? 0  Dressing or bathing? 0  Doing errands, shopping? 0  Preparing Food and eating ? N  Using the Toilet? N  In the past six months, have you accidently leaked urine? N  Do you have problems with loss of bowel control? Y  Comment Followed by GI  Managing your Medications? N  Managing your Finances? N  Housekeeping or managing your Housekeeping? N    Patient Care Team: Sherlene Shams, MD as PCP - General (Internal Medicine) Debbe Odea, MD as PCP - Cardiology (Cardiology) Scot Jun, MD (Inactive) (Gastroenterology)  Indicate any recent Medical Services you may have received from other than [facility] providers in the past year (date may be approximate).     Assessment:   This is a routine wellness examination for Rhylee.  Virtual Visit via Telephone Note  I connected with  KAROLYNA BRISCOE on 05/08/21 at  3:15 PM EDT by telephone and verified that I am speaking with the correct  person using two identifiers.  Persons participating in the virtual visit: patient/Nurse Health Advisor   I discussed the limitations of performing an evaluation and  management service by telehealth. The patient expressed understanding and agreed to proceed. We continued and completed visit with audio only. Some vital signs may be absent or patient reported.   Hearing/Vision screen Hearing Screening - Comments:: Hearing aids Vision Screening - Comments:: Followed by [facility] Cataract extracted, bilateral Chronic dry eye; drops in use  Diabetic exam  No retinopathy reported  They have regular follow up with the ophthalmologist  Dietary issues and exercise activities discussed: Current Exercise Habits: Home exercise routine, Type of exercise: walking, Intensity: Mild Healthy diet Add fiber Good water intake   Goals Addressed               This Visit's Progress     Patient Stated     Maintain Healthy Lifestyle (pt-stated)        Eat healthy. Stay active; walk for exercise.        Depression Screen    05/08/2021    3:33 PM 02/15/2021    2:56 PM 09/04/2020    2:14 PM 06/22/2020    8:48 AM 05/14/2020    2:14 PM 04/30/2020   11:22 AM 04/28/2019   11:20 AM  PHQ 2/9 Scores  PHQ - 2 Score 0 0 3 2 3  0 0  PHQ- 9 Score   12 8 11       Fall Risk    05/08/2021    3:33 PM 02/15/2021    2:56 PM 11/15/2020   10:46 AM 09/04/2020    2:10 PM 06/28/2020   12:35 PM  Fall Risk   Falls in the past year? 0 0 0 0 0  Number falls in past yr: 0      Risk for fall due to :  No Fall Risks No Fall Risks    Follow up Falls evaluation completed Falls evaluation completed Falls evaluation completed Falls evaluation completed Falls evaluation completed    FALL RISK PREVENTION PERTAINING TO THE HOME: Home free of loose throw rugs in walkways, pet beds, electrical cords, etc? Yes  Adequate lighting in your home to reduce risk of falls? Yes   ASSISTIVE DEVICES UTILIZED TO PREVENT FALLS: Life alert? No  Use of a cane, walker or w/c? No   TIMED UP AND GO: Was the test performed? No .   Cognitive Function: Patient is alert and oriented x3.      04/28/2019   11:27 AM 03/23/2017    3:55 PM 03/21/2016    3:39 PM  MMSE - Mini Mental State Exam  Not completed: Unable to complete    Orientation to time  5 5  Orientation to Place  5 5  Registration  3 3  Attention/ Calculation  5 5  Recall  3 3  Language- name 2 objects  2 2  Language- repeat  1 1  Language- follow 3 step command  3 3  Language- read & follow direction  1 1  Write a sentence  1 1  Copy design  1 1  Total score  30 30        04/27/2018   11:27 AM  6CIT Screen  What Year? 0 points  What month? 0 points  What time? 0 points  Count back from 20 0 points  Months in reverse 0 points  Repeat phrase 0 points  Total Score 0 points  Immunizations Immunization History  Administered Date(s) Administered   Fluad Quad(high Dose 65+) 09/17/2018, 10/05/2019   Influenza, High Dose Seasonal PF 09/03/2015, 11/21/2016, 11/13/2017   Influenza,inj,Quad PF,6+ Mos 10/27/2012, 11/02/2013   Influenza-Unspecified 11/07/2014   PFIZER(Purple Top)SARS-COV-2 Vaccination 03/02/2019, 03/23/2019   Pneumococcal Conjugate-13 05/19/2014   Pneumococcal Polysaccharide-23 05/31/2015   Pneumococcal-Unspecified 12/07/2015   Tdap 10/27/2012   Shingrix Completed?: No.    Education has been provided regarding the importance of this vaccine. Patient has been advised to call insurance company to determine out of pocket expense if they have not yet received this vaccine. Advised may also receive vaccine at local pharmacy or Health Dept. Verbalized acceptance and understanding.  Screening Tests Health Maintenance  Topic Date Due   COVID-19 Vaccine (3 - Booster for Pfizer series) 05/24/2021 (Originally 05/18/2019)   Zoster Vaccines- Shingrix (1 of 2) 08/08/2021 (Originally 04/01/1999)   FOOT EXAM  05/14/2021   INFLUENZA VACCINE  08/06/2021   OPHTHALMOLOGY EXAM  08/09/2021   HEMOGLOBIN A1C  08/15/2021   TETANUS/TDAP  10/28/2022   MAMMOGRAM  04/16/2023   COLONOSCOPY (Pts 45-77yrs Insurance  coverage will need to be confirmed)  12/05/2024   Pneumonia Vaccine 57+ Years old  Completed   DEXA SCAN  Completed   Hepatitis C Screening  Completed   HPV VACCINES  Aged Out   Health Maintenance There are no preventive care reminders to display for this patient.  Lung Cancer Screening: (Low Dose CT Chest recommended if Age 43-80 years, 30 pack-year currently smoking OR have quit w/in 15years.) does not qualify.   Vision Screening: Recommended annual ophthalmology exams for early detection of glaucoma and other disorders of the eye.  Dental Screening: Recommended annual dental exams for proper oral hygiene  Community Resource Referral / Chronic Care Management: CRR required this visit?  No   CCM required this visit?  No      Plan:   Keep all routine maintenance appointments.   I have personally reviewed and noted the following in the patient's chart:   Medical and social history Use of alcohol, tobacco or illicit drugs  Current medications and supplements including opioid prescriptions. Not taking Ultram. Taking Fioricet, followed by PCP. Functional ability and status Nutritional status Physical activity Advanced directives List of other physicians Hospitalizations, surgeries, and ER visits in previous 12 months Vitals Screenings to include cognitive, depression, and falls Referrals and appointments  In addition, I have reviewed and discussed with patient certain preventive protocols, quality metrics, and best practice recommendations. A written personalized care plan for preventive services as well as general preventive health recommendations were provided to patient.     OBrien-Blaney, Shamaya Kauer L, LPN   01/11/1094      I have reviewed the above information and agree with above.   Duncan Dull, MD

## 2021-05-08 NOTE — Patient Instructions (Addendum)
?  Ms. Been , ?Thank you for taking time to come for your Medicare Wellness Visit. I appreciate your ongoing commitment to your health goals. Please review the following plan we discussed and let me know if I can assist you in the future.  ? ?These are the goals we discussed: ? Goals   ? ?  ? Patient Stated  ?   Maintain Healthy Lifestyle (pt-stated)   ?   Eat healthy. ?Stay active; walk for exercise. ? ?  ? ?  ?  ?This is a list of the screening recommended for you and due dates:  ?Health Maintenance  ?Topic Date Due  ? COVID-19 Vaccine (3 - Booster for Pfizer series) 05/24/2021*  ? Zoster (Shingles) Vaccine (1 of 2) 08/08/2021*  ? Complete foot exam   05/14/2021  ? Flu Shot  08/06/2021  ? Eye exam for diabetics  08/09/2021  ? Hemoglobin A1C  08/15/2021  ? Tetanus Vaccine  10/28/2022  ? Mammogram  04/16/2023  ? Colon Cancer Screening  12/05/2024  ? Pneumonia Vaccine  Completed  ? DEXA scan (bone density measurement)  Completed  ? Hepatitis C Screening: USPSTF Recommendation to screen - Ages 65-79 yo.  Completed  ? HPV Vaccine  Aged Out  ?*Topic was postponed. The date shown is not the original due date.  ?  ?

## 2021-05-09 ENCOUNTER — Other Ambulatory Visit (INDEPENDENT_AMBULATORY_CARE_PROVIDER_SITE_OTHER): Payer: PPO

## 2021-05-09 DIAGNOSIS — Z7901 Long term (current) use of anticoagulants: Secondary | ICD-10-CM

## 2021-05-09 LAB — PROTIME-INR
INR: 2.5 ratio — ABNORMAL HIGH (ref 0.8–1.0)
Prothrombin Time: 25.8 s — ABNORMAL HIGH (ref 9.6–13.1)

## 2021-05-10 ENCOUNTER — Other Ambulatory Visit: Payer: Self-pay | Admitting: Nurse Practitioner

## 2021-05-10 DIAGNOSIS — R933 Abnormal findings on diagnostic imaging of other parts of digestive tract: Secondary | ICD-10-CM

## 2021-05-13 ENCOUNTER — Ambulatory Visit: Payer: PPO | Admitting: Cardiology

## 2021-05-15 ENCOUNTER — Other Ambulatory Visit: Payer: Self-pay | Admitting: Internal Medicine

## 2021-05-15 DIAGNOSIS — E785 Hyperlipidemia, unspecified: Secondary | ICD-10-CM

## 2021-05-23 ENCOUNTER — Encounter: Payer: Self-pay | Admitting: Cardiology

## 2021-05-23 ENCOUNTER — Ambulatory Visit: Payer: PPO | Admitting: Cardiology

## 2021-05-23 VITALS — BP 130/62 | HR 55 | Ht 61.5 in | Wt 161.0 lb

## 2021-05-23 DIAGNOSIS — I10 Essential (primary) hypertension: Secondary | ICD-10-CM

## 2021-05-23 DIAGNOSIS — E7800 Pure hypercholesterolemia, unspecified: Secondary | ICD-10-CM

## 2021-05-23 DIAGNOSIS — Z01810 Encounter for preprocedural cardiovascular examination: Secondary | ICD-10-CM
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
## 2021-05-23 NOTE — Patient Instructions (Signed)
Medication Instructions:  ? ?Your physician recommends that you continue on your current medications as directed. Please refer to the Current Medication list given to you today. ? ?*If you need a refill on your cardiac medications before your next appointment, please call your pharmacy* ? ? ?Lab Work: ? ?None ordered ? ?If you have labs (blood work) drawn today and your tests are completely normal, you will receive your results only by: ?MyChart Message (if you have MyChart) OR ?A paper copy in the mail ?If you have any lab test that is abnormal or we need to change your treatment, we will call you to review the results. ? ? ?Testing/Procedures: ? ?None ordered ? ? ?Follow-Up: ?At [facility], you and your health needs are our priority.  As part of our continuing mission to provide you with exceptional heart care, we have created designated Provider Care Teams.  These Care Teams include your primary Cardiologist (physician) and Advanced Practice Providers (APPs -  Physician Assistants and Nurse Practitioners) who all work together to provide you with the care you need, when you need it. ? ?We recommend signing up for the patient portal called "MyChart".  Sign up information is provided on this After Visit Summary.  MyChart is used to connect with patients for Virtual Visits (Telemedicine).  Patients are able to view lab/test results, encounter notes, upcoming appointments, etc.  Non-urgent messages can be sent to your provider as well.   ?To learn more about what you can do with MyChart, go to [email or web address].   ? ?Your next appointment:   ? ?Follow up as needed  ? ?The format for your next appointment:   ?In Person ? ?Provider:   ?You may see Brian Agbor-Etang, MD or one of the following Advanced Practice Providers on your designated Care Team:   ?Christopher Berge, NP ?Ryan Dunn, PA-C ?Cadence Furth, PA-C  ? ? ?Other Instructions ? ? ?Important Information About Sugar ? ? ? ? ? ? ?

## 2021-06-06 ENCOUNTER — Other Ambulatory Visit: Payer: Self-pay | Admitting: Internal Medicine

## 2021-06-06 DIAGNOSIS — I10 Essential (primary) hypertension: Secondary | ICD-10-CM

## 2021-06-07 ENCOUNTER — Ambulatory Visit: Payer: PPO | Admitting: Cardiology

## 2021-06-07 DIAGNOSIS — S60562A Insect bite (nonvenomous) of left hand, initial encounter: Secondary | ICD-10-CM | POA: Diagnosis not present

## 2021-06-07 DIAGNOSIS — Z7901 Long term (current) use of anticoagulants: Secondary | ICD-10-CM | POA: Diagnosis not present

## 2021-06-07 DIAGNOSIS — W57XXXA Bitten or stung by nonvenomous insect and other nonvenomous arthropods, initial encounter: Secondary | ICD-10-CM | POA: Diagnosis not present

## 2021-06-07 DIAGNOSIS — S50862A Insect bite (nonvenomous) of left forearm, initial encounter: Secondary | ICD-10-CM | POA: Diagnosis not present

## 2021-06-07 DIAGNOSIS — L03114 Cellulitis of left upper limb: Secondary | ICD-10-CM | POA: Diagnosis not present

## 2021-06-13 DIAGNOSIS — R198 Other specified symptoms and signs involving the digestive system and abdomen: Secondary | ICD-10-CM | POA: Diagnosis not present

## 2021-06-28 ENCOUNTER — Other Ambulatory Visit (INDEPENDENT_AMBULATORY_CARE_PROVIDER_SITE_OTHER): Payer: PPO

## 2021-06-28 DIAGNOSIS — Z7901 Long term (current) use of anticoagulants: Secondary | ICD-10-CM

## 2021-06-28 LAB — PROTIME-INR
INR: 2.2 ratio — ABNORMAL HIGH (ref 0.8–1.0)
Prothrombin Time: 22.9 s — ABNORMAL HIGH (ref 9.6–13.1)

## 2021-06-29 ENCOUNTER — Encounter: Payer: Self-pay | Admitting: Internal Medicine

## 2021-06-29 DIAGNOSIS — Z9049 Acquired absence of other specified parts of digestive tract: Secondary | ICD-10-CM | POA: Insufficient documentation

## 2021-07-12 ENCOUNTER — Ambulatory Visit: Payer: PPO | Admitting: Anesthesiology

## 2021-07-12 ENCOUNTER — Ambulatory Visit
Admission: RE | Admit: 2021-07-12 | Discharge: 2021-07-12 | Disposition: A | Discharge status: Home / Self Care | Payer: PPO | Attending: Gastroenterology | Admitting: Gastroenterology

## 2021-07-12 ENCOUNTER — Encounter
Admission: RE | Disposition: A | Discharge status: Home / Self Care | Payer: Self-pay | Source: Home / Self Care | Attending: Gastroenterology

## 2021-07-12 ENCOUNTER — Encounter: Payer: Self-pay | Admitting: Gastroenterology

## 2021-07-12 DIAGNOSIS — K3189 Other diseases of stomach and duodenum: Secondary | ICD-10-CM | POA: Diagnosis not present

## 2021-07-12 DIAGNOSIS — K635 Polyp of colon: Secondary | ICD-10-CM | POA: Diagnosis not present

## 2021-07-12 DIAGNOSIS — E669 Obesity, unspecified: Secondary | ICD-10-CM | POA: Insufficient documentation

## 2021-07-12 DIAGNOSIS — E785 Hyperlipidemia, unspecified: Secondary | ICD-10-CM | POA: Insufficient documentation

## 2021-07-12 DIAGNOSIS — K6289 Other specified diseases of anus and rectum: Secondary | ICD-10-CM | POA: Diagnosis not present

## 2021-07-12 DIAGNOSIS — D120 Benign neoplasm of cecum: Secondary | ICD-10-CM | POA: Insufficient documentation

## 2021-07-12 DIAGNOSIS — I10 Essential (primary) hypertension: Secondary | ICD-10-CM | POA: Insufficient documentation

## 2021-07-12 DIAGNOSIS — K219 Gastro-esophageal reflux disease without esophagitis: Secondary | ICD-10-CM | POA: Diagnosis not present

## 2021-07-12 DIAGNOSIS — Z86010 Personal history of colonic polyps: Secondary | ICD-10-CM | POA: Diagnosis not present

## 2021-07-12 DIAGNOSIS — Z980 Intestinal bypass and anastomosis status: Secondary | ICD-10-CM | POA: Insufficient documentation

## 2021-07-12 DIAGNOSIS — Z9049 Acquired absence of other specified parts of digestive tract: Secondary | ICD-10-CM | POA: Diagnosis not present

## 2021-07-12 DIAGNOSIS — Z6827 Body mass index (BMI) 27.0-27.9, adult: Secondary | ICD-10-CM | POA: Diagnosis not present

## 2021-07-12 DIAGNOSIS — Z1211 Encounter for screening for malignant neoplasm of colon: Secondary | ICD-10-CM | POA: Insufficient documentation

## 2021-07-12 DIAGNOSIS — F32A Depression, unspecified: Secondary | ICD-10-CM | POA: Insufficient documentation

## 2021-07-12 DIAGNOSIS — H9190 Unspecified hearing loss, unspecified ear: Secondary | ICD-10-CM | POA: Diagnosis not present

## 2021-07-12 DIAGNOSIS — Z7901 Long term (current) use of anticoagulants: Secondary | ICD-10-CM | POA: Insufficient documentation

## 2021-07-12 DIAGNOSIS — D126 Benign neoplasm of colon, unspecified: Secondary | ICD-10-CM | POA: Diagnosis not present

## 2021-07-12 DIAGNOSIS — Z90710 Acquired absence of both cervix and uterus: Secondary | ICD-10-CM | POA: Diagnosis not present

## 2021-07-12 DIAGNOSIS — Z86711 Personal history of pulmonary embolism: Secondary | ICD-10-CM | POA: Insufficient documentation

## 2021-07-12 DIAGNOSIS — K317 Polyp of stomach and duodenum: Secondary | ICD-10-CM | POA: Diagnosis not present

## 2021-07-12 DIAGNOSIS — K2970 Gastritis, unspecified, without bleeding: Secondary | ICD-10-CM | POA: Diagnosis not present

## 2021-07-12 HISTORY — PX: ESOPHAGOGASTRODUODENOSCOPY (EGD) WITH PROPOFOL: SHX5813

## 2021-07-12 HISTORY — PX: COLONOSCOPY WITH PROPOFOL: SHX5780

## 2021-07-12 SURGERY — COLONOSCOPY WITH PROPOFOL
Anesthesia: General

## 2021-07-12 MED ORDER — LIDOCAINE HCL (CARDIAC) PF 100 MG/5ML IV SOSY
PREFILLED_SYRINGE | INTRAVENOUS | Status: DC | PRN
  Administered 2021-07-12: 80 mg via INTRAVENOUS

## 2021-07-12 MED ORDER — EPHEDRINE SULFATE (PRESSORS) 50 MG/ML IJ SOLN
INTRAMUSCULAR | Status: DC | PRN
  Administered 2021-07-12 (×2): 10 mg via INTRAVENOUS

## 2021-07-12 MED ORDER — PROPOFOL 10 MG/ML IV BOLUS
INTRAVENOUS | Status: DC | PRN
  Administered 2021-07-12: 30 mg via INTRAVENOUS
  Administered 2021-07-12: 40 mg via INTRAVENOUS
  Administered 2021-07-12: 30 mg via INTRAVENOUS
  Administered 2021-07-12: 40 mg via INTRAVENOUS
  Administered 2021-07-12: 100 mg via INTRAVENOUS
  Administered 2021-07-12: 40 mg via INTRAVENOUS
  Administered 2021-07-12: 30 mg via INTRAVENOUS

## 2021-07-12 MED ORDER — SODIUM CHLORIDE 0.9 % IV SOLN: INTRAVENOUS | Status: DC
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
## 2021-07-12 NOTE — Op Note (Signed)
[facility]astroenter[facility] Patient Name: [name] Procedure Date: 07/12/2021 8:18 AM MRN: [id number] Account #: [id number] Date of Birth: Mar 09, 1950 Admit Type: Outpatient Age: 71 Room: [facility] ENDO ROOM 3 Gender: Female Note Status: Finalized Instrument Name: Upper Endoscope [id number] Procedure:             Upper GI endoscopy Indications:           Dyspepsia Providers:             Andrey Farmer MD, MD Referring MD:          Andrey Farmer MD, MD (Referring MD), Deborra Medina,                         MD (Referring MD) Medicines:             Monitored Anesthesia Care Complications:         No immediate complications. Estimated blood loss:                         Minimal. Procedure:             Pre-Anesthesia Assessment:                        - Prior to the procedure, a History and Physical was                         performed, and patient medications and allergies were                         reviewed. The patient is competent. The risks and                         benefits of the procedure and the sedation options and                         risks were discussed with the patient. All questions                         were answered and informed consent was obtained.                         Patient identification and proposed procedure were                         verified by the physician, the nurse, the                         anesthesiologist, the anesthetist and the technician                         in the endoscopy suite. Mental Status Examination:                         alert and oriented. Airway Examination: normal                         oropharyngeal airway and neck mobility. Respiratory  Examination: clear to auscultation. CV Examination:                         normal. Prophylactic Antibiotics: The patient does not                         require prophylactic antibiotics. Prior                         Anticoagulants: The  patient has taken Coumadin                         (warfarin), last dose was 4 days prior to procedure.                         ASA Grade Assessment: III - A patient with severe                         systemic disease. After reviewing the risks and                         benefits, the patient was deemed in satisfactory                         condition to undergo the procedure. The anesthesia                         plan was to use monitored anesthesia care (MAC).                         Immediately prior to administration of medications,                         the patient was re-assessed for adequacy to receive                         sedatives. The heart rate, respiratory rate, oxygen                         saturations, blood pressure, adequacy of pulmonary                         ventilation, and response to care were monitored                         throughout the procedure. The physical status of the                         patient was re-assessed after the procedure.                        After obtaining informed consent, the endoscope was                         passed under direct vision. Throughout the procedure,                         the patient's blood pressure, pulse, and oxygen  saturations were monitored continuously. The Endoscope                         was introduced through the mouth, and advanced to the                         second part of duodenum. The upper GI endoscopy was                         accomplished without difficulty. The patient tolerated                         the procedure well. Findings:      The examined esophagus was normal.      Patchy mild inflammation was found in the gastric antrum. Biopsies were       taken with a cold forceps for histology. Gastric mapping was performed       by taking multiple biopsies of lesser and greater curver including       angularis in antrum in one jar and then biopsies of the lesser  and       greater curve in the body. Estimated blood loss was minimal.      Localized mildly erythematous mucosa without active bleeding and with no       stigmata of bleeding was found in the duodenal bulb and in the first       portion of the duodenum. Biopsies were taken with a cold forceps for       histology. Estimated blood loss was minimal. Impression:            - Normal esophagus.                        - Gastritis. Biopsied.                        - Erythematous duodenopathy. Biopsied. Recommendation:        - Await pathology results.                        - Perform a colonoscopy today. Procedure Code(s):     --- Professional ---                        [phone], Esophagogastroduodenoscopy, flexible,                         transoral; with biopsy, single or multiple Diagnosis Code(s):     --- Professional ---                        K29.70, Gastritis, unspecified, without bleeding                        K31.89, Other diseases of stomach and duodenum                        R10.13, Epigastric pain CPT copyright 2019 American Medical Association. All rights reserved. The codes documented in this report are preliminary and upon coder review may  be revised to meet current compliance requirements. Andrey Farmer MD, MD 07/12/2021 8:57:04 AM Number of Addenda: 0 Note Initiated On: 07/12/2021  8:18 AM Estimated Blood Loss:  Estimated blood loss was minimal.      [facility]

## 2021-07-12 NOTE — Interval H&P Note (Signed)
History and Physical Interval Note:  07/12/2021 8:27 AM  [name]  has presented today for surgery, with the diagnosis of Alternating Constipation and diarrhea.  The various methods of treatment have been discussed with the patient and family. After consideration of risks, benefits and other options for treatment, the patient has consented to  Procedure(s): COLONOSCOPY WITH PROPOFOL (N/A) ESOPHAGOGASTRODUODENOSCOPY (EGD) WITH PROPOFOL (N/A) as a surgical intervention.  The patient's history has been reviewed, patient examined, no change in status, stable for surgery.  I have reviewed the patient's chart and labs.  Questions were answered to the patient's satisfaction.     Lesly Rubenstein  Ok to proceed with EGD/Colonoscopy
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
## 2021-07-12 NOTE — Transfer of Care (Signed)
Immediate Anesthesia Transfer of Care Note  Patient: [name]  Procedure(s) Performed: COLONOSCOPY WITH PROPOFOL ESOPHAGOGASTRODUODENOSCOPY (EGD) WITH PROPOFOL  Patient Location: Endoscopy Unit  Anesthesia Type:General  Level of Consciousness: drowsy  Airway & Oxygen Therapy: Patient Spontanous Breathing and Patient connected to nasal cannula oxygen  Post-op Assessment: Report given to RN, Post -op Vital signs reviewed and stable and Patient moving all extremities  Post vital signs: Reviewed and stable  Last Vitals:  Vitals Value Taken Time  BP 113/46 07/12/21 0855  Temp 36.1 C 07/12/21 0855  Pulse 57 07/12/21 0855  Resp 12 07/12/21 0855  SpO2 96 % 07/12/21 0855    Last Pain:  Vitals:   07/12/21 0855  TempSrc: Temporal  PainSc: Asleep         Complications: No notable events documented.

## 2021-07-12 NOTE — Anesthesia Postprocedure Evaluation (Signed)
Anesthesia Post Note  Patient: [name]  Procedure(s) Performed: COLONOSCOPY WITH PROPOFOL ESOPHAGOGASTRODUODENOSCOPY (EGD) WITH PROPOFOL  Patient location during evaluation: PACU Anesthesia Type: General Level of consciousness: awake and awake and alert Pain management: pain level controlled Vital Signs Assessment: post-procedure vital signs reviewed and stable Respiratory status: nonlabored ventilation and respiratory function stable Cardiovascular status: stable Anesthetic complications: no   No notable events documented.   Last Vitals:  Vitals:   07/12/21 0905 07/12/21 0915  BP: (!) 117/58 (!) 142/56  Pulse: (!) 59 (!) 56  Resp: 19 14  Temp:    SpO2: 98% 97%    Last Pain:  Vitals:   07/12/21 0915  TempSrc:   PainSc: 0-No pain                 VAN STAVEREN,Archibald Marchetta

## 2021-07-12 NOTE — Op Note (Addendum)
[facility] Patient Name: [name] Procedure Date: 07/12/2021 8:13 AM MRN: [id number] Account #: [id number] Date of Birth: December 25, 1950 Admit Type: Outpatient Age: 71 Room: [facility] ENDO ROOM 3 Gender: Female Note Status: Supervisor Override Instrument Name: Jasper Riling [id number] Procedure:             Colonoscopy Indications:           High risk colon cancer surveillance: Personal history                         of adenoma (10 mm or greater in size), Last                         colonoscopy: 2016, Alternating constipation and                         diarrhea Providers:             Andrey Farmer MD, MD Referring MD:          Andrey Farmer MD, MD (Referring MD), Deborra Medina,                         MD (Referring MD) Medicines:             Monitored Anesthesia Care Complications:         No immediate complications. Estimated blood loss:                         Minimal. Procedure:             Pre-Anesthesia Assessment:                        - Prior to the procedure, a History and Physical was                         performed, and patient medications and allergies were                         reviewed. The patient is competent. The risks and                         benefits of the procedure and the sedation options and                         risks were discussed with the patient. All questions                         were answered and informed consent was obtained.                         Patient identification and proposed procedure were                         verified by the physician, the nurse, the                         anesthesiologist, the anesthetist and the technician  in the endoscopy suite. Mental Status Examination:                         alert and oriented. Airway Examination: normal                         oropharyngeal airway and neck mobility. Respiratory                         Examination: clear to  auscultation. CV Examination:                         normal. Prophylactic Antibiotics: The patient does not                         require prophylactic antibiotics. Prior                         Anticoagulants: The patient has taken Coumadin                         (warfarin), last dose was 4 days prior to procedure.                         ASA Grade Assessment: III - A patient with severe                         systemic disease. After reviewing the risks and                         benefits, the patient was deemed in satisfactory                         condition to undergo the procedure. The anesthesia                         plan was to use monitored anesthesia care (MAC).                         Immediately prior to administration of medications,                         the patient was re-assessed for adequacy to receive                         sedatives. The heart rate, respiratory rate, oxygen                         saturations, blood pressure, adequacy of pulmonary                         ventilation, and response to care were monitored                         throughout the procedure. The physical status of the                         patient was re-assessed after the procedure.  After obtaining informed consent, the colonoscope was                         passed under direct vision. Throughout the procedure,                         the patient's blood pressure, pulse, and oxygen                         saturations were monitored continuously. The                         Colonoscope was introduced through the anus and                         advanced to the the cecum, identified by appendiceal                         orifice and ileocecal valve. The colonoscopy was                         performed without difficulty. The patient tolerated                         the procedure well. The quality of the bowel                         preparation was  good. Findings:      The perianal and digital rectal examinations were normal.      A 3 mm polyp was found in the cecum. The polyp was sessile. The polyp       was removed with a cold snare. Resection and retrieval were complete. To       prevent bleeding post-intervention given coumadin use 4 days ago, one       hemostatic clip was successfully placed. There was no bleeding at the       end of the procedure.      There was evidence of a prior end-to-end colo-colonic anastomosis in the       sigmoid colon. This was patent.      Anal papilla(e) were hypertrophied.      The exam was otherwise without abnormality on direct and retroflexion       views. Impression:            - One 3 mm polyp in the cecum, removed with a cold                         snare. Resected and retrieved. Clip was placed.                        - Patent end-to-end colo-colonic anastomosis.                        - Anal papilla(e) were hypertrophied.                        - The examination was otherwise normal on direct and  retroflexion views. Recommendation:        - Discharge patient to home.                        - Resume previous diet.                        - Continue present medications.                        - Resume Coumadin (warfarin) at prior dose today.                        - Await pathology results.                        - Repeat colonoscopy for surveillance based on                         pathology results.                        - Return to referring physician as previously                         scheduled. Procedure Code(s):     --- Professional ---                        [phone], Colonoscopy, flexible; with removal of                         tumor(s), polyp(s), or other lesion(s) by snare                         technique Diagnosis Code(s):     --- Professional ---                        Z86.010, Personal history of colonic polyps                        K63.5, Polyp  of colon                        Z98.0, Intestinal bypass and anastomosis status                        K62.89, Other specified diseases of anus and rectum CPT copyright 2019 American [employer] Association. All rights reserved. The codes documented in this report are preliminary and upon coder review may  be revised to meet current compliance requirements. Andrey Farmer MD, MD 07/12/2021 9:02:57 AM Number of Addenda: 0 Note Initiated On: 07/12/2021 8:13 AM Scope Withdrawal Time: 0 hours 7 minutes 59 seconds  Total Procedure Duration: 0 hours 9 minutes 20 seconds  Estimated Blood Loss:  Estimated blood loss was minimal.      [facility]

## 2021-07-15 ENCOUNTER — Other Ambulatory Visit: Payer: Self-pay | Admitting: Internal Medicine

## 2021-07-15 DIAGNOSIS — F409 Phobic anxiety disorder, unspecified: Secondary | ICD-10-CM

## 2021-07-15 LAB — SURGICAL PATHOLOGY

## 2021-07-15 LAB — HM COLONOSCOPY

## 2021-07-15 MED ORDER — ALPRAZOLAM 0.25 MG PO TABS: 0.2500 mg | ORAL_TABLET | Freq: Every evening | ORAL | 1 refills | Status: DC | PRN

## 2021-07-22 ENCOUNTER — Other Ambulatory Visit: Payer: Self-pay | Admitting: Internal Medicine

## 2021-07-22 DIAGNOSIS — Z7901 Long term (current) use of anticoagulants: Secondary | ICD-10-CM

## 2021-07-22 MED ORDER — WARFARIN SODIUM 6 MG PO TABS: 6.0000 mg | ORAL_TABLET | Freq: Every day | ORAL | 0 refills | Status: DC

## 2021-07-22 NOTE — Telephone Encounter (Signed)
LMTCB. Need to find out what dose of coumadin pt is currently taking.

## 2021-07-23 ENCOUNTER — Telehealth: Payer: Self-pay

## 2021-07-23 NOTE — Telephone Encounter (Signed)
Spoke with the Pharmacy at [facility]. To give them permission to change the manufacturer on the Patient's Coumadin.

## 2021-07-23 NOTE — Telephone Encounter (Signed)
Attempted to call Patient-no answer or voicemail 

## 2021-07-23 NOTE — Telephone Encounter (Signed)
I spoke with patient and she was picking up her mother from [facility]. She will call back to schedule follow-up appointment that is due.

## 2021-07-23 NOTE — Telephone Encounter (Signed)
Received a fax from [facility] stating that the [place] for her [name] is not available and would like to know if it is okay to change manufacturer.

## 2021-07-24 NOTE — Telephone Encounter (Signed)
Spoke to Patient to let her know that Dr. Derrel Nip approved [facility] to change her manufacturer on the Coumadin and let her know that when she receives the medication and starts taking it she will need to call and schedule an INR recheck for 2 weeks after starting the medication. Patient verbalized understanding.

## 2021-08-06 ENCOUNTER — Ambulatory Visit
Admission: RE | Admit: 2021-08-06 | Discharge: 2021-08-06 | Disposition: A | Discharge status: Home / Self Care | Payer: PPO | Source: Ambulatory Visit | Attending: Nurse Practitioner | Admitting: Nurse Practitioner

## 2021-08-06 DIAGNOSIS — Z86010 Personal history of colonic polyps: Secondary | ICD-10-CM | POA: Diagnosis not present

## 2021-08-06 DIAGNOSIS — N281 Cyst of kidney, acquired: Secondary | ICD-10-CM | POA: Diagnosis not present

## 2021-08-06 DIAGNOSIS — R1013 Epigastric pain: Secondary | ICD-10-CM | POA: Diagnosis not present

## 2021-08-06 DIAGNOSIS — R933 Abnormal findings on diagnostic imaging of other parts of digestive tract: Secondary | ICD-10-CM | POA: Diagnosis not present

## 2021-08-06 DIAGNOSIS — D7389 Other diseases of spleen: Secondary | ICD-10-CM | POA: Diagnosis not present

## 2021-08-06 DIAGNOSIS — R198 Other specified symptoms and signs involving the digestive system and abdomen: Secondary | ICD-10-CM | POA: Diagnosis not present

## 2021-08-06 DIAGNOSIS — R1084 Generalized abdominal pain: Secondary | ICD-10-CM | POA: Diagnosis not present

## 2021-08-06 LAB — POCT I-STAT CREATININE: Creatinine, Ser: 0.9 mg/dL (ref 0.44–1.00)

## 2021-08-06 MED ORDER — IOHEXOL 300 MG/ML  SOLN
100.0000 mL | Freq: Once | INTRAMUSCULAR | Status: AC | PRN
  Administered 2021-08-06: 100 mL via INTRAVENOUS

## 2021-08-12 ENCOUNTER — Other Ambulatory Visit: Payer: PPO

## 2021-08-13 ENCOUNTER — Telehealth: Payer: Self-pay

## 2021-08-13 ENCOUNTER — Telehealth: Payer: Self-pay | Admitting: Internal Medicine

## 2021-08-13 DIAGNOSIS — Z7901 Long term (current) use of anticoagulants: Secondary | ICD-10-CM

## 2021-08-13 NOTE — Telephone Encounter (Addendum)
Called pt to schedule her a lab appt for an PT/INR. First thing the pt said to me was that she is very upset with our office. Pt stated that she called yesterday to schedule an appt for a lab she was offered two different appt times for today 08/13/2021 one being at 10:00 and the other being at 2:00. Pt stated that she she told the person on the phone that she would like the appt at 10 am. Pt stated she came in for her appt today and was told by the front office staff member that sits on the right hand side to the pt, that she did not have an appt today that it was scheduled for yesterday, 08/12/2021, at 2pm. Pt stated that the front office member was very rude and told her that it was the front office manager that she spoke with yesterday and that was who scheduled the appt. Pt was very upset and walked out. Pt stated that she called back up to schedule an appt later today and was told that they could not schedule her an appt because there was no lab orders in the chart. The pt has a standing order in the chart to have her PT/INR drawn once a month. Pt stated that she was also told by this same staff that she had not had any labs done since last year. I scheduled the pt's lab appt and apologized to pt letting her know that I would let our office manager know about her concerns.

## 2021-08-13 NOTE — Telephone Encounter (Signed)
Pt would like to be called regarding her INR appointment

## 2021-08-13 NOTE — Telephone Encounter (Signed)
Appt has been scheduled.

## 2021-08-14 ENCOUNTER — Other Ambulatory Visit (INDEPENDENT_AMBULATORY_CARE_PROVIDER_SITE_OTHER): Payer: PPO

## 2021-08-14 DIAGNOSIS — Z7901 Long term (current) use of anticoagulants: Secondary | ICD-10-CM

## 2021-08-14 NOTE — Addendum Note (Signed)
Addended by: Leeanne Rio on: 08/14/2021 02:37 PM   Modules accepted: Orders

## 2021-08-15 ENCOUNTER — Other Ambulatory Visit: Payer: Self-pay

## 2021-08-15 DIAGNOSIS — Z7901 Long term (current) use of anticoagulants: Secondary | ICD-10-CM

## 2021-08-15 LAB — PROTIME-INR
INR: 3.1 — ABNORMAL HIGH
Prothrombin Time: 30.9 s — ABNORMAL HIGH (ref 9.0–11.5)

## 2021-08-16 ENCOUNTER — Other Ambulatory Visit: Payer: Self-pay | Admitting: Internal Medicine

## 2021-08-19 ENCOUNTER — Other Ambulatory Visit: Payer: PPO

## 2021-08-21 ENCOUNTER — Other Ambulatory Visit (INDEPENDENT_AMBULATORY_CARE_PROVIDER_SITE_OTHER): Payer: PPO

## 2021-08-21 DIAGNOSIS — Z7901 Long term (current) use of anticoagulants: Secondary | ICD-10-CM

## 2021-08-21 LAB — PROTIME-INR
INR: 2.3 ratio — ABNORMAL HIGH (ref 0.8–1.0)
Prothrombin Time: 24.1 s — ABNORMAL HIGH (ref 9.6–13.1)

## 2021-08-23 ENCOUNTER — Other Ambulatory Visit: Payer: Self-pay | Admitting: Internal Medicine

## 2021-08-23 DIAGNOSIS — E1169 Type 2 diabetes mellitus with other specified complication: Secondary | ICD-10-CM

## 2021-08-26 DIAGNOSIS — R1084 Generalized abdominal pain: Secondary | ICD-10-CM | POA: Diagnosis not present

## 2021-08-26 DIAGNOSIS — R140 Abdominal distension (gaseous): Secondary | ICD-10-CM | POA: Diagnosis not present

## 2021-08-26 DIAGNOSIS — R198 Other specified symptoms and signs involving the digestive system and abdomen: Secondary | ICD-10-CM | POA: Diagnosis not present

## 2021-09-03 ENCOUNTER — Other Ambulatory Visit: Payer: Self-pay | Admitting: Internal Medicine

## 2021-09-03 DIAGNOSIS — I10 Essential (primary) hypertension: Secondary | ICD-10-CM

## 2021-09-12 ENCOUNTER — Telehealth: Payer: Self-pay | Admitting: Internal Medicine

## 2021-09-12 NOTE — Telephone Encounter (Signed)
Spoke with pt and she has been scheduled with Joycelyn Schmid, NP tomorrow. Pt stated that her stomach pain is "shifting". Pt stated that this has been going on since November has seen GI, found out she was positive for SIBO, was prescribed Amoxicillin. She is frustrated that she has not gotten any answers other than the positive SIBO.

## 2021-09-12 NOTE — Telephone Encounter (Signed)
Pt called stating she is having stomach pain and sibo. Sent to access nurse

## 2021-09-16 ENCOUNTER — Ambulatory Visit: Payer: PPO | Admitting: Family

## 2021-09-16 ENCOUNTER — Ambulatory Visit
Admission: RE | Admit: 2021-09-16 | Discharge: 2021-09-16 | Disposition: A | Discharge status: Home / Self Care | Payer: PPO | Source: Ambulatory Visit | Attending: Family | Admitting: Family

## 2021-09-16 ENCOUNTER — Ambulatory Visit (INDEPENDENT_AMBULATORY_CARE_PROVIDER_SITE_OTHER): Payer: PPO | Admitting: Family

## 2021-09-16 ENCOUNTER — Encounter: Payer: Self-pay | Admitting: Family

## 2021-09-16 VITALS — BP 134/80 | HR 58 | Temp 98.4°F | Ht 61.0 in | Wt 158.8 lb

## 2021-09-16 DIAGNOSIS — R109 Unspecified abdominal pain: Secondary | ICD-10-CM | POA: Insufficient documentation

## 2021-09-16 DIAGNOSIS — R140 Abdominal distension (gaseous): Secondary | ICD-10-CM

## 2021-09-16 NOTE — Assessment & Plan Note (Addendum)
Reviewed note by Dawson Bills, 08/06/21. Completed augmentin. She didn't take rifaximin at that time.  SIBO returned 09/05/21 with methane. Dawson Bills prescribed Xifaxan '550mg'$  TID x 14 days on 09/05/21. Compliant with benefiber, half dose miralax daily, bentyl '10mg'$  TID Compliant with b12 IM sq q30d FODMAP diet, probiotics.  Consulted with Dawson Bills

## 2021-09-16 NOTE — Patient Instructions (Signed)
As discussed, we are casting somewhat broad net o further evaluate your abdominal pain including urine studies, ultrasound of kidney bladder due to left renal cyst seen on CT abdomen and pelvis 08/06/2021.    I am also screening for celiac disease, urine.     I will reach out to Dawson Bills and consult with her in regards to antibiotic therapy.   Please let me know how you are doing and we will stay in close touch.
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
## 2021-09-17 ENCOUNTER — Telehealth: Payer: Self-pay | Admitting: Family

## 2021-09-17 LAB — URINE CULTURE
MICRO NUMBER:: 13899125
SPECIMEN QUALITY:: ADEQUATE

## 2021-09-17 LAB — CA 125: CA 125: 9 U/mL (ref <35)

## 2021-09-17 NOTE — Telephone Encounter (Signed)
Called Radiology and spoke with Levette she stated that it had not been read as of 09/17/21 but she would call [facility] and have them fax it to [place]

## 2021-09-17 NOTE — Telephone Encounter (Signed)
[name],   Pleasure speaking with you yesterday Do you have any updates with PA for rifaxamin?   I am still waiting on Ab [name],  Would you radiology and see what the status of Ab XR read is?   Radiology phone : [phone]

## 2021-09-18 LAB — CELIAC DISEASE AB SCREEN W/RFX
Antigliadin Abs, IgA: 2 units (ref 0–19)
IgA/Immunoglobulin A, Serum: 126 mg/dL (ref 64–422)
Transglutaminase IgA: <2 U/mL (ref 0–3)

## 2021-09-18 NOTE — Telephone Encounter (Signed)
close

## 2021-09-20 ENCOUNTER — Other Ambulatory Visit: Payer: Self-pay

## 2021-09-20 ENCOUNTER — Telehealth: Payer: Self-pay | Admitting: Family

## 2021-09-20 DIAGNOSIS — K5900 Constipation, unspecified: Secondary | ICD-10-CM | POA: Diagnosis not present

## 2021-09-20 DIAGNOSIS — K6389 Other specified diseases of intestine: Secondary | ICD-10-CM | POA: Diagnosis not present

## 2021-09-20 DIAGNOSIS — F4321 Adjustment disorder with depressed mood: Secondary | ICD-10-CM | POA: Diagnosis not present

## 2021-09-20 DIAGNOSIS — R1084 Generalized abdominal pain: Secondary | ICD-10-CM | POA: Diagnosis not present

## 2021-09-20 DIAGNOSIS — R140 Abdominal distension (gaseous): Secondary | ICD-10-CM | POA: Diagnosis not present

## 2021-09-20 LAB — URINALYSIS, ROUTINE W REFLEX MICROSCOPIC
Bilirubin Urine: NEGATIVE
Hgb urine dipstick: NEGATIVE
Ketones, ur: NEGATIVE
Nitrite: NEGATIVE
Specific Gravity, Urine: 1.01 (ref 1.000–1.030)
Total Protein, Urine: NEGATIVE
Urine Glucose: NEGATIVE
Urobilinogen, UA: 0.2 (ref 0.0–1.0)
pH: 6 (ref 5.0–8.0)

## 2021-09-20 NOTE — Telephone Encounter (Signed)
Spoke with pt and scheduled her for the only available appt next week and that is Friday at 4:30. If this is not okay please let me know and I will try to move her. Pt only wants to talk with you about this.

## 2021-09-20 NOTE — Telephone Encounter (Signed)
FYI Zettie Pho,   I received a call from Dawson Bills, NP at [facility] who expressed concern with patient's grief after loss of husband.  She has given patient phone number through hospice grief counseling  Would you arrange f/u with Dr Derrel Nip?  I am happy to see her as well regarding grief if I have a sooner appointment.

## 2021-09-23 NOTE — Telephone Encounter (Signed)
noted 

## 2021-09-23 NOTE — Telephone Encounter (Signed)
Attempt to call pt. No answer no voicemail.

## 2021-09-24 ENCOUNTER — Telehealth: Payer: Self-pay | Admitting: Family

## 2021-09-24 ENCOUNTER — Encounter: Payer: Self-pay | Admitting: Family

## 2021-09-24 NOTE — Telephone Encounter (Signed)
Called patient and spoke to patient IN DETAIL about the notes and I explained everything to her and she stated that she was going to go in [place] and read it for herself and if she has any questions about it then she would give [place] a call back here at the office

## 2021-09-24 NOTE — Telephone Encounter (Signed)
Call pt I sent her a mychart message regarding one month pause of coumadin and taking eliquis. There is a lot of detail in message  Please ask pt to read and let me know her thoughts

## 2021-09-24 NOTE — Telephone Encounter (Signed)
-----   Message from Letta Median, [facility] sent at 09/20/2021  5:31 PM EDT ----- Regarding: RE: Coumadin Interaction We have a free coupon here at the pharmacy. Will this start this today, the weekend or Monday? We are not open over the weekend. If she would like to get this at her regular pharmacy I can send the coupon info. ----- Message ----- From: Crecencio Mc, MD Sent: 09/20/2021   5:22 PM EDT To: Burnard Hawthorne, FNP; Letta Median, [facility] Subject: RE: Coumadin Interaction                       If we can get her Eliquis for 30 days for free I think that would be great.   ----- Message ----- From: Letta Median, [facility] Sent: 09/20/2021   2:39 PM EDT To: Crecencio Mc, MD; Marval Regal, NP; # Subject: RE: Coumadin Interaction                       I think with either therapy I would check an INR halfway through and again at the end of therapy and repeat until INR in range. I would counsel patient about increased bleeding risk and what to look for. I like Bactrim better for her age but do not like the greater risk of increased bleeding with Bactrim. According to clinical key it shows that cipro and coumadin might not be the best choice for the elderly. A catch 22!  Thoughts: For the [facility], have you mentioned the drug interactions with the other therapies as a need for the Xifaxin? What about changing warfarin to Eliquis for 30 days? There is a 30-day free trial coupon?   ----- Message ----- From: Burnard Hawthorne, FNP Sent: 09/20/2021   1:32 PM EDT To: Crecencio Mc, MD; Marval Regal, NP; # Subject: Coumadin Interaction                           Kristie and Dr Derrel Nip,   I wanted to consult with you both about safely treating patient for small intestinal bacterial overgrowth ( SIBO) as diagnosed by Dallie Dad, NP at [place].   Kim called me today as I had seen patient earlier this week for abdominal pain/constipation.   Brief history, patient was treated with  Augmentin all in August for SIBO and symptoms persisted.  She has no antibiotic drug allergies.  No kidney disease  Upon retesting patient still methane positive.  Maudie Mercury has been trying , and unsuccessful , at  getting Xifaximin approved through insurance. First choice for treatment is Xifaximin.    Alternatives would be preferred for Kim including metronidazole and then bactrim both of which have interaction with coumadin.   When I look up both , recommend to monitor INR.  We could monitor INR halfway through treatment  Doxycycline and ciprofloxacin are also per up-to-date reasonable alternatives.  Is there an antibiotic above that would be preferable and safest while patient is on coumadin?    Joycelyn Schmid

## 2021-09-25 ENCOUNTER — Telehealth: Payer: Self-pay | Admitting: Family

## 2021-09-25 ENCOUNTER — Other Ambulatory Visit: Payer: Self-pay | Admitting: Family

## 2021-09-25 ENCOUNTER — Encounter: Payer: Self-pay | Admitting: Family

## 2021-09-25 ENCOUNTER — Other Ambulatory Visit: Payer: Self-pay

## 2021-09-25 DIAGNOSIS — K6389 Other specified diseases of intestine: Secondary | ICD-10-CM

## 2021-09-25 DIAGNOSIS — N289 Disorder of kidney and ureter, unspecified: Secondary | ICD-10-CM

## 2021-09-25 DIAGNOSIS — R109 Unspecified abdominal pain: Secondary | ICD-10-CM

## 2021-09-25 DIAGNOSIS — R140 Abdominal distension (gaseous): Secondary | ICD-10-CM

## 2021-09-25 DIAGNOSIS — Z7901 Long term (current) use of anticoagulants: Secondary | ICD-10-CM

## 2021-09-25 DIAGNOSIS — R8290 Unspecified abnormal findings in urine: Secondary | ICD-10-CM

## 2021-09-25 DIAGNOSIS — R1030 Lower abdominal pain, unspecified: Secondary | ICD-10-CM

## 2021-09-25 MED ORDER — SULFAMETHOXAZOLE-TRIMETHOPRIM 800-160 MG PO TABS
1.0000 | ORAL_TABLET | Freq: Two times a day (BID) | ORAL | 0 refills | Status: DC
  Filled 2021-09-25: qty 28, 14d supply, fill #0

## 2021-09-25 MED ORDER — APIXABAN 5 MG PO TABS
5.0000 mg | ORAL_TABLET | Freq: Two times a day (BID) | ORAL | 1 refills | Status: DC
  Filled 2021-09-25: qty 60, 30d supply, fill #0

## 2021-09-25 NOTE — Telephone Encounter (Signed)
Called , left message Sent mychart

## 2021-09-25 NOTE — Telephone Encounter (Signed)
----- Message from Crecencio Mc, MD sent at 09/20/2021  5:27 PM EDT ----- Regarding: RE: Coumadin Interaction Not sure how to proceed.  It sounds like the Xifaxin is not possible due to cost,  so Septra and Eliquis would be the alternative?  I agree if so ----- Message ----- From: Burnard Hawthorne, FNP Sent: 09/20/2021   3:01 PM EDT To: Crecencio Mc, MD; Marval Regal, NP; # Subject: RE: Coumadin Interaction                       Thanks Drue Dun.  Per Maudie Mercury,  antibiotic course is 14 days.   she has retried PA for xifaxin and associated with IBS as per recommendation from Dr. Virgina Jock. I advised her to add drug interactions elderly as well. She just told me that it would be $700 unless there is a coupon that we are unaware of.  Do you have a preference with either Bactrim or Cipro or is safety the similar for bleeding for either as long as we monitor INR?  Unless Dr Derrel Nip has other recommendation or changing from Coumadin. ----- Message ----- From: Letta Median, [place]: 09/20/2021   2:39 PM EDT To: Crecencio Mc, MD; Marval Regal, NP; # Subject: RE: Coumadin Interaction                       I think with either therapy I would check an INR halfway through and again at the end of therapy and repeat until INR in range. I would counsel patient about increased bleeding risk and what to look for. I like Bactrim better for her age but do not like the greater risk of increased bleeding with Bactrim. According to clinical key it shows that cipro and coumadin might not be the best choice for the elderly. A catch 22!  Thoughts: For the [facility], have you mentioned the drug interactions with the other therapies as a need for the Xifaxin? What about changing warfarin to Eliquis for 30 days? There is a 30-day free trial coupon?   ----- Message ----- From: Burnard Hawthorne, FNP Sent: 09/20/2021   1:32 PM EDT To: Crecencio Mc, MD; Marval Regal, NP; # Subject: Coumadin Interaction                            Kristie and Dr Derrel Nip,   I wanted to consult with you both about safely treating patient for small intestinal bacterial overgrowth ( SIBO) as diagnosed by Dallie Dad, NP at [place].   Kim called me today as I had seen patient earlier this week for abdominal pain/constipation.   Brief history, patient was treated with Augmentin all in August for SIBO and symptoms persisted.  She has no antibiotic drug allergies.  No kidney disease  Upon retesting patient still methane positive.  Maudie Mercury has been trying , and unsuccessful , at  getting Xifaximin approved through insurance. First choice for treatment is Xifaximin.    Alternatives would be preferred for Kim including metronidazole and then bactrim both of which have interaction with coumadin.   When I look up both , recommend to monitor INR.  We could monitor INR halfway through treatment  Doxycycline and ciprofloxacin are also per up-to-date reasonable alternatives.  Is there an antibiotic above that would be preferable and safest while patient is on coumadin?    Joycelyn Schmid

## 2021-09-25 NOTE — Telephone Encounter (Signed)
Noted Discussed in person with Dr Derrel Nip as well We agreed eliquis '5mg'$  bid d/t PE secondary to Protein c& S deficiency, Lupus ACA See mychart message

## 2021-09-26 ENCOUNTER — Ambulatory Visit
Admission: RE | Admit: 2021-09-26 | Discharge: 2021-09-26 | Disposition: A | Discharge status: Home / Self Care | Payer: PPO | Source: Ambulatory Visit | Attending: Family | Admitting: Family

## 2021-09-26 DIAGNOSIS — R109 Unspecified abdominal pain: Secondary | ICD-10-CM | POA: Diagnosis not present

## 2021-09-26 DIAGNOSIS — N289 Disorder of kidney and ureter, unspecified: Secondary | ICD-10-CM | POA: Diagnosis not present

## 2021-09-27 ENCOUNTER — Encounter: Payer: Self-pay | Admitting: Internal Medicine

## 2021-09-27 ENCOUNTER — Ambulatory Visit (INDEPENDENT_AMBULATORY_CARE_PROVIDER_SITE_OTHER): Payer: PPO | Admitting: Internal Medicine

## 2021-09-27 VITALS — BP 136/68 | HR 58 | Temp 98.5°F | Ht 61.0 in | Wt 159.0 lb

## 2021-09-27 DIAGNOSIS — D6859 Other primary thrombophilia: Secondary | ICD-10-CM

## 2021-09-27 DIAGNOSIS — F4329 Adjustment disorder with other symptoms: Secondary | ICD-10-CM

## 2021-09-27 DIAGNOSIS — R1030 Lower abdominal pain, unspecified: Secondary | ICD-10-CM

## 2021-09-27 MED ORDER — ARIPIPRAZOLE 2 MG PO TABS: 2.0000 mg | ORAL_TABLET | Freq: Every day | ORAL | 2 refills | Status: DC

## 2021-09-27 NOTE — Patient Instructions (Addendum)
I recommend a trial of ADDING  Abilify to your current wellbutrin  dose  Consider reading C.S. Bobby Rumpf" book:  " A grief observed."  Return on Monday to provide a urine sample  At the end of the month of Eliquis,  we will overlap eliquis and coumadin the last 3 days

## 2021-09-27 NOTE — Progress Notes (Signed)
Subjective:  Patient ID: [name], female    DOB: [birth year]  Age: 71 y.o. MRN: [id number]  CC: There were no encounter diagnoses.   HPI [name] presents for  Chief Complaint  Patient presents with   Follow-up    Follow up on grief of losing husband.   [name] is a 71 yr old female who lost her husband to PD in November 2021. She has had a very difficult time adjusting to the loss and has been in a constant state of grief since then.  She was prescribed wellbutrin and duloxetine  and in June 2022 stopped duloxetine but was advised to continue wellbutrin   She has chronic constipation ,  aggravated recently by a trial of Ozempic for weight , which was stopped due to bloating  nausea and obstipation.  For the past several months she has been having functional diarrhea and was referred to GI .    EGD/Colonoscopy in July: normal.  CT august:  constipation .  IBS diagnosed.  Linzess prescribed , planning to start on Sunday along with Septra  for 2 weeks for SIBO with eliquis as a substitute for coumadin   Attended  grief counselling for 6 weeks  with her daughter [name] through her church last summer. Met once a week in a group but felt that the therapist's own grief got in the way of her effectiveness /\.  She feels she is in a worse place than she was last year, "the reality has set in"  she feels "left behind"    Outpatient Medications Prior to Visit  Medication Sig Dispense Refill   ALPRAZolam (XANAX) 0.25 MG tablet Take 1 tablet (0.25 mg total) by mouth at bedtime as needed for anxiety. 30 tablet 1   amLODipine (NORVASC) 5 MG tablet Take 1 tablet by mouth once daily 90 tablet 0   apixaban (ELIQUIS) 5 MG TABS tablet Take 1 tablet (5 mg total) by mouth 2 (two) times daily. 60 tablet 1   aspirin 81 MG tablet Take 81 mg by mouth daily.     atorvastatin (LIPITOR) 40 MG tablet Take 1 tablet by mouth once daily 90 tablet 0   buPROPion (WELLBUTRIN XL) 150 MG 24 hr tablet Take 1 tablet by  mouth once daily 90 tablet 1   butalbital-acetaminophen-caffeine (FIORICET) 50-325-40 MG tablet TAKE 1 TABLET BY MOUTH EVERY 12 HOURS AS NEEDED FOR HEADACHE 60 tablet 0   Cholecalciferol (VITAMIN D3) 1000 UNITS CAPS Take by mouth.     cyanocobalamin (,VITAMIN B-12,) 1000 MCG/ML injection INJECT 1ML INTO THE MUSCLE EVERY 30 DAYS 10 mL 0   furosemide (LASIX) 20 MG tablet Take one tablet by two times weekly (Tuesday and Friday)     glucose blood test strip Use to check blood sugar once daily. (Patient not taking: Reported on 09/16/2021) 100 each 5   metoprolol succinate (TOPROL-XL) 100 MG 24 hr tablet TAKE 1 TABLET BY MOUTH AT BEDTIME (TAKE  WITH  OR  IMMEDIATELY  FOLLOWING  A  MEAL) 90 tablet 0   mometasone (ELOCON) 0.1 % cream Apply topically daily. to ear canal as needed for itching (Patient not taking: Reported on 09/16/2021) 15 g 1   nitroGLYCERIN (NITROSTAT) 0.4 MG SL tablet Place 1 tablet (0.4 mg total) under the tongue every 5 (five) minutes as needed for chest pain. (Patient not taking: Reported on 09/16/2021) 50 tablet 3   Omega-3 Fatty Acids (FISH OIL) 1000 MG CAPS Take 1 capsule daily by  mouth.     OneTouch Delica Lancets 29N MISC Use to check blood sugar once daily. (Patient not taking: Reported on 09/16/2021) 100 each 5   sulfamethoxazole-trimethoprim (BACTRIM DS) 800-160 MG tablet Take 1 tablet by mouth 2 (two) times daily for 14 days. 28 tablet 0   Syringe/Needle, Disp, (SYRINGE 3CC/25GX1") 25G X 1" 3 ML MISC Use for b12 injections 50 each 0   telmisartan (MICARDIS) 80 MG tablet Take 1 tablet by mouth once daily 90 tablet 1   tiZANidine (ZANAFLEX) 4 MG tablet Take 1 tablet (4 mg total) by mouth at bedtime as needed for muscle spasms. 30 tablet 0   tolterodine (DETROL LA) 2 MG 24 hr capsule Take 1 capsule by mouth once daily 90 capsule 0   traMADol (ULTRAM) 50 MG tablet Take 1 tablet (50 mg total) by mouth every 8 (eight) hours as needed. 90 tablet 4   traZODone (DESYREL) 50 MG tablet TAKE 1  TABLET BY MOUTH AT BEDTIME 90 tablet 0   triamcinolone cream (KENALOG) 0.1 % Apply 1 application topically 2 (two) times daily. 30 g 0   lactulose (CHRONULAC) 10 GM/15ML solution Take 30 mLs (20 g total) by mouth 2 (two) times daily as needed for moderate constipation. 236 mL 0   Facility-Administered Medications Prior to Visit  Medication Dose Route Frequency Provider Last Rate Last Admin   cyanocobalamin ((VITAMIN B-12)) injection 1,000 mcg  1,000 mcg Intramuscular Once Crecencio Mc, MD        Review of Systems;  Patient denies headache, fevers, malaise, unintentional weight loss, skin rash, eye pain, sinus congestion and sinus pain, sore throat, dysphagia,  hemoptysis , cough, dyspnea, wheezing, chest pain, palpitations, orthopnea, edema, abdominal pain, nausea, melena, diarrhea, constipation, flank pain, dysuria, hematuria, urinary  Frequency, nocturia, numbness, tingling, seizures,  Focal weakness, Loss of consciousness,  Tremor, insomnia, depression, anxiety, and suicidal ideation.      Objective:  BP 136/68 (BP Location: Left Arm, Patient Position: Sitting, Cuff Size: Normal)   Pulse (!) 58   Temp 98.5 F (36.9 C) (Oral)   Ht $R'5\' 1"'AJ$  (1.549 m)   Wt 159 lb (72.1 kg)   SpO2 96%   BMI 30.04 kg/m   BP Readings from Last 3 Encounters:  09/27/21 136/68  09/16/21 134/80  07/12/21 (!) 142/56    Wt Readings from Last 3 Encounters:  09/27/21 159 lb (72.1 kg)  09/16/21 158 lb 12.8 oz (72 kg)  07/12/21 149 lb (67.6 kg)    General appearance: alert, cooperative and appears stated age Ears: normal TM's and external ear canals both ears Throat: lips, mucosa, and tongue normal; teeth and gums normal Neck: no adenopathy, no carotid bruit, supple, symmetrical, trachea midline and thyroid not enlarged, symmetric, no tenderness/mass/nodules Back: symmetric, no curvature. ROM normal. No CVA tenderness. Lungs: clear to auscultation bilaterally Heart: regular rate and rhythm, S1, S2  normal, no murmur, click, rub or gallop Abdomen: soft, non-tender; bowel sounds normal; no masses,  no organomegaly Pulses: 2+ and symmetric Skin: Skin color, texture, turgor normal. No rashes or lesions Lymph nodes: Cervical, supraclavicular, and axillary nodes normal. Neuro:  awake and interactive with normal mood and affect. Higher cortical functions are normal. Speech is clear without word-finding difficulty or dysarthria. Extraocular movements are intact. Visual fields of both eyes are grossly intact. Sensation to light touch is grossly intact bilaterally of upper and lower extremities. Motor examination shows 4+/5 symmetric hand grip and upper extremity and 5/5 lower extremity strength. There is  no pronation or drift. Gait is non-ataxic   Lab Results  Component Value Date   HGBA1C 5.9 (H) 02/15/2021   HGBA1C 6.2 09/04/2020   HGBA1C 6.3 (H) 05/14/2020    Lab Results  Component Value Date   CREATININE 0.90 08/06/2021   CREATININE 1.06 (H) 02/15/2021   CREATININE 0.93 10/15/2020    Lab Results  Component Value Date   WBC 5.8 02/15/2021   HGB 14.4 02/15/2021   HCT 43.6 02/15/2021   PLT 175 02/15/2021   GLUCOSE 87 02/15/2021   CHOL 158 02/15/2021   TRIG 158 (H) 02/15/2021   HDL 52 02/15/2021   LDLDIRECT 111.0 08/13/2016   LDLCALC 81 02/15/2021   ALT 14 02/15/2021   AST 17 02/15/2021   NA 141 02/15/2021   K 4.5 02/15/2021   CL 103 02/15/2021   CREATININE 0.90 08/06/2021   BUN 16 02/15/2021   CO2 28 02/15/2021   TSH 2.62 11/18/2016   INR 2.3 (H) 08/21/2021   HGBA1C 5.9 (H) 02/15/2021   MICROALBUR 2.2 (H) 11/13/2017    US Renal  Result Date: 09/26/2021 CLINICAL DATA:  Lower abdominal pain.  Left renal lesion. EXAM: RENAL / URINARY TRACT ULTRASOUND COMPLETE COMPARISON:  CT scan of the abdomen and pelvis August 06, 2021 FINDINGS: Right Kidney: Renal measurements: 10.0 x 4.4 x 4.8 cm = volume: 120 mL. Echogenicity within normal limits. No mass or hydronephrosis visualized.  Left Kidney: Renal measurements: 10.9 x 5.2 x 4.6 cm = volume: 138 mL. There is a 1.7 cm simple cyst in the lower pole of the left kidney. No follow-up imaging is recommended the cyst. Bladder: The bladder wall is prominent anteriorly measuring up to 7 mm. However, the bladder is poorly distended. The bladder wall is normal in thickness on the comparison CT scan. Other: None. IMPRESSION: 1. No suspicious masses or findings in either kidney. 2. The bladder is poorly evaluated due to lack of distention. The anterior bladder wall measures 7 mm which is prominent. This could be due to lack of distention, especially since the bladder wall was normal on the comparison CT scan. Cystitis could result in a similar finding. Recommend correlation and correlation with urinalysis if clinically warranted. Electronically Signed   By: Dorise Bullion III M.D.   On: 09/26/2021 13:05    Assessment & Plan:   Problem List Items Addressed This Visit   None   I spent a total of   minutes with this patient in a face to face visit on the date of this encounter reviewing the last office visit with me in       ,  most recent visit with cardiology ,    ,  patient's diet and exercise habits, home blood pressure /blod sugar readings, recent ER visit including labs and imaging studies ,   and post visit ordering of testing and therapeutics.    Follow-up: No follow-ups on file.   Crecencio Mc, MD

## 2021-09-29 NOTE — Assessment & Plan Note (Signed)
She will be anticoagulated with eliquis during treatment of SIBO with Septra DS.  She prefers to resume warfarin for long term management of increased embolic /thrombotic risk

## 2021-09-29 NOTE — Assessment & Plan Note (Addendum)
Faith based counselling given lasting 40 mintues in duration . She is not suicidal .   Adding abilify to current odse of wellbutrin

## 2021-09-30 DIAGNOSIS — R1030 Lower abdominal pain, unspecified: Secondary | ICD-10-CM | POA: Diagnosis not present

## 2021-10-01 LAB — URINALYSIS, ROUTINE W REFLEX MICROSCOPIC
Bilirubin Urine: NEGATIVE
Hgb urine dipstick: NEGATIVE
Ketones, ur: NEGATIVE
Leukocytes,Ua: NEGATIVE
Nitrite: NEGATIVE
RBC / HPF: NONE SEEN (ref 0–?)
Specific Gravity, Urine: 1.01 (ref 1.000–1.030)
Total Protein, Urine: NEGATIVE
Urine Glucose: NEGATIVE
Urobilinogen, UA: 0.2 (ref 0.0–1.0)
pH: 6 (ref 5.0–8.0)

## 2021-10-02 LAB — URINE CULTURE
MICRO NUMBER:: 13963255
SPECIMEN QUALITY:: ADEQUATE

## 2021-10-10 DIAGNOSIS — M79672 Pain in left foot: Secondary | ICD-10-CM | POA: Diagnosis not present

## 2021-10-10 DIAGNOSIS — M7989 Other specified soft tissue disorders: Secondary | ICD-10-CM | POA: Diagnosis not present

## 2021-10-21 ENCOUNTER — Other Ambulatory Visit: Payer: Self-pay | Admitting: Internal Medicine

## 2021-10-22 DIAGNOSIS — H04221 Epiphora due to insufficient drainage, right lacrimal gland: Secondary | ICD-10-CM | POA: Diagnosis not present

## 2021-10-23 ENCOUNTER — Other Ambulatory Visit
Admission: RE | Admit: 2021-10-23 | Discharge: 2021-10-23 | Disposition: A | Discharge status: Home / Self Care | Payer: PPO | Attending: Oculoplastics Ophthalmology | Admitting: Oculoplastics Ophthalmology

## 2021-10-23 DIAGNOSIS — H11423 Conjunctival edema, bilateral: Secondary | ICD-10-CM | POA: Diagnosis not present

## 2021-10-23 LAB — TSH: TSH: 2.023 u[IU]/mL (ref 0.350–4.500)

## 2021-10-23 LAB — T4, FREE: Free T4: 0.94 ng/dL (ref 0.61–1.12)

## 2021-10-23 LAB — SEDIMENTATION RATE: Sed Rate: 3 mm/hr (ref 0–30)

## 2021-10-24 LAB — THYROID STIMULATING IMMUNOGLOBULIN: Thyroid Stimulating Immunoglob: <0.1 IU/L (ref 0.00–0.55)

## 2021-10-28 ENCOUNTER — Encounter: Payer: Self-pay | Admitting: Internal Medicine

## 2021-10-28 ENCOUNTER — Ambulatory Visit (INDEPENDENT_AMBULATORY_CARE_PROVIDER_SITE_OTHER): Payer: PPO | Admitting: Internal Medicine

## 2021-10-28 VITALS — BP 140/64 | HR 56 | Temp 98.3°F | Ht 61.0 in | Wt 157.6 lb

## 2021-10-28 DIAGNOSIS — R809 Proteinuria, unspecified: Secondary | ICD-10-CM | POA: Diagnosis not present

## 2021-10-28 DIAGNOSIS — E559 Vitamin D deficiency, unspecified: Secondary | ICD-10-CM | POA: Diagnosis not present

## 2021-10-28 DIAGNOSIS — Z23 Encounter for immunization: Secondary | ICD-10-CM | POA: Diagnosis not present

## 2021-10-28 DIAGNOSIS — E785 Hyperlipidemia, unspecified: Secondary | ICD-10-CM | POA: Diagnosis not present

## 2021-10-28 DIAGNOSIS — I10 Essential (primary) hypertension: Secondary | ICD-10-CM

## 2021-10-28 DIAGNOSIS — R944 Abnormal results of kidney function studies: Secondary | ICD-10-CM

## 2021-10-28 DIAGNOSIS — E1129 Type 2 diabetes mellitus with other diabetic kidney complication: Secondary | ICD-10-CM | POA: Diagnosis not present

## 2021-10-28 DIAGNOSIS — E1169 Type 2 diabetes mellitus with other specified complication: Secondary | ICD-10-CM | POA: Diagnosis not present

## 2021-10-28 DIAGNOSIS — Z7901 Long term (current) use of anticoagulants: Secondary | ICD-10-CM | POA: Diagnosis not present

## 2021-10-28 DIAGNOSIS — F4329 Adjustment disorder with other symptoms: Secondary | ICD-10-CM | POA: Diagnosis not present

## 2021-10-28 LAB — COMPREHENSIVE METABOLIC PANEL
ALT: 15 U/L (ref 0–35)
AST: 16 U/L (ref 0–37)
Albumin: 4.2 g/dL (ref 3.5–5.2)
Alkaline Phosphatase: 85 U/L (ref 39–117)
BUN: 12 mg/dL (ref 6–23)
CO2: 29 mEq/L (ref 19–32)
Calcium: 9 mg/dL (ref 8.4–10.5)
Chloride: 102 mEq/L (ref 96–112)
Creatinine, Ser: 0.83 mg/dL (ref 0.40–1.20)
GFR: 70.35 mL/min (ref >60.00)
Glucose, Bld: 91 mg/dL (ref 70–99)
Potassium: 3.8 mEq/L (ref 3.5–5.1)
Sodium: 141 mEq/L (ref 135–145)
Total Bilirubin: 0.6 mg/dL (ref 0.2–1.2)
Total Protein: 6.2 g/dL (ref 6.0–8.3)

## 2021-10-28 LAB — VITAMIN D 25 HYDROXY (VIT D DEFICIENCY, FRACTURES): VITD: 50.21 ng/mL (ref 30.00–100.00)

## 2021-10-28 LAB — HEMOGLOBIN A1C: Hgb A1c MFr Bld: 6.1 % (ref 4.6–6.5)

## 2021-10-28 LAB — CBC WITH DIFFERENTIAL/PLATELET
Basophils Absolute: 0.1 10*3/uL (ref 0.0–0.1)
Basophils Relative: 1 % (ref 0.0–3.0)
Eosinophils Absolute: 0.1 10*3/uL (ref 0.0–0.7)
Eosinophils Relative: 1.9 % (ref 0.0–5.0)
HCT: 40.6 % (ref 36.0–46.0)
Hemoglobin: 13.3 g/dL (ref 12.0–15.0)
Lymphocytes Relative: 22.8 % (ref 12.0–46.0)
Lymphs Abs: 1.3 10*3/uL (ref 0.7–4.0)
MCHC: 32.9 g/dL (ref 30.0–36.0)
MCV: 87.7 fl (ref 78.0–100.0)
Monocytes Absolute: 0.5 10*3/uL (ref 0.1–1.0)
Monocytes Relative: 9.3 % (ref 3.0–12.0)
Neutro Abs: 3.8 10*3/uL (ref 1.4–7.7)
Neutrophils Relative %: 65 % (ref 43.0–77.0)
Platelets: 180 10*3/uL (ref 150.0–400.0)
RBC: 4.63 Mil/uL (ref 3.87–5.11)
RDW: 14.3 % (ref 11.5–15.5)
WBC: 5.8 10*3/uL (ref 4.0–10.5)

## 2021-10-28 MED ORDER — ARIPIPRAZOLE 2 MG PO TABS: 2.0000 mg | ORAL_TABLET | Freq: Every day | ORAL | 1 refills | Status: DC

## 2021-10-28 NOTE — Progress Notes (Signed)
Subjective:  Patient ID: [name], female    DOB: Jul 18, 1950  Age: 71 y.o. MRN: [id number]  CC: The primary encounter diagnosis was Controlled type 2 diabetes mellitus with microalbuminuria, without long-term current use of insulin ([place]). Diagnoses of Decreased GFR, Primary hypertension, Hyperlipidemia associated with type 2 diabetes mellitus ([place]), Vitamin D deficiency, Anticoagulation monitoring, INR range 2-3, Need for immunization against influenza, and Grief reaction with prolonged bereavement were also pertinent to this visit.   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  Has rejoined church,  feels slightly better since Abilify 2 mg was  added to wellbutrin   2 Type 2 DM : diet controlled  by last a1c Februray.  Not exercising ,  no appetite  .  Not checking blood sugars  3) HTN:  taking amlodipine 5 mg ,  Toprol XL 100 mg  (a fib) and telmisartan 80 mg .  Not checking BP at home   4) SIBO finished the abx 10 days ago,  still using linzess every other day .  causes diarrhea   5) Atrial fib:  currently anticoagulated with eliquis due to use fo abx for SOBI.     Outpatient Medications Prior to Visit  Medication Sig Dispense Refill   ALPRAZolam (XANAX) 0.25 MG tablet Take 1 tablet (0.25 mg total) by mouth at bedtime as needed for anxiety. 30 tablet 1   amLODipine (NORVASC) 5 MG tablet Take 1 tablet by mouth once daily 90 tablet 0   apixaban (ELIQUIS) 5 MG TABS tablet Take 1 tablet (5 mg total) by mouth 2 (two) times daily. 60 tablet 1   ARIPiprazole (ABILIFY) 2 MG tablet Take 1 tablet (2 mg total) by mouth daily. 30 tablet 2   aspirin 81 MG tablet Take 81 mg by mouth daily.     atorvastatin (LIPITOR) 40 MG tablet Take 1 tablet by mouth once daily 90  tablet 0   buPROPion (WELLBUTRIN XL) 150 MG 24 hr tablet Take 1 tablet by mouth once daily 90 tablet 1   butalbital-acetaminophen-caffeine (FIORICET) 50-325-40 MG tablet TAKE 1 TABLET BY MOUTH EVERY 12 HOURS AS NEEDED FOR HEADACHE 60 tablet 0   Cholecalciferol (VITAMIN D3) 1000 UNITS CAPS Take by mouth.     cyanocobalamin (,VITAMIN B-12,) 1000 MCG/ML injection INJECT 1ML INTO THE MUSCLE EVERY 30 DAYS 10 mL 0   furosemide (LASIX) 20 MG tablet Take one tablet by two times weekly (Tuesday and Friday)     glucose blood test strip Use to check blood sugar once daily. 100 each 5   metoprolol succinate (TOPROL-XL) 100 MG 24 hr tablet TAKE 1 TABLET BY MOUTH AT BEDTIME (TAKE  WITH  OR  IMMEDIATELY  FOLLOWING  A  MEAL) 90 tablet 0   nitroGLYCERIN (NITROSTAT) 0.4 MG SL tablet Place 1 tablet (0.4 mg total) under the tongue every 5 (five) minutes as needed for chest pain. 50 tablet 3   Omega-3 Fatty Acids (FISH OIL) 1000 MG CAPS Take 1 capsule daily by mouth.     OneTouch Delica Lancets 77A MISC Use to check blood sugar once daily. 100 each 5   Syringe/Needle, Disp, (SYRINGE 3CC/25GX1") 25G X 1" 3 ML MISC Use for b12 injections 50 each 0  telmisartan (MICARDIS) 80 MG tablet Take 1 tablet by mouth once daily 90 tablet 1   traZODone (DESYREL) 50 MG tablet TAKE 1 TABLET BY MOUTH AT BEDTIME 90 tablet 0   triamcinolone cream (KENALOG) 0.1 % Apply 1 application topically 2 (two) times daily. 30 g 0   LINZESS 72 MCG capsule Take 72 mcg by mouth daily. (Patient not taking: Reported on 10/28/2021)     mometasone (ELOCON) 0.1 % cream Apply topically daily. to ear canal as needed for itching (Patient not taking: Reported on 10/28/2021) 15 g 1   tolterodine (DETROL LA) 2 MG 24 hr capsule Take 1 capsule by mouth once daily (Patient not taking: Reported on 10/28/2021) 90 capsule 0   traMADol (ULTRAM) 50 MG tablet Take 1 tablet (50 mg total) by mouth every 8 (eight) hours as needed. (Patient not taking: Reported on  10/28/2021) 90 tablet 4   Facility-Administered Medications Prior to Visit  Medication Dose Route Frequency Provider Last Rate Last Admin   cyanocobalamin ((VITAMIN B-12)) injection 1,000 mcg  1,000 mcg Intramuscular Once Crecencio Mc, MD        Review of Systems;  Patient denies headache, fevers, malaise, unintentional weight loss, skin rash, eye pain, sinus congestion and sinus pain, sore throat, dysphagia,  hemoptysis , cough, dyspnea, wheezing, chest pain, palpitations, orthopnea, edema, abdominal pain, nausea, melena, diarrhea, constipation, flank pain, dysuria, hematuria, urinary  Frequency, nocturia, numbness, tingling, seizures,  Focal weakness, Loss of consciousness,  Tremor, insomnia, depression, anxiety, and suicidal ideation.      Objective:  BP (!) 140/64 (BP Location: Left Arm, Patient Position: Sitting, Cuff Size: Large)   Pulse (!) 56   Temp 98.3 F (36.8 C) (Oral)   Ht '5\' 1"'$  (1.549 m)   Wt 157 lb 9.6 oz (71.5 kg)   SpO2 96%   BMI 29.78 kg/m   BP Readings from Last 3 Encounters:  10/28/21 (!) 140/64  09/27/21 136/68  09/16/21 134/80    Wt Readings from Last 3 Encounters:  10/28/21 157 lb 9.6 oz (71.5 kg)  09/27/21 159 lb (72.1 kg)  09/16/21 158 lb 12.8 oz (72 kg)    General appearance: alert, cooperative and appears stated age Ears: normal TM's and external ear canals both ears Throat: lips, mucosa, and tongue normal; teeth and gums normal Neck: no adenopathy, no carotid bruit, supple, symmetrical, trachea midline and thyroid not enlarged, symmetric, no tenderness/mass/nodules Back: symmetric, no curvature. ROM normal. No CVA tenderness. Lungs: clear to auscultation bilaterally Heart: regular rate and rhythm, S1, S2 normal, no murmur, click, rub or gallop Abdomen: soft, non-tender; bowel sounds normal; no masses,  no organomegaly Pulses: 2+ and symmetric Skin: Skin color, texture, turgor normal. No rashes or lesions Lymph nodes: Cervical,  supraclavicular, and axillary nodes normal. Neuro:  awake and interactive with normal mood and affect. Higher cortical functions are normal. Speech is clear without word-finding difficulty or dysarthria. Extraocular movements are intact. Visual fields of both eyes are grossly intact. Sensation to light touch is grossly intact bilaterally of upper and lower extremities. Motor examination shows 4+/5 symmetric hand grip and upper extremity and 5/5 lower extremity strength. There is no pronation or drift. Gait is non-ataxic   Lab Results  Component Value Date   HGBA1C 6.1 10/28/2021   HGBA1C 5.9 (H) 02/15/2021   HGBA1C 6.2 09/04/2020    Lab Results  Component Value Date   CREATININE 0.83 10/28/2021   CREATININE 0.90 08/06/2021   CREATININE 1.06 (H) 02/15/2021  Lab Results  Component Value Date   WBC 5.8 10/28/2021   HGB 13.3 10/28/2021   HCT 40.6 10/28/2021   PLT 180.0 10/28/2021   GLUCOSE 91 10/28/2021   CHOL 158 02/15/2021   TRIG 158 (H) 02/15/2021   HDL 52 02/15/2021   LDLDIRECT 111.0 08/13/2016   LDLCALC 81 02/15/2021   ALT 15 10/28/2021   AST 16 10/28/2021   NA 141 10/28/2021   K 3.8 10/28/2021   CL 102 10/28/2021   CREATININE 0.83 10/28/2021   BUN 12 10/28/2021   CO2 29 10/28/2021   TSH 2.023 10/23/2021   INR 2.3 (H) 08/21/2021   HGBA1C 6.1 10/28/2021   MICROALBUR 2.2 (H) 11/13/2017    No results found.  Assessment & Plan:   Problem List Items Addressed This Visit     Vitamin D deficiency   Relevant Orders   VITAMIN D 25 Hydroxy (Vit-D Deficiency, Fractures) (Completed)   Hypertension    Well controlled on current regimen. Renal function stable, no changes today.  . Lab Results  Component Value Date   CREATININE 0.83 10/28/2021        Relevant Orders   Comprehensive metabolic panel (Completed)   Grief reaction with prolonged bereavement    Slight improvement with addition of abilify to wellbutrin .  encouarged to double her Abilify dose        Controlled type 2 diabetes mellitus with microalbuminuria, without long-term current use of insulin ([place]) - Primary    She had to stop  Ozempic due to persistent side effects of nausea, bloating and diarrhea. Her diabetes is well controlled without medications.    Lab Results  Component Value Date   HGBA1C 6.1 10/28/2021   Lab Results  Component Value Date   LABMICR 26.1 05/14/2020   LABMICR See below: 02/15/2020   MICROALBUR 2.2 (H) 11/13/2017   MICROALBUR 0.3 11/21/2016           Relevant Orders   Hemoglobin A1c (Completed)   Anticoagulation monitoring, INR range 2-3   Relevant Orders   Protime-INR   Protime-INR   Other Visit Diagnoses     Decreased GFR       Relevant Orders   Comprehensive metabolic panel (Completed)   CBC with Differential/Platelet (Completed)   Hyperlipidemia associated with type 2 diabetes mellitus ([place])       Relevant Orders   Lipid Panel w/reflex Direct LDL   Need for immunization against influenza       Relevant Orders   Flu Vaccine QUAD High Dose(Fluad) (Completed)       I spent a total of 40 minutes with this patient in a face to face visit on the date of this encounter reviewing the last office visit with me in       ,  most recent visit with cardiology ,    ,  patient's diet and exercise habits, home blood pressure /blod sugar readings, recent ER visit including labs and imaging studies ,   and post visit ordering of testing and therapeutics.    Follow-up: No follow-ups on file.   Crecencio Mc, MD

## 2021-10-28 NOTE — Patient Instructions (Addendum)
Ask your orthopedist  if and when you can start a walking program   You can stop the fish oil   You need all 3 medications for hypertension  Double your dose of abilify starting now.  (4 mg daily)  Start back on coumadin tonight using 5 mg daily   Last dose of eliquis will be  Wednesday  Check INR on Friday

## 2021-10-28 NOTE — Assessment & Plan Note (Signed)
Slight improvement with addition of abilify to wellbutrin .  encouarged to double her Abilify dose

## 2021-10-28 NOTE — Assessment & Plan Note (Signed)
Well controlled on current regimen. Renal function stable, no changes today.  . Lab Results  Component Value Date   CREATININE 0.83 10/28/2021

## 2021-10-28 NOTE — Assessment & Plan Note (Signed)
She had to stop  Ozempic due to persistent side effects of nausea, bloating and diarrhea. Her diabetes is well controlled without medications.    Lab Results  Component Value Date   HGBA1C 6.1 10/28/2021   Lab Results  Component Value Date   LABMICR 26.1 05/14/2020   LABMICR See below: 02/15/2020   MICROALBUR 2.2 (H) 11/13/2017   MICROALBUR 0.3 11/21/2016

## 2021-10-29 DIAGNOSIS — M79672 Pain in left foot: Secondary | ICD-10-CM | POA: Diagnosis not present

## 2021-10-29 LAB — LIPID PANEL W/REFLEX DIRECT LDL
Cholesterol: 132 mg/dL (ref <200)
HDL: 48 mg/dL — ABNORMAL LOW (ref >=50)
LDL Cholesterol (Calc): 64 mg/dL (calc)
Non-HDL Cholesterol (Calc): 84 mg/dL (calc) (ref <130)
Total CHOL/HDL Ratio: 2.8 (calc) (ref <5.0)
Triglycerides: 113 mg/dL (ref <150)

## 2021-11-01 ENCOUNTER — Other Ambulatory Visit (INDEPENDENT_AMBULATORY_CARE_PROVIDER_SITE_OTHER): Payer: PPO

## 2021-11-01 DIAGNOSIS — Z7901 Long term (current) use of anticoagulants: Secondary | ICD-10-CM | POA: Diagnosis not present

## 2021-11-01 LAB — PROTIME-INR
INR: 1.4 ratio — ABNORMAL HIGH (ref 0.8–1.0)
Prothrombin Time: 14.8 s — ABNORMAL HIGH (ref 9.6–13.1)

## 2021-11-04 ENCOUNTER — Other Ambulatory Visit: Payer: Self-pay | Admitting: Family

## 2021-11-04 ENCOUNTER — Other Ambulatory Visit: Payer: Self-pay | Admitting: Internal Medicine

## 2021-11-04 DIAGNOSIS — E538 Deficiency of other specified B group vitamins: Secondary | ICD-10-CM

## 2021-11-11 ENCOUNTER — Other Ambulatory Visit: Payer: Self-pay | Admitting: Internal Medicine

## 2021-11-11 DIAGNOSIS — M79672 Pain in left foot: Secondary | ICD-10-CM | POA: Diagnosis not present

## 2021-11-11 NOTE — Telephone Encounter (Signed)
LMTCB. Need to let pt know that she will need to contact cardiology for the refill. That is who prescribes this medication.

## 2021-11-21 ENCOUNTER — Other Ambulatory Visit: Payer: Self-pay | Admitting: Internal Medicine

## 2021-11-21 DIAGNOSIS — H10433 Chronic follicular conjunctivitis, bilateral: Secondary | ICD-10-CM | POA: Diagnosis not present

## 2021-11-21 DIAGNOSIS — E1169 Type 2 diabetes mellitus with other specified complication: Secondary | ICD-10-CM

## 2021-11-26 DIAGNOSIS — K5900 Constipation, unspecified: Secondary | ICD-10-CM | POA: Diagnosis not present

## 2021-11-26 DIAGNOSIS — K219 Gastro-esophageal reflux disease without esophagitis: Secondary | ICD-10-CM | POA: Diagnosis not present

## 2021-12-02 ENCOUNTER — Other Ambulatory Visit (INDEPENDENT_AMBULATORY_CARE_PROVIDER_SITE_OTHER): Payer: PPO

## 2021-12-02 ENCOUNTER — Telehealth: Payer: Self-pay

## 2021-12-02 DIAGNOSIS — Z7901 Long term (current) use of anticoagulants: Secondary | ICD-10-CM

## 2021-12-02 NOTE — Telephone Encounter (Signed)
Lab has been ordered.

## 2021-12-02 NOTE — Addendum Note (Signed)
Addended by: Adair Laundry on: 12/02/2021 10:31 AM   Modules accepted: Orders

## 2021-12-02 NOTE — Telephone Encounter (Signed)
Pt called requesting lab appt for INR check requested by PCP. Pt sch for lab appt today 11/27 '@2'$ :15, please order.

## 2021-12-03 LAB — PROTIME-INR
INR: 1.2 — ABNORMAL HIGH
Prothrombin Time: 12.2 s — ABNORMAL HIGH (ref 9.0–11.5)

## 2021-12-05 ENCOUNTER — Encounter: Payer: Self-pay | Admitting: Internal Medicine

## 2021-12-05 MED ORDER — WARFARIN SODIUM 1 MG PO TABS: 1.0000 mg | ORAL_TABLET | Freq: Every day | ORAL | 1 refills | Status: DC

## 2021-12-05 MED ORDER — WARFARIN SODIUM 6 MG PO TABS: 6.0000 mg | ORAL_TABLET | Freq: Every day | ORAL | 0 refills | Status: DC

## 2021-12-05 NOTE — Telephone Encounter (Signed)
Added mychart response to result note.

## 2021-12-05 NOTE — Assessment & Plan Note (Signed)
INR is low on 6 mg daily.  Advised to take 12 mg next dose x 1,  then increase daily dose to 7 mg   Lab Results  Component Value Date   INR 1.2 (H) 12/02/2021   INR 1.4 (H) 11/01/2021   INR 2.3 (H) 08/21/2021   PROTIME 57.9 (A) 02/17/2014

## 2021-12-05 NOTE — Addendum Note (Signed)
Addended by: Crecencio Mc on: 12/05/2021 05:47 PM   Modules accepted: Orders

## 2021-12-07 ENCOUNTER — Other Ambulatory Visit: Payer: Self-pay | Admitting: Internal Medicine

## 2021-12-07 DIAGNOSIS — I10 Essential (primary) hypertension: Secondary | ICD-10-CM

## 2021-12-10 ENCOUNTER — Other Ambulatory Visit: Payer: Self-pay | Admitting: Internal Medicine

## 2021-12-13 ENCOUNTER — Other Ambulatory Visit (INDEPENDENT_AMBULATORY_CARE_PROVIDER_SITE_OTHER): Payer: PPO

## 2021-12-13 ENCOUNTER — Telehealth: Payer: Self-pay | Admitting: *Deleted

## 2021-12-13 DIAGNOSIS — Z7901 Long term (current) use of anticoagulants: Secondary | ICD-10-CM | POA: Diagnosis not present

## 2021-12-13 LAB — PROTIME-INR
INR: 5.4 ratio (ref 0.8–1.0)
Prothrombin Time: 53.4 s (ref 9.6–13.1)

## 2021-12-13 NOTE — Telephone Encounter (Signed)
Spoke with pt and informed her of her results and the medication dosage instructions. Pt repeated directions and gave verbal understanding.

## 2021-12-13 NOTE — Telephone Encounter (Signed)
CRITICAL VALUE STICKER  CRITICAL VALUE: PT-53.4/INR-5.4  RECEIVER (on-site recipient of call):   DATE & TIME NOTIFIED: 12/11/21 @ 4:29pm  MESSENGER (representative from lab): Santiago Glad  MD NOTIFIED: Dr. Derrel Nip  TIME OF NOTIFICATION: 4:29pm  RESPONSE:

## 2021-12-13 NOTE — Telephone Encounter (Signed)
Inr is very high.  No coumadin tonight or Saturday.  Take just 1 mg on Sunday .  Take 6 mg daily starting on Monday.  Repeat INR in 10 days

## 2021-12-19 ENCOUNTER — Telehealth: Payer: Self-pay | Admitting: Internal Medicine

## 2021-12-19 DIAGNOSIS — Z7901 Long term (current) use of anticoagulants: Secondary | ICD-10-CM

## 2021-12-19 NOTE — Addendum Note (Signed)
Addended by: Crecencio Mc on: 12/19/2021 03:43 PM   Modules accepted: Orders

## 2021-12-19 NOTE — Telephone Encounter (Signed)
Patient has a lab appointment 12/23/2021, there are no orders in.

## 2021-12-19 NOTE — Telephone Encounter (Signed)
Lab has been ordered.

## 2021-12-19 NOTE — Addendum Note (Signed)
Addended by: Adair Laundry on: 12/19/2021 03:41 PM   Modules accepted: Orders

## 2021-12-20 ENCOUNTER — Other Ambulatory Visit: Payer: PPO

## 2021-12-23 ENCOUNTER — Other Ambulatory Visit (INDEPENDENT_AMBULATORY_CARE_PROVIDER_SITE_OTHER): Payer: PPO

## 2021-12-23 DIAGNOSIS — Z7901 Long term (current) use of anticoagulants: Secondary | ICD-10-CM

## 2021-12-23 LAB — PROTIME-INR
INR: 3.6 ratio — ABNORMAL HIGH (ref 0.8–1.0)
Prothrombin Time: 36.9 s — ABNORMAL HIGH (ref 9.6–13.1)

## 2021-12-24 ENCOUNTER — Encounter: Payer: Self-pay | Admitting: Internal Medicine

## 2021-12-26 MED ORDER — WARFARIN SODIUM 5 MG PO TABS: 5.0000 mg | ORAL_TABLET | Freq: Every day | ORAL | 3 refills | Status: DC

## 2022-01-03 ENCOUNTER — Telehealth: Payer: Self-pay | Admitting: Internal Medicine

## 2022-01-03 ENCOUNTER — Other Ambulatory Visit (INDEPENDENT_AMBULATORY_CARE_PROVIDER_SITE_OTHER): Payer: PPO

## 2022-01-03 DIAGNOSIS — Z7901 Long term (current) use of anticoagulants: Secondary | ICD-10-CM

## 2022-01-03 LAB — PROTIME-INR
INR: 2.5 ratio — ABNORMAL HIGH (ref 0.8–1.0)
Prothrombin Time: 25.9 s — ABNORMAL HIGH (ref 9.6–13.1)

## 2022-01-03 NOTE — Telephone Encounter (Signed)
LMTCB

## 2022-01-03 NOTE — Telephone Encounter (Signed)
Patient was in lab to get blood work done today. Patient stated she was hoping this was her last time. Patient and I were making small talk Patient stated she was taking Coumadin for blood clots. I told her my aunt is in [facility] hospital for blood clots. Patient stated that my aunt would probably have to take Coumadin like her and get blood work done too. I stated my aunt doesn't take her medication right since her husband is gone. Patient stated she probably gave up like me. Patient stated she feels like the love of her life is gone and left her and she feels sad and like she wants to go as well sometimes. Patient stated her husband has only been gone for a couple years

## 2022-01-08 DIAGNOSIS — H26491 Other secondary cataract, right eye: Secondary | ICD-10-CM | POA: Diagnosis not present

## 2022-01-08 DIAGNOSIS — E119 Type 2 diabetes mellitus without complications: Secondary | ICD-10-CM | POA: Diagnosis not present

## 2022-01-08 LAB — HM DIABETES EYE EXAM

## 2022-01-17 DIAGNOSIS — H26491 Other secondary cataract, right eye: Secondary | ICD-10-CM | POA: Diagnosis not present

## 2022-01-24 ENCOUNTER — Other Ambulatory Visit: Payer: Self-pay | Admitting: Internal Medicine

## 2022-01-24 ENCOUNTER — Other Ambulatory Visit: Payer: Self-pay | Admitting: Family

## 2022-01-24 DIAGNOSIS — F409 Phobic anxiety disorder, unspecified: Secondary | ICD-10-CM

## 2022-01-27 ENCOUNTER — Other Ambulatory Visit: Payer: Self-pay | Admitting: Internal Medicine

## 2022-01-28 ENCOUNTER — Encounter: Payer: Self-pay | Admitting: Internal Medicine

## 2022-01-28 ENCOUNTER — Ambulatory Visit (INDEPENDENT_AMBULATORY_CARE_PROVIDER_SITE_OTHER): Payer: PPO | Admitting: Internal Medicine

## 2022-01-28 VITALS — BP 156/88 | HR 72 | Temp 98.7°F | Ht 61.0 in | Wt 151.4 lb

## 2022-01-28 DIAGNOSIS — F5105 Insomnia due to other mental disorder: Secondary | ICD-10-CM

## 2022-01-28 DIAGNOSIS — I10 Essential (primary) hypertension: Secondary | ICD-10-CM | POA: Diagnosis not present

## 2022-01-28 DIAGNOSIS — E785 Hyperlipidemia, unspecified: Secondary | ICD-10-CM | POA: Diagnosis not present

## 2022-01-28 DIAGNOSIS — R809 Proteinuria, unspecified: Secondary | ICD-10-CM | POA: Diagnosis not present

## 2022-01-28 DIAGNOSIS — E1169 Type 2 diabetes mellitus with other specified complication: Secondary | ICD-10-CM | POA: Diagnosis not present

## 2022-01-28 DIAGNOSIS — E1129 Type 2 diabetes mellitus with other diabetic kidney complication: Secondary | ICD-10-CM | POA: Diagnosis not present

## 2022-01-28 DIAGNOSIS — R051 Acute cough: Secondary | ICD-10-CM | POA: Diagnosis not present

## 2022-01-28 DIAGNOSIS — J0100 Acute maxillary sinusitis, unspecified: Secondary | ICD-10-CM | POA: Diagnosis not present

## 2022-01-28 DIAGNOSIS — F409 Phobic anxiety disorder, unspecified: Secondary | ICD-10-CM | POA: Diagnosis not present

## 2022-01-28 DIAGNOSIS — Z7901 Long term (current) use of anticoagulants: Secondary | ICD-10-CM

## 2022-01-28 LAB — POCT INFLUENZA A/B
Influenza A, POC: NEGATIVE
Influenza B, POC: NEGATIVE

## 2022-01-28 LAB — POC COVID19 BINAXNOW: SARS Coronavirus 2 Ag: NEGATIVE

## 2022-01-28 LAB — POCT RAPID STREP A (OFFICE): Rapid Strep A Screen: NEGATIVE

## 2022-01-28 MED ORDER — CHERATUSSIN AC 100-10 MG/5ML PO SOLN: 5.0000 mL | Freq: Three times a day (TID) | ORAL | 0 refills | Status: DC | PRN

## 2022-01-28 MED ORDER — AMOXICILLIN-POT CLAVULANATE 875-125 MG PO TABS: 1.0000 | ORAL_TABLET | Freq: Two times a day (BID) | ORAL | 0 refills | Status: DC

## 2022-01-28 MED ORDER — ATORVASTATIN CALCIUM 40 MG PO TABS: 40.0000 mg | ORAL_TABLET | Freq: Every day | ORAL | 3 refills | Status: DC

## 2022-01-28 MED ORDER — PREDNISONE 10 MG PO TABS: ORAL_TABLET | ORAL | 0 refills | Status: DC

## 2022-01-28 MED ORDER — TELMISARTAN 80 MG PO TABS: 80.0000 mg | ORAL_TABLET | Freq: Every day | ORAL | 1 refills | Status: DC

## 2022-01-28 MED ORDER — APIXABAN 5 MG PO TABS: 5.0000 mg | ORAL_TABLET | Freq: Two times a day (BID) | ORAL | 5 refills | Status: DC

## 2022-01-28 MED ORDER — AMLODIPINE BESYLATE 10 MG PO TABS: 10.0000 mg | ORAL_TABLET | Freq: Every day | ORAL | 1 refills | Status: DC

## 2022-01-28 MED ORDER — BUTALBITAL-APAP-CAFFEINE 50-325-40 MG PO TABS: ORAL_TABLET | ORAL | Status: DC

## 2022-01-28 MED ORDER — ALPRAZOLAM 0.25 MG PO TABS: ORAL_TABLET | ORAL | 0 refills | Status: DC

## 2022-01-28 NOTE — Patient Instructions (Addendum)
I am treating you for sinusitis/otitis    I am prescribing an antibiotic (augmentin ) and a prednisone taper  To manage the infection and the inflammation in your ear/sinuses.   Consider adding Afrin nasal spray (OTC) every 12 hours for a few days for congestion   For the cough:  guaifenesin and dextromethorphan combination (available in pill or liquid form, and cheratussin with codeine (prescription) every 8 hours or just at night.   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

## 2022-01-28 NOTE — Assessment & Plan Note (Signed)
COVID, STREP AND FLU NEGATIVE.  Given multiple symptoms, development of facial pain and exam consistent with bacterial URI,  Will treat with empiric antibiotics, decongestants, and saline lavage.  Adding decongestant  nasal spray otc

## 2022-01-28 NOTE — Progress Notes (Unsigned)
Subjective:  Patient ID: [name], female    DOB: [birth year]  Age: 72 y.o. MRN: [id number]  CC: The primary encounter diagnosis was Primary hypertension. Diagnoses of Controlled type 2 diabetes mellitus with microalbuminuria, without long-term current use of insulin ([place]), Hyperlipidemia associated with type 2 diabetes mellitus ([place]), Insomnia due to anxiety and fear, Anticoagulation monitoring, INR range 2-3, Hypertension, unspecified type, Acute cough, and Acute non-recurrent maxillary sinusitis were also pertinent to this visit.   HPI [name] presents for  Chief Complaint  Patient presents with   Medical Management of Chronic Issues    3 month follow up on diabetes, hypertension, hyperlipidemia   5 day history of illness.  Sore throat, then  rapidly developed bilateral ear pain ,  persistent cough and headache bilateral maxillary pain .  Temp normal,  no body aches  .  Chest feels tight and wheezy but no pleurisy.  No diarrhea.  No nausea .  Not sleeping . No recent ravel   Taking mucinex ,  codeine cough syrup which helped. . Has not taken any decongestants.    2) HTN:  last home check was 3 weeks ago and was 164/ pre illness   Taking 5 mg warfarin inr was 2.5 dec 29       Outpatient Medications Prior to Visit  Medication Sig Dispense Refill   ARIPiprazole (ABILIFY) 2 MG tablet Take 1 tablet (2 mg total) by mouth daily. 30 tablet 2   ARIPiprazole (ABILIFY) 2 MG tablet Take 1 tablet (2 mg total) by mouth daily. 90 tablet 1   aspirin 81 MG tablet Take 81 mg by mouth daily.     buPROPion (WELLBUTRIN XL) 150 MG 24 hr tablet Take 1 tablet by mouth once daily 90 tablet 1   Cholecalciferol (VITAMIN D3) 1000 UNITS CAPS Take by mouth.     cyanocobalamin (VITAMIN B12) 1000 MCG/ML injection INJECT 1ML INTO THE MUSCLE EVERY 30 DAYS 10 mL 0   furosemide (LASIX) 20 MG tablet Take 1 tablet by mouth once daily 90 tablet 0   glucose blood test strip Use to check blood sugar once daily.  100 each 5   LINZESS 72 MCG capsule Take 72 mcg by mouth daily.     metoprolol succinate (TOPROL-XL) 100 MG 24 hr tablet TAKE 1 TABLET BY MOUTH AT BEDTIME (TAKE  WITH  OR  IMMEDIATELY  FOLLOWING  A  MEAL) 90 tablet 1   nitroGLYCERIN (NITROSTAT) 0.4 MG SL tablet Place 1 tablet (0.4 mg total) under the tongue every 5 (five) minutes as needed for chest pain. 50 tablet 3   Omega-3 Fatty Acids (FISH OIL) 1000 MG CAPS Take 1 capsule daily by mouth.     OneTouch Delica Lancets 01U MISC Use to check blood sugar once daily. 100 each 5   Syringe/Needle, Disp, (SYRINGE 3CC/25GX1") 25G X 1" 3 ML MISC Use for b12 injections 50 each 0   traZODone (DESYREL) 50 MG tablet TAKE 1 TABLET BY MOUTH AT BEDTIME 90 tablet 0   triamcinolone cream (KENALOG) 0.1 % Apply 1 application topically 2 (two) times daily. 30 g 0   warfarin (COUMADIN) 1 MG tablet Take 1 tablet (1 mg total) by mouth daily. With 6 mg .daily.  Total dose 7 mg 90 tablet 1   warfarin (COUMADIN) 5 MG tablet Take 1 tablet (5 mg total) by mouth daily. 30 tablet 3   warfarin (COUMADIN) 6 MG tablet Take 1 tablet (6 mg total) by mouth daily.  With 1 mg  total dose 7 mg 90 tablet 0   ALPRAZolam (XANAX) 0.25 MG tablet TAKE 1 TABLET BY MOUTH AT BEDTIME AS NEEDED FOR ANXIETY 30 tablet 0   amLODipine (NORVASC) 5 MG tablet Take 1 tablet by mouth once daily 90 tablet 1   atorvastatin (LIPITOR) 40 MG tablet Take 1 tablet by mouth once daily 90 tablet 0   butalbital-acetaminophen-caffeine (FIORICET) 50-325-40 MG tablet TAKE 1 TABLET BY MOUTH EVERY 12 HOURS AS NEEDED FOR HEADACHE 60 tablet 0   telmisartan (MICARDIS) 80 MG tablet Take 1 tablet by mouth once daily 90 tablet 0   apixaban (ELIQUIS) 5 MG TABS tablet Take 1 tablet (5 mg total) by mouth 2 (two) times daily. 60 tablet 1   Facility-Administered Medications Prior to Visit  Medication Dose Route Frequency Provider Last Rate Last Admin   cyanocobalamin ((VITAMIN B-12)) injection 1,000 mcg  1,000 mcg Intramuscular  Once Crecencio Mc, MD        Review of Systems;  Patient denies headache, fevers, malaise, unintentional weight loss, skin rash, eye pain, sinus congestion and sinus pain, sore throat, dysphagia,  hemoptysis , cough, dyspnea, wheezing, chest pain, palpitations, orthopnea, edema, abdominal pain, nausea, melena, diarrhea, constipation, flank pain, dysuria, hematuria, urinary  Frequency, nocturia, numbness, tingling, seizures,  Focal weakness, Loss of consciousness,  Tremor, insomnia, depression, anxiety, and suicidal ideation.      Objective:  BP (!) 156/88   Pulse 72   Temp 98.7 F (37.1 C) (Oral)   Ht '5\' 1"'$  (1.549 m)   Wt 151 lb 6.4 oz (68.7 kg)   SpO2 94%   BMI 28.61 kg/m   BP Readings from Last 3 Encounters:  01/28/22 (!) 156/88  10/28/21 (!) 140/64  09/27/21 136/68    Wt Readings from Last 3 Encounters:  01/28/22 151 lb 6.4 oz (68.7 kg)  10/28/21 157 lb 9.6 oz (71.5 kg)  09/27/21 159 lb (72.1 kg)    Physical Exam Vitals reviewed.  Constitutional:      General: She is not in acute distress.    Appearance: She is normal weight. She is ill-appearing. She is not toxic-appearing or diaphoretic.  HENT:     Head: Normocephalic.     Left Ear: There is impacted cerumen.     Nose: Congestion and rhinorrhea present.  Eyes:     General: No scleral icterus.       Right eye: No discharge.        Left eye: No discharge.     Conjunctiva/sclera: Conjunctivae normal.  Cardiovascular:     Rate and Rhythm: Tachycardia present.  Pulmonary:     Effort: Pulmonary effort is normal.     Breath sounds: Normal breath sounds.  Musculoskeletal:        General: Normal range of motion.  Lymphadenopathy:     Cervical: Cervical adenopathy present.  Skin:    General: Skin is warm and dry.  Neurological:     General: No focal deficit present.     Mental Status: She is alert and oriented to person, place, and time. Mental status is at baseline.  Psychiatric:        Mood and Affect:  Mood normal.        Behavior: Behavior normal.        Thought Content: Thought content normal.        Judgment: Judgment normal.     Lab Results  Component Value Date   HGBA1C 6.1 10/28/2021   HGBA1C 5.9 (  H) 02/15/2021   HGBA1C 6.2 09/04/2020    Lab Results  Component Value Date   CREATININE 0.83 10/28/2021   CREATININE 0.90 08/06/2021   CREATININE 1.06 (H) 02/15/2021    Lab Results  Component Value Date   WBC 5.8 10/28/2021   HGB 13.3 10/28/2021   HCT 40.6 10/28/2021   PLT 180.0 10/28/2021   GLUCOSE 91 10/28/2021   CHOL 132 10/28/2021   TRIG 113 10/28/2021   HDL 48 (L) 10/28/2021   LDLDIRECT 111.0 08/13/2016   LDLCALC 64 10/28/2021   ALT 15 10/28/2021   AST 16 10/28/2021   NA 141 10/28/2021   K 3.8 10/28/2021   CL 102 10/28/2021   CREATININE 0.83 10/28/2021   BUN 12 10/28/2021   CO2 29 10/28/2021   TSH 2.023 10/23/2021   INR 2.5 (H) 01/03/2022   HGBA1C 6.1 10/28/2021   MICROALBUR 2.2 (H) 11/13/2017    No results found.  Assessment & Plan:  .Primary hypertension Assessment & Plan: Elevated for the past month.  Increase amlodipine to 10 mg daily . Continue toprol 100 mg , furosemide 20 mg daily and telmisart 80 mg   Orders: -     Microalbumin / creatinine urine ratio  Controlled type 2 diabetes mellitus with microalbuminuria, without long-term current use of insulin ([facility]) -     Microalbumin / creatinine urine ratio -     Comprehensive metabolic panel; Future -     Hemoglobin A1c; Future  Hyperlipidemia associated with type 2 diabetes mellitus ([facility]) -     LDL cholesterol, direct -     Atorvastatin Calcium; Take 1 tablet (40 mg total) by mouth daily.  Dispense: 90 tablet; Refill: 3 -     Lipid panel; Future  Insomnia due to anxiety and fear -     ALPRAZolam; TAKE 1 TABLET BY MOUTH AT BEDTIME AS NEEDED FOR ANXIETY  Dispense: 30 tablet; Refill: 0  Anticoagulation monitoring, INR range 2-3 Assessment & Plan: Taking 5 mg daily of warfarin.  Advised to  skip the dose every 3rd day for the next week to avoid supratherapeutic INR while taking augmentin/prednisone/cheratussin.  INr recheck next week   Orders: -     Apixaban; Take 1 tablet (5 mg total) by mouth 2 (two) times daily.  Dispense: 60 tablet; Refill: 5 -     Protime-INR; Future  Hypertension, unspecified type Assessment & Plan: Elevated for the past month.  Increase amlodipine to 10 mg daily . Continue toprol 100 mg , furosemide 20 mg daily and telmisart 80 mg   Orders: -     amLODIPine Besylate; Take 1 tablet (10 mg total) by mouth daily.  Dispense: 90 tablet; Refill: 1  Acute cough -     POC COVID-19 BinaxNow -     POCT Influenza A/B -     POCT rapid strep A -     Respiratory virus panel  Acute non-recurrent maxillary sinusitis Assessment & Plan: COVID, STREP AND FLU NEGATIVE.  Given multiple symptoms, development of facial pain and exam consistent with bacterial URI,  Will treat with empiric antibiotics, decongestants, and saline lavage.  Adding decongestant  nasal spray otc   Other orders -     Butalbital-APAP-Caffeine; TAKE 1 TABLET BY MOUTH EVERY 12 HOURS AS NEEDED FOR HEADACHE  Dispense: 60 tablet; Refill: 05 -     Telmisartan; Take 1 tablet (80 mg total) by mouth daily.  Dispense: 90 tablet; Refill: 1 -     Amoxicillin-Pot Clavulanate; Take 1 tablet  by mouth 2 (two) times daily.  Dispense: 14 tablet; Refill: 0 -     predniSONE; 6 tablets daily for 3 days, then reduce by 1 tablet daily until gone  Dispense: 33 tablet; Refill: 0 -     Cheratussin AC; Take 5 mLs by mouth 3 (three) times daily as needed for cough.  Dispense: 120 mL; Refill: 0     I provided 30 minutes of face-to-face time during this encounter reviewing patient's last visit with me, patient's  most recent visit with cardiology,  nephrology,  and neurology,  recent surgical and non surgical procedures, previous  labs and imaging studies, counseling on currently addressed issues,  and post visit ordering  to diagnostics and therapeutics .   Follow-up: No follow-ups on file.   Crecencio Mc, MD

## 2022-01-28 NOTE — Assessment & Plan Note (Signed)
Elevated for the past month.  Increase amlodipine to 10 mg daily . Continue toprol 100 mg , furosemide 20 mg daily and telmisart 80 mg

## 2022-01-28 NOTE — Assessment & Plan Note (Signed)
Taking 5 mg daily of warfarin.  Advised to skip the dose every 3rd day for the next week to avoid supratherapeutic INR while taking augmentin/prednisone/cheratussin.  INr recheck next week

## 2022-01-30 LAB — RESPIRATORY VIRUS PANEL
Adenovirus B: NOT DETECTED
HUMAN PARAINFLU VIRUS 1: NOT DETECTED
HUMAN PARAINFLU VIRUS 2: NOT DETECTED
HUMAN PARAINFLU VIRUS 3: DETECTED — AB
INFLUENZA A SUBTYPE H1: NOT DETECTED
INFLUENZA A SUBTYPE H3: NOT DETECTED
Influenza A: NOT DETECTED
Influenza B: NOT DETECTED
Metapneumovirus: DETECTED — AB
Respiratory Syncytial Virus A: NOT DETECTED
Respiratory Syncytial Virus B: NOT DETECTED
Rhinovirus: NOT DETECTED

## 2022-01-31 ENCOUNTER — Telehealth: Payer: Self-pay | Admitting: Internal Medicine

## 2022-01-31 NOTE — Telephone Encounter (Signed)
Called and spoke to pt and she said that she would get her labs next week when she comes in.

## 2022-01-31 NOTE — Telephone Encounter (Signed)
Pt would like to be called If to see if she was supposed to get lab work on her visit last tuesday

## 2022-02-04 ENCOUNTER — Other Ambulatory Visit (INDEPENDENT_AMBULATORY_CARE_PROVIDER_SITE_OTHER): Payer: PPO

## 2022-02-04 DIAGNOSIS — E1169 Type 2 diabetes mellitus with other specified complication: Secondary | ICD-10-CM | POA: Diagnosis not present

## 2022-02-04 DIAGNOSIS — R809 Proteinuria, unspecified: Secondary | ICD-10-CM

## 2022-02-04 DIAGNOSIS — E785 Hyperlipidemia, unspecified: Secondary | ICD-10-CM

## 2022-02-04 DIAGNOSIS — Z7901 Long term (current) use of anticoagulants: Secondary | ICD-10-CM | POA: Diagnosis not present

## 2022-02-04 DIAGNOSIS — E1129 Type 2 diabetes mellitus with other diabetic kidney complication: Secondary | ICD-10-CM

## 2022-02-04 LAB — HEMOGLOBIN A1C: Hgb A1c MFr Bld: 6.1 % (ref 4.6–6.5)

## 2022-02-04 LAB — COMPREHENSIVE METABOLIC PANEL
ALT: 44 U/L — ABNORMAL HIGH (ref 0–35)
AST: 23 U/L (ref 0–37)
Albumin: 3.9 g/dL (ref 3.5–5.2)
Alkaline Phosphatase: 79 U/L (ref 39–117)
BUN: 22 mg/dL (ref 6–23)
CO2: 29 mEq/L (ref 19–32)
Calcium: 8.7 mg/dL (ref 8.4–10.5)
Chloride: 105 mEq/L (ref 96–112)
Creatinine, Ser: 0.99 mg/dL (ref 0.40–1.20)
GFR: 56.83 mL/min — ABNORMAL LOW (ref >60.00)
Glucose, Bld: 107 mg/dL — ABNORMAL HIGH (ref 70–99)
Potassium: 3.7 mEq/L (ref 3.5–5.1)
Sodium: 142 mEq/L (ref 135–145)
Total Bilirubin: 0.6 mg/dL (ref 0.2–1.2)
Total Protein: 5.7 g/dL — ABNORMAL LOW (ref 6.0–8.3)

## 2022-02-04 LAB — LIPID PANEL
Cholesterol: 137 mg/dL (ref 0–200)
HDL: 49.9 mg/dL (ref >39.00)
LDL Cholesterol: 63 mg/dL (ref 0–99)
NonHDL: 86.61
Total CHOL/HDL Ratio: 3
Triglycerides: 120 mg/dL (ref 0.0–149.0)
VLDL: 24 mg/dL (ref 0.0–40.0)

## 2022-02-04 LAB — PROTIME-INR
INR: 1.8 ratio — ABNORMAL HIGH (ref 0.8–1.0)
Prothrombin Time: 18.9 s — ABNORMAL HIGH (ref 9.6–13.1)

## 2022-02-06 DIAGNOSIS — H26491 Other secondary cataract, right eye: Secondary | ICD-10-CM | POA: Diagnosis not present

## 2022-02-16 ENCOUNTER — Other Ambulatory Visit: Payer: Self-pay | Admitting: Internal Medicine

## 2022-02-18 MED ORDER — TRAZODONE HCL 50 MG PO TABS: 50.0000 mg | ORAL_TABLET | Freq: Every day | ORAL | 0 refills | Status: DC

## 2022-02-19 ENCOUNTER — Encounter: Payer: Self-pay | Admitting: Internal Medicine

## 2022-02-19 ENCOUNTER — Other Ambulatory Visit (INDEPENDENT_AMBULATORY_CARE_PROVIDER_SITE_OTHER): Payer: PPO

## 2022-02-19 DIAGNOSIS — Z7901 Long term (current) use of anticoagulants: Secondary | ICD-10-CM | POA: Diagnosis not present

## 2022-02-19 LAB — PROTIME-INR
INR: 2.9 ratio — ABNORMAL HIGH (ref 0.8–1.0)
Prothrombin Time: 30.3 s — ABNORMAL HIGH (ref 9.6–13.1)

## 2022-02-21 NOTE — Assessment & Plan Note (Signed)
Coumadin level is therapeutic, but on the high side .  If you have been taking 5 mg daily   please reduce dose to 4 mg two days per week starting tonight, and Continue 5 mg  5 days per week and  repeat PT/INR in 2 weeks   Lab Results  Component Value Date   INR 2.9 (H) 02/19/2022   INR 1.8 (H) 02/04/2022   INR 2.5 (H) 01/03/2022   PROTIME 57.9 (A) 02/17/2014

## 2022-03-14 ENCOUNTER — Other Ambulatory Visit (INDEPENDENT_AMBULATORY_CARE_PROVIDER_SITE_OTHER): Payer: PPO

## 2022-03-14 DIAGNOSIS — Z7901 Long term (current) use of anticoagulants: Secondary | ICD-10-CM

## 2022-03-14 LAB — PROTIME-INR
INR: 2 ratio — ABNORMAL HIGH (ref 0.8–1.0)
Prothrombin Time: 21.5 s — ABNORMAL HIGH (ref 9.6–13.1)

## 2022-03-18 ENCOUNTER — Encounter: Payer: Self-pay | Admitting: Internal Medicine

## 2022-03-21 ENCOUNTER — Other Ambulatory Visit: Payer: Self-pay | Admitting: Internal Medicine

## 2022-03-21 ENCOUNTER — Encounter: Payer: Self-pay | Admitting: Internal Medicine

## 2022-03-21 DIAGNOSIS — I10 Essential (primary) hypertension: Secondary | ICD-10-CM

## 2022-03-21 MED ORDER — HYDROCHLOROTHIAZIDE 25 MG PO TABS: 25.0000 mg | ORAL_TABLET | Freq: Every day | ORAL | 3 refills | Status: DC

## 2022-03-21 MED ORDER — AMLODIPINE BESYLATE 5 MG PO TABS: 5.0000 mg | ORAL_TABLET | Freq: Every day | ORAL | 0 refills | Status: DC

## 2022-03-21 NOTE — Assessment & Plan Note (Signed)
Reducing amlodipine to 5 mg due to edema and adding hctz.  OV needed to begin workup  for resistant hypertension

## 2022-03-25 NOTE — Telephone Encounter (Signed)
Pt called in regards of previous message. The earliest appt that was available w/ [facility] was 4/22 @3pm .

## 2022-03-26 NOTE — Telephone Encounter (Signed)
LMTCB. Please transfer pt to [facility], CMA.

## 2022-03-26 NOTE — Telephone Encounter (Signed)
Pt called back and sooner appt has been scheduled.

## 2022-04-08 ENCOUNTER — Ambulatory Visit (INDEPENDENT_AMBULATORY_CARE_PROVIDER_SITE_OTHER): Payer: PPO | Admitting: Internal Medicine

## 2022-04-08 VITALS — BP 120/68 | HR 53 | Temp 98.3°F | Ht 61.0 in | Wt 156.8 lb

## 2022-04-08 DIAGNOSIS — I1A0 Resistant hypertension: Secondary | ICD-10-CM

## 2022-04-08 DIAGNOSIS — F4329 Adjustment disorder with other symptoms: Secondary | ICD-10-CM

## 2022-04-08 MED ORDER — WARFARIN SODIUM 4 MG PO TABS: 4.0000 mg | ORAL_TABLET | Freq: Every day | ORAL | 1 refills | Status: DC

## 2022-04-08 NOTE — Progress Notes (Signed)
Subjective:  Patient ID: [name], female    DOB: [birth year]  Age: 72 y.o. 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 A diagnosis of Grief reaction with prolonged bereavement was also pertinent to this visit.   HPI [name] presents for  Chief Complaint  Patient presents with   Leg Swelling    Follow up better since decreasing amlodipine    1) HTN:  since reducing her amlodipine dose to 5 mg and adding hctz,  edema has improved and BP readings are ranging from 99991111 systolic to 123456 systolic .  Most are 99991111 systolic ,  pulse is ranging from 55 to 62  on metoprolol 100 mg daily  taking telmisartan 80 mg as well.  Reviewed diet:  salts food lightly  cooking,  but not at table , snores,  no prior sleep study or renal artery doppler   2) Grief:  makes small progress toward letting go.  Having trouble continues to   Outpatient Medications Prior to Visit  Medication Sig Dispense Refill   ALPRAZolam (XANAX) 0.25 MG tablet TAKE 1 TABLET BY MOUTH AT BEDTIME AS NEEDED FOR ANXIETY 30 tablet 0   amLODipine (NORVASC) 5 MG tablet Take 1 tablet (5 mg total) by mouth daily. 90 tablet 0   aspirin 81 MG tablet Take 81 mg by mouth daily.     atorvastatin (LIPITOR) 40 MG tablet Take 1 tablet (40 mg total) by mouth daily. 90 tablet 3   buPROPion (WELLBUTRIN XL) 150 MG 24 hr tablet Take 1 tablet by mouth once daily 90 tablet 1   butalbital-acetaminophen-caffeine (FIORICET) 50-325-40 MG tablet TAKE 1 TABLET BY MOUTH EVERY 12 HOURS AS NEEDED FOR HEADACHE 60 tablet 05   Cholecalciferol (VITAMIN D3) 1000 UNITS CAPS Take by mouth.     cyanocobalamin (VITAMIN B12) 1000 MCG/ML injection INJECT 1ML INTO THE MUSCLE EVERY 30 DAYS 10 mL 0   furosemide (LASIX) 20 MG tablet Take 1 tablet by mouth once daily (Patient taking differently: Take 20 mg by mouth 2 (two) times a week. Tuesday and Friday) 90 tablet 0   glucose blood test strip Use to check blood sugar once daily. 100  each 5   hydrochlorothiazide (HYDRODIURIL) 25 MG tablet Take 1 tablet (25 mg total) by mouth daily. 90 tablet 3   metoprolol succinate (TOPROL-XL) 100 MG 24 hr tablet TAKE 1 TABLET BY MOUTH AT BEDTIME (TAKE  WITH  OR  IMMEDIATELY  FOLLOWING  A  MEAL) 90 tablet 1   Omega-3 Fatty Acids (FISH OIL) 1000 MG CAPS Take 1 capsule daily by mouth.     OneTouch Delica Lancets 99991111 MISC Use to check blood sugar once daily. 100 each 5   Syringe/Needle, Disp, (SYRINGE 3CC/25GX1") 25G X 1" 3 ML MISC Use for b12 injections 50 each 0   telmisartan (MICARDIS) 80 MG tablet Take 1 tablet (80 mg total) by mouth daily. 90 tablet 1   traZODone (DESYREL) 50 MG tablet Take 1 tablet (50 mg total) by mouth at bedtime. 90 tablet 0   triamcinolone cream (KENALOG) 0.1 % Apply 1 application topically 2 (two) times daily. 30 g 0   warfarin (COUMADIN) 5 MG tablet Take 1 tablet (5 mg total) by mouth daily. (Patient taking differently: Take 5 mg by mouth daily. All but Mon and Thu) 30 tablet 3   warfarin (COUMADIN) 4 MG tablet Take 4 mg by mouth daily. Takes Mon and Thu; other days 5mg   ARIPiprazole (ABILIFY) 2 MG tablet Take 1 tablet (2 mg total) by mouth daily. (Patient not taking: Reported on 04/08/2022) 30 tablet 2   ARIPiprazole (ABILIFY) 2 MG tablet Take 1 tablet (2 mg total) by mouth daily. (Patient not taking: Reported on 04/08/2022) 90 tablet 1   amoxicillin-clavulanate (AUGMENTIN) 875-125 MG tablet Take 1 tablet by mouth 2 (two) times daily. (Patient not taking: Reported on 04/08/2022) 14 tablet 0   apixaban (ELIQUIS) 5 MG TABS tablet Take 1 tablet (5 mg total) by mouth 2 (two) times daily. 60 tablet 5   guaiFENesin-codeine (CHERATUSSIN AC) 100-10 MG/5ML syrup Take 5 mLs by mouth 3 (three) times daily as needed for cough. (Patient not taking: Reported on 04/08/2022) 120 mL 0   LINZESS 72 MCG capsule Take 72 mcg by mouth daily. (Patient not taking: Reported on 04/08/2022)     nitroGLYCERIN (NITROSTAT) 0.4 MG SL tablet Place 1 tablet  (0.4 mg total) under the tongue every 5 (five) minutes as needed for chest pain. (Patient not taking: Reported on 04/08/2022) 50 tablet 3   predniSONE (DELTASONE) 10 MG tablet 6 tablets daily for 3 days, then reduce by 1 tablet daily until gone (Patient not taking: Reported on 04/08/2022) 33 tablet 0   warfarin (COUMADIN) 1 MG tablet Take 1 tablet (1 mg total) by mouth daily. With 6 mg .daily.  Total dose 7 mg (Patient not taking: Reported on 04/08/2022) 90 tablet 1   warfarin (COUMADIN) 6 MG tablet Take 1 tablet (6 mg total) by mouth daily. With 1 mg  total dose 7 mg (Patient not taking: Reported on 04/08/2022) 90 tablet 0   Facility-Administered Medications Prior to Visit  Medication Dose Route Frequency Provider Last Rate Last Admin   cyanocobalamin ((VITAMIN B-12)) injection 1,000 mcg  1,000 mcg Intramuscular Once Crecencio Mc, MD        Review of Systems;  Patient denies headache, fevers, malaise, unintentional weight loss, skin rash, eye pain, sinus congestion and sinus pain, sore throat, dysphagia,  hemoptysis , cough, dyspnea, wheezing, chest pain, palpitations, orthopnea, edema, abdominal pain, nausea, melena, diarrhea, constipation, flank pain, dysuria, hematuria, urinary  Frequency, nocturia, numbness, tingling, seizures,  Focal weakness, Loss of consciousness,  Tremor, insomnia, depression, anxiety, and suicidal ideation.      Objective:  BP 120/68   Pulse (!) 53   Temp 98.3 F (36.8 C) (Oral)   Ht 5\' 1"  (1.549 m)   Wt 156 lb 12.8 oz (71.1 kg)   SpO2 95%   BMI 29.63 kg/m   BP Readings from Last 3 Encounters:  04/08/22 120/68  01/28/22 (!) 156/88  10/28/21 (!) 140/64    Wt Readings from Last 3 Encounters:  04/08/22 156 lb 12.8 oz (71.1 kg)  01/28/22 151 lb 6.4 oz (68.7 kg)  10/28/21 157 lb 9.6 oz (71.5 kg)    Physical Exam Vitals reviewed.  Constitutional:      General: She is not in acute distress.    Appearance: Normal appearance. She is normal weight. She is not  ill-appearing, toxic-appearing or diaphoretic.  HENT:     Head: Normocephalic.  Eyes:     General: No scleral icterus.       Right eye: No discharge.        Left eye: No discharge.     Conjunctiva/sclera: Conjunctivae normal.  Cardiovascular:     Rate and Rhythm: Normal rate and regular rhythm.     Heart sounds: Normal heart sounds.  Pulmonary:     Effort:  Pulmonary effort is normal. No respiratory distress.     Breath sounds: Normal breath sounds.  Musculoskeletal:        General: Normal range of motion.  Skin:    General: Skin is warm and dry.  Neurological:     General: No focal deficit present.     Mental Status: She is alert and oriented to person, place, and time. Mental status is at baseline.  Psychiatric:        Mood and Affect: Mood normal.        Behavior: Behavior normal.        Thought Content: Thought content normal.        Judgment: Judgment normal.    Lab Results  Component Value Date   HGBA1C 6.1 02/04/2022   HGBA1C 6.1 10/28/2021   HGBA1C 5.9 (H) 02/15/2021    Lab Results  Component Value Date   CREATININE 0.99 02/04/2022   CREATININE 0.83 10/28/2021   CREATININE 0.90 08/06/2021    Lab Results  Component Value Date   WBC 5.8 10/28/2021   HGB 13.3 10/28/2021   HCT 40.6 10/28/2021   PLT 180.0 10/28/2021   GLUCOSE 107 (H) 02/04/2022   CHOL 137 02/04/2022   TRIG 120.0 02/04/2022   HDL 49.90 02/04/2022   LDLDIRECT 111.0 08/13/2016   LDLCALC 63 02/04/2022   ALT 44 (H) 02/04/2022   AST 23 02/04/2022   NA 142 02/04/2022   K 3.7 02/04/2022   CL 105 02/04/2022   CREATININE 0.99 02/04/2022   BUN 22 02/04/2022   CO2 29 02/04/2022   TSH 2.023 10/23/2021   INR 2.0 (H) 03/14/2022   HGBA1C 6.1 02/04/2022   MICROALBUR 2.2 (H) 11/13/2017    No results found.  Assessment & Plan:  .Resistant hypertension Assessment & Plan: She has required a 4th agent to lower BP.  Screening for secondary causes recommended and ordered  Orders: -     Home sleep  test -     US RENAL ARTERY DUPLEX COMPLETE; Future  Grief reaction with prolonged bereavement Assessment & Plan: Slight improvement with addition of abilify to wellbutrin .  encouarged to double her Abilify dose    Other orders -     Warfarin Sodium; Take 1 tablet (4 mg total) by mouth daily. Takes Mon and Thu; other days 5mg   Dispense: 30 tablet; Refill: 1     I provided 30 minutes of face-to-face time during this encounter reviewing patient's last visit with me, patient's  most recent visit with cardiology,  nephrology,  and neurology,  recent surgical and non surgical procedures, previous  labs and imaging studies, counseling on currently addressed issues,  and post visit ordering to diagnostics and therapeutics .   Follow-up: No follow-ups on file.   Crecencio Mc, MD

## 2022-04-08 NOTE — Assessment & Plan Note (Signed)
She has required a 4th agent to lower BP.  Screening for secondary causes recommended and ordered

## 2022-04-08 NOTE — Patient Instructions (Signed)
Continue your current regimen:  5 mg amlodipine 25 mg hctz 100 mg metoprolol 80 mg telmisartan  Home Sleep study and renal artery doppler /ultrasound have  been ordered to rule out secondary causes of resistant hypertension

## 2022-04-08 NOTE — Assessment & Plan Note (Signed)
Slight improvement with addition of abilify to wellbutrin .  encouarged to double her Abilify dose  

## 2022-04-10 ENCOUNTER — Telehealth: Payer: Self-pay | Admitting: Internal Medicine

## 2022-04-10 NOTE — Telephone Encounter (Signed)
Lft pt vm to call to ofc to sch the [place]. thanks

## 2022-04-18 ENCOUNTER — Ambulatory Visit
Admission: RE | Admit: 2022-04-18 | Discharge: 2022-04-18 | Disposition: A | Discharge status: Home / Self Care | Payer: PPO | Source: Ambulatory Visit | Attending: Internal Medicine | Admitting: Internal Medicine

## 2022-04-18 DIAGNOSIS — I10 Essential (primary) hypertension: Secondary | ICD-10-CM | POA: Insufficient documentation

## 2022-04-18 DIAGNOSIS — I700 Atherosclerosis of aorta: Secondary | ICD-10-CM | POA: Insufficient documentation

## 2022-04-18 DIAGNOSIS — I1A0 Resistant hypertension: Secondary | ICD-10-CM | POA: Diagnosis not present

## 2022-04-28 ENCOUNTER — Ambulatory Visit: Payer: PPO | Admitting: Internal Medicine

## 2022-04-30 ENCOUNTER — Other Ambulatory Visit: Payer: PPO

## 2022-05-02 ENCOUNTER — Other Ambulatory Visit (INDEPENDENT_AMBULATORY_CARE_PROVIDER_SITE_OTHER): Payer: PPO

## 2022-05-02 DIAGNOSIS — Z7901 Long term (current) use of anticoagulants: Secondary | ICD-10-CM

## 2022-05-02 NOTE — Addendum Note (Signed)
Addended by: Warden Fillers on: 05/02/2022 02:22 PM   Modules accepted: Orders

## 2022-05-03 LAB — PROTIME-INR
INR: 2.6 — ABNORMAL HIGH (ref 0.9–1.2)
Prothrombin Time: 26.8 s — ABNORMAL HIGH (ref 9.1–12.0)

## 2022-05-03 LAB — SPECIMEN STATUS REPORT

## 2022-05-04 ENCOUNTER — Encounter: Payer: Self-pay | Admitting: Internal Medicine

## 2022-05-05 ENCOUNTER — Other Ambulatory Visit: Payer: Self-pay | Admitting: Internal Medicine

## 2022-05-06 ENCOUNTER — Telehealth: Payer: Self-pay | Admitting: Internal Medicine

## 2022-05-06 NOTE — Telephone Encounter (Signed)
Contacted [name] to schedule their annual wellness visit. Appointment made for 05/19/2022.  Verlee Rossetti; Care Guide Ambulatory Clinical Support [place] [facility] Direct Dial: [phone]

## 2022-05-12 ENCOUNTER — Other Ambulatory Visit: Payer: Self-pay | Admitting: Internal Medicine

## 2022-05-19 ENCOUNTER — Ambulatory Visit (INDEPENDENT_AMBULATORY_CARE_PROVIDER_SITE_OTHER): Payer: PPO

## 2022-05-19 VITALS — Wt 156.0 lb

## 2022-05-19 DIAGNOSIS — Z0000 Encounter for general adult medical examination without abnormal findings: Secondary | ICD-10-CM

## 2022-05-19 NOTE — Progress Notes (Addendum)
Subjective:   [name] is a 72 y.o. female who presents for Medicare Annual (Subsequent) preventive examination.  Review of Systems    I connected with  [name] on 05/19/22 by a audio enabled telemedicine application and verified that I am speaking with the correct person using two identifiers.  Patient Location: Home  Provider Location: Home Office  I discussed the limitations of evaluation and management by telemedicine. The patient expressed understanding and agreed to proceed.  Cardiac Risk Factors include: advanced age (>53men, >71 women);hypertension     Objective:    Today's Vitals   05/19/22 1431  Weight: 156 lb (70.8 kg)   Body mass index is 29.48 kg/m.     05/19/2022    2:38 PM 07/12/2021    7:53 AM 05/08/2021    3:29 PM 04/30/2020   11:57 AM 06/20/2019    8:37 AM 05/30/2019    6:42 AM 04/28/2019   11:14 AM  Advanced Directives  Does Patient Have a Medical Advance Directive? Yes Yes No Yes Yes No No  Type of Estate agent of [place];Living will   Healthcare Power of [place];Living will Healthcare Power of [place];Living will    Does patient want to make changes to medical advance directive?    No - Patient declined No - Patient declined  Yes (MAU/Ambulatory/Procedural Areas - Information given)  Copy of Healthcare Power of Attorney in Chart? No - copy requested   No - copy requested No - copy requested    Would patient like information on creating a medical advance directive?   No - Patient declined  No - Patient declined No - Patient declined     Current Medications (verified) Outpatient Encounter Medications as of 05/19/2022  Medication Sig   ALPRAZolam (XANAX) 0.25 MG tablet TAKE 1 TABLET BY MOUTH AT BEDTIME AS NEEDED FOR ANXIETY   amLODipine (NORVASC) 5 MG tablet Take 1 tablet (5 mg total) by mouth daily.   ARIPiprazole (ABILIFY) 2 MG tablet Take 1 tablet (2 mg total) by mouth daily.   ARIPiprazole (ABILIFY) 2 MG tablet Take 1  tablet (2 mg total) by mouth daily.   aspirin 81 MG tablet Take 81 mg by mouth daily.   atorvastatin (LIPITOR) 40 MG tablet Take 1 tablet (40 mg total) by mouth daily.   buPROPion (WELLBUTRIN XL) 150 MG 24 hr tablet Take 1 tablet by mouth once daily   butalbital-acetaminophen-caffeine (FIORICET) 50-325-40 MG tablet TAKE 1 TABLET BY MOUTH EVERY 12 HOURS AS NEEDED FOR HEADACHE   Cholecalciferol (VITAMIN D3) 1000 UNITS CAPS Take by mouth.   cyanocobalamin (VITAMIN B12) 1000 MCG/ML injection INJECT INTO THE MUSCLE EVERY 30 DAYS   furosemide (LASIX) 20 MG tablet Take 1 tablet by mouth once daily (Patient taking differently: Take 20 mg by mouth 2 (two) times a week. Tuesday and Friday)   glucose blood test strip Use to check blood sugar once daily.   hydrochlorothiazide (HYDRODIURIL) 25 MG tablet Take 1 tablet (25 mg total) by mouth daily.   metoprolol succinate (TOPROL-XL) 100 MG 24 hr tablet TAKE 1 TABLET BY MOUTH AT BEDTIME (TAKE  WITH  OR  IMMEDIATELY  FOLLOWING  A  MEAL)   Omega-3 Fatty Acids (FISH OIL) 1000 MG CAPS Take 1 capsule daily by mouth.   OneTouch Delica Lancets 33G MISC Use to check blood sugar once daily.   Syringe/Needle, Disp, (SYRINGE 3CC/25GX1") 25G X 1" 3 ML MISC Use for b12 injections   telmisartan (MICARDIS)  80 MG tablet Take 1 tablet (80 mg total) by mouth daily.   traZODone (DESYREL) 50 MG tablet TAKE 1 TABLET BY MOUTH AT BEDTIME   triamcinolone cream (KENALOG) 0.1 % Apply 1 application topically 2 (two) times daily.   warfarin (COUMADIN) 4 MG tablet Take 1 tablet (4 mg total) by mouth daily. Takes Mon and Thu; other days 5mg    warfarin (COUMADIN) 5 MG tablet Take 1 tablet (5 mg total) by mouth daily. (Patient taking differently: Take 5 mg by mouth daily. All but Mon and Thu)   Facility-Administered Encounter Medications as of 05/19/2022  Medication   cyanocobalamin ((VITAMIN B-12)) injection 1,000 mcg    Allergies (verified) Patient has no known allergies.    History: Past Medical History:  Diagnosis Date   B12 deficiency    Chronic ischemic colitis, enteritis, or enterocolitis (HCC) 04/08/2011   S/p right colectomy May 2013, [place]   Clotting disorder [facility])    Depression    Diabetes mellitus without complication (HCC)    pre diabetic   Fire ant bite 05/15/2020   GERD (gastroesophageal reflux disease)    H/O toxic shock syndrome 2001   post urologic procedure for stone removal   Headache    migraines   History of pulmonary embolus (PE) Sept 2011   secondary to Protein c& S deficiency, Lupus ACA   HOH (hard of hearing)    wears hearing aids   Hyperlipidemia    Hypertension    Ischemic colitis (HCC) 2012-2013   s/p left colectomy May 2013   Ischemic colitis (HCC)    Kidney stones    PONV (postoperative nausea and vomiting)    Past Surgical History:  Procedure Laterality Date   ABDOMINAL HYSTERECTOMY  1996   APPENDECTOMY     bilateral tubal ligation     BREAST EXCISIONAL BIOPSY Left 1989   benign   BREAST SURGERY  1989   left biopsy, normal   CATARACT EXTRACTION W/PHACO Right 05/30/2019   Procedure: CATARACT EXTRACTION PHACO AND INTRAOCULAR LENS PLACEMENT (IOC) RIGHT DIABETIC;  Surgeon: Nevada Crane, MD;  Location: [facility];  Service: Ophthalmology;  Laterality: Right;  3.84 0:37.1   CATARACT EXTRACTION W/PHACO Left 06/20/2019   Procedure: CATARACT EXTRACTION PHACO AND INTRAOCULAR LENS PLACEMENT (IOC) LEFT DIABETIC 2.58  00:29.3;  Surgeon: Nevada Crane, MD;  Location: [facility];  Service: Ophthalmology;  Laterality: Left;  Diabetic   COLECTOMY Left May 2013   [place], ischemic colitis   COLONOSCOPY WITH PROPOFOL N/A 11/16/2014   Procedure: COLONOSCOPY WITH PROPOFOL;  Surgeon: Scot Jun, MD;  Location: [facility] ENDOSCOPY;  Service: Endoscopy;  Laterality: N/A;   COLONOSCOPY WITH PROPOFOL N/A 07/12/2021   Procedure: COLONOSCOPY WITH PROPOFOL;  Surgeon: Regis Bill, MD;  Location: [facility] ENDOSCOPY;   Service: Endoscopy;  Laterality: N/A;   ESOPHAGOGASTRODUODENOSCOPY (EGD) WITH PROPOFOL N/A 11/16/2014   Procedure: ESOPHAGOGASTRODUODENOSCOPY (EGD) WITH PROPOFOL;  Surgeon: Scot Jun, MD;  Location: [facility] ENDOSCOPY;  Service: Endoscopy;  Laterality: N/A;   ESOPHAGOGASTRODUODENOSCOPY (EGD) WITH PROPOFOL N/A 07/12/2021   Procedure: ESOPHAGOGASTRODUODENOSCOPY (EGD) WITH PROPOFOL;  Surgeon: Regis Bill, MD;  Location: [facility] ENDOSCOPY;  Service: Endoscopy;  Laterality: N/A;   SPINE SURGERY  1984   lumbar diskectomy, [place]   ureterolithotomy     Dr. Artis Flock urology ~ 2012 kidney stones in ICU after surgery   Family History  Problem Relation Age of Onset   Hypertension Mother    Cancer Mother    Diabetes Mother  Congestive Heart Failure Mother    Heart disease Father    Hypertension Father    Diabetes Father    Alzheimer's disease Sister    Breast cancer Sister 64       DCIS   Breast cancer Paternal Aunt        <50   Breast cancer Cousin    Social History   Socioeconomic History   Marital status: Widowed    Spouse name: Not on file   Number of children: Not on file   Years of education: Not on file   Highest education level: Not on file  Occupational History   Not on file  Tobacco Use   Smoking status: Never   Smokeless tobacco: Never  Vaping Use   Vaping Use: Never used  Substance and Sexual Activity   Alcohol use: No   Drug use: No   Sexual activity: Yes  Other Topics Concern   Not on file  Social History Narrative   Not on file   Social Determinants of Health   Financial Resource Strain: Low Risk  (05/19/2022)   Overall Financial Resource Strain (CARDIA)    Difficulty of Paying Living Expenses: Not hard at all  Food Insecurity: No Food Insecurity (05/19/2022)   Hunger Vital Sign    Worried About Running Out of Food in the Last Year: Never true    Ran Out of Food in the Last Year: Never true  Transportation Needs: No Transportation Needs (05/19/2022)    PRAPARE - Administrator, Civil Service (Medical): No    Lack of Transportation (Non-Medical): No  Physical Activity: Sufficiently Active (05/19/2022)   Exercise Vital Sign    Days of Exercise per Week: 7 days    Minutes of Exercise per Session: 30 min  Stress: No Stress Concern Present (05/19/2022)   [employer] of Occupational Health - Occupational Stress Questionnaire    Feeling of Stress : Not at all  Social Connections: Moderately Integrated (05/19/2022)   Social Connection and Isolation Panel [NHANES]    Frequency of Communication with Friends and Family: More than three times a week    Frequency of Social Gatherings with Friends and Family: More than three times a week    Attends Religious Services: More than 4 times per year    Active Member of [employer] or Organizations: Yes    Attends Banker Meetings: More than 4 times per year    Marital Status: Widowed    Tobacco Counseling Counseling given: Not Answered   Clinical Intake:  Pre-visit preparation completed: Yes  Pain : No/denies pain     BMI - recorded: 29.48 Nutritional Status: BMI 25 -29 Overweight Nutritional Risks: None Diabetes: No  How often do you need to have someone help you when you read instructions, pamphlets, or other written materials from your doctor or pharmacy?: 1 - Never  Diabetic?No  Interpreter Needed?: No  Information entered by :: Annabell Sabal, CMA   Activities of Daily Living    05/19/2022    2:39 PM  In your present state of health, do you have any difficulty performing the following activities:  Hearing? 1  Vision? 1  Difficulty concentrating or making decisions? 0  Walking or climbing stairs? 0  Dressing or bathing? 0  Doing errands, shopping? 0  Preparing Food and eating ? N  Using the Toilet? N  In the past six months, have you accidently leaked urine? Y  Do you have problems with loss of  bowel control? N  Managing your Medications? N   Managing your Finances? N  Housekeeping or managing your Housekeeping? N    Patient Care Team: Sherlene Shams, MD as PCP - General (Internal Medicine) Debbe Odea, MD as PCP - Cardiology (Cardiology) Scot Jun, MD (Inactive) (Gastroenterology)  Indicate any recent Medical Services you may have received from other than [facility] providers in the past year (date may be approximate).     Assessment:   This is a routine wellness examination for [name].  Hearing/Vision screen Hearing Screening - Comments:: Patient wears hearing aides Vision Screening - Comments:: Wears rx glasses - up to date with [facility] exams with  [facility]  Dietary issues and exercise activities discussed: Current Exercise Habits: Home exercise routine, Type of exercise: walking (yard work), Time (Minutes): 60, Frequency (Times/Week): 2, Weekly Exercise (Minutes/Week): 120, Intensity: Moderate, Exercise limited by: None identified   Goals Addressed               This Visit's Progress     Patient Stated     Maintain Healthy Lifestyle (pt-stated)   On track     Eat healthy. Stay active; walk for exercise.       Depression Screen    05/19/2022    2:35 PM 04/08/2022    4:21 PM 01/28/2022    3:26 PM 09/27/2021    5:31 PM 05/08/2021    3:33 PM 02/15/2021    2:56 PM 09/04/2020    2:14 PM  PHQ 2/9 Scores  PHQ - 2 Score 0 2 2 2  0 0 3  PHQ- 9 Score 0 8 6 18   12     Fall Risk    05/19/2022    2:39 PM 04/08/2022    4:20 PM 01/28/2022    3:26 PM 09/27/2021    4:38 PM 05/08/2021    3:33 PM  Fall Risk   Falls in the past year? 0 1 0 0 0  Number falls in past yr: 0 0 0  0  Injury with Fall? 0 0 0    Risk for fall due to : No Fall Risks History of fall(s) No Fall Risks No Fall Risks   Follow up Falls prevention discussed;Falls evaluation completed Falls evaluation completed Falls evaluation completed Falls evaluation completed Falls evaluation completed    FALL RISK PREVENTION PERTAINING TO  THE HOME:  Any stairs in or around the home? Yes  If so, are there any without handrails? Yes  Home free of loose throw rugs in walkways, pet beds, electrical cords, etc? No  Adequate lighting in your home to reduce risk of falls? Yes   ASSISTIVE DEVICES UTILIZED TO PREVENT FALLS:  Life alert? No  Use of a cane, walker or w/c? No  Grab bars in the bathroom? Yes  Shower chair or bench in shower? Yes  Elevated toilet seat or a handicapped toilet? Yes   TIMED UP AND GO:  Was the test performed?  No televisit .     Cognitive Function:    04/28/2019   11:27 AM 03/23/2017    3:55 PM 03/21/2016    3:39 PM  MMSE - Mini Mental State Exam  Not completed: Unable to complete    Orientation to time  5 5  Orientation to Place  5 5  Registration  3 3  Attention/ Calculation  5 5  Recall  3 3  Language- name 2 objects  2 2  Language- repeat  1 1  Language- follow 3 step command  3 3  Language- read & follow direction  1 1  Write a sentence  1 1  Copy design  1 1  Total score  30 30        05/19/2022    2:36 PM 04/27/2018   11:27 AM  6CIT Screen  What Year? 0 points 0 points  What month? 0 points 0 points  What time? 0 points 0 points  Count back from 20 2 points 0 points  Months in reverse 0 points 0 points  Repeat phrase 0 points 0 points  Total Score 2 points 0 points    Immunizations Immunization History  Administered Date(s) Administered   Fluad Quad(high Dose 65+) 09/17/2018, 10/05/2019, 10/28/2021   Influenza, High Dose Seasonal PF 09/03/2015, 11/21/2016, 11/13/2017   Influenza,inj,Quad PF,6+ Mos 10/27/2012, 11/02/2013   Influenza-Unspecified 11/07/2014   PFIZER(Purple Top)SARS-COV-2 Vaccination 03/02/2019, 03/23/2019   Pneumococcal Conjugate-13 05/19/2014   Pneumococcal Polysaccharide-23 05/31/2015   Pneumococcal-Unspecified 12/07/2015   Tdap 10/27/2012    TDAP status: Up to date  Flu Vaccine status: Up to date  Pneumococcal vaccine status: Up to  date  Covid-19 vaccine status: Completed vaccines  Qualifies for Shingles Vaccine? Yes   Zostavax completed No   Shingrix Completed?: No.    Education has been provided regarding the importance of this vaccine. Patient has been advised to call insurance company to determine out of pocket expense if they have not yet received this vaccine. Advised may also receive vaccine at local pharmacy or Health Dept. Verbalized acceptance and understanding.  Screening Tests Health Maintenance  Topic Date Due   Diabetic kidney evaluation - Urine ACR  05/14/2021   FOOT EXAM  05/14/2021   COVID-19 Vaccine (3 - Pfizer risk series) 06/04/2022 (Originally 04/20/2019)   Zoster Vaccines- Shingrix (1 of 2) 05/19/2023 (Originally 03/31/1969)   HEMOGLOBIN A1C  08/05/2022   INFLUENZA VACCINE  08/07/2022   DTaP/Tdap/Td (2 - Td or Tdap) 10/28/2022   OPHTHALMOLOGY EXAM  01/09/2023   Diabetic kidney evaluation - eGFR measurement  02/05/2023   MAMMOGRAM  04/16/2023   Medicare Annual Wellness (AWV)  05/19/2023   COLONOSCOPY (Pts 45-24yrs Insurance coverage will need to be confirmed)  07/16/2031   Pneumonia Vaccine 48+ Years old  Completed   DEXA SCAN  Completed   Hepatitis C Screening  Completed   HPV VACCINES  Aged Out    Health Maintenance  Health Maintenance Due  Topic Date Due   Diabetic kidney evaluation - Urine ACR  05/14/2021   FOOT EXAM  05/14/2021    Colorectal cancer screening: Type of screening: Colonoscopy. Completed 07/12/21. Repeat every 10 years  Mammogram status: Completed 04/09/22. Repeat every year  Bone Density status: Completed 04/28/19. Results reflect: Bone density results: OSTEOPOROSIS. Repeat every 5 years.  Lung Cancer Screening: (Low Dose CT Chest recommended if Age 43-80 years, 30 pack-year currently smoking OR have quit w/in 15years.) does not qualify.     Additional Screening:  Hepatitis C Screening: does not qualify due to age  Vision Screening: Recommended annual  ophthalmology exams for early detection of glaucoma and other disorders of the eye. Is the patient up to date with their annual eye exam?  Yes  Who is the provider or what is the name of the office in which the patient attends annual eye exams? [facility] If pt is not established with a provider, would they like to be referred to a provider to establish care? No .  Dental Screening: Recommended annual dental exams for proper oral hygiene  Community Resource Referral / Chronic Care Management: CRR required this visit?  No   CCM required this visit?  No      Plan:     I have personally reviewed and noted the following in the patient's chart:   Medical and social history Use of alcohol, tobacco or illicit drugs  Current medications and supplements including opioid prescriptions. Patient is not currently taking opioid prescriptions. Functional ability and status Nutritional status Physical activity Advanced directives List of other physicians Hospitalizations, surgeries, and ER visits in previous 12 months Vitals Screenings to include cognitive, depression, and falls Referrals and appointments  In addition, I have reviewed and discussed with patient certain preventive protocols, quality metrics, and best practice recommendations. A written personalized care plan for preventive services as well as general preventive health recommendations were provided to patient.     Annabell Sabal, CMA   05/19/2022   Nurse Notes: none     I have reviewed the above information and agree with above.   Duncan Dull, MD

## 2022-05-19 NOTE — Patient Instructions (Signed)
[name] , Thank you for taking time to come for your Medicare Wellness Visit. I appreciate your ongoing commitment to your health goals. Please review the following plan we discussed and let me know if I can assist you in the future.   These are the goals we discussed:  Goals       Patient Stated     Maintain Healthy Lifestyle (pt-stated)      Eat healthy. Stay active; walk for exercise.         This is a list of the screening recommended for you and due dates:  Health Maintenance  Topic Date Due   Yearly kidney health urinalysis for diabetes  05/14/2021   Complete foot exam   05/14/2021   COVID-19 Vaccine (3 - Pfizer risk series) 06/04/2022*   Zoster (Shingles) Vaccine (1 of 2) 05/19/2023*   Hemoglobin A1C  08/05/2022   Flu Shot  08/07/2022   DTaP/Tdap/Td vaccine (2 - Td or Tdap) 10/28/2022   Eye exam for diabetics  01/09/2023   Yearly kidney function blood test for diabetes  02/05/2023   Mammogram  04/16/2023   Medicare Annual Wellness Visit  05/19/2023   Colon Cancer Screening  07/16/2031   Pneumonia Vaccine  Completed   DEXA scan (bone density measurement)  Completed   Hepatitis C Screening: USPSTF Recommendation to screen - Ages 39-79 yo.  Completed   HPV Vaccine  Aged Out  *Topic was postponed. The date shown is not the original due date.    Advanced directives: Please bring a copy of your health care power of attorney and living will to the office to be added to your chart at your convenience.   Conditions/risks identified: Aim for 30 minutes of exercise or brisk walking, 6-8 glasses of water, and 5 servings of fruits and vegetables each day.   Next appointment: Follow up in one year for your annual wellness visit    Preventive Care 65 Years and Older, Female Preventive care refers to lifestyle choices and visits with your health care provider that can promote health and wellness. What does preventive care include? A yearly physical exam. This is also called  an annual well check. Dental exams once or twice a year. Routine eye exams. Ask your health care provider how often you should have your eyes checked. Personal lifestyle choices, including: Daily care of your teeth and gums. Regular physical activity. Eating a healthy diet. Avoiding tobacco and drug use. Limiting alcohol use. Practicing safe sex. Taking low-dose aspirin every day. Taking vitamin and mineral supplements as recommended by your health care provider. What happens during an annual well check? The services and screenings done by your health care provider during your annual well check will depend on your age, overall health, lifestyle risk factors, and family history of disease. Counseling  Your health care provider may ask you questions about your: Alcohol use. Tobacco use. Drug use. Emotional well-being. Home and relationship well-being. Sexual activity. Eating habits. History of falls. Memory and ability to understand (cognition). Work and work Astronomer. Reproductive health. Screening  You may have the following tests or measurements: Height, weight, and BMI. Blood pressure. Lipid and cholesterol levels. These may be checked every 5 years, or more frequently if you are over 44 years old. Skin check. Lung cancer screening. You may have this screening every year starting at age 30 if you have a 30-pack-year history of smoking and currently smoke or have quit within the past 15 years. Fecal occult blood test (  FOBT) of the stool. You may have this test every year starting at age 82. Flexible sigmoidoscopy or colonoscopy. You may have a sigmoidoscopy every 5 years or a colonoscopy every 10 years starting at age 67. Hepatitis C blood test. Hepatitis B blood test. Sexually transmitted disease (STD) testing. Diabetes screening. This is done by checking your blood sugar (glucose) after you have not eaten for a while (fasting). You may have this done every 1-3  years. Bone density scan. This is done to screen for osteoporosis. You may have this done starting at age 66. Mammogram. This may be done every 1-2 years. Talk to your health care provider about how often you should have regular mammograms. Talk with your health care provider about your test results, treatment options, and if necessary, the need for more tests. Vaccines  Your health care provider may recommend certain vaccines, such as: Influenza vaccine. This is recommended every year. Tetanus, diphtheria, and acellular pertussis (Tdap, Td) vaccine. You may need a Td booster every 10 years. Zoster vaccine. You may need this after age 48. Pneumococcal 13-valent conjugate (PCV13) vaccine. One dose is recommended after age 21. Pneumococcal polysaccharide (PPSV23) vaccine. One dose is recommended after age 58. Talk to your health care provider about which screenings and vaccines you need and how often you need them. This information is not intended to replace advice given to you by your health care provider. Make sure you discuss any questions you have with your health care provider. Document Released: 01/19/2015 Document Revised: 09/12/2015 Document Reviewed: 10/24/2014 Elsevier Interactive Patient Education  2017 [place] Prevention in the Home Falls can cause injuries. They can happen to people of all ages. There are many things you can do to make your home safe and to help prevent falls. What can I do on the outside of my home? Regularly fix the edges of walkways and driveways and fix any cracks. Remove anything that might make you trip as you walk through a door, such as a raised step or threshold. Trim any bushes or trees on the path to your home. Use bright outdoor lighting. Clear any walking paths of anything that might make someone trip, such as rocks or tools. Regularly check to see if handrails are loose or broken. Make sure that both sides of any steps have  handrails. Any raised decks and porches should have guardrails on the edges. Have any leaves, snow, or ice cleared regularly. Use sand or salt on walking paths during winter. Clean up any spills in your garage right away. This includes oil or grease spills. What can I do in the bathroom? Use night lights. Install grab bars by the toilet and in the tub and shower. Do not use towel bars as grab bars. Use non-skid mats or decals in the tub or shower. If you need to sit down in the shower, use a plastic, non-slip stool. Keep the floor dry. Clean up any water that spills on the floor as soon as it happens. Remove soap buildup in the tub or shower regularly. Attach bath mats securely with double-sided non-slip rug tape. Do not have throw rugs and other things on the floor that can make you trip. What can I do in the bedroom? Use night lights. Make sure that you have a light by your bed that is easy to reach. Do not use any sheets or blankets that are too big for your bed. They should not hang down onto the floor. Have a  firm chair that has side arms. You can use this for support while you get dressed. Do not have throw rugs and other things on the floor that can make you trip. What can I do in the kitchen? Clean up any spills right away. Avoid walking on wet floors. Keep items that you use a lot in easy-to-reach places. If you need to reach something above you, use a strong step stool that has a grab bar. Keep electrical cords out of the way. Do not use floor polish or wax that makes floors slippery. If you must use wax, use non-skid floor wax. Do not have throw rugs and other things on the floor that can make you trip. What can I do with my stairs? Do not leave any items on the stairs. Make sure that there are handrails on both sides of the stairs and use them. Fix handrails that are broken or loose. Make sure that handrails are as long as the stairways. Check any carpeting to make sure  that it is firmly attached to the stairs. Fix any carpet that is loose or worn. Avoid having throw rugs at the top or bottom of the stairs. If you do have throw rugs, attach them to the floor with carpet tape. Make sure that you have a light switch at the top of the stairs and the bottom of the stairs. If you do not have them, ask someone to add them for you. What else can I do to help prevent falls? Wear shoes that: Do not have high heels. Have rubber bottoms. Are comfortable and fit you well. Are closed at the toe. Do not wear sandals. If you use a stepladder: Make sure that it is fully opened. Do not climb a closed stepladder. Make sure that both sides of the stepladder are locked into place. Ask someone to hold it for you, if possible. Clearly mark and make sure that you can see: Any grab bars or handrails. First and last steps. Where the edge of each step is. Use tools that help you move around (mobility aids) if they are needed. These include: Canes. Walkers. Scooters. Crutches. Turn on the lights when you go into a dark area. Replace any light bulbs as soon as they burn out. Set up your furniture so you have a clear path. Avoid moving your furniture around. If any of your floors are uneven, fix them. If there are any pets around you, be aware of where they are. Review your medicines with your doctor. Some medicines can make you feel dizzy. This can increase your chance of falling. Ask your doctor what other things that you can do to help prevent falls. This information is not intended to replace advice given to you by your health care provider. Make sure you discuss any questions you have with your health care provider. Document Released: 10/19/2008 Document Revised: 05/31/2015 Document Reviewed: 01/27/2014 Elsevier Interactive Patient Education  2017 [employer].

## 2022-05-29 DIAGNOSIS — H02831 Dermatochalasis of right upper eyelid: Secondary | ICD-10-CM | POA: Diagnosis not present

## 2022-05-29 DIAGNOSIS — H0289 Other specified disorders of eyelid: Secondary | ICD-10-CM | POA: Diagnosis not present

## 2022-06-10 ENCOUNTER — Other Ambulatory Visit (INDEPENDENT_AMBULATORY_CARE_PROVIDER_SITE_OTHER): Payer: PPO

## 2022-06-10 DIAGNOSIS — Z7901 Long term (current) use of anticoagulants: Secondary | ICD-10-CM

## 2022-06-10 LAB — PROTIME-INR
INR: 3.2 ratio — ABNORMAL HIGH (ref 0.8–1.0)
Prothrombin Time: 32.1 s — ABNORMAL HIGH (ref 9.6–13.1)

## 2022-06-12 NOTE — Assessment & Plan Note (Signed)
June 10 2022 INR is a little high.  Reduce  your 5 mg dose to 4 days per week and increase  your 4 mg dose to 3  days per week.  Repeat in one month  Lab Results  Component Value Date   INR 3.2 (H) 06/10/2022   INR 2.6 (H) 05/02/2022   INR 2.0 (H) 03/14/2022   PROTIME 57.9 (A) 02/17/2014

## 2022-06-16 ENCOUNTER — Other Ambulatory Visit: Payer: Self-pay | Admitting: Internal Medicine

## 2022-06-20 ENCOUNTER — Other Ambulatory Visit: Payer: Self-pay | Admitting: Internal Medicine

## 2022-06-20 DIAGNOSIS — Z1231 Encounter for screening mammogram for malignant neoplasm of breast: Secondary | ICD-10-CM

## 2022-06-24 ENCOUNTER — Ambulatory Visit
Admission: RE | Admit: 2022-06-24 | Discharge: 2022-06-24 | Disposition: A | Discharge status: Home / Self Care | Payer: PPO | Source: Ambulatory Visit | Attending: Internal Medicine | Admitting: Internal Medicine

## 2022-06-24 DIAGNOSIS — Z1231 Encounter for screening mammogram for malignant neoplasm of breast: Secondary | ICD-10-CM | POA: Diagnosis not present

## 2022-07-21 DIAGNOSIS — M13862 Other specified arthritis, left knee: Secondary | ICD-10-CM | POA: Diagnosis not present

## 2022-07-21 DIAGNOSIS — M25562 Pain in left knee: Secondary | ICD-10-CM | POA: Diagnosis not present

## 2022-08-05 ENCOUNTER — Other Ambulatory Visit (INDEPENDENT_AMBULATORY_CARE_PROVIDER_SITE_OTHER): Payer: PPO

## 2022-08-05 DIAGNOSIS — Z7901 Long term (current) use of anticoagulants: Secondary | ICD-10-CM

## 2022-08-06 ENCOUNTER — Encounter: Payer: Self-pay | Admitting: Internal Medicine

## 2022-08-11 ENCOUNTER — Encounter: Payer: Self-pay | Admitting: Internal Medicine

## 2022-08-13 DIAGNOSIS — L821 Other seborrheic keratosis: Secondary | ICD-10-CM | POA: Diagnosis not present

## 2022-08-13 DIAGNOSIS — L298 Other pruritus: Secondary | ICD-10-CM | POA: Diagnosis not present

## 2022-08-15 ENCOUNTER — Other Ambulatory Visit: Payer: Self-pay | Admitting: Internal Medicine

## 2022-08-15 DIAGNOSIS — F409 Phobic anxiety disorder, unspecified: Secondary | ICD-10-CM

## 2022-08-18 NOTE — Telephone Encounter (Signed)
Refilled: 01/28/2022 Last OV: 04/08/2022 Next OV: not scheduled

## 2022-08-25 ENCOUNTER — Other Ambulatory Visit (INDEPENDENT_AMBULATORY_CARE_PROVIDER_SITE_OTHER): Payer: PPO

## 2022-08-25 DIAGNOSIS — Z7901 Long term (current) use of anticoagulants: Secondary | ICD-10-CM | POA: Diagnosis not present

## 2022-08-25 NOTE — Addendum Note (Signed)
Addended by: Warden Fillers on: 08/25/2022 02:16 PM   Modules accepted: Orders

## 2022-08-26 ENCOUNTER — Other Ambulatory Visit: Payer: Self-pay | Admitting: Internal Medicine

## 2022-08-26 LAB — PROTIME-INR
INR: 2.2 — ABNORMAL HIGH
Prothrombin Time: 21.9 s — ABNORMAL HIGH (ref 9.0–11.5)

## 2022-08-29 ENCOUNTER — Other Ambulatory Visit: Payer: Self-pay | Admitting: Internal Medicine

## 2022-09-01 DIAGNOSIS — M25562 Pain in left knee: Secondary | ICD-10-CM | POA: Diagnosis not present

## 2022-09-01 DIAGNOSIS — M1712 Unilateral primary osteoarthritis, left knee: Secondary | ICD-10-CM | POA: Diagnosis not present

## 2022-09-12 ENCOUNTER — Other Ambulatory Visit: Payer: Self-pay | Admitting: Internal Medicine

## 2022-10-16 ENCOUNTER — Other Ambulatory Visit: Payer: Self-pay | Admitting: *Deleted

## 2022-10-16 DIAGNOSIS — Z7901 Long term (current) use of anticoagulants: Secondary | ICD-10-CM

## 2022-10-17 ENCOUNTER — Other Ambulatory Visit (INDEPENDENT_AMBULATORY_CARE_PROVIDER_SITE_OTHER): Payer: PPO

## 2022-10-17 DIAGNOSIS — Z7901 Long term (current) use of anticoagulants: Secondary | ICD-10-CM

## 2022-10-17 LAB — PROTIME-INR
INR: 2.5 {ratio} — ABNORMAL HIGH (ref 0.8–1.0)
Prothrombin Time: 25.8 s — ABNORMAL HIGH (ref 9.6–13.1)

## 2022-10-24 ENCOUNTER — Other Ambulatory Visit: Payer: Self-pay | Admitting: Internal Medicine

## 2022-11-03 ENCOUNTER — Telehealth: Payer: Self-pay | Admitting: Internal Medicine

## 2022-11-03 ENCOUNTER — Ambulatory Visit (INDEPENDENT_AMBULATORY_CARE_PROVIDER_SITE_OTHER): Payer: PPO | Admitting: Internal Medicine

## 2022-11-03 ENCOUNTER — Encounter: Payer: Self-pay | Admitting: Internal Medicine

## 2022-11-03 VITALS — BP 110/60 | HR 83 | Ht 61.0 in | Wt 157.2 lb

## 2022-11-03 DIAGNOSIS — E785 Hyperlipidemia, unspecified: Secondary | ICD-10-CM | POA: Diagnosis not present

## 2022-11-03 DIAGNOSIS — R001 Bradycardia, unspecified: Secondary | ICD-10-CM | POA: Diagnosis not present

## 2022-11-03 DIAGNOSIS — I455 Other specified heart block: Secondary | ICD-10-CM | POA: Diagnosis not present

## 2022-11-03 DIAGNOSIS — Z7901 Long term (current) use of anticoagulants: Secondary | ICD-10-CM

## 2022-11-03 DIAGNOSIS — Z23 Encounter for immunization: Secondary | ICD-10-CM | POA: Diagnosis not present

## 2022-11-03 DIAGNOSIS — R809 Proteinuria, unspecified: Secondary | ICD-10-CM | POA: Diagnosis not present

## 2022-11-03 DIAGNOSIS — E1129 Type 2 diabetes mellitus with other diabetic kidney complication: Secondary | ICD-10-CM | POA: Diagnosis not present

## 2022-11-03 DIAGNOSIS — E1169 Type 2 diabetes mellitus with other specified complication: Secondary | ICD-10-CM

## 2022-11-03 DIAGNOSIS — K638219 Small intestinal bacterial overgrowth, unspecified: Secondary | ICD-10-CM

## 2022-11-03 DIAGNOSIS — E663 Overweight: Secondary | ICD-10-CM

## 2022-11-03 DIAGNOSIS — I10 Essential (primary) hypertension: Secondary | ICD-10-CM

## 2022-11-03 DIAGNOSIS — R1010 Upper abdominal pain, unspecified: Secondary | ICD-10-CM

## 2022-11-03 LAB — MICROALBUMIN / CREATININE URINE RATIO
Creatinine,U: 164.4 mg/dL
Microalb Creat Ratio: 0.5 mg/g (ref 0.0–30.0)
Microalb, Ur: 0.8 mg/dL (ref 0.0–1.9)

## 2022-11-03 MED ORDER — BUPROPION HCL ER (XL) 150 MG PO TB24: 150.0000 mg | ORAL_TABLET | Freq: Every day | ORAL | 1 refills | Status: DC

## 2022-11-03 MED ORDER — RIFAXIMIN 550 MG PO TABS: 550.0000 mg | ORAL_TABLET | Freq: Three times a day (TID) | ORAL | 0 refills | Status: DC

## 2022-11-03 MED ORDER — TRAZODONE HCL 50 MG PO TABS: 50.0000 mg | ORAL_TABLET | Freq: Every day | ORAL | 1 refills | Status: DC

## 2022-11-03 MED ORDER — AMLODIPINE BESYLATE 5 MG PO TABS: 5.0000 mg | ORAL_TABLET | Freq: Every day | ORAL | 1 refills | Status: DC

## 2022-11-03 MED ORDER — METOPROLOL SUCCINATE ER 100 MG PO TB24: ORAL_TABLET | ORAL | 1 refills | Status: DC

## 2022-11-03 MED ORDER — ARIPIPRAZOLE 2 MG PO TABS: 2.0000 mg | ORAL_TABLET | Freq: Every day | ORAL | 1 refills | Status: DC

## 2022-11-03 MED ORDER — METOPROLOL SUCCINATE ER 100 MG PO TB24: 50.0000 mg | ORAL_TABLET | Freq: Every day | ORAL | 1 refills | Status: DC

## 2022-11-03 MED ORDER — ONDANSETRON HCL 4 MG PO TABS: 4.0000 mg | ORAL_TABLET | Freq: Three times a day (TID) | ORAL | 0 refills | Status: DC | PRN

## 2022-11-03 MED ORDER — OMEPRAZOLE 20 MG PO CPDR: 20.0000 mg | DELAYED_RELEASE_CAPSULE | Freq: Every day | ORAL | Status: AC

## 2022-11-03 NOTE — Assessment & Plan Note (Signed)
SUGGESTED BY HISTORY   I have ordered and reviewed a 12 lead EKG and find that there are no acute changes and patient is in sinus rhythm.    She has an appt with her cardiologist on Friday

## 2022-11-03 NOTE — Assessment & Plan Note (Signed)
I have ordered and reviewed a 12 lead EKG and find that there are no acute changes and patient is in sinus bradycarida.  Redcing metoprolol dose to 50 mg daily thm.

## 2022-11-03 NOTE — Progress Notes (Signed)
Subjective:  Patient ID: [name], female    DOB: Aug 12, 1950  Age: 72 y.o. MRN: [id number]  CC: The primary encounter diagnosis was Controlled type 2 diabetes mellitus with microalbuminuria, without long-term current use of insulin (HCC). Diagnoses of Hypertension, unspecified type, Hyperlipidemia associated with type 2 diabetes mellitus (HCC), Overweight (BMI 25.0-29.9), Bradycardia, Anticoagulation monitoring, INR range 2-3, Sinus pause, Need for influenza vaccination, Pain of upper abdomen, and Sinus bradycardia were also pertinent to this visit.   HPI [name] presents for  Chief Complaint  Patient presents with   Abdominal Pain    72 YR OLD FEMALE WITH H/O SIBO  in Sept 2023 presents with recurrence of GI symptoms (belching,  distension and pain)  that have been constant since June/July.  She was treated last year  with oral Septra  for 2 weeks because Rifaxamin not covered by insurance)  which resulted in temporary change in anticoagulation to Eliquis as a substitute for coumadin .  Stomach hurts with and without eating   Constipation/IBS;  alternates between constipation  and diarrhea   Did not tolerate linzess ,  due to cramping and fecal incontinence .  Moving bowels every 2 to 3 days.     Complicated grief  symptoms improving  3) has an appt with cardiology in early November b.c "My heart has been stopping "  occurring during quiet activities.  Feels presyncopal during episodes   Lab Results  Component Value Date   INR 2.5 (H) 10/17/2022   INR 2.2 (H) 08/25/2022   INR 1.7 (H) 08/05/2022   PROTIME 57.9 (A) 02/17/2014          Outpatient Medications Prior to Visit  Medication Sig Dispense Refill   ALPRAZolam (XANAX) 0.25 MG tablet TAKE 1 TABLET BY MOUTH AT BEDTIME AS NEEDED FOR ANXIETY 30 tablet 2   aspirin 81 MG tablet Take 81 mg by mouth daily.     atorvastatin (LIPITOR) 40 MG tablet Take 1 tablet (40 mg total) by mouth daily. 90 tablet 3    butalbital-acetaminophen-caffeine (FIORICET) 50-325-40 MG tablet TAKE 1 TABLET BY MOUTH EVERY 12 HOURS AS NEEDED FOR HEADACHE 60 tablet 05   Cholecalciferol (VITAMIN D3) 1000 UNITS CAPS Take by mouth.     cyanocobalamin (VITAMIN B12) 1000 MCG/ML injection INJECT INTO THE MUSCLE EVERY 30 DAYS 10 mL 0   furosemide (LASIX) 20 MG tablet Take 1 tablet by mouth once daily (Patient taking differently: Take 20 mg by mouth 2 (two) times a week. Tuesday and Friday) 90 tablet 0   glucose blood test strip Use to check blood sugar once daily. 100 each 5   hydrochlorothiazide (HYDRODIURIL) 25 MG tablet Take 1 tablet (25 mg total) by mouth daily. 90 tablet 3   Omega-3 Fatty Acids (FISH OIL) 1000 MG CAPS Take 1 capsule daily by mouth.     OneTouch Delica Lancets 33G MISC Use to check blood sugar once daily. 100 each 5   Syringe/Needle, Disp, (SYRINGE 3CC/25GX1") 25G X 1" 3 ML MISC Use for b12 injections 50 each 0   telmisartan (MICARDIS) 80 MG tablet Take 1 tablet by mouth once daily 90 tablet 1   triamcinolone cream (KENALOG) 0.1 % Apply 1 application topically 2 (two) times daily. 30 g 0   warfarin (COUMADIN) 4 MG tablet TAKE 1 TABLET BY MOUTH ONCE DAILY ON  MONDAY  AND  THURSDAY  OTHER  DAYS  TAKE  5MG  30 tablet 0   warfarin (COUMADIN) 5 MG  tablet Take 1 tablet by mouth once daily 30 tablet 0   amLODipine (NORVASC) 5 MG tablet Take 1 tablet (5 mg total) by mouth daily. 90 tablet 0   ARIPiprazole (ABILIFY) 2 MG tablet Take 1 tablet (2 mg total) by mouth daily. 30 tablet 2   ARIPiprazole (ABILIFY) 2 MG tablet Take 1 tablet (2 mg total) by mouth daily. 90 tablet 1   buPROPion (WELLBUTRIN XL) 150 MG 24 hr tablet Take 1 tablet by mouth once daily 90 tablet 0   metoprolol succinate (TOPROL-XL) 100 MG 24 hr tablet TAKE 1 TABLET BY MOUTH ONCE DAILY AT BEDTIME WITH OR IMMEDIATELY FOLLOWING A MEAL 90 tablet 0   traZODone (DESYREL) 50 MG tablet TAKE 1 TABLET BY MOUTH AT BEDTIME 90 tablet 0   Facility-Administered  Medications Prior to Visit  Medication Dose Route Frequency Provider Last Rate Last Admin   cyanocobalamin ((VITAMIN B-12)) injection 1,000 mcg  1,000 mcg Intramuscular Once Sherlene Shams, MD        Review of Systems;  Patient denies headache, fevers, malaise, unintentional weight loss, skin rash, eye pain, sinus congestion and sinus pain, sore throat, dysphagia,  hemoptysis , cough, dyspnea, wheezing, chest pain, palpitations, orthopnea, edema, abdominal pain, nausea, melena, diarrhea, constipation, flank pain, dysuria, hematuria, urinary  Frequency, nocturia, numbness, tingling, seizures,  Focal weakness, Loss of consciousness,  Tremor, insomnia, depression, anxiety, and suicidal ideation.      Objective:  BP 110/60   Pulse 83   Ht 5\' 1"  (1.549 m)   Wt 157 lb 3.2 oz (71.3 kg)   SpO2 96%   BMI 29.70 kg/m   BP Readings from Last 3 Encounters:  11/03/22 110/60  04/08/22 120/68  01/28/22 (!) 156/88    Wt Readings from Last 3 Encounters:  11/03/22 157 lb 3.2 oz (71.3 kg)  05/19/22 156 lb (70.8 kg)  04/08/22 156 lb 12.8 oz (71.1 kg)    Physical Exam Vitals reviewed.  Constitutional:      General: She is not in acute distress.    Appearance: Normal appearance. She is normal weight. She is not ill-appearing, toxic-appearing or diaphoretic.  HENT:     Head: Normocephalic.  Eyes:     General: No scleral icterus.       Right eye: No discharge.        Left eye: No discharge.     Conjunctiva/sclera: Conjunctivae normal.  Cardiovascular:     Rate and Rhythm: Normal rate and regular rhythm.     Heart sounds: Normal heart sounds.  Pulmonary:     Effort: Pulmonary effort is normal. No respiratory distress.     Breath sounds: Normal breath sounds.  Abdominal:     General: Bowel sounds are normal. There is distension.     Palpations: Abdomen is soft.     Tenderness: There is generalized abdominal tenderness.  Musculoskeletal:        General: Normal range of motion.  Skin:     General: Skin is warm and dry.  Neurological:     General: No focal deficit present.     Mental Status: She is alert and oriented to person, place, and time. Mental status is at baseline.  Psychiatric:        Mood and Affect: Mood normal.        Behavior: Behavior normal.        Thought Content: Thought content normal.        Judgment: Judgment normal.    Lab Results  Component Value Date   HGBA1C 6.1 02/04/2022   HGBA1C 6.1 10/28/2021   HGBA1C 5.9 (H) 02/15/2021    Lab Results  Component Value Date   CREATININE 0.99 02/04/2022   CREATININE 0.83 10/28/2021   CREATININE 0.90 08/06/2021    Lab Results  Component Value Date   WBC 5.8 10/28/2021   HGB 13.3 10/28/2021   HCT 40.6 10/28/2021   PLT 180.0 10/28/2021   GLUCOSE 107 (H) 02/04/2022   CHOL 137 02/04/2022   TRIG 120.0 02/04/2022   HDL 49.90 02/04/2022   LDLDIRECT 111.0 08/13/2016   LDLCALC 63 02/04/2022   ALT 44 (H) 02/04/2022   AST 23 02/04/2022   NA 142 02/04/2022   K 3.7 02/04/2022   CL 105 02/04/2022   CREATININE 0.99 02/04/2022   BUN 22 02/04/2022   CO2 29 02/04/2022   TSH 2.023 10/23/2021   INR 2.5 (H) 10/17/2022   HGBA1C 6.1 02/04/2022   MICROALBUR 0.8 11/03/2022    MM 3D SCREENING MAMMOGRAM BILATERAL BREAST  Result Date: 06/25/2022 CLINICAL DATA:  Screening. EXAM: DIGITAL SCREENING BILATERAL MAMMOGRAM WITH TOMOSYNTHESIS AND CAD TECHNIQUE: Bilateral screening digital craniocaudal and mediolateral oblique mammograms were obtained. Bilateral screening digital breast tomosynthesis was performed. The images were evaluated with computer-aided detection. COMPARISON:  Previous exam(s). ACR Breast Density Category b: There are scattered areas of fibroglandular density. FINDINGS: There are no findings suspicious for malignancy. IMPRESSION: No mammographic evidence of malignancy. A result letter of this screening mammogram will be mailed directly to the patient. RECOMMENDATION: Screening mammogram in one year.  (Code:SM-B-01Y) BI-RADS CATEGORY  1: Negative. Electronically Signed   By: Hulan Saas M.D.   On: 06/25/2022 16:56    Assessment & Plan:  .Controlled type 2 diabetes mellitus with microalbuminuria, without long-term current use of insulin (HCC) -     Hemoglobin A1c -     Comprehensive metabolic panel -     Microalbumin / creatinine urine ratio  Hypertension, unspecified type -     amLODIPine Besylate; Take 1 tablet (5 mg total) by mouth daily.  Dispense: 90 tablet; Refill: 1 -     Comprehensive metabolic panel -     Microalbumin / creatinine urine ratio  Hyperlipidemia associated with type 2 diabetes mellitus (HCC) -     Lipid panel -     LDL cholesterol, direct  Overweight (BMI 25.0-29.9) -     CBC with Differential/Platelet -     TSH  Bradycardia -     EKG 12-Lead  Anticoagulation monitoring, INR range 2-3 Assessment & Plan: She will require frequent monitoring during use of abx for treatment of SIBO.  INR is pending   Lab Results  Component Value Date   INR 2.5 (H) 10/17/2022   INR 2.2 (H) 08/25/2022   INR 1.7 (H) 08/05/2022   PROTIME 57.9 (A) 02/17/2014     Orders: -     Protime-INR  Sinus pause Assessment & Plan: SUGGESTED BY HISTORY   I have ordered and reviewed a 12 lead EKG and find that there are no acute changes and patient is in sinus rhythm.    She has an appt with her cardiologist on Friday    Need for influenza vaccination -     Flu Vaccine Trivalent High Dose (Fluad)  Pain of upper abdomen Assessment & Plan: Symptoms suggestive of recurrent SIBO with bloating distension and pain .   Rx Rifaxaimin     Sinus bradycardia Assessment & Plan: I have ordered and reviewed a 12  lead EKG and find that there are no acute changes and patient is in sinus bradycarida.  Redcing metoprolol dose to 50 mg daily thm.      Other orders -     ARIPiprazole; Take 1 tablet (2 mg total) by mouth daily.  Dispense: 90 tablet; Refill: 1 -     buPROPion HCl ER  (XL); Take 1 tablet (150 mg total) by mouth daily.  Dispense: 90 tablet; Refill: 1 -     traZODone HCl; Take 1 tablet (50 mg total) by mouth at bedtime.  Dispense: 90 tablet; Refill: 1 -     Ondansetron HCl; Take 1 tablet (4 mg total) by mouth every 8 (eight) hours as needed for nausea or vomiting.  Dispense: 20 tablet; Refill: 0 -     Omeprazole; Take 1 capsule (20 mg total) by mouth daily. -     rifAXIMin; Take 1 tablet (550 mg total) by mouth 3 (three) times daily.  Dispense: 42 tablet; Refill: 0 -     Metoprolol Succinate ER; Take 0.5 tablets (50 mg total) by mouth daily. Take with or immediately following a meal.  Dispense: 90 tablet; Refill: 1     In addition to reviewing an EKG, I . provided 42  minutes of face-to-face time during this encounter reviewing patient's last visit with me, patient's  most recent visit with   gastroenterology ,  recent surgical and non surgical procedures, previous  labs and imaging studies, counseling on currently addressed issues,  and post visit ordering to diagnostics and therapeutics .   Follow-up: No follow-ups on file.   Sherlene Shams, MD

## 2022-11-03 NOTE — Assessment & Plan Note (Addendum)
She will require frequent monitoring during use of abx for treatment of SIBO.  INR is pending   Lab Results  Component Value Date   INR 2.5 (H) 10/17/2022   INR 2.2 (H) 08/25/2022   INR 1.7 (H) 08/05/2022   PROTIME 57.9 (A) 02/17/2014

## 2022-11-03 NOTE — Telephone Encounter (Signed)
Patient was seen today by Dr Darrick Huntsman. The medication Rifaxamin is not cover by insurance, she needs something else called in ,please.

## 2022-11-03 NOTE — Patient Instructions (Signed)
I have prescribed rifaxamin for your SIBO.  IF YOUR INSURANCE DOES NOT COVER IT,  LET ME KNOW ASAP  WE WILL NEED TO MONITOR YOUR COUMADIN LEVEL FREQUENTLY REGARDLESS OF WHETHER YOU TAKE RIFAXAMIN OR SEPTRA

## 2022-11-03 NOTE — Assessment & Plan Note (Signed)
Symptoms suggestive of recurrent SIBO with bloating distension and pain .   Rx Rifaxaimin

## 2022-11-04 ENCOUNTER — Other Ambulatory Visit: Payer: Self-pay | Admitting: Internal Medicine

## 2022-11-04 DIAGNOSIS — K638219 Small intestinal bacterial overgrowth, unspecified: Secondary | ICD-10-CM

## 2022-11-04 MED ORDER — SULFAMETHOXAZOLE-TRIMETHOPRIM 800-160 MG PO TABS
1.0000 | ORAL_TABLET | Freq: Two times a day (BID) | ORAL | 0 refills | Status: DC
Start: 2022-11-04 — End: 2022-11-05

## 2022-11-04 NOTE — Addendum Note (Signed)
Addended by: Sherlene Shams on: 11/04/2022 03:44 PM   Modules accepted: Orders

## 2022-11-04 NOTE — Telephone Encounter (Signed)
LMTCB but also sent a mychart message.

## 2022-11-04 NOTE — Addendum Note (Signed)
Addended by: Sherlene Shams on: 11/04/2022 03:42 PM   Modules accepted: Orders

## 2022-11-04 NOTE — Telephone Encounter (Signed)
WE WILL USE SEPTRA DS  TO TREAT HER BOWEL INFECTION  LIKE WE DID LAST YEAR.  THIS ANTIBIOTIC  WILL MAKE HER INR GO UP,  SO I WOULD LIKE HER TO REDUCE HER DOSE OF WARFARIN TO 3 MG DAILY ONCE HSE STARTS THE SEPTRA . AND HAVE AN INR  CHECKED ON FRIDAY   DOES SHE HAVE ANY 3 MG TABLETS?  Lab Results  Component Value Date   INR 2.5 (H) 10/17/2022   INR 2.2 (H) 08/25/2022   INR 1.7 (H) 08/05/2022   PROTIME 57.9 (A) 02/17/2014

## 2022-11-05 ENCOUNTER — Other Ambulatory Visit: Payer: Self-pay | Admitting: Internal Medicine

## 2022-11-05 DIAGNOSIS — K638219 Small intestinal bacterial overgrowth, unspecified: Secondary | ICD-10-CM

## 2022-11-07 ENCOUNTER — Ambulatory Visit: Payer: PPO | Attending: Cardiology | Admitting: Cardiology

## 2022-11-07 ENCOUNTER — Ambulatory Visit: Payer: PPO

## 2022-11-07 ENCOUNTER — Encounter: Payer: Self-pay | Admitting: Cardiology

## 2022-11-07 VITALS — BP 110/68 | HR 56 | Ht 61.0 in | Wt 158.6 lb

## 2022-11-07 DIAGNOSIS — I10 Essential (primary) hypertension: Secondary | ICD-10-CM

## 2022-11-07 DIAGNOSIS — R009 Unspecified abnormalities of heart beat: Secondary | ICD-10-CM

## 2022-11-07 DIAGNOSIS — E7800 Pure hypercholesterolemia, unspecified: Secondary | ICD-10-CM

## 2022-11-07 NOTE — Patient Instructions (Signed)
Medication Instructions:   Stop metoprolol Succinate  *If you need a refill on your cardiac medications before your next appointment, please call your pharmacy*   Lab Work:  None Ordered  If you have labs (blood work) drawn today and your tests are completely normal, you will receive your results only by: MyChart Message (if you have MyChart) OR A paper copy in the mail If you have any lab test that is abnormal or we need to change your treatment, we will call you to review the results.   Testing/Procedures:  Your physician has recommended that you wear a Zio monitor for 7 days.   This monitor is a medical device that records the heart's electrical activity. Doctors most often use these monitors to diagnose arrhythmias. Arrhythmias are problems with the speed or rhythm of the heartbeat. The monitor is a small device applied to your chest. You can wear one while you do your normal daily activities. While wearing this monitor if you have any symptoms to push the button and record what you felt. Once you have worn this monitor for the period of time provider prescribed (Usually 14 days), you will return the monitor device in the postage paid box. Once it is returned they will download the data collected and provide [place] with a report which the provider will then review and we will call you with those results. Important tips:  Avoid showering during the first 24 hours of wearing the monitor. Avoid excessive sweating to help maximize wear time. Do not submerge the device, no hot tubs, and no swimming pools. Keep any lotions or oils away from the patch. After 24 hours you may shower with the patch on. Take brief showers with your back facing the shower head.  Do not remove patch once it has been placed because that will interrupt data and decrease adhesive wear time. Push the button when you have any symptoms and write down what you were feeling. Once you have completed wearing your monitor,  remove and place into box which has postage paid and place in your outgoing mailbox.  If for some reason you have misplaced your box then call our office and we can provide another box and/or mail it off for you.   Follow-Up: At [facility], you and your health needs are our priority.  As part of our continuing mission to provide you with exceptional heart care, we have created designated Provider Care Teams.  These Care Teams include your primary Cardiologist (physician) and Advanced Practice Providers (APPs -  Physician Assistants and Nurse Practitioners) who all work together to provide you with the care you need, when you need it.  We recommend signing up for the patient portal called "MyChart".  Sign up information is provided on this After Visit Summary.  MyChart is used to connect with patients for Virtual Visits (Telemedicine).  Patients are able to view lab/test results, encounter notes, upcoming appointments, etc.  Non-urgent messages can be sent to your provider as well.   To learn more about what you can do with MyChart, go to [email or web address].    Your next appointment:    After Zio   Provider:   You may see Debbe Odea, MD or one of the following Advanced Practice Providers on your designated Care Team:   Nicolasa Ducking, NP Eula Listen, PA-C Cadence Fransico Michael, PA-C Charlsie Quest, NP Carlos Levering, NP

## 2022-11-07 NOTE — Telephone Encounter (Signed)
Dr. Darrick Huntsman sent a note to the pharmacy stating that she was aware and has already addressed the warfarin dose with pt.

## 2022-11-07 NOTE — Addendum Note (Signed)
Addended by: Jarvis Morgan D on: 11/07/2022 07:26 AM   Modules accepted: Orders
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
## 2022-11-10 ENCOUNTER — Other Ambulatory Visit (INDEPENDENT_AMBULATORY_CARE_PROVIDER_SITE_OTHER): Payer: PPO

## 2022-11-10 DIAGNOSIS — R009 Unspecified abnormalities of heart beat: Secondary | ICD-10-CM

## 2022-11-10 DIAGNOSIS — K638219 Small intestinal bacterial overgrowth, unspecified: Secondary | ICD-10-CM

## 2022-11-11 ENCOUNTER — Telehealth: Payer: Self-pay

## 2022-11-11 DIAGNOSIS — Z7901 Long term (current) use of anticoagulants: Secondary | ICD-10-CM

## 2022-11-11 LAB — PROTIME-INR
INR: 4 — ABNORMAL HIGH
Prothrombin Time: 38.3 s — ABNORMAL HIGH (ref 9.0–11.5)

## 2022-11-11 NOTE — Telephone Encounter (Signed)
Lab ordered for lab appt on Monday 11/17/2022.

## 2022-11-17 ENCOUNTER — Other Ambulatory Visit (INDEPENDENT_AMBULATORY_CARE_PROVIDER_SITE_OTHER): Payer: PPO

## 2022-11-17 DIAGNOSIS — Z7901 Long term (current) use of anticoagulants: Secondary | ICD-10-CM

## 2022-11-17 NOTE — Addendum Note (Signed)
Addended by: Jarvis Morgan D on: 11/17/2022 02:27 PM   Modules accepted: Orders

## 2022-11-18 ENCOUNTER — Other Ambulatory Visit: Payer: Self-pay

## 2022-11-18 ENCOUNTER — Emergency Department: Payer: PPO

## 2022-11-18 ENCOUNTER — Emergency Department
Admission: EM | Admit: 2022-11-18 | Discharge: 2022-11-19 | Disposition: A | Discharge status: Home / Self Care | Payer: PPO | Attending: Emergency Medicine | Admitting: Emergency Medicine

## 2022-11-18 ENCOUNTER — Telehealth: Payer: Self-pay | Admitting: Internal Medicine

## 2022-11-18 DIAGNOSIS — M791 Myalgia, unspecified site: Secondary | ICD-10-CM | POA: Diagnosis not present

## 2022-11-18 DIAGNOSIS — R112 Nausea with vomiting, unspecified: Secondary | ICD-10-CM | POA: Diagnosis not present

## 2022-11-18 DIAGNOSIS — E785 Hyperlipidemia, unspecified: Secondary | ICD-10-CM | POA: Diagnosis not present

## 2022-11-18 DIAGNOSIS — D6859 Other primary thrombophilia: Secondary | ICD-10-CM | POA: Insufficient documentation

## 2022-11-18 DIAGNOSIS — R519 Headache, unspecified: Secondary | ICD-10-CM | POA: Insufficient documentation

## 2022-11-18 DIAGNOSIS — I10 Essential (primary) hypertension: Secondary | ICD-10-CM | POA: Diagnosis not present

## 2022-11-18 DIAGNOSIS — R1084 Generalized abdominal pain: Secondary | ICD-10-CM | POA: Diagnosis not present

## 2022-11-18 DIAGNOSIS — E119 Type 2 diabetes mellitus without complications: Secondary | ICD-10-CM | POA: Insufficient documentation

## 2022-11-18 DIAGNOSIS — R069 Unspecified abnormalities of breathing: Secondary | ICD-10-CM | POA: Diagnosis not present

## 2022-11-18 DIAGNOSIS — Z7901 Long term (current) use of anticoagulants: Secondary | ICD-10-CM | POA: Diagnosis not present

## 2022-11-18 DIAGNOSIS — R531 Weakness: Secondary | ICD-10-CM | POA: Diagnosis not present

## 2022-11-18 DIAGNOSIS — Z9049 Acquired absence of other specified parts of digestive tract: Secondary | ICD-10-CM | POA: Diagnosis not present

## 2022-11-18 DIAGNOSIS — K559 Vascular disorder of intestine, unspecified: Secondary | ICD-10-CM | POA: Diagnosis not present

## 2022-11-18 DIAGNOSIS — R1033 Periumbilical pain: Secondary | ICD-10-CM | POA: Diagnosis not present

## 2022-11-18 DIAGNOSIS — R932 Abnormal findings on diagnostic imaging of liver and biliary tract: Secondary | ICD-10-CM | POA: Diagnosis not present

## 2022-11-18 DIAGNOSIS — Z1152 Encounter for screening for COVID-19: Secondary | ICD-10-CM | POA: Insufficient documentation

## 2022-11-18 LAB — PROTIME-INR
INR: 1.1
INR: 1.3 — ABNORMAL HIGH (ref 0.8–1.2)
Prothrombin Time: 11.9 s — ABNORMAL HIGH (ref 9.0–11.5)
Prothrombin Time: 16.7 s — ABNORMAL HIGH (ref 11.4–15.2)

## 2022-11-18 LAB — CBC
HCT: 43.2 % (ref 36.0–46.0)
Hemoglobin: 14.5 g/dL (ref 12.0–15.0)
MCH: 29 pg (ref 26.0–34.0)
MCHC: 33.6 g/dL (ref 30.0–36.0)
MCV: 86.4 fL (ref 80.0–100.0)
Platelets: 175 10*3/uL (ref 150–400)
RBC: 5 MIL/uL (ref 3.87–5.11)
RDW: 13.4 % (ref 11.5–15.5)
WBC: 3.4 10*3/uL — ABNORMAL LOW (ref 4.0–10.5)
nRBC: 0 % (ref 0.0–0.2)

## 2022-11-18 LAB — RESP PANEL BY RT-PCR (RSV, FLU A&B, COVID)  RVPGX2
Influenza A by PCR: NEGATIVE
Influenza B by PCR: NEGATIVE
Resp Syncytial Virus by PCR: NEGATIVE
SARS Coronavirus 2 by RT PCR: NEGATIVE

## 2022-11-18 LAB — URINALYSIS, ROUTINE W REFLEX MICROSCOPIC
Bilirubin Urine: NEGATIVE
Glucose, UA: NEGATIVE mg/dL
Hgb urine dipstick: NEGATIVE
Ketones, ur: NEGATIVE mg/dL
Leukocytes,Ua: NEGATIVE
Nitrite: NEGATIVE
Protein, ur: NEGATIVE mg/dL
Specific Gravity, Urine: 1.034 — ABNORMAL HIGH (ref 1.005–1.030)
pH: 7 (ref 5.0–8.0)

## 2022-11-18 LAB — COMPREHENSIVE METABOLIC PANEL
ALT: 28 U/L (ref 0–44)
AST: 35 U/L (ref 15–41)
Albumin: 4.3 g/dL (ref 3.5–5.0)
Alkaline Phosphatase: 73 U/L (ref 38–126)
Anion gap: 13 (ref 5–15)
BUN: 18 mg/dL (ref 8–23)
CO2: 20 mmol/L — ABNORMAL LOW (ref 22–32)
Calcium: 8.6 mg/dL — ABNORMAL LOW (ref 8.9–10.3)
Chloride: 99 mmol/L (ref 98–111)
Creatinine, Ser: 1.55 mg/dL — ABNORMAL HIGH (ref 0.44–1.00)
GFR, Estimated: 35 mL/min — ABNORMAL LOW (ref >60)
Glucose, Bld: 110 mg/dL — ABNORMAL HIGH (ref 70–99)
Potassium: 4.8 mmol/L (ref 3.5–5.1)
Sodium: 132 mmol/L — ABNORMAL LOW (ref 135–145)
Total Bilirubin: 0.7 mg/dL (ref <1.2)
Total Protein: 7 g/dL (ref 6.5–8.1)

## 2022-11-18 LAB — APTT: aPTT: 25 s (ref 24–36)

## 2022-11-18 LAB — LACTIC ACID, PLASMA: Lactic Acid, Venous: 2.1 mmol/L (ref 0.5–1.9)

## 2022-11-18 LAB — LIPASE, BLOOD: Lipase: 29 U/L (ref 11–51)

## 2022-11-18 MED ORDER — ONDANSETRON 4 MG PO TBDP: 4.0000 mg | ORAL_TABLET | Freq: Four times a day (QID) | ORAL | 0 refills | Status: DC | PRN

## 2022-11-18 MED ORDER — IOHEXOL 300 MG/ML  SOLN
75.0000 mL | Freq: Once | INTRAMUSCULAR | Status: AC | PRN
Start: 2022-11-18 — End: 2022-11-18
  Administered 2022-11-18: 75 mL via INTRAVENOUS

## 2022-11-18 MED ORDER — ONDANSETRON HCL 4 MG/2ML IJ SOLN
4.0000 mg | Freq: Once | INTRAMUSCULAR | Status: AC
Start: 2022-11-18 — End: 2022-11-18
  Administered 2022-11-18: 4 mg via INTRAVENOUS
  Filled 2022-11-18: qty 2

## 2022-11-18 MED ORDER — HYDROCODONE-ACETAMINOPHEN 5-325 MG PO TABS: 1.0000 | ORAL_TABLET | Freq: Four times a day (QID) | ORAL | 0 refills | Status: DC | PRN

## 2022-11-18 MED ORDER — MORPHINE SULFATE (PF) 4 MG/ML IV SOLN
4.0000 mg | Freq: Once | INTRAVENOUS | Status: AC
  Administered 2022-11-18: 4 mg via INTRAVENOUS
  Filled 2022-11-18: qty 1

## 2022-11-18 MED ORDER — LACTATED RINGERS IV BOLUS
1000.0000 mL | Freq: Once | INTRAVENOUS | Status: AC
  Administered 2022-11-18: 1000 mL via INTRAVENOUS

## 2022-11-18 NOTE — ED Provider Notes (Signed)
11:54 PM  Assumed care at shift change.

## 2022-11-18 NOTE — ED Triage Notes (Signed)
Pt comes via [facility] from home with flu like symptoms. Pt was dx with SIBO two weeks ago. Pt states she has been on meds for the SIBO. Pt states she doesn't know if the symptoms are related or something else.  Pt states no fevers, lungs clear and pt does state some pain when taking in deep breath.

## 2022-11-18 NOTE — ED Provider Notes (Signed)
[facility] Provider Note    Event Date/Time   First MD Initiated Contact with Patient 11/18/22 1947     (approximate)   History   Generalized Body Aches   HPI [name] is a 72 y.o. female with history of HTN, DM 2, HLD, protein C and S deficiency on warfarin, prior ischemic colitis and colectomy presenting today for abdominal pain.  Patient states over the past week she has had worsening abdominal pain, nausea, and vomiting.  Last bowel movement was yesterday.  Essentially has not been able to keep anything down for several days.  Worsening headache over the past day given her dehydration.  Otherwise denies chest pain, shortness of breath, dysuria, leg pain, leg swelling.     Physical Exam   Triage Vital Signs: ED Triage Vitals  Encounter Vitals Group     BP 11/18/22 1806 107/74     Systolic BP Percentile --      Diastolic BP Percentile --      Pulse Rate 11/18/22 1806 100     Resp 11/18/22 1806 17     Temp 11/18/22 1806 98.2 F (36.8 C)     Temp Source 11/18/22 1806 Oral     SpO2 11/18/22 1806 92 %     Weight --      Height --      Head Circumference --      Peak Flow --      Pain Score 11/18/22 1801 5     Pain Loc --      Pain Education --      Exclude from Growth Chart --     Most recent vital signs: Vitals:   11/18/22 1806 11/18/22 2130  BP: 107/74 126/76  Pulse: 100 98  Resp: 17 15  Temp: 98.2 F (36.8 C)   SpO2: 92% 91%   Physical Exam: I have reviewed the vital signs and nursing notes. General: Awake, alert, uncomfortable appearing. Head:  Atraumatic, normocephalic.   ENT:  EOM intact, PERRL. Oral mucosa is pink and moist with no lesions. Neck: Neck is supple with full range of motion, No meningeal signs. Cardiovascular:  tachycardic, RR, No murmurs. Peripheral pulses palpable and equal bilaterally. Respiratory:  Symmetrical chest wall expansion.  No rhonchi, rales, or wheezes.  Good air movement throughout.  No use of  accessory muscles.   Musculoskeletal:  No cyanosis or edema. Moving extremities with full ROM Abdomen:  Soft, tenderness palpation throughout abdomen, nondistended. Neuro:  GCS 15, moving all four extremities, interacting appropriately. Speech clear. Psych:  Calm, appropriate.   Skin:  Warm, dry, no rash.    ED Results / Procedures / Treatments   Labs (all labs ordered are listed, but only abnormal results are displayed) Labs Reviewed  COMPREHENSIVE METABOLIC PANEL - Abnormal; Notable for the following components:      Result Value   Sodium 132 (*)    CO2 20 (*)    Glucose, Bld 110 (*)    Creatinine, Ser 1.55 (*)    Calcium 8.6 (*)    GFR, Estimated 35 (*)    All other components within normal limits  CBC - Abnormal; Notable for the following components:   WBC 3.4 (*)    All other components within normal limits  PROTIME-INR - Abnormal; Notable for the following components:   Prothrombin Time 16.7 (*)    INR 1.3 (*)    All other components within normal limits  LACTIC ACID, PLASMA - Abnormal;  Notable for the following components:   Lactic Acid, Venous 2.1 (*)    All other components within normal limits  RESP PANEL BY RT-PCR (RSV, FLU A&B, COVID)  RVPGX2  LIPASE, BLOOD  APTT  URINALYSIS, ROUTINE W REFLEX MICROSCOPIC     EKG    RADIOLOGY CT read pending at time of signout   PROCEDURES:  Critical Care performed: No  Procedures   MEDICATIONS ORDERED IN ED: Medications  lactated ringers bolus 1,000 mL (1,000 mLs Intravenous New Bag/Given 11/18/22 2046)  morphine (PF) 4 MG/ML injection 4 mg (4 mg Intravenous Given 11/18/22 2041)  ondansetron (ZOFRAN) injection 4 mg (4 mg Intravenous Given 11/18/22 2041)  iohexol (OMNIPAQUE) 300 MG/ML solution 75 mL (75 mLs Intravenous Contrast Given 11/18/22 2056)     IMPRESSION / MDM / ASSESSMENT AND PLAN / ED COURSE  I reviewed the triage vital signs and the nursing notes.                              Differential  diagnosis includes, but is not limited to, viral GI, SBO, pancreatitis, appendicitis, enteritis  Patient's presentation is most consistent with acute complicated illness / injury requiring diagnostic workup.  Patient is a 72 year old female with 1 week of nausea and vomiting symptoms.  Associated generalized abdominal pain.  Very poor p.o. intake over the past 2 days.  Mild tachycardia on arrival and exam notable for generalized tenderness palpation throughout the abdomen.  Laboratory workup notable for hyponatremia, hypocarbia, and AKI likely consistent with her poor p.o. intake.  Mild lactic acidosis at 2.1 likely from dehydration.  Viral swabs negative.  CBC unremarkable.  Lipase negative.  Follow-up CT imaging was ordered to assess for intra-abdominal pathology.  Symptoms may be secondary to recent medication addition given diagnosis of small intestinal bacterial overgrowth.  Patient was given fluids, morphine, and Zofran with some symptomatic improvement but not complete resolution.  Patient was signed out pending results of CT exam and reassessment.  The patient is on the cardiac monitor to evaluate for evidence of arrhythmia and/or significant heart rate changes. Clinical Course as of 11/18/22 2308  Tue Nov 18, 2022  2220 Reassessed patient.  Symptoms have improved following fluids, pain medicine and nausea medicine.  Awaiting results of CT for ultimate disposition. [DW]    Clinical Course User Index [DW] Janith Lima, MD     FINAL CLINICAL IMPRESSION(S) / ED DIAGNOSES   Final diagnoses:  Nausea and vomiting, unspecified vomiting type  Generalized abdominal pain     Rx / DC Orders   ED Discharge Orders     None        Note:  This document was prepared using Dragon voice recognition software and may include unintentional dictation errors.   Janith Lima, MD 11/18/22 [phone]

## 2022-11-18 NOTE — Discharge Instructions (Addendum)
I recommend a bland diet for the next several days.  You are being provided a prescription for opiates (also known as narcotics) for pain control.  Opiates can be addictive and should only be used when absolutely necessary for pain control when other alternatives do not work.  We recommend you only use them for the recommended amount of time and only as prescribed.  Please do not take with other sedative medications or alcohol.  Please do not drive, operate machinery, make important decisions while taking opiates.  Please note that these medications can be addictive and have high abuse potential.  Patients can become addicted to narcotics after only taking them for a few days.  Please keep these medications locked away from children, teenagers or any family members with history of substance abuse.  Narcotic pain medicine may also make you constipated.  You may use over-the-counter medications such as MiraLAX, Colace to prevent constipation.  If you become constipated, you may use over-the-counter enemas as needed.  Itching and nausea are also common side effects of narcotic pain medication.  If you develop uncontrolled vomiting or a rash, please stop these medications and seek medical care.

## 2022-11-18 NOTE — Telephone Encounter (Signed)
Spoke with pt's daughter and she stated that EMS was currently at pt's house

## 2022-11-18 NOTE — Telephone Encounter (Signed)
  Symptoms: Severe head pain. Its pressure cramping no fever She's not eating. Very little for 5 days    Attributing factors (medication changes, positional changes, etc. )  No changes in medication    Duration :     Pain Scale?  On 1-10 how woiuld you rate your pain? What makes it better or worse?  10    Blood pressure            Pulse             Temp           Patient was transferred to speak with access nurse.

## 2022-11-19 ENCOUNTER — Telehealth: Payer: Self-pay | Admitting: Internal Medicine

## 2022-11-19 MED ORDER — HYDROCODONE-ACETAMINOPHEN 5-325 MG PO TABS
1.0000 | ORAL_TABLET | Freq: Once | ORAL | Status: AC
  Administered 2022-11-19: 1 via ORAL
  Filled 2022-11-19: qty 1

## 2022-11-19 NOTE — Telephone Encounter (Signed)
Pt daughter would like to be called regarding an ER visit the pt had

## 2022-11-19 NOTE — Telephone Encounter (Signed)
Spoke with pt's daughter and she stated that pt is still having the severe head pain. She is taking the pain medication that she was prescribed from the ED. Daughter stated that it eases the pain enough to allow pt to sleep for about 4 hours then she is back up with the severe pain again. Daughter stated that pt has had her put pt's head in the sink under cold water to help relieve the pain. Daughter stated that she is worried about pt having a potential brain bleed. I went ahead and scheduled pt in the opening that we have tomorrow at 10 am but advised the daughter that if she is still in that much pain that she may need to go back to the ED. Daughter stated that pt will probably put up a fight to that.

## 2022-11-19 NOTE — Telephone Encounter (Signed)
Pt was transported to ED.

## 2022-11-19 NOTE — Telephone Encounter (Signed)
Spoke with pt and daughter and informed her of the message below. Daughter stated that she isn't sure that pt will go to the ED tonight so she asked that we keep her on the schedule for tomorrow with Dr. Darrick Huntsman.

## 2022-11-20 ENCOUNTER — Ambulatory Visit: Payer: PPO | Admitting: Internal Medicine

## 2022-11-20 DIAGNOSIS — I10 Essential (primary) hypertension: Secondary | ICD-10-CM | POA: Diagnosis not present

## 2022-11-20 DIAGNOSIS — K760 Fatty (change of) liver, not elsewhere classified: Secondary | ICD-10-CM | POA: Diagnosis not present

## 2022-11-20 DIAGNOSIS — D6859 Other primary thrombophilia: Secondary | ICD-10-CM | POA: Diagnosis not present

## 2022-11-20 DIAGNOSIS — E119 Type 2 diabetes mellitus without complications: Secondary | ICD-10-CM | POA: Diagnosis not present

## 2022-11-20 DIAGNOSIS — Z79899 Other long term (current) drug therapy: Secondary | ICD-10-CM | POA: Diagnosis not present

## 2022-11-20 DIAGNOSIS — R251 Tremor, unspecified: Secondary | ICD-10-CM | POA: Diagnosis not present

## 2022-11-20 DIAGNOSIS — G43909 Migraine, unspecified, not intractable, without status migrainosus: Secondary | ICD-10-CM | POA: Diagnosis not present

## 2022-11-20 DIAGNOSIS — R4189 Other symptoms and signs involving cognitive functions and awareness: Secondary | ICD-10-CM | POA: Diagnosis not present

## 2022-11-20 DIAGNOSIS — R944 Abnormal results of kidney function studies: Secondary | ICD-10-CM | POA: Diagnosis not present

## 2022-11-20 DIAGNOSIS — Z7901 Long term (current) use of anticoagulants: Secondary | ICD-10-CM | POA: Diagnosis not present

## 2022-11-20 DIAGNOSIS — E785 Hyperlipidemia, unspecified: Secondary | ICD-10-CM | POA: Diagnosis not present

## 2022-11-20 DIAGNOSIS — K219 Gastro-esophageal reflux disease without esophagitis: Secondary | ICD-10-CM | POA: Diagnosis not present

## 2022-11-20 DIAGNOSIS — Z86711 Personal history of pulmonary embolism: Secondary | ICD-10-CM | POA: Diagnosis not present

## 2022-11-20 DIAGNOSIS — E559 Vitamin D deficiency, unspecified: Secondary | ICD-10-CM | POA: Diagnosis not present

## 2022-11-20 DIAGNOSIS — Z7982 Long term (current) use of aspirin: Secondary | ICD-10-CM | POA: Diagnosis not present

## 2022-11-20 DIAGNOSIS — R9431 Abnormal electrocardiogram [ECG] [EKG]: Secondary | ICD-10-CM | POA: Diagnosis not present

## 2022-11-20 DIAGNOSIS — G4440 Drug-induced headache, not elsewhere classified, not intractable: Secondary | ICD-10-CM | POA: Diagnosis not present

## 2022-11-20 DIAGNOSIS — R7989 Other specified abnormal findings of blood chemistry: Secondary | ICD-10-CM | POA: Diagnosis not present

## 2022-11-20 DIAGNOSIS — F32A Depression, unspecified: Secondary | ICD-10-CM | POA: Diagnosis not present

## 2022-11-20 DIAGNOSIS — R519 Headache, unspecified: Secondary | ICD-10-CM | POA: Diagnosis not present

## 2022-11-21 DIAGNOSIS — G4440 Drug-induced headache, not elsewhere classified, not intractable: Secondary | ICD-10-CM | POA: Diagnosis not present

## 2022-11-21 DIAGNOSIS — R944 Abnormal results of kidney function studies: Secondary | ICD-10-CM | POA: Diagnosis not present

## 2022-11-21 DIAGNOSIS — I10 Essential (primary) hypertension: Secondary | ICD-10-CM | POA: Diagnosis not present

## 2022-11-21 DIAGNOSIS — R251 Tremor, unspecified: Secondary | ICD-10-CM | POA: Diagnosis not present

## 2022-11-25 ENCOUNTER — Other Ambulatory Visit: Payer: Self-pay | Admitting: Internal Medicine

## 2022-11-26 ENCOUNTER — Telehealth: Payer: Self-pay | Admitting: Pharmacy Technician

## 2022-11-26 ENCOUNTER — Other Ambulatory Visit (HOSPITAL_COMMUNITY): Payer: Self-pay

## 2022-11-26 NOTE — Telephone Encounter (Signed)
Pharmacy Patient Advocate Encounter   Received notification from CoverMyMeds that prior authorization for Ondansetron HCl 4MG  tablets is required/requested.   Insurance verification completed.   The patient is insured through [facility] ADVANTAGE/RX ADVANCE .   Per test claim: PA required; PA submitted to above mentioned insurance via CoverMyMeds Key/confirmation #/EOC [id number] Status is pending

## 2022-11-26 NOTE — Telephone Encounter (Signed)
Pharmacy Patient Advocate Encounter  Received notification from [facility] ADVANTAGE/RX ADVANCE that Prior Authorization for ONdansetron has been DENIED.  Full denial letter will be uploaded to the media tab. See denial reason below.   PA #/Case ID/Reference #:

## 2022-11-28 DIAGNOSIS — R009 Unspecified abnormalities of heart beat: Secondary | ICD-10-CM | POA: Diagnosis not present

## 2022-12-01 ENCOUNTER — Encounter: Payer: Self-pay | Admitting: Internal Medicine

## 2022-12-01 ENCOUNTER — Ambulatory Visit (INDEPENDENT_AMBULATORY_CARE_PROVIDER_SITE_OTHER): Payer: PPO | Admitting: Internal Medicine

## 2022-12-01 VITALS — BP 128/76 | HR 77 | Ht 61.0 in | Wt 158.2 lb

## 2022-12-01 DIAGNOSIS — Z09 Encounter for follow-up examination after completed treatment for conditions other than malignant neoplasm: Secondary | ICD-10-CM | POA: Insufficient documentation

## 2022-12-01 DIAGNOSIS — Z8639 Personal history of other endocrine, nutritional and metabolic disease: Secondary | ICD-10-CM

## 2022-12-01 DIAGNOSIS — N179 Acute kidney failure, unspecified: Secondary | ICD-10-CM | POA: Diagnosis not present

## 2022-12-01 DIAGNOSIS — Z7901 Long term (current) use of anticoagulants: Secondary | ICD-10-CM | POA: Diagnosis not present

## 2022-12-01 DIAGNOSIS — R1010 Upper abdominal pain, unspecified: Secondary | ICD-10-CM | POA: Diagnosis not present

## 2022-12-01 DIAGNOSIS — R7989 Other specified abnormal findings of blood chemistry: Secondary | ICD-10-CM | POA: Diagnosis not present

## 2022-12-01 DIAGNOSIS — E860 Dehydration: Secondary | ICD-10-CM | POA: Insufficient documentation

## 2022-12-01 LAB — BASIC METABOLIC PANEL
BUN: 14 mg/dL (ref 6–23)
CO2: 31 meq/L (ref 19–32)
Calcium: 8.8 mg/dL (ref 8.4–10.5)
Chloride: 104 meq/L (ref 96–112)
Creatinine, Ser: 1 mg/dL (ref 0.40–1.20)
GFR: 55.83 mL/min — ABNORMAL LOW (ref >60.00)
Glucose, Bld: 102 mg/dL — ABNORMAL HIGH (ref 70–99)
Potassium: 4.2 meq/L (ref 3.5–5.1)
Sodium: 142 meq/L (ref 135–145)

## 2022-12-01 MED ORDER — MIRTAZAPINE 15 MG PO TABS: ORAL_TABLET | ORAL | 2 refills | Status: DC

## 2022-12-01 NOTE — Assessment & Plan Note (Signed)
Patient is stable post discharge and has no new issues or questions about her discharge plans,  tttransplan

## 2022-12-01 NOTE — Progress Notes (Signed)
Subjective:  Patient ID: [name], female    DOB: [birth year]  Age: 72 y.o. MRN: [id number]  CC: The primary encounter diagnosis was Encounter for current long-term use of anticoagulants. Diagnoses of Acute renal failure, unspecified acute renal failure type (HCC), Abnormal TSH, Dehydration with hyponatremia, Anticoagulation monitoring, INR range 2-3, Pain of upper abdomen, and Hospital discharge follow-up were also pertinent to this visit.   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 She was admitted to [facility] Nov 14-15 with two week histor y of unrelenting migraine headache,  recurrent N/V resulting in acute renal failure  attributed to Septra intolerance (started for treatment of SIBO).  She had several ER visits   Since discharge from [facility]  on Nov 15 she has improved steadily and states that she feels much etter except for mild weakness  .  headaches 100%  better ,   stomach,  80% better.  Appetite improved  but now everything has tasted "weird"  for the past 2 weeks. Mouth is dry;  has not  been taking benadryl or metoprolol . Used zofran once since discharge ,  and phenergan    and ibuprofen once  since discharge.   Moving bowels every 2-3 days,  alts between solid and diarrhea   Taking trazodone for sleep (not new)      During her hospitalization she was noted to have a worsening tremor of both hands and head,  which has improved  but not resolved.  Has been referred to [facility]    Medication  changes:  fioricet and alprazolam were discontinued.   KIDNEY FUNCTION  AND INR  NEEDS RECHECKING        Outpatient Medications Prior to Visit  Medication Sig Dispense Refill   ALPRAZolam (XANAX) 0.25 MG tablet  TAKE 1 TABLET BY MOUTH AT BEDTIME AS NEEDED FOR ANXIETY 30 tablet 2   amLODipine (NORVASC) 5 MG tablet Take 1 tablet (5 mg total) by mouth daily. 90 tablet 1   aspirin 81 MG tablet Take 81 mg by mouth daily.     atorvastatin (LIPITOR) 40 MG tablet Take 1 tablet (40 mg total) by mouth daily. 90 tablet 3   buPROPion (WELLBUTRIN XL) 150 MG 24 hr tablet Take 1 tablet (150 mg total) by mouth daily. 90 tablet 1   Cholecalciferol (VITAMIN D3) 1000 UNITS CAPS Take by mouth.     cyanocobalamin (VITAMIN B12) 1000 MCG/ML injection INJECT INTO THE MUSCLE EVERY 30 DAYS 10 mL 0   diphenhydrAMINE (BENADRYL) 25 mg capsule Take 25 mg by mouth every 8 (eight) hours as needed.     furosemide (LASIX) 20 MG tablet Take 1 tablet by mouth once daily (Patient taking differently: Take 20 mg by mouth 2 (two) times a week. Tuesday and Friday) 90 tablet 0   hydrochlorothiazide (HYDRODIURIL) 25 MG tablet Take 1 tablet (25 mg total) by mouth daily. 90 tablet 3   ibuprofen (ADVIL) 600 MG tablet Take 600 mg by mouth every 8 (eight) hours as needed.     Omega-3 Fatty Acids (FISH OIL) 1000 MG CAPS Take 1 capsule daily by mouth.     omeprazole (PRILOSEC) 20 MG capsule Take 1 capsule (20 mg  total) by mouth daily.     ondansetron (ZOFRAN) 4 MG tablet Take 1 tablet (4 mg total) by mouth every 8 (eight) hours as needed for nausea or vomiting. 20 tablet 0   promethazine (PHENERGAN) 12.5 MG tablet Take 12.5 mg by mouth every 6 (six) hours as needed.     telmisartan (MICARDIS) 80 MG tablet Take 1 tablet by mouth once daily 90 tablet 1   traZODone (DESYREL) 50 MG tablet Take 1 tablet (50 mg total) by mouth at bedtime. 90 tablet 1   warfarin (COUMADIN) 3 MG tablet Take 3 mg by mouth daily.     warfarin (COUMADIN) 4 MG tablet TAKE 1 TABLET BY MOUTH ONCE DAILY ON  MONDAY  AND  THURSDAY  OTHER  DAYS  TAKE  5MG  30 tablet 0   warfarin (COUMADIN) 5 MG tablet Take 1 tablet by mouth once daily 30 tablet 0   ARIPiprazole (ABILIFY) 2 MG tablet  Take 1 tablet (2 mg total) by mouth daily. (Patient not taking: Reported on 12/01/2022) 90 tablet 1   butalbital-acetaminophen-caffeine (FIORICET) 50-325-40 MG tablet TAKE 1 TABLET BY MOUTH EVERY 12 HOURS AS NEEDED FOR HEADACHE (Patient not taking: Reported on 12/01/2022) 60 tablet 05   glucose blood test strip Use to check blood sugar once daily. (Patient not taking: Reported on 11/07/2022) 100 each 5   HYDROcodone-acetaminophen (NORCO/VICODIN) 5-325 MG tablet Take 1 tablet by mouth every 6 (six) hours as needed. (Patient not taking: Reported on 12/01/2022) 10 tablet 0   ondansetron (ZOFRAN-ODT) 4 MG disintegrating tablet Take 1 tablet (4 mg total) by mouth every 6 (six) hours as needed for nausea or vomiting. (Patient not taking: Reported on 12/01/2022) 20 tablet 0   OneTouch Delica Lancets 33G MISC Use to check blood sugar once daily. (Patient not taking: Reported on 12/01/2022) 100 each 5   sulfamethoxazole-trimethoprim (BACTRIM DS) 800-160 MG tablet TAKE 1 TABLET BY MOUTH TWICE DAILY FOR 14 DAYS (Patient not taking: Reported on 12/01/2022) 28 tablet 0   Syringe/Needle, Disp, (SYRINGE 3CC/25GX1") 25G X 1" 3 ML MISC Use for b12 injections (Patient not taking: Reported on 12/01/2022) 50 each 0   triamcinolone cream (KENALOG) 0.1 % Apply 1 application topically 2 (two) times daily. (Patient not taking: Reported on 11/07/2022) 30 g 0   Facility-Administered Medications Prior to Visit  Medication Dose Route Frequency Provider Last Rate Last Admin   cyanocobalamin ((VITAMIN B-12)) injection 1,000 mcg  1,000 mcg Intramuscular Once Sherlene Shams, MD        Review of Systems;  Patient denies headache, fevers, malaise, unintentional weight loss, skin rash, eye pain, sinus congestion and sinus pain, sore throat, dysphagia,  hemoptysis , cough, dyspnea, wheezing, chest pain, palpitations, orthopnea, edema, abdominal pain, nausea, melena, diarrhea, constipation, flank pain, dysuria, hematuria, urinary   Frequency, nocturia, numbness, tingling, seizures,  Focal weakness, Loss of consciousness,  Tremor, insomnia, depression, anxiety, and suicidal ideation.      Objective:  BP 128/76   Pulse 77   Ht 5\' 1"  (1.549 m)   Wt 158 lb 3.2 oz (71.8 kg)   SpO2 96%   BMI 29.89 kg/m   BP Readings from Last 3 Encounters:  12/01/22 128/76  11/19/22 124/65  11/07/22 110/68    Wt Readings from Last 3 Encounters:  12/01/22 158 lb 3.2 oz (71.8 kg)  11/07/22 158 lb 9.6 oz (71.9 kg)  11/03/22 157 lb 3.2 oz (71.3 kg)    Physical Exam Vitals reviewed.  Constitutional:  General: She is not in acute distress.    Appearance: Normal appearance. She is normal weight. She is not ill-appearing, toxic-appearing or diaphoretic.  HENT:     Head: Normocephalic.  Eyes:     General: No scleral icterus.       Right eye: No discharge.        Left eye: No discharge.     Conjunctiva/sclera: Conjunctivae normal.  Cardiovascular:     Rate and Rhythm: Normal rate and regular rhythm.     Heart sounds: Normal heart sounds.  Pulmonary:     Effort: Pulmonary effort is normal. No respiratory distress.     Breath sounds: Normal breath sounds.  Musculoskeletal:        General: Normal range of motion.  Skin:    General: Skin is warm and dry.  Neurological:     General: No focal deficit present.     Mental Status: She is alert and oriented to person, place, and time. Mental status is at baseline.  Psychiatric:        Mood and Affect: Mood normal.        Behavior: Behavior normal.        Thought Content: Thought content normal.        Judgment: Judgment normal.    Lab Results  Component Value Date   HGBA1C 6.1 02/04/2022   HGBA1C 6.1 10/28/2021   HGBA1C 5.9 (H) 02/15/2021    Lab Results  Component Value Date   CREATININE 1.00 12/01/2022   CREATININE 1.55 (H) 11/18/2022   CREATININE 0.99 02/04/2022    Lab Results  Component Value Date   WBC 3.4 (L) 11/18/2022   HGB 14.5 11/18/2022   HCT 43.2  11/18/2022   PLT 175 11/18/2022   GLUCOSE 102 (H) 12/01/2022   CHOL 137 02/04/2022   TRIG 120.0 02/04/2022   HDL 49.90 02/04/2022   LDLDIRECT 111.0 08/13/2016   LDLCALC 63 02/04/2022   ALT 28 11/18/2022   AST 35 11/18/2022   NA 142 12/01/2022   K 4.2 12/01/2022   CL 104 12/01/2022   CREATININE 1.00 12/01/2022   BUN 14 12/01/2022   CO2 31 12/01/2022   TSH 2.023 10/23/2021   INR 1.3 (H) 11/18/2022   HGBA1C 6.1 02/04/2022   MICROALBUR 0.8 11/03/2022    CT ABDOMEN PELVIS W CONTRAST  Result Date: 11/18/2022 CLINICAL DATA:  Severe abdominal pain most prominent periumbilical. Nausea with vomiting. History of ischemic colitis and colectomy. EXAM: CT ABDOMEN AND PELVIS WITH CONTRAST TECHNIQUE: Multidetector CT imaging of the abdomen and pelvis was performed using the standard protocol following bolus administration of intravenous contrast. RADIATION DOSE REDUCTION: This exam was performed according to the departmental dose-optimization program which includes automated exposure control, adjustment of the mA and/or kV according to patient size and/or use of iterative reconstruction technique. CONTRAST:  75mL OMNIPAQUE IOHEXOL 300 MG/ML  SOLN COMPARISON:  CT abdomen and pelvis 08/06/2021 FINDINGS: Lower chest: No acute abnormality. Hepatobiliary: No focal liver abnormality is seen. No gallstones, gallbladder wall thickening, or biliary dilatation. Pancreas: Unremarkable. Spleen: Cyst or hemangioma in the spleen.  Otherwise unremarkable. Adrenals/Urinary Tract: Normal adrenal glands. Scarring in the lower pole of the left kidney. No urinary calculi or hydronephrosis. Unremarkable bladder. Stomach/Bowel: Postoperative change of partial colectomy with anastomosis in the left lower quadrant. Adjacent calcified fat necrosis. No bowel wall thickening or obstruction. The appendix is not visualized. Stomach is within normal limits. Vascular/Lymphatic: Aortic atherosclerosis. No enlarged abdominal or pelvic  lymph nodes. Reproductive: Hysterectomy.  No adnexal mass. Other: No free intraperitoneal fluid or air. Musculoskeletal: No acute fracture. IMPRESSION: 1. No acute abnormality in the abdomen or pelvis. 2. Postoperative change of partial colectomy with anastomosis in the left lower quadrant. No bowel obstruction. Aortic Atherosclerosis (ICD10-I70.0). Electronically Signed   By: Minerva Fester M.D.   On: 11/18/2022 23:47    Assessment & Plan:  .Encounter for current long-term use of anticoagulants -     Protime-INR; Future  Acute renal failure, unspecified acute renal failure type [facility]) Assessment & Plan: Secondary to dehydration secondary to protracted vomiting.  Now resolved  Lab Results  Component Value Date   CREATININE 1.00 12/01/2022   Lab Results  Component Value Date   NA 142 12/01/2022   K 4.2 12/01/2022   CL 104 12/01/2022   CO2 31 12/01/2022     Orders: -     Basic metabolic panel  Abnormal TSH -     TSH  Dehydration with hyponatremia Assessment & Plan: Noted durin nov 12 ER visit .  Resolved   Lab Results  Component Value Date   NA 142 12/01/2022   K 4.2 12/01/2022   CL 104 12/01/2022   CO2 31 12/01/2022   Lab Results  Component Value Date   CREATININE 1.00 12/01/2022      Anticoagulation monitoring, INR range 2-3 Assessment & Plan: INR is pending    Lab Results  Component Value Date   INR 1.3 (H) 11/18/2022   INR 1.1 11/17/2022   INR 4.0 (H) 11/10/2022   PROTIME 57.9 (A) 02/17/2014      Pain of upper abdomen Assessment & Plan: Symptoms suggestive of recurrent SIBO with bloating distension and pain  have resolved.  She did not tolerate septra trial.     Hospital discharge follow-up Assessment & Plan: Patient is stable post discharge and has no new issues or questions about her discharge plans,  tttransplan    Other orders -     Mirtazapine; 1/2 to 1 tablet 30 minutes before bedtime daily  Dispense: 30 tablet; Refill: 2     I  provided 30 minutes of face-to-face time during this encounter reviewing patient's last visit with me, patient's  most recent visit with cardiology,  nephrology,  and neurology,  recent surgical and non surgical procedures, previous  labs and imaging studies, counseling on currently addressed issues,  and post visit ordering to diagnostics and therapeutics .   Follow-up: Return in about 4 weeks (around 12/29/2022) for depression, .   Sherlene Shams, MD

## 2022-12-01 NOTE — Assessment & Plan Note (Signed)
Symptoms suggestive of recurrent SIBO with bloating distension and pain  have resolved.  She did not tolerate septra trial.

## 2022-12-01 NOTE — Assessment & Plan Note (Signed)
Secondary to dehydration secondary to protracted vomiting.  Now resolved  Lab Results  Component Value Date   CREATININE 1.00 12/01/2022   Lab Results  Component Value Date   NA 142 12/01/2022   K 4.2 12/01/2022   CL 104 12/01/2022   CO2 31 12/01/2022

## 2022-12-01 NOTE — Assessment & Plan Note (Signed)
INR is pending    Lab Results  Component Value Date   INR 1.3 (H) 11/18/2022   INR 1.1 11/17/2022   INR 4.0 (H) 11/10/2022   PROTIME 57.9 (A) 02/17/2014

## 2022-12-01 NOTE — Patient Instructions (Addendum)
Stop the trazodone and start mirtazapine.  Take it about  1 hour  before bedtime .  It will make you drowsy but will improve your appetite over time

## 2022-12-01 NOTE — Assessment & Plan Note (Signed)
Noted durin nov 12 ER visit .  Resolved   Lab Results  Component Value Date   NA 142 12/01/2022   K 4.2 12/01/2022   CL 104 12/01/2022   CO2 31 12/01/2022   Lab Results  Component Value Date   CREATININE 1.00 12/01/2022

## 2022-12-02 ENCOUNTER — Telehealth: Payer: Self-pay

## 2022-12-02 ENCOUNTER — Other Ambulatory Visit (INDEPENDENT_AMBULATORY_CARE_PROVIDER_SITE_OTHER): Payer: PPO

## 2022-12-02 DIAGNOSIS — R7989 Other specified abnormal findings of blood chemistry: Secondary | ICD-10-CM | POA: Diagnosis not present

## 2022-12-02 LAB — TSH: TSH: 2.16 u[IU]/mL (ref 0.35–5.50)

## 2022-12-02 NOTE — Telephone Encounter (Signed)
Detailed Vm left as well informing pt she needs a hospital follow up appt

## 2022-12-02 NOTE — Telephone Encounter (Signed)
LMTCB need to schedule pt for a non fasting lab appt.

## 2022-12-02 NOTE — Telephone Encounter (Signed)
-----   Message from Sherlene Shams sent at 12/02/2022  1:35 PM EST ----- I ordered her PT incorrectly as future so she will need to return for the PT/INR when It is convenient for her ----- Message ----- From: Sherril Cong Sent: 12/02/2022   1:27 PM EST To: Sherlene Shams, MD; Warden Fillers, CMA  No, that's a separate tube. The patient will have to be re-drawn ----- Message ----- From: Sherlene Shams, MD Sent: 12/02/2022  12:34 PM EST To: Warden Fillers, CMA; Sherril Cong  Can  her PT/INR be added to yesterday's labs ? It was incorrectly ordered as future

## 2022-12-03 ENCOUNTER — Other Ambulatory Visit (INDEPENDENT_AMBULATORY_CARE_PROVIDER_SITE_OTHER): Payer: PPO

## 2022-12-03 DIAGNOSIS — Z7901 Long term (current) use of anticoagulants: Secondary | ICD-10-CM

## 2022-12-03 NOTE — Addendum Note (Signed)
Addended by: Jarvis Morgan D on: 12/03/2022 03:02 PM   Modules accepted: Orders

## 2022-12-04 LAB — PROTIME-INR
INR: 1.3 — ABNORMAL HIGH
Prothrombin Time: 13.5 s — ABNORMAL HIGH (ref 9.0–11.5)

## 2022-12-04 MED ORDER — WARFARIN SODIUM 4 MG PO TABS: ORAL_TABLET | ORAL | 0 refills | Status: DC

## 2022-12-04 MED ORDER — WARFARIN SODIUM 5 MG PO TABS: ORAL_TABLET | ORAL | 0 refills | Status: DC

## 2022-12-04 NOTE — Assessment & Plan Note (Signed)
INR is low   on 4 mg x 5 ,  5 mg x 2 = 30 mg weekly dose.  .  [name] to regimen to 5 mg  daily starting today (Thursday Nov 28) and  take 4 mg on Sundays and Wednesdays ( 5 mg  5 days per week,  4 mg 2 days per week) - 33 mg weekly  and repeat your  INR  in 2 weeks.   Lab Results  Component Value Date   INR 1.3 (H) 12/03/2022   INR 1.3 (H) 11/18/2022   INR 1.1 11/17/2022   PROTIME 57.9 (A) 02/17/2014

## 2022-12-04 NOTE — Addendum Note (Signed)
Addended by: Sherlene Shams on: 12/04/2022 10:51 AM   Modules accepted: Orders

## 2022-12-11 ENCOUNTER — Telehealth: Payer: Self-pay

## 2022-12-11 NOTE — Telephone Encounter (Signed)
LMTCB in regards to lab results.  

## 2022-12-11 NOTE — Telephone Encounter (Signed)
-----   Message from Sherlene Shams sent at 12/04/2022 10:48 AM EST ----- Confirm that she received the message  via mychart  re  change in warfarin to:  5 mg  daily starting today (Thursday Nov 28) and  take 4 mg on Sundays and Wednesdays ( 5 mg  5 days per week,  4 mg 2 days per week)   and repeat yourlINR in 2 weeks.

## 2022-12-15 DIAGNOSIS — G2581 Restless legs syndrome: Secondary | ICD-10-CM | POA: Diagnosis not present

## 2022-12-15 DIAGNOSIS — G43009 Migraine without aura, not intractable, without status migrainosus: Secondary | ICD-10-CM | POA: Diagnosis not present

## 2022-12-15 DIAGNOSIS — R7989 Other specified abnormal findings of blood chemistry: Secondary | ICD-10-CM | POA: Diagnosis not present

## 2022-12-17 ENCOUNTER — Other Ambulatory Visit: Payer: Self-pay | Admitting: Internal Medicine

## 2022-12-17 MED ORDER — FUROSEMIDE 20 MG PO TABS: 20.0000 mg | ORAL_TABLET | ORAL | 1 refills | Status: DC

## 2022-12-19 DIAGNOSIS — R519 Headache, unspecified: Secondary | ICD-10-CM | POA: Diagnosis not present

## 2022-12-22 ENCOUNTER — Other Ambulatory Visit (INDEPENDENT_AMBULATORY_CARE_PROVIDER_SITE_OTHER): Payer: PPO

## 2022-12-22 DIAGNOSIS — Z7901 Long term (current) use of anticoagulants: Secondary | ICD-10-CM | POA: Diagnosis not present

## 2022-12-22 LAB — PROTIME-INR
INR: 1.7 {ratio} — ABNORMAL HIGH (ref 0.8–1.0)
Prothrombin Time: 18.1 s — ABNORMAL HIGH (ref 9.6–13.1)

## 2022-12-22 NOTE — Addendum Note (Signed)
Addended by: Warden Fillers on: 12/22/2022 03:09 PM   Modules accepted: Orders

## 2022-12-22 NOTE — Addendum Note (Signed)
Addended by: Warden Fillers on: 12/22/2022 10:48 AM   Modules accepted: Orders

## 2022-12-23 ENCOUNTER — Encounter: Payer: Self-pay | Admitting: Internal Medicine

## 2022-12-23 ENCOUNTER — Telehealth: Payer: Self-pay

## 2022-12-23 DIAGNOSIS — Z7901 Long term (current) use of anticoagulants: Secondary | ICD-10-CM

## 2022-12-23 NOTE — Telephone Encounter (Signed)
-----   Message from Sherlene Shams sent at 12/23/2022  9:08 AM EST ----- Your INR is still low.  Please clarify your current coumadin regimen

## 2022-12-23 NOTE — Telephone Encounter (Signed)
LMTCB in regards to lab results.  

## 2022-12-24 NOTE — Telephone Encounter (Signed)
Spoke with pt and advised her that she needs to increase coumadin dose to 5 mg daily. Pt gave a verbal understanding and scheduled lab appt in 2 weeks.

## 2022-12-24 NOTE — Telephone Encounter (Signed)
Patient return phone call and she stated that she takes take 4 mg Sunday & Wednesday and 5 mg all other days as earlier instructed.

## 2022-12-24 NOTE — Telephone Encounter (Signed)
Also sent a telephone encounter.

## 2022-12-24 NOTE — Assessment & Plan Note (Signed)
INR STILL LOW.  ADVISED TO NCREASE DOSE TO 5 MG ALL 7 DAYS,  REPEAT IN 2 WEEKS   Lab Results  Component Value Date   INR 1.7 (H) 12/22/2022   INR 1.3 (H) 12/03/2022   INR 1.3 (H) 11/18/2022   PROTIME 57.9 (A) 02/17/2014

## 2022-12-26 NOTE — Assessment & Plan Note (Signed)
 INR STILL LOW.  ADVISED TO NCREASE DOSE TO 5 MG ALL 7 DAYS,  REPEAT IN 2 WEEKS   Lab Results  Component Value Date   INR 1.7 (H) 12/22/2022   INR 1.3 (H) 12/03/2022   INR 1.3 (H) 11/18/2022   PROTIME 57.9 (A) 02/17/2014

## 2023-01-02 ENCOUNTER — Telehealth: Payer: Self-pay | Admitting: Internal Medicine

## 2023-01-02 DIAGNOSIS — Z7901 Long term (current) use of anticoagulants: Secondary | ICD-10-CM

## 2023-01-02 NOTE — Telephone Encounter (Signed)
Patient need lab orders.

## 2023-01-08 ENCOUNTER — Other Ambulatory Visit (INDEPENDENT_AMBULATORY_CARE_PROVIDER_SITE_OTHER): Payer: PPO

## 2023-01-08 DIAGNOSIS — Z7901 Long term (current) use of anticoagulants: Secondary | ICD-10-CM

## 2023-01-09 ENCOUNTER — Encounter: Payer: Self-pay | Admitting: Internal Medicine

## 2023-01-09 DIAGNOSIS — Z7901 Long term (current) use of anticoagulants: Secondary | ICD-10-CM

## 2023-01-09 LAB — PROTIME-INR
INR: 2.5 — ABNORMAL HIGH
Prothrombin Time: 24.7 s — ABNORMAL HIGH (ref 9.0–11.5)

## 2023-01-09 NOTE — Assessment & Plan Note (Signed)
 INR is therapeutic on 5 mg daily   Lab Results  Component Value Date   INR 2.5 (H) 01/08/2023   INR 1.7 (H) 12/22/2022   INR 1.3 (H) 12/03/2022   PROTIME 57.9 (A) 02/17/2014

## 2023-01-12 ENCOUNTER — Other Ambulatory Visit: Payer: Self-pay

## 2023-01-12 DIAGNOSIS — Z7901 Long term (current) use of anticoagulants: Secondary | ICD-10-CM

## 2023-01-13 ENCOUNTER — Ambulatory Visit (INDEPENDENT_AMBULATORY_CARE_PROVIDER_SITE_OTHER): Payer: PPO | Admitting: Internal Medicine

## 2023-01-13 ENCOUNTER — Encounter: Payer: Self-pay | Admitting: Internal Medicine

## 2023-01-13 VITALS — BP 118/58 | HR 85 | Ht 61.0 in | Wt 157.4 lb

## 2023-01-13 DIAGNOSIS — E1129 Type 2 diabetes mellitus with other diabetic kidney complication: Secondary | ICD-10-CM | POA: Diagnosis not present

## 2023-01-13 DIAGNOSIS — E1169 Type 2 diabetes mellitus with other specified complication: Secondary | ICD-10-CM

## 2023-01-13 DIAGNOSIS — E785 Hyperlipidemia, unspecified: Secondary | ICD-10-CM

## 2023-01-13 DIAGNOSIS — Z7901 Long term (current) use of anticoagulants: Secondary | ICD-10-CM

## 2023-01-13 DIAGNOSIS — I10 Essential (primary) hypertension: Secondary | ICD-10-CM | POA: Diagnosis not present

## 2023-01-13 DIAGNOSIS — F4329 Adjustment disorder with other symptoms: Secondary | ICD-10-CM | POA: Diagnosis not present

## 2023-01-13 DIAGNOSIS — I700 Atherosclerosis of aorta: Secondary | ICD-10-CM | POA: Diagnosis not present

## 2023-01-13 MED ORDER — WARFARIN SODIUM 5 MG PO TABS: ORAL_TABLET | ORAL | 0 refills | Status: DC

## 2023-01-13 MED ORDER — TELMISARTAN 80 MG PO TABS: 80.0000 mg | ORAL_TABLET | Freq: Every day | ORAL | 1 refills | Status: DC

## 2023-01-13 NOTE — Assessment & Plan Note (Signed)
 INr is maintained in therapeutic range on 5 g coumadin daily   Lab Results  Component Value Date   INR 2.5 (H) 01/08/2023   INR 1.7 (H) 12/22/2022   INR 1.3 (H) 12/03/2022   PROTIME 57.9 (A) 02/17/2014

## 2023-01-13 NOTE — Assessment & Plan Note (Signed)
.  Reviewed findings of prior CT scan today..  Patient is tolerating high potency statin therapy with atorvastatin 40 mg daily

## 2023-01-13 NOTE — Progress Notes (Signed)
 Subjective:  Patient ID: [name], female    DOB: September 13, 1950  Age: 73 y.o. MRN: [id number]  CC: The primary encounter diagnosis was Controlled type 2 diabetes mellitus with microalbuminuria, without long-term current use of insulin (HCC). Diagnoses of Hyperlipidemia associated with type 2 diabetes mellitus (HCC), Hypertension, unspecified type, Microalbuminuria due to type 2 diabetes mellitus (HCC), Grief reaction with prolonged bereavement, Aortic arch atherosclerosis (HCC), and Anticoagulation monitoring, INR range 2-3 were also pertinent to this visit.   HPI [name] presents for  Chief Complaint  Patient presents with   Medical Management of Chronic Issues    1 month follow up on depression   Haeli WAS PRESCRIBED MIRTAZAPINE  at list visit , which was a hospital follow up FOR COMPLICATED GRIEF AND DYSGEUSIA  HOWEVER SHE NEVER STARTED THE MEDICATION.   She reports feeling better emotionally,  had an enjoyable holiday despite missing her beloved husband who died 2 years ago.  She is starting to have days of optimism. Days without tears,  and feels more self reliant as she has had to manage  the household issues that her husband used to handle.  Attending a small group bible study with 38 old friends that she has known since 5th grade , and looks forward to the gatherings    2 HTN:  Patient is taking her medications as prescribed and notes no adverse effects.  Home BP readings have been done about once per week and are  generally < 130/80 .  She is avoiding added salt in her diet and walking regularly about 3 times per week for exercise  .      Outpatient Medications Prior to Visit  Medication Sig Dispense Refill   amLODipine  (NORVASC ) 5 MG tablet Take 1 tablet (5 mg total) by mouth daily. 90 tablet 1   aspirin 81 MG tablet Take 81 mg by mouth daily.     atorvastatin  (LIPITOR) 40 MG tablet Take 1 tablet (40 mg total) by mouth daily. 90 tablet 3   buPROPion  (WELLBUTRIN  XL) 150 MG 24  hr tablet Take 1 tablet (150 mg total) by mouth daily. 90 tablet 1   Cholecalciferol (VITAMIN D3) 1000 UNITS CAPS Take by mouth.     clonazePAM (KLONOPIN) 1 MG tablet Take 1 mg by mouth at bedtime as needed.     cyanocobalamin  (VITAMIN B12) 1000 MCG/ML injection INJECT 1ML INTO THE MUSCLE EVERY 30 DAYS 10 mL 0   diphenhydrAMINE  (BENADRYL ) 25 mg capsule Take 25 mg by mouth every 8 (eight) hours as needed.     furosemide  (LASIX ) 20 MG tablet Take 1 tablet (20 mg total) by mouth 2 (two) times a week. Tuesday and Friday 30 tablet 1   hydrochlorothiazide  (HYDRODIURIL ) 25 MG tablet Take 1 tablet (25 mg total) by mouth daily. 90 tablet 3   ibuprofen (ADVIL) 600 MG tablet Take 600 mg by mouth every 8 (eight) hours as needed.     Omega-3 Fatty Acids (FISH OIL) 1000 MG CAPS Take 1 capsule daily by mouth.     omeprazole  (PRILOSEC) 20 MG capsule Take 1 capsule (20 mg total) by mouth daily.     ondansetron  (ZOFRAN ) 4 MG tablet Take 1 tablet (4 mg total) by mouth every 8 (eight) hours as needed for nausea or vomiting. 20 tablet 0   promethazine  (PHENERGAN ) 12.5 MG tablet Take 12.5 mg by mouth every 6 (six) hours as needed.     telmisartan  (MICARDIS ) 80 MG tablet Take 1 tablet by  mouth once daily 90 tablet 1   warfarin (COUMADIN ) 5 MG tablet 1 tablet daily x 5 days per week,  4 mg all other days 30 tablet 0   mirtazapine  (REMERON ) 15 MG tablet 1/2 to 1 tablet 30 minutes before bedtime daily 30 tablet 2   ALPRAZolam  (XANAX ) 0.25 MG tablet TAKE 1 TABLET BY MOUTH AT BEDTIME AS NEEDED FOR ANXIETY (Patient not taking: Reported on 01/13/2023) 30 tablet 2   traZODone  (DESYREL ) 50 MG tablet Take 1 tablet (50 mg total) by mouth at bedtime. (Patient not taking: Reported on 01/13/2023) 90 tablet 1   warfarin (COUMADIN ) 3 MG tablet Take 3 mg by mouth daily. (Patient not taking: Reported on 01/13/2023)     warfarin (COUMADIN ) 4 MG tablet 1 tablet 2 days per week,  5 mg all other days (Patient not taking: Reported on 01/13/2023) 30  tablet 0   Facility-Administered Medications Prior to Visit  Medication Dose Route Frequency Provider Last Rate Last Admin   cyanocobalamin  ((VITAMIN B-12)) injection 1,000 mcg  1,000 mcg Intramuscular Once Lamont Tant L, MD        Review of Systems;  Patient denies headache, fevers, malaise, unintentional weight loss, skin rash, eye pain, sinus congestion and sinus pain, sore throat, dysphagia,  hemoptysis , cough, dyspnea, wheezing, chest pain, palpitations, orthopnea, edema, abdominal pain, nausea, melena, diarrhea, constipation, flank pain, dysuria, hematuria, urinary  Frequency, nocturia, numbness, tingling, seizures,  Focal weakness, Loss of consciousness,  Tremor, insomnia, depression, anxiety, and suicidal ideation.      Objective:  BP (!) 118/58   Pulse 85   Ht 5' 1 (1.549 m)   Wt 157 lb 6.4 oz (71.4 kg)   SpO2 96%   BMI 29.74 kg/m   BP Readings from Last 3 Encounters:  01/13/23 (!) 118/58  12/01/22 128/76  11/19/22 124/65    Wt Readings from Last 3 Encounters:  01/13/23 157 lb 6.4 oz (71.4 kg)  12/01/22 158 lb 3.2 oz (71.8 kg)  11/07/22 158 lb 9.6 oz (71.9 kg)    Physical Exam Vitals reviewed.  Constitutional:      General: She is not in acute distress.    Appearance: Normal appearance. She is normal weight. She is not ill-appearing, toxic-appearing or diaphoretic.  HENT:     Head: Normocephalic.  Eyes:     General: No scleral icterus.       Right eye: No discharge.        Left eye: No discharge.     Conjunctiva/sclera: Conjunctivae normal.  Cardiovascular:     Rate and Rhythm: Normal rate and regular rhythm.     Heart sounds: Normal heart sounds.  Pulmonary:     Effort: Pulmonary effort is normal. No respiratory distress.     Breath sounds: Normal breath sounds.  Musculoskeletal:        General: Normal range of motion.  Skin:    General: Skin is warm and dry.  Neurological:     General: No focal deficit present.     Mental Status: She is alert  and oriented to person, place, and time. Mental status is at baseline.  Psychiatric:        Mood and Affect: Mood normal.        Behavior: Behavior normal.        Thought Content: Thought content normal.        Judgment: Judgment normal.    Lab Results  Component Value Date   HGBA1C 6.1 02/04/2022   HGBA1C  6.1 10/28/2021   HGBA1C 5.9 (H) 02/15/2021    Lab Results  Component Value Date   CREATININE 1.00 12/01/2022   CREATININE 1.55 (H) 11/18/2022   CREATININE 0.99 02/04/2022    Lab Results  Component Value Date   WBC 3.4 (L) 11/18/2022   HGB 14.5 11/18/2022   HCT 43.2 11/18/2022   PLT 175 11/18/2022   GLUCOSE 102 (H) 12/01/2022   CHOL 137 02/04/2022   TRIG 120.0 02/04/2022   HDL 49.90 02/04/2022   LDLDIRECT 111.0 08/13/2016   LDLCALC 63 02/04/2022   ALT 28 11/18/2022   AST 35 11/18/2022   NA 142 12/01/2022   K 4.2 12/01/2022   CL 104 12/01/2022   CREATININE 1.00 12/01/2022   BUN 14 12/01/2022   CO2 31 12/01/2022   TSH 2.16 12/02/2022   INR 2.5 (H) 01/08/2023   HGBA1C 6.1 02/04/2022   MICROALBUR 0.8 11/03/2022    CT ABDOMEN PELVIS W CONTRAST Result Date: 11/18/2022 CLINICAL DATA:  Severe abdominal pain most prominent periumbilical. Nausea with vomiting. History of ischemic colitis and colectomy. EXAM: CT ABDOMEN AND PELVIS WITH CONTRAST TECHNIQUE: Multidetector CT imaging of the abdomen and pelvis was performed using the standard protocol following bolus administration of intravenous contrast. RADIATION DOSE REDUCTION: This exam was performed according to the departmental dose-optimization program which includes automated exposure control, adjustment of the mA and/or kV according to patient size and/or use of iterative reconstruction technique. CONTRAST:  75mL OMNIPAQUE  IOHEXOL  300 MG/ML  SOLN COMPARISON:  CT abdomen and pelvis 08/06/2021 FINDINGS: Lower chest: No acute abnormality. Hepatobiliary: No focal liver abnormality is seen. No gallstones, gallbladder wall  thickening, or biliary dilatation. Pancreas: Unremarkable. Spleen: Cyst or hemangioma in the spleen.  Otherwise unremarkable. Adrenals/Urinary Tract: Normal adrenal glands. Scarring in the lower pole of the left kidney. No urinary calculi or hydronephrosis. Unremarkable bladder. Stomach/Bowel: Postoperative change of partial colectomy with anastomosis in the left lower quadrant. Adjacent calcified fat necrosis. No bowel wall thickening or obstruction. The appendix is not visualized. Stomach is within normal limits. Vascular/Lymphatic: Aortic atherosclerosis. No enlarged abdominal or pelvic lymph nodes. Reproductive: Hysterectomy.  No adnexal mass. Other: No free intraperitoneal fluid or air. Musculoskeletal: No acute fracture. IMPRESSION: 1. No acute abnormality in the abdomen or pelvis. 2. Postoperative change of partial colectomy with anastomosis in the left lower quadrant. No bowel obstruction. Aortic Atherosclerosis (ICD10-I70.0). Electronically Signed   By: Norman Gatlin M.D.   On: 11/18/2022 23:47    Assessment & Plan:  .Controlled type 2 diabetes mellitus with microalbuminuria, without long-term current use of insulin (HCC)  Hyperlipidemia associated with type 2 diabetes mellitus (HCC)  Hypertension, unspecified type Assessment & Plan: She has required a 4th agent to lower BP.  Screening for secondary causes were done and negative   Microalbuminuria due to type 2 diabetes mellitus [facility]) Assessment & Plan: Resolved with use of  telmisartan   Lab Results  Component Value Date   LABMICR 26.1 05/14/2020   LABMICR See below: 02/15/2020   MICROALBUR 0.8 11/03/2022   MICROALBUR 2.2 (H) 11/13/2017        Grief reaction with prolonged bereavement Assessment & Plan: She notes  improvement with addition of abilify  to wellbutrin  .    Did not start mirtazipine    Aortic arch atherosclerosis (HCC) Assessment & Plan: .Reviewed findings of prior CT scan today..  Patient is tolerating high  potency statin therapy with atorvastatin  40 mg daily    Anticoagulation monitoring, INR range 2-3 Assessment & Plan: Valere is  maintained in therapeutic range on 5 g coumadin  daily   Lab Results  Component Value Date   INR 2.5 (H) 01/08/2023   INR 1.7 (H) 12/22/2022   INR 1.3 (H) 12/03/2022   PROTIME 57.9 (A) 02/17/2014         I provided 30 minutes of face-to-face time during this encounter reviewing patient's last visit with me, patient's  most recent visit with cardiology,  nephrology,  and neurology,  recent surgical and non surgical procedures, previous  labs and imaging studies, counseling on currently addressed issues,  and post visit ordering to diagnostics and therapeutics .   Follow-up: Return in about 6 months (around 07/13/2023).   Verneita LITTIE Kettering, MD

## 2023-01-13 NOTE — Patient Instructions (Signed)
.  [facility] Lab Results  Component Value Date   HGBA1C 6.1 02/04/2022

## 2023-01-13 NOTE — Assessment & Plan Note (Signed)
 She has required a 4th agent to lower BP.  Screening for secondary causes were done and negative

## 2023-01-13 NOTE — Assessment & Plan Note (Signed)
 She notes  improvement with addition of abilify to wellbutrin .    Did not start mirtazipine

## 2023-01-13 NOTE — Assessment & Plan Note (Signed)
 Resolved with use of  telmisartan  Lab Results  Component Value Date   LABMICR 26.1 05/14/2020   LABMICR See below: 02/15/2020   MICROALBUR 0.8 11/03/2022   MICROALBUR 2.2 (H) 11/13/2017

## 2023-01-19 ENCOUNTER — Encounter: Payer: Self-pay | Admitting: Cardiology

## 2023-01-19 ENCOUNTER — Ambulatory Visit: Payer: PPO | Attending: Cardiology | Admitting: Cardiology

## 2023-01-19 VITALS — BP 112/60 | HR 84 | Ht 61.0 in | Wt 164.6 lb

## 2023-01-19 DIAGNOSIS — R009 Unspecified abnormalities of heart beat: Secondary | ICD-10-CM | POA: Diagnosis not present

## 2023-01-19 DIAGNOSIS — I10 Essential (primary) hypertension: Secondary | ICD-10-CM

## 2023-01-19 DIAGNOSIS — E7800 Pure hypercholesterolemia, unspecified: Secondary | ICD-10-CM | POA: Diagnosis not present
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
## 2023-01-19 NOTE — Patient Instructions (Signed)
 Medication Instructions:   Your physician has recommended you make the following change in your medication:   *If you need a refill on your cardiac medications before your next appointment, please call your pharmacy*   Lab Work:  No labs ordered today  If you have labs (blood work) drawn today and your tests are completely normal, you will receive your results only by: MyChart Message (if you have MyChart) OR A paper copy in the mail If you have any lab test that is abnormal or we need to change your treatment, we will call you to review the results.   Testing/Procedures:  No test ordered today    Follow-Up:  At [facility], you and your health needs are our priority.  As part of our continuing mission to provide you with exceptional heart care, we have created designated Provider Care Teams.  These Care Teams include your primary Cardiologist (physician) and Advanced Practice Providers (APPs -  Physician Assistants and Nurse Practitioners) who all work together to provide you with the care you need, when you need it.  Your next appointment:   1 year(s)  Provider:   Redell Cave, MD         Your physician

## 2023-01-27 ENCOUNTER — Other Ambulatory Visit (INDEPENDENT_AMBULATORY_CARE_PROVIDER_SITE_OTHER): Payer: PPO

## 2023-01-27 ENCOUNTER — Encounter: Payer: Self-pay | Admitting: Internal Medicine

## 2023-01-27 DIAGNOSIS — Z7901 Long term (current) use of anticoagulants: Secondary | ICD-10-CM | POA: Diagnosis not present

## 2023-01-27 LAB — PROTIME-INR
INR: 3.3 {ratio} — ABNORMAL HIGH (ref 0.8–1.0)
Prothrombin Time: 32.9 s — ABNORMAL HIGH (ref 9.6–13.1)

## 2023-01-28 MED ORDER — WARFARIN SODIUM 4 MG PO TABS: 4.0000 mg | ORAL_TABLET | Freq: Every day | ORAL | 2 refills | Status: DC

## 2023-01-28 NOTE — Assessment & Plan Note (Signed)
INR elevated on 5 mg daily dose.  Reducing weekly dose from 35 mg to 32 mg ( 5 mg alt with 4 mg ) repeat inR 2 weeks   Lab Results  Component Value Date   INR 3.3 (H) 01/27/2023   INR 2.5 (H) 01/08/2023   INR 1.7 (H) 12/22/2022   PROTIME 57.9 (A) 02/17/2014

## 2023-01-28 NOTE — Addendum Note (Signed)
Addended by: Sherlene Shams on: 01/28/2023 12:33 PM   Modules accepted: Orders

## 2023-01-29 ENCOUNTER — Telehealth: Payer: Self-pay

## 2023-01-29 DIAGNOSIS — Z7901 Long term (current) use of anticoagulants: Secondary | ICD-10-CM

## 2023-01-29 NOTE — Telephone Encounter (Signed)
Lab order placed for lab appt.  °

## 2023-02-12 ENCOUNTER — Telehealth: Payer: Self-pay | Admitting: *Deleted

## 2023-02-12 ENCOUNTER — Encounter: Payer: Self-pay | Admitting: Internal Medicine

## 2023-02-12 ENCOUNTER — Other Ambulatory Visit (INDEPENDENT_AMBULATORY_CARE_PROVIDER_SITE_OTHER): Payer: PPO

## 2023-02-12 ENCOUNTER — Ambulatory Visit: Payer: Self-pay | Admitting: Internal Medicine

## 2023-02-12 DIAGNOSIS — Z7901 Long term (current) use of anticoagulants: Secondary | ICD-10-CM | POA: Diagnosis not present

## 2023-02-12 LAB — PROTIME-INR
INR: 2.2 {ratio} — ABNORMAL HIGH (ref 0.8–1.0)
Prothrombin Time: 22.9 s — ABNORMAL HIGH (ref 9.6–13.1)

## 2023-02-12 NOTE — Telephone Encounter (Signed)
 Chief Complaint: Right fingers numbness, tingling, burning Symptoms: see above Frequency: > 1 month Pertinent Negatives: Patient denies cardiac symptoms Disposition: [] ED /[] Urgent Care (no appt availability in office) / [x] Appointment(In office/virtual)/ []  [facility]/ [] Home Care/ [] Refused Recommended Disposition /[] [facility]/ []  Follow-up with PCP Additional Notes: Patient called in stating she is having worsening symptoms that have previously been reported to PCP regarding her numbness, tingling, and burning of the thumb, pointer finger and middle finger on her right hand. Patient states it is constant throughout the day and worse at night. She also has tenderness in her right arm from her hand to her elbow. Patient states she has to be careful with what she picks up notices decreased dexterity in her right hand/fingers. Patient was seen by a neurologist at [facility] in December and was placed on Lyrica . Patient states Lyrica  has not helped at all. Patient states she has diabetes listed as a medical condition on her chart but is not on any type of gabapentin or nerve medication for potential neuropathy. Patient states that [facility] has ordered an MRI on her for the 11th for brain and spine due to recent hospitalization in December for Migraine and patient is unsure if this will show anything in relation to her neurologic symptoms with her right hand. Patient appointment created with Dr. Tullo for follow up and patient placed on the wait list in hopes of moving to earlier appointment.    Copied from CRM [id number]. Topic: Clinical - Red Word Triage >> Feb 12, 2023  4:54 PM Viola F wrote: Red Word that prompted transfer to Nurse Triage: Over the last month patient right hand/arm has experienced numbness that continues to increase. It seems to be more prominent in the evening & night hours. The pain wakes patient up during the night constantly. It starts with a feeling like a very  tight B/P cuff advancing to needles, numbness, pain & swelling. OTC pain meds/creams give no relief. Answer Assessment - Initial Assessment Questions 1. SYMPTOM: What is the main symptom you are concerned about? (e.g., weakness, numbness)     Numbness, tingling, burning in right thumb, pointer finger, middle finger and tenderness in right arm from hand up to elbow 2. ONSET: When did this start? (minutes, hours, days; while sleeping)     > 1 month ago 3. LAST NORMAL: When was the last time you (the patient) were normal (no symptoms)?     > 1 month ago 4. PATTERN Does this come and go, or has it been constant since it started?  Is it present now?     Stays constant, worse at night 5. CARDIAC SYMPTOMS: Have you had any of the following symptoms: chest pain, difficulty breathing, palpitations?     No 6. NEUROLOGIC SYMPTOMS: Have you had any of the following symptoms: headache, dizziness, vision loss, double vision, changes in speech, unsteady on your feet?     Has had one migraine since being hospitalized in December 7. OTHER SYMPTOMS: Do you have any other symptoms?     Decreased dexterity  Protocols used: Neurologic Deficit-A-AH

## 2023-02-12 NOTE — Telephone Encounter (Signed)
 Patient needs triage left this message in my chart  pasted below please triage.   Over the last month my right hand/arm has experienced numbness that continues to increase. It seems to be more prominent in the evening & night hours. The pain wakes me during the night constantly. It starts with a feeling like a very tight B/P cuff advancing to needles, numbness, pain & swelling. OTC pain meds/creams give no relief. I have an upcoming brain/spine mri on 2/11. Would that show if issue is pinched nerve or neuropathy? If so, should I bring this to neurologist's attention or is this something you can help with?  I am taking pregabulin which neurologist prescribed late 2024. Although pain/discomfort is constant the night time is really, really painful. Please give me direction on this issue. Thank you so much!

## 2023-02-12 NOTE — Telephone Encounter (Signed)
 Spoke with pt and and she stated that this is an on going issue for about 5 weeks. She stated that she has an MRI scheduled was wanting to know if a pinched nerve would show up on the MRI incase that is what is causing the tingling and numbness in her arm. Pt was scheduled for 02/20/2023 and she stated that she is fine with waiting till then unless you flet that she needed to be seen sooner.

## 2023-02-12 NOTE — Telephone Encounter (Signed)
 Needs triage no answer. Left message to call office.

## 2023-02-13 NOTE — Telephone Encounter (Signed)
 Triage in different encounter.

## 2023-02-13 NOTE — Telephone Encounter (Signed)
 Lockie Rima, RN    02/13/23  4:39 PM Note Triage in different encounter.

## 2023-02-15 ENCOUNTER — Encounter: Payer: Self-pay | Admitting: Internal Medicine

## 2023-02-15 DIAGNOSIS — Z7901 Long term (current) use of anticoagulants: Secondary | ICD-10-CM

## 2023-02-16 DIAGNOSIS — M1712 Unilateral primary osteoarthritis, left knee: Secondary | ICD-10-CM | POA: Diagnosis not present

## 2023-02-16 DIAGNOSIS — M25562 Pain in left knee: Secondary | ICD-10-CM | POA: Diagnosis not present

## 2023-02-16 NOTE — Assessment & Plan Note (Signed)
 INR is therapeutic on regimen  of  4 mg alt with 5 mg daily   Lab Results  Component Value Date   INR 2.2 (H) 02/12/2023   INR 3.3 (H) 01/27/2023   INR 2.5 (H) 01/08/2023   PROTIME 57.9 (A) 02/17/2014

## 2023-02-16 NOTE — Telephone Encounter (Signed)
 LMTCB

## 2023-02-16 NOTE — Telephone Encounter (Deleted)
 Copied from CRM [id number]. Topic: Clinical - Lab/Test Results >> Feb 16, 2023  3:23 PM [name] wrote: Reason for CRM: Patient returning phone call from Camilo Cella in regard to results - patient states she saw the message on MyChart but wasn't sure if there was any other information she needed to know.

## 2023-02-16 NOTE — Telephone Encounter (Signed)
 Copied from CRM [id number]. Topic: Clinical - Lab/Test Results >> Feb 16, 2023  3:23 PM [name] wrote: Reason for CRM: Patient returning phone call from Camilo Cella in regard to results - patient states she saw the message on MyChart but wasn't sure if there was any other information she needed to know.

## 2023-02-17 DIAGNOSIS — M47812 Spondylosis without myelopathy or radiculopathy, cervical region: Secondary | ICD-10-CM | POA: Diagnosis not present

## 2023-02-17 DIAGNOSIS — G43009 Migraine without aura, not intractable, without status migrainosus: Secondary | ICD-10-CM | POA: Diagnosis not present

## 2023-02-17 DIAGNOSIS — M4802 Spinal stenosis, cervical region: Secondary | ICD-10-CM | POA: Diagnosis not present

## 2023-02-17 DIAGNOSIS — I6782 Cerebral ischemia: Secondary | ICD-10-CM | POA: Diagnosis not present

## 2023-02-17 DIAGNOSIS — M9971 Connective tissue and disc stenosis of intervertebral foramina of cervical region: Secondary | ICD-10-CM | POA: Diagnosis not present

## 2023-02-17 NOTE — Telephone Encounter (Signed)
See triage encounter.

## 2023-02-19 ENCOUNTER — Telehealth: Payer: Self-pay | Admitting: Internal Medicine

## 2023-02-19 NOTE — Telephone Encounter (Signed)
Your appointment on 02/19/2022 will be a virtual with Dr Darrick Huntsman. Dr Darrick Huntsman has been working from home this week. If you are unable to do a virtual please call the office at [phone] to reschedule.  Thank you.

## 2023-02-19 NOTE — Telephone Encounter (Signed)
Pt is okay with the virtual visit.

## 2023-02-20 ENCOUNTER — Telehealth (INDEPENDENT_AMBULATORY_CARE_PROVIDER_SITE_OTHER): Payer: PPO | Admitting: Internal Medicine

## 2023-02-20 ENCOUNTER — Encounter: Payer: Self-pay | Admitting: Internal Medicine

## 2023-02-20 ENCOUNTER — Other Ambulatory Visit: Payer: Self-pay

## 2023-02-20 VITALS — Ht 61.0 in | Wt 164.0 lb

## 2023-02-20 DIAGNOSIS — R29898 Other symptoms and signs involving the musculoskeletal system: Secondary | ICD-10-CM

## 2023-02-20 DIAGNOSIS — E785 Hyperlipidemia, unspecified: Secondary | ICD-10-CM

## 2023-02-20 DIAGNOSIS — I10 Essential (primary) hypertension: Secondary | ICD-10-CM

## 2023-02-20 DIAGNOSIS — I1A0 Resistant hypertension: Secondary | ICD-10-CM

## 2023-02-20 DIAGNOSIS — M542 Cervicalgia: Secondary | ICD-10-CM | POA: Diagnosis not present

## 2023-02-20 DIAGNOSIS — M4712 Other spondylosis with myelopathy, cervical region: Secondary | ICD-10-CM

## 2023-02-20 DIAGNOSIS — E1129 Type 2 diabetes mellitus with other diabetic kidney complication: Secondary | ICD-10-CM

## 2023-02-20 DIAGNOSIS — E1169 Type 2 diabetes mellitus with other specified complication: Secondary | ICD-10-CM | POA: Diagnosis not present

## 2023-02-20 MED ORDER — AMLODIPINE BESYLATE 5 MG PO TABS: 5.0000 mg | ORAL_TABLET | Freq: Every day | ORAL | 1 refills | Status: DC

## 2023-02-20 MED ORDER — BUPROPION HCL ER (XL) 150 MG PO TB24: 150.0000 mg | ORAL_TABLET | Freq: Every day | ORAL | 1 refills | Status: DC

## 2023-02-20 MED ORDER — HYDROCHLOROTHIAZIDE 25 MG PO TABS: 25.0000 mg | ORAL_TABLET | Freq: Every day | ORAL | 3 refills | Status: DC

## 2023-02-20 MED ORDER — ATORVASTATIN CALCIUM 40 MG PO TABS: 40.0000 mg | ORAL_TABLET | Freq: Every day | ORAL | 3 refills | Status: DC

## 2023-02-20 MED ORDER — PREGABALIN 75 MG PO CAPS
75.0000 mg | ORAL_CAPSULE | Freq: Two times a day (BID) | ORAL | 1 refills | Status: DC
Start: 2023-02-20 — End: 2023-07-07

## 2023-02-20 NOTE — Progress Notes (Signed)
Virtual Visit via Caregility   Note   This format is felt to be most appropriate for this patient at this time.  All issues noted in this document were discussed and addressed.  No physical exam was performed (except for noted visual exam findings with Video Visits).   I connected with [name]  on 02/21/23 at  2:30 PM EST by a video enabled telemedicine application  and verified that I am speaking with the correct person using two identifiers. Location patient: home Location provider: work or home office Persons participating in the virtual visit: patient, provider  I discussed the limitations, risks, security and privacy concerns of performing an evaluation and management service by telephone and the availability of in person appointments. I also discussed with the patient that there may be a patient responsible charge related to this service. The patient expressed understanding and agreed to proceed.  Reason for visit: RUE numbness and tingling  HPI:   Several moth history of burning,  tingling sensation n right thumb which over time has spread to include index/middle finger..  symptoms are more noticeable  at night,  and she notcies that her hand looksand feels swollen at night as well.  Over the last 6 weeks  she has lost strength in the right hand and has started  dropping s .  Recently had a cervical spine and brain MRI   ordered by [facility] to evaluate new onset headache    MRI Cervical spine  noted  DDD at multiple  levels with osteophyte formation causing  moderate right foraminal narrowing at the C5-6 level .   Lyrica prescribed ,  titrated up to 50 mg bid with no change in pain symptoms.    ROS: See pertinent positives and negatives per HPI.  Past Medical History:  Diagnosis Date   B12 deficiency    Chronic ischemic colitis, enteritis, or enterocolitis (HCC) 04/08/2011   S/p right colectomy May 2013, [place]   Clotting disorder [facility])    Depression    Diabetes mellitus without  complication (HCC)    pre diabetic   Fire ant bite 05/15/2020   GERD (gastroesophageal reflux disease)    H/O toxic shock syndrome 2001   post urologic procedure for stone removal   Headache    migraines   History of pulmonary embolus (PE) Sept 2011   secondary to Protein c& S deficiency, Lupus ACA   HOH (hard of hearing)    wears hearing aids   Hyperlipidemia    Hypertension    Ischemic colitis (HCC) 2012-2013   s/p left colectomy May 2013   Ischemic colitis (HCC)    Kidney stones    PONV (postoperative nausea and vomiting)     Past Surgical History:  Procedure Laterality Date   ABDOMINAL HYSTERECTOMY  1996   APPENDECTOMY     bilateral tubal ligation     BREAST EXCISIONAL BIOPSY Left 1989   benign   BREAST SURGERY  1989   left biopsy, normal   CATARACT EXTRACTION W/PHACO Right 05/30/2019   Procedure: CATARACT EXTRACTION PHACO AND INTRAOCULAR LENS PLACEMENT (IOC) RIGHT DIABETIC;  Surgeon: Nevada Crane, MD;  Location: [facility];  Service: Ophthalmology;  Laterality: Right;  3.84 0:37.1   CATARACT EXTRACTION W/PHACO Left 06/20/2019   Procedure: CATARACT EXTRACTION PHACO AND INTRAOCULAR LENS PLACEMENT (IOC) LEFT DIABETIC 2.58  00:29.3;  Surgeon: Nevada Crane, MD;  Location: [facility];  Service: Ophthalmology;  Laterality: Left;  Diabetic   COLECTOMY Left May  2013   [place], ischemic colitis   COLONOSCOPY WITH PROPOFOL N/A 11/16/2014   Procedure: COLONOSCOPY WITH PROPOFOL;  Surgeon: Scot Jun, MD;  Location: [facility] END[facility]COPY;  Service: Endoscopy;  Laterality: N/A;   COLONOSCOPY WITH PROPOFOL N/A 07/12/2021   Procedure: COLONOSCOPY WITH PROPOFOL;  Surgeon: Regis Bill, MD;  Location: [facility] ENDOSCOPY;  Service: Endoscopy;  Laterality: N/A;   ESOPHAGOGASTRODUODENOSCOPY (EGD) WITH PROPOFOL N/A 11/16/2014   Procedure: ESOPHAGOGASTRODUODENOSCOPY (EGD) WITH PROPOFOL;  Surgeon: Scot Jun, MD;  Location: [facility] ENDOSCOPY;  Service: Endoscopy;   Laterality: N/A;   ESOPHAGOGASTRODUODENOSCOPY (EGD) WITH PROPOFOL N/A 07/12/2021   Procedure: ESOPHAGOGASTRODUODENOSCOPY (EGD) WITH PROPOFOL;  Surgeon: Regis Bill, MD;  Location: [facility] ENDOSCOPY;  Service: Endoscopy;  Laterality: N/A;   SPINE SURGERY  1984   lumbar diskectomy, [place]   ureterolithotomy     Dr. Artis Flock urology ~ 2012 kidney stones in ICU after surgery    Family History  Problem Relation Age of Onset   Hypertension Mother    Cancer Mother    Diabetes Mother    Congestive Heart Failure Mother    Heart disease Father    Hypertension Father    Diabetes Father    Alzheimer's disease Sister    Breast cancer Sister 34       DCIS   Breast cancer Paternal Aunt        <50   Breast cancer Cousin     SOCIAL HX:  reports that she has never smoked. She has never used smokeless tobacco. She reports that she does not drink alcohol and does not use drugs.    Current Outpatient Medications:    ARIPiprazole (ABILIFY) 2 MG tablet, Take 2 mg by mouth daily., Disp: , Rfl:    aspirin 81 MG tablet, Take 81 mg by mouth daily., Disp: , Rfl:    Cholecalciferol (VITAMIN D3) 1000 UNITS CAPS, Take by mouth., Disp: , Rfl:    clonazePAM (KLONOPIN) 1 MG tablet, Take 1 mg by mouth at bedtime as needed., Disp: , Rfl:    cyanocobalamin (VITAMIN B12) 1000 MCG/ML injection, INJECT INTO THE MUSCLE EVERY 30 DAYS, Disp: 10 mL, Rfl: 0   diphenhydrAMINE (BENADRYL) 25 mg capsule, Take 25 mg by mouth every 8 (eight) hours as needed., Disp: , Rfl:    furosemide (LASIX) 20 MG tablet, Take 1 tablet (20 mg total) by mouth 2 (two) times a week. Tuesday and Friday, Disp: 30 tablet, Rfl: 1   methocarbamol (ROBAXIN) 500 MG tablet, Take 500 mg by mouth at bedtime., Disp: , Rfl:    mirtazapine (REMERON) 15 MG tablet, Take 7.5-15 mg by mouth at bedtime., Disp: , Rfl:    Omega-3 Fatty Acids (FISH OIL) 1000 MG CAPS, Take 1 capsule daily by mouth., Disp: , Rfl:    omeprazole (PRILOSEC) 20 MG capsule, Take 1  capsule (20 mg total) by mouth daily., Disp: , Rfl:    pregabalin (LYRICA) 75 MG capsule, Take 1 capsule (75 mg total) by mouth 2 (two) times daily., Disp: 60 capsule, Rfl: 1   telmisartan (MICARDIS) 80 MG tablet, Take 1 tablet (80 mg total) by mouth daily., Disp: 90 tablet, Rfl: 1   warfarin (COUMADIN) 4 MG tablet, Take 1 tablet (4 mg total) by mouth daily. Alternating with 5 mg daily, Disp: 30 tablet, Rfl: 2   warfarin (COUMADIN) 5 MG tablet, 1 tablet daily x 5 days per week,  4 mg all other days (Patient taking differently: Alternating with 4 mg), Disp: 30 tablet,  Rfl: 0   amLODipine (NORVASC) 5 MG tablet, Take 1 tablet (5 mg total) by mouth daily., Disp: 90 tablet, Rfl: 1   atorvastatin (LIPITOR) 40 MG tablet, Take 1 tablet (40 mg total) by mouth daily., Disp: 90 tablet, Rfl: 3   buPROPion (WELLBUTRIN XL) 150 MG 24 hr tablet, Take 1 tablet (150 mg total) by mouth daily., Disp: 90 tablet, Rfl: 1   hydrochlorothiazide (HYDRODIURIL) 25 MG tablet, Take 1 tablet (25 mg total) by mouth daily., Disp: 90 tablet, Rfl: 3   ondansetron (ZOFRAN) 4 MG tablet, Take 1 tablet (4 mg total) by mouth every 8 (eight) hours as needed for nausea or vomiting. (Patient not taking: Reported on 02/20/2023), Disp: 20 tablet, Rfl: 0   promethazine (PHENERGAN) 12.5 MG tablet, Take 12.5 mg by mouth every 6 (six) hours as needed. (Patient not taking: Reported on 02/20/2023), Disp: , Rfl:   Current Facility-Administered Medications:    cyanocobalamin ((VITAMIN B-12)) injection 1,000 mcg, 1,000 mcg, Intramuscular, Once, Darrick Huntsman, Mar Daring, MD  EXAM:  VITALS per patient if applicable:  GENERAL: alert, oriented, appears well and in no acute distress  HEENT: atraumatic, conjunttiva clear, no obvious abnormalities on inspection of external nose and ears  NECK: normal movements of the head and neck  LUNGS: on inspection no signs of respiratory distress, breathing rate appears normal, no obvious gross SOB, gasping or  wheezing  CV: no obvious cyanosis  MS: moves all visible extremities without noticeable abnormality  PSYCH/NEURO: pleasant and cooperative, no obvious depression or anxiety, speech and thought processing grossly intact  ASSESSMENT AND PLAN: Right hand weakness -     Ambulatory referral to Neurosurgery  Hypertension, unspecified type -     amLODIPine Besylate; Take 1 tablet (5 mg total) by mouth daily.  Dispense: 90 tablet; Refill: 1  Hyperlipidemia associated with type 2 diabetes mellitus (HCC) -     Atorvastatin Calcium; Take 1 tablet (40 mg total) by mouth daily.  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 Referring to neurosurgery   Orders: -     Ambulatory referral to Neurosurgery  Cervicalgia of occipito-atlanto-axial region Assessment & Plan: Ausing daily occipital headacehs  advised to sue cervical support pillows.  Referraing to neurosurgery for evaluation given iew onset weakness of right hand    Other orders -     buPROPion HCl ER (XL); Take 1 tablet (150 mg total) by mouth daily.  Dispense: 90 tablet; Refill: 1 -     hydroCHLOROthiazide; Take 1 tablet (25 mg total) by mouth daily.  Dispense: 90 tablet; Refill: 3 -     Pregabalin; Take 1 capsule (75 mg total) by mouth 2 (two) times daily.  Dispense: 60 capsule; Refill: 1      I discussed the assessment and treatment plan with the patient. The patient was provided an opportunity to ask questions and all were answered. The patient agreed with the plan and demonstrated an understanding of the instructions.   The patient was advised to call back or seek an in-person evaluation if the symptoms worsen or if the condition fails to improve as anticipated.   I spent 30 minutes dedicated to the care of this patient on the date of this encounter to include pre-visit review of  recent MRI cervical  spine and brain ,  past  medical history,  Face-to-face time with the patient , and post visit ordering  of testing and therapeutics.    Sherlene Shams, MD

## 2023-02-20 NOTE — Patient Instructions (Addendum)
Ok to take 600 mg ibuprofen for your headache,   plus 1000 mg tylenol every 12 hours if needed    Ok to  add robaxin   Use a cervical support pillow  I am referring you to Dr Myer Haff (neurosurgeon in [place],  with [facility])  to review MRI, order nerve conduction  studies

## 2023-02-21 DIAGNOSIS — M4712 Other spondylosis with myelopathy, cervical region: Secondary | ICD-10-CM | POA: Insufficient documentation

## 2023-02-21 NOTE — Assessment & Plan Note (Signed)
Secondary to osteophyte formation causing right foraminal stenosis at C5-6 evel . Referring to neurosurgery

## 2023-02-21 NOTE — Assessment & Plan Note (Signed)
Ausing daily occipital headacehs  advised to sue cervical support pillows.  Referraing to neurosurgery for evaluation given iew onset weakness of right hand

## 2023-02-26 ENCOUNTER — Inpatient Hospital Stay
Admission: RE | Admit: 2023-02-26 | Discharge: 2023-02-26 | Disposition: A | Discharge status: Home / Self Care | Payer: Self-pay | Source: Ambulatory Visit | Attending: Neurosurgery | Admitting: Neurosurgery

## 2023-02-26 ENCOUNTER — Other Ambulatory Visit: Payer: Self-pay | Admitting: Family Medicine

## 2023-02-26 DIAGNOSIS — Z049 Encounter for examination and observation for unspecified reason: Secondary | ICD-10-CM
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
## 2023-03-02 ENCOUNTER — Ambulatory Visit: Payer: PPO | Admitting: Neurosurgery

## 2023-03-02 VITALS — BP 110/58 | Ht 61.0 in | Wt 157.0 lb

## 2023-03-02 DIAGNOSIS — G5601 Carpal tunnel syndrome, right upper limb: Secondary | ICD-10-CM

## 2023-03-02 NOTE — Patient Instructions (Signed)
 It was a pleasure seeing you today at [facility] neurosurgery in [place].  We discussed your right upper extremity symptoms.  These are most consistent with carpal tunnel syndrome.  This is where a nerve is compressed at your wrist.  In order to verify that diagnosis we will have you see our neurology team who will perform an ultrasound and a nerve test on your wrist to see for any enlargement of your nerve.  If you do in fact have carpal tunnel syndrome given your weakness would be able to plan a decompression.  After you get the test done please reach out to [place] and we will plan on scheduling.  Should this be a different location like coming from your neck and your previous arthritis then we will discuss an alternative plan.

## 2023-03-05 ENCOUNTER — Other Ambulatory Visit: Payer: Self-pay

## 2023-03-05 DIAGNOSIS — G5601 Carpal tunnel syndrome, right upper limb: Secondary | ICD-10-CM

## 2023-03-06 ENCOUNTER — Encounter: Payer: Self-pay | Admitting: Neurology

## 2023-03-06 ENCOUNTER — Other Ambulatory Visit: Payer: Self-pay

## 2023-03-06 DIAGNOSIS — R202 Paresthesia of skin: Secondary | ICD-10-CM

## 2023-03-09 ENCOUNTER — Telehealth: Payer: Self-pay

## 2023-03-09 NOTE — Telephone Encounter (Signed)
 No PA is required for the NMUS.

## 2023-03-19 DIAGNOSIS — G43009 Migraine without aura, not intractable, without status migrainosus: Secondary | ICD-10-CM | POA: Diagnosis not present

## 2023-03-23 ENCOUNTER — Other Ambulatory Visit: Payer: Self-pay | Admitting: Internal Medicine

## 2023-03-23 ENCOUNTER — Other Ambulatory Visit (INDEPENDENT_AMBULATORY_CARE_PROVIDER_SITE_OTHER)

## 2023-03-23 DIAGNOSIS — Z7901 Long term (current) use of anticoagulants: Secondary | ICD-10-CM

## 2023-03-24 ENCOUNTER — Encounter: Payer: Self-pay | Admitting: Internal Medicine

## 2023-03-24 LAB — PROTIME-INR
INR: 1.6 — ABNORMAL HIGH
Prothrombin Time: 16.9 s — ABNORMAL HIGH (ref 9.0–11.5)

## 2023-03-26 ENCOUNTER — Other Ambulatory Visit: Payer: Self-pay | Admitting: *Deleted

## 2023-03-26 DIAGNOSIS — Z7901 Long term (current) use of anticoagulants: Secondary | ICD-10-CM

## 2023-03-26 NOTE — Assessment & Plan Note (Signed)
 INR is low  on 4 mg alt with 5 mg daily. Advised to take 8 mg tonight and start 5 mg daily on Friday   Lab Results  Component Value Date   INR 1.6 (H) 03/23/2023   INR 2.2 (H) 02/12/2023   INR 3.3 (H) 01/27/2023   PROTIME 57.9 (A) 02/17/2014

## 2023-03-27 ENCOUNTER — Other Ambulatory Visit: Payer: Self-pay | Admitting: Internal Medicine

## 2023-03-27 MED ORDER — WARFARIN SODIUM 5 MG PO TABS: ORAL_TABLET | ORAL | 0 refills | Status: DC

## 2023-03-27 NOTE — Telephone Encounter (Signed)
 Copied from CRM [id number]. Topic: Clinical - Medication Refill >> Mar 27, 2023 11:00 AM Florestine Avers wrote: Most Recent Primary Care Visit:  Provider: [facility]  Department: [facility]  Visit Type: LAB VISIT  Date: 03/23/2023  Medication: warfarin (COUMADIN) 5 MG tablet  Has the patient contacted their pharmacy? Yes (Agent: If no, request that the patient contact the pharmacy for the refill. If patient does not wish to contact the pharmacy document the reason why and proceed with request.) (Agent: If yes, when and what did the pharmacy advise?)  Is this the correct pharmacy for this prescription? Yes If no, delete pharmacy and type the correct one.  This is the patient's preferred pharmacy:  [facility] [address] [place] - [address] [place] [address] Phone: [phone] Fax: [phone]   Has the prescription been filled recently? Yes  Is the patient out of the medication? Yes  Has the patient been seen for an appointment in the last year OR does the patient have an upcoming appointment? Yes  Can we respond through MyChart? Yes  Agent: Please be advised that Rx refills may take up to 3 business days. We ask that you follow-up with your pharmacy.

## 2023-04-02 ENCOUNTER — Other Ambulatory Visit (INDEPENDENT_AMBULATORY_CARE_PROVIDER_SITE_OTHER)

## 2023-04-02 DIAGNOSIS — Z7901 Long term (current) use of anticoagulants: Secondary | ICD-10-CM

## 2023-04-02 LAB — PROTIME-INR
INR: 3 ratio — ABNORMAL HIGH (ref 0.8–1.0)
Prothrombin Time: 30.5 s — ABNORMAL HIGH (ref 9.6–13.1)

## 2023-04-03 ENCOUNTER — Encounter: Payer: Self-pay | Admitting: Internal Medicine

## 2023-04-06 ENCOUNTER — Telehealth (INDEPENDENT_AMBULATORY_CARE_PROVIDER_SITE_OTHER): Payer: Self-pay | Admitting: Neurosurgery

## 2023-04-06 ENCOUNTER — Ambulatory Visit: Payer: PPO | Admitting: Neurology

## 2023-04-06 DIAGNOSIS — G5601 Carpal tunnel syndrome, right upper limb: Secondary | ICD-10-CM

## 2023-04-06 NOTE — Procedures (Signed)
 [facility]  [address], [address]  [place], [place] [address] Tel: [phone] Fax: [phone] Test Date:  04/06/2023  Patient: [name] DOB: 02/26/1950 Physician: Jacquelyne Balint, MD  Sex: Female Height: 5\' 1"  Ref Phys: Ernestine Mcmurray, MD  ID#: [id number]   Technician:    History: This is a 73 year old female with right hand numbness and tingling and weakness.  NCV & EMG Findings: Extensive electrodiagnostic evaluation of the right upper limb shows: Right median sensory response is absent. Right ulnar and radial sensory responses are within normal limits. Right median (APB) motor response shows prolonged distal onset latency (6.1 ms). Right ulnar (ADM) motor response is within normal limits. There is no evidence of active or chronic motor axon loss changes affecting any of the tested muscles on needle examination. Motor unit configuration and recruitment pattern is within normal limits.  Neuromuscular Ultrasound Findings: High frequency (4.0-16.0 MHz) B-mode, nonvascular ultrasound of the right upper limb shows: Cross sectional areas (CSA) of right median (palm to forearm) nerve shows increased CSA at the wrist (13.7 mm2). Wrist to forearm ratio of right median nerve is increased (2.28). No other obvious lesion involving the adjacent bone or tendon is identified. No definite vascular abnormalities.  Impression: This is an abnormal study. The findings are most consistent with the following: Evidence of a right median mononeuropathy at or distal to the wrist, consistent with carpal tunnel syndrome, severe in degree electrically. While there is right median nerve swelling at the wrist, there is no underlying compressive lesion on neuromuscular ultrasound. No electrodiagnostic evidence of a right cervical (C5-T1) motor radiculopathy. Screening studies for right ulnar and radial mononeuropathies are normal.   ___________________________ Jacquelyne Balint,  MD    NCS+ Motor Nerve Results    Latency Amplitude F-Lat Segment Distance CV Comment  Site (ms) Norm (mV) Norm (ms)  (cm) (m/s) Norm   Right Median (APB) Motor  Wrist *6.1  < 4.0 6.6  > 5.0        Elbow 10.8 - 6.3 -  Elbow-Wrist 25 53  > 50   Right Ulnar (ADM) Motor  Wrist 1.70  < 3.1 11.9  > 7.0        Bel elbow 4.8 - 10.2 -  Bel elbow-Wrist 19.5 63  > 50   Ab elbow 6.5 - 9.5 -  Ab elbow-Bel elbow 10 59 -    Sensory Sites    Neg Peak Lat Amplitude (O-P) Segment Distance Velocity Comment  Site (ms) Norm (V) Norm  (cm) (ms)   Right Median Sensory  Wrist-Dig II *NR  < 3.8 *NR  > 10 Wrist-Dig II 13    Right Radial Sensory  Forearm-Wrist 1.95  < 2.8 17  > 10 Forearm-Wrist 10    Right Ulnar Sensory  Wrist-Dig V 2.6  < 3.2 10  > 5 Wrist-Dig V 11     Nerve Measurements   Site Area Segment Area Ratio Mobility Vascularity Comment   mm Norm   Norm     Right Median  Palm 8.4         Wrist *13.7  < 13.0         Forearm 6.0  < 10.7  Wrist - Forearm *2.28  < 1.50       EMG+   Side Muscle Ins.Act Fibs Fasc Recrt Amp Dur Poly Activation Comment  Right FDI Nml Nml Nml Nml Nml Nml Nml Nml N/A  Right EIP Nml Nml Nml Nml Nml  Nml Nml Nml N/A  Right Pronator teres Nml Nml Nml Nml Nml Nml Nml Nml N/A  Right APB Nml Nml Nml Nml Nml Nml Nml Nml N/A  Right Biceps Nml Nml Nml Nml Nml Nml Nml Nml N/A  Right Triceps Nml Nml Nml Nml Nml Nml Nml Nml N/A  Right Deltoid Nml Nml Nml Nml Nml Nml Nml Nml N/A      Waveforms:  Motor      Sensory

## 2023-04-06 NOTE — Telephone Encounter (Signed)
 Patient is calling to let our office know that she has completed her EMG and would like to know what the next step is in her treatment plan. Please advise.

## 2023-04-09 ENCOUNTER — Telehealth: Payer: Self-pay

## 2023-04-09 DIAGNOSIS — G5601 Carpal tunnel syndrome, right upper limb: Secondary | ICD-10-CM | POA: Insufficient documentation

## 2023-04-09 NOTE — Telephone Encounter (Incomplete)
 I called [name] to decide on a date for surgery and discuss further information as noted below.  _______________________________________________________________________________________  Please see below for information in regards to your upcoming surgery:  Planned surgery: Right Wrist Carpal Tunnel Release   Surgery date: *** at [facility]: [address] [place], [place] [address]) - you will find out your arrival time the business day before your surgery.   Pre-op appointment at [facility] Pre-admit Testing: you will receive a call with a date/time for this appointment. If you are scheduled for an in person appointment, Pre-admit Testing is located on the first floor of the [facility] building, [address], [address]. During this appointment, they will advise you which medications you can take the morning of surgery, and which medications you will need to hold for surgery. Labs (such as blood work, EKG) may be done at your pre-op appointment. You are not required to fast for these labs. Should you need to change your pre-op appointment, please call Pre-admit testing at [phone].   Blood thinners:   Warfarin: stop Warfarin 7 days prior, resume 7 days after. We are reaching out to your PCP to clear you to hold this medication and add Aspirin 81 mg.   Surgical clearance: we will send a clearance form to Duncan Dull MD. They may wish to see you in their office prior to signing the clearance form. If so, they may call you to schedule an appointment.  How to contact us:  If you have any questions/concerns before or after surgery, you can reach [place] at [phone], or you can send a mychart message. We can be reached by phone or mychart 8am-4pm, Monday-Friday.  *Please note: Calls after 4pm are forwarded to a third party answering service. Mychart messages are not routinely monitored during evenings, weekends, and holidays. Please call  our office to contact the answering service for urgent concerns during non-business hours.  If you have FMLA/disability paperwork, please drop it off or fax it to [phone], attention Patty.  Appointments/FMLA & disability paperwork: Joycelyn Rua, & Flonnie Hailstone Registered Nurses/Surgery schedulers: Ilsa Iha Medical Assistants: Nash Mantis Physician Assistants: Joan Flores, PA-C, Manning Charity, PA-C & Drake Leach, PA-C Surgeons: Venetia Night, MD & Ernestine Mcmurray, MD   [facility] PREADMIT TESTING VISIT and SURGERY INFORMATION SHEET   Now that surgery has been scheduled you can anticipate several phone calls from [facility] services.  A pharmacy technician will call you to verify your current list of medications taken at home.               The Pre-Service Center will call to verify your insurance information and to give you billing estimates and information.             The Preadmit Testing Office will be calling to schedule a visit to obtain information for the anesthesia team and provide instructions on preparation for surgery.  What can you expect for the Preadmit Testing Visit: Appointments may be scheduled in-person or by telephone.  If a telephone visit is scheduled, you may be asked to come into the office to have lab tests or other studies performed.   This visit will not be completed any greater than 14 days prior to your surgery.  If your surgery has been scheduled for a future date, please do not be alarmed if we have not contacted you to schedule an appointment more than a month prior  to the surgery date.    Please be prepared to provide the following information during this appointment:            -Personal medical history                                               -Medication and allergy list            -Any history of problems with anesthesia              -Recent lab work or diagnostic studies            -Please notify us of any needs  we should be aware of to provide the best care possible           -You will be provided with instructions on how to prepare for your surgery.  On The Day of Surgery:  You must have a driver to take you home after surgery, you will be asked not to drive for 24 hours following surgery.  Taxi, Benedetto Goad and non-medical transport will not be acceptable means of transportation unless you have a responsible individual who will be traveling with you.  Visitors in the surgical area:   2 people will be able to visit you in your room once your preparation for surgery has been completed. During surgery, your visitors will be asked to wait in the Surgery Waiting Area.  It is not a requirement for them to stay, if they prefer to leave and come back.  Your visitor(s) will be given an update once the surgery has been completed.  No visitors are allowed in the initial recovery room to respect patient privacy and safety.  Once you are more awake and transfer to the secondary recovery area, or are transferred to an inpatient room, visitors will again be able to see you.  To respect and protect your privacy: We will ask on the day of surgery who your driver will be and what the contact number for that individual will be. We will ask if it is okay to share information with this individual, or if there is an alternative individual that we, or the surgeon, should contact to provide updates and information. If family or friends come to the surgical information desk requesting information about you, who you have not listed with [place], no information will be given.   It may be helpful to designate someone as the main contact who will be responsible for updating your other friends and family.    PREADMIT TESTING OFFICE: [phone] SAME DAY SURGERY: [phone] We look forward to caring for you before and throughout the process of your surgery.

## 2023-04-09 NOTE — Telephone Encounter (Signed)
 I was able to reach Ms. Coldwell by the phone to discuss her EMG findings.  It demonstrated the severe right-sided carpal tunnel syndrome which we expected based off of her symptoms.  Given the severity I did discuss with her the decompressive procedural option of a carpal tunnel decompression with ultrasound guidance.  She states that it wakes her up and keeps her up every night and that her symptoms continue to get worse.  Because of the severity and her lack of improvement with conservative care and bracing I feel like she would benefit from a carpal tunnel release.  We discussed risk and benefits.  She like to go forward.  I let her know that my team would reach out to her to get this scheduled.  She will likely need to be off of her warfarin for short period of time and we can discuss this with the preoperative team as well as Dr. Darrick Huntsman.

## 2023-04-10 NOTE — Telephone Encounter (Signed)
   Pre-operative Risk Assessment    Patient Name: [name]  DOB: April 09, 1950 MRN: [id number]  Date of last office visit: 01/13/2023 (office visit) 2/14/20205 (telemedicine visit) Date of next office visit: 07/14/2023  Request for Surgical Clearance Procedure:  Right Carpal Tunnel Release Date of Surgery:  Clearance 04/28/23                              Surgeon:  Dr. Ernestine Mcmurray Surgeon's Group or Practice Name:  [facility] [place] Phone number:  [phone] Fax number:  [phone] Type of Anesthesia:  MAC  Clearances: Medical Clearance and Medication Clearance  Hold: Warfarin 7 days prior to procedure Resume: Warfarin 3 days after procedure Ok to stay on Aspirin 81 mg?  Additional requests/questions:  Is the patient optimized for the above procedure?  Yes  No   Risk: Low    Moderate   High   Signed, Layan Zalenski   04/10/2023, 3:19 PM

## 2023-04-10 NOTE — Telephone Encounter (Signed)
 I called [name] to decide on a date for surgery and discuss further information as noted below.   Please see below for information in regard to your upcoming surgery:   Planned surgery: Right Wrist Carpal Tunnel Release   Surgery date: 04/28/23  at [facility] (Medical Mall: [address] [place], [place] [address]) - you will find out your arrival time the business day before your surgery.   Pre-op appointment at [facility] Pre-admit Testing: you will receive a call with a date/time for this appointment. If you are scheduled for an in person appointment, Pre-admit Testing is located on the first floor of the [facility] building, [address], [address]. During this appointment, they will advise you which medications you can take the morning of surgery, and which medications you will need to hold for surgery. Labs (such as blood work, EKG) may be done at your pre-op appointment. You are not required to fast for these labs. Should you need to change your pre-op appointment, please call Pre-admit testing at [phone].    Blood thinners:    Warfarin: stop Warfarin 7 days prior, resume 3 days after. We are reaching out to your PCP to clear you to hold this medication and give the ok to stay on Aspirin 81 mg.    Surgical clearance: we will send a clearance form to Duncan Dull MD. They may wish to see you in their office prior to signing the clearance form. If so, they may call you to schedule an appointment.   How to contact us: If you have any questions/concerns before or after surgery, you can reach [place] at [phone], or you can send a mychart message. We can be reached by phone or mychart 8am-4pm, Monday-Friday. *Please note: Calls after 4pm are forwarded to a third party answering service. Mychart messages are not routinely monitored during evenings, weekends, and holidays. Please call our office to contact the answering service for urgent concerns  during non-business hours.   Appointments/FMLA & disability paperwork: Joycelyn Rua, & Flonnie Hailstone Registered Nurses/Surgery schedulers: Ilsa Iha Medical Assistants: Nash Mantis Physician Assistants: Joan Flores, PA-C, Manning Charity, PA-C & Drake Leach, PA-C Surgeons: Venetia Night, MD & Ernestine Mcmurray, MD   [facility] PREADMIT TESTING VISIT and SURGERY INFORMATION SHEET    Now that surgery has been scheduled you can anticipate several phone calls from [facility] services. A pharmacy technician will call you to verify your current list of medications taken at home.    The Pre-Service Center will call to verify your insurance information and to give you billing estimates and information. The Preadmit Testing Office will be calling to schedule a visit to obtain information for the anesthesia team and provide instructions on preparation for surgery.   What can you expect for the Preadmit Testing Visit: Appointments may be scheduled in-person or by telephone. If a telephone visit is scheduled, you may be asked to come into the office to have lab tests or other studies performed.   This visit will not be completed any greater than 14 days prior to your surgery. If your surgery has been scheduled for a future date, please do not be alarmed if we have not contacted you to schedule an appointment more than a month prior to the surgery date.    Please be prepared to provide the following information during this appointment: -Personal medical history -Medication and allergy list -Any history of problems with anesthesia -Recent  lab work or diagnostic studies -Please notify us of any needs we should be aware of to provide the best care possible -You will be provided with instructions on how to prepare for your surgery.   On The Day of Surgery:   You must have a driver to take you home after surgery, you will be asked not to drive for 24 hours following surgery. Taxi, Benedetto Goad  and non-medical transport will not be acceptable means of transportation unless you have a responsible individual who will be traveling with you.   Visitors in the surgical area:   2 people will be able to visit you in your room once your preparation for surgery has been completed. During surgery, your visitors will be asked to wait in the Surgery Waiting Area. It is not a requirement for them to stay, if they prefer to leave and come back. Your visitor(s) will be given an update once the surgery has been completed.   No visitors are allowed in the initial recovery room to respect patient privacy and safety.   Once you are more awake and transfer to the secondary recovery area, or are transferred to an inpatient room, visitors will again be able to see you.   To respect and protect your privacy: We will ask on the day of surgery who your driver will be and what the contact number for that individual will be. We will ask if it is okay to share information with this individual, or if there is an alternative individual that we, or the surgeon, should contact to provide updates and information. If family or friends come to the surgical information desk requesting information about you, who you have not listed with [place], no information will be given.   It may be helpful to designate someone as the main contact who will be responsible for updating your other friends and family.    PREADMIT TESTING OFFICE: [phone] SAME DAY SURGERY: [phone] We look forward to caring for you before and throughout the process of your surgery.

## 2023-04-13 ENCOUNTER — Other Ambulatory Visit: Payer: Self-pay

## 2023-04-13 DIAGNOSIS — Z01818 Encounter for other preprocedural examination: Secondary | ICD-10-CM

## 2023-04-13 NOTE — Addendum Note (Signed)
 Addended by: Barbette Hair on: 04/13/2023 02:20 PM   Modules accepted: Orders

## 2023-04-13 NOTE — Telephone Encounter (Signed)
 Please advise holding Coumadin prior to right carpal tunnel release 4/22  Thank you!  DW

## 2023-04-13 NOTE — Telephone Encounter (Signed)
   Name: [name]  DOB: 1950/05/24  MRN: [id number]  Primary Cardiologist: Debbe Odea, MD   Preoperative team, please contact this patient and set up a phone call appointment for further preoperative risk assessment. Please obtain consent and complete medication review. Thank you for your help.  I confirm that guidance regarding antiplatelet and oral anticoagulation therapy has been completed and, if necessary, noted below.  Warfarin and aspirin are not prescribed by cardiology service.  Please reach out to prescribing providers for further recommendation on medication hold.  I also confirmed the patient resides in the state of [place]. As per [facility] Medical Board telemedicine laws, the patient must reside in the state in which the provider is licensed.   Denyce Robert, NP 04/13/2023, 1:04 PM  [facility]

## 2023-04-13 NOTE — Telephone Encounter (Signed)
I left a message for the patient to call our office to schedule a tele visit for pre-op clearance.  

## 2023-04-13 NOTE — Telephone Encounter (Signed)
 I will forward to preop APP as it looks like this was never sent to preop APP for review.

## 2023-04-13 NOTE — Telephone Encounter (Signed)
 Clearance form was faxed to Dr Darrick Huntsman by Barbette Hair, RN on 04/13/23.

## 2023-04-13 NOTE — Telephone Encounter (Addendum)
 I will update all parties involved pt has a tele preop appt 04/14/23.

## 2023-04-14 ENCOUNTER — Ambulatory Visit: Attending: Cardiology | Admitting: Emergency Medicine

## 2023-04-14 DIAGNOSIS — Z01810 Encounter for preprocedural cardiovascular examination: Secondary | ICD-10-CM | POA: Diagnosis not present

## 2023-04-14 NOTE — Telephone Encounter (Signed)
 Patient on warfarin for non-cardiac indication and is managed by PCP. Will defer to PCP

## 2023-04-14 NOTE — Progress Notes (Signed)
 Virtual Visit via Telephone Note   Because of [name] co-morbid illnesses, she is at least at moderate risk for complications without adequate follow up.  This format is felt to be most appropriate for this patient at this time.  Due to technical limitations with video connection Web designer), today's appointment will be conducted as an audio only telehealth visit, and [name] verbally agreed to proceed in this manner.   All issues noted in this document were discussed and addressed.  No physical exam could be performed with this format.  Evaluation Performed:  Preoperative cardiovascular risk assessment _____________   Date:  04/14/2023   Patient ID:  [name], DOB 1950-11-19, MRN [id number] Patient Location:  Home Provider location:   Office  Primary Care Provider:  Sherlene Shams, MD Primary Cardiologist:  Debbe Odea, MD  Chief Complaint / Patient Profile   73 y.o. y/o female with a h/o hypertension, hyperlipidemia, protein C deficiency, PE on warfarin who is pending right carpal tunnel release on 04/28/2023 with [facility] [place] by Dr. Ernestine Mcmurray and presents today for telephonic preoperative cardiovascular risk assessment.  History of Present Illness    [name] is a 73 y.o. female who presents via audio/video conferencing for a telehealth visit today.  Pt was last seen in cardiology clinic on 01/19/2023 by Dr. Azucena Cecil.  At that time [name] was doing well.  The patient is now pending procedure as outlined above. Since her last visit, she denies chest pain, shortness of breath, lower extremity edema, fatigue, palpitations, melena, hematuria, hemoptysis, diaphoresis, weakness, presyncope, syncope, orthopnea, and PND. Patient is doing well without any cardiovascular concerns or complaints. She remains very active and is without any angina symptoms.   Past Medical History    Past Medical History:  Diagnosis Date   B12 deficiency     Chronic ischemic colitis, enteritis, or enterocolitis (HCC) 04/08/2011   S/p right colectomy May 2013, [place]   Clotting disorder [facility])    Depression    Diabetes mellitus without complication (HCC)    pre diabetic   Fire ant bite 05/15/2020   GERD (gastroesophageal reflux disease)    H/O toxic shock syndrome 2001   post urologic procedure for stone removal   Headache    migraines   History of pulmonary embolus (PE) Sept 2011   secondary to Protein c& S deficiency, Lupus ACA   HOH (hard of hearing)    wears hearing aids   Hyperlipidemia    Hypertension    Ischemic colitis (HCC) 2012-2013   s/p left colectomy May 2013   Ischemic colitis (HCC)    Kidney stones    PONV (postoperative nausea and vomiting)    Past Surgical History:  Procedure Laterality Date   ABDOMINAL HYSTERECTOMY  1996   APPENDECTOMY     bilateral tubal ligation     BREAST EXCISIONAL BIOPSY Left 1989   benign   BREAST SURGERY  1989   left biopsy, normal   CATARACT EXTRACTION W/PHACO Right 05/30/2019   Procedure: CATARACT EXTRACTION PHACO AND INTRAOCULAR LENS PLACEMENT (IOC) RIGHT DIABETIC;  Surgeon: Nevada Crane, MD;  Location: [facility];  Service: Ophthalmology;  Laterality: Right;  3.84 0:37.1   CATARACT EXTRACTION W/PHACO Left 06/20/2019   Procedure: CATARACT EXTRACTION PHACO AND INTRAOCULAR LENS PLACEMENT (IOC) LEFT DIABETIC 2.58  00:29.3;  Surgeon: Nevada Crane, MD;  Location: [facility];  Service: Ophthalmology;  Laterality: Left;  Diabetic   COLECTOMY  Left May 2013   [place], ischemic colitis   COLONOSCOPY WITH PROPOFOL N/A 11/16/2014   Procedure: COLONOSCOPY WITH PROPOFOL;  Surgeon: Scot Jun, MD;  Location: [facility] END[facility];  Service: Endoscopy;  Laterality: N/A;   COLONOSCOPY WITH PROPOFOL N/A 07/12/2021   Procedure: COLONOSCOPY WITH PROPOFOL;  Surgeon: Regis Bill, MD;  Location: [facility] ENDOSCOPY;  Service: Endoscopy;  Laterality: N/A;   ESOPHAGOGASTRODUODENOSCOPY  (EGD) WITH PROPOFOL N/A 11/16/2014   Procedure: ESOPHAGOGASTRODUODENOSCOPY (EGD) WITH PROPOFOL;  Surgeon: Scot Jun, MD;  Location: [facility] END[facility]COPY;  Service: Endoscopy;  Laterality: N/A;   ESOPHAGOGASTRODUODENOSCOPY (EGD) WITH PROPOFOL N/A 07/12/2021   Procedure: ESOPHAGOGASTRODUODENOSCOPY (EGD) WITH PROPOFOL;  Surgeon: Regis Bill, MD;  Location: [facility] ENDOSCOPY;  Service: Endoscopy;  Laterality: N/A;   SPINE SURGERY  1984   lumbar diskectomy, [place]   ureterolithotomy     Dr. Artis Flock urology ~ 2012 kidney stones in ICU after surgery    Allergies  Allergies  Allergen Reactions   Sulfamethoxazole-Trimethoprim Other (See Comments)    Headache    Home Medications    Prior to Admission medications   Medication Sig Start Date End Date Taking? Authorizing Provider  amLODipine (NORVASC) 5 MG tablet Take 1 tablet (5 mg total) by mouth daily. 02/20/23   Sherlene Shams, MD  ARIPiprazole (ABILIFY) 2 MG tablet Take 2 mg by mouth daily. 01/29/23   [provider]  aspirin 81 MG tablet Take 81 mg by mouth daily.    [provider]  atorvastatin (LIPITOR) 40 MG tablet Take 1 tablet (40 mg total) by mouth daily. 02/20/23   Sherlene Shams, MD  buPROPion (WELLBUTRIN XL) 150 MG 24 hr tablet Take 1 tablet (150 mg total) by mouth daily. 02/20/23   Sherlene Shams, MD  Cholecalciferol (VITAMIN D3) 1000 UNITS CAPS Take by mouth.    [provider]  clonazePAM (KLONOPIN) 1 MG tablet Take 1 mg by mouth at bedtime as needed. 12/15/22 03/02/23  [provider]  cyanocobalamin (VITAMIN B12) 1000 MCG/ML injection INJECT INTO THE MUSCLE EVERY 30 DAYS 11/05/21   Sherlene Shams, MD  diphenhydrAMINE (BENADRYL) 25 mg capsule Take 25 mg by mouth every 8 (eight) hours as needed. 11/21/22   [provider]  furosemide (LASIX) 20 MG tablet Take 1 tablet (20 mg total) by mouth 2 (two) times a week. Tuesday and Friday 12/18/22   Sherlene Shams, MD   hydrochlorothiazide (HYDRODIURIL) 25 MG tablet Take 1 tablet (25 mg total) by mouth daily. 02/20/23   Sherlene Shams, MD  methocarbamol (ROBAXIN) 500 MG tablet Take 500 mg by mouth at bedtime. 02/16/23   [provider]  mirtazapine (REMERON) 15 MG tablet Take 7.5-15 mg by mouth at bedtime. 01/25/23   [provider]  Omega-3 Fatty Acids (FISH OIL) 1000 MG CAPS Take 1 capsule daily by mouth.    [provider]  omeprazole (PRILOSEC) 20 MG capsule Take 1 capsule (20 mg total) by mouth daily. 11/03/22   Sherlene Shams, MD  ondansetron (ZOFRAN) 4 MG tablet Take 1 tablet (4 mg total) by mouth every 8 (eight) hours as needed for nausea or vomiting. 11/03/22   Sherlene Shams, MD  pregabalin (LYRICA) 75 MG capsule Take 1 capsule (75 mg total) by mouth 2 (two) times daily. 02/20/23   Sherlene Shams, MD  promethazine (PHENERGAN) 12.5 MG tablet Take 12.5 mg by mouth every 6 (six) hours as needed. 11/21/22   [provider]  telmisartan (  MICARDIS) 80 MG tablet Take 1 tablet (80 mg total) by mouth daily. 01/13/23   Sherlene Shams, MD  warfarin (COUMADIN) 4 MG tablet Take 1 tablet (4 mg total) by mouth daily. Alternating with 5 mg daily 01/28/23   Sherlene Shams, MD  warfarin (COUMADIN) 5 MG tablet 1 tablet daily x 5 days per week,  4 mg all other days 03/27/23   Sherlene Shams, MD    Physical Exam    Vital Signs:  [name] does not have vital signs available for review today.  Given telephonic nature of communication, physical exam is limited. AAOx3. NAD. Normal affect.  Speech and respirations are unlabored.  Accessory Clinical Findings    None  Assessment & Plan    1.  Preoperative Cardiovascular Risk Assessment: According to the Revised Cardiac Risk Index (RCRI), her Perioperative Risk of Major Cardiac Event is (%): 0.4. Her Functional Capacity in METs is: 8.27 according to the Duke Activity Status Index (DASI). Therefore, based on ACC/AHA guidelines,  patient would be at acceptable risk for the planned procedure without further cardiovascular testing.   The patient was advised that if she develops new symptoms prior to surgery to contact our office to arrange for a follow-up visit, and she verbalized understanding.  Warfarin and Aspirin are not prescribed by cardiology service. Please reach out to prescribing provider for further recommendation on medication hold prior to surgery.   A copy of this note will be routed to requesting surgeon.  Time:   Today, I have spent 8 minutes with the patient with telehealth technology discussing medical history, symptoms, and management plan.     Denyce Robert, NP  04/14/2023, 9:56 AM

## 2023-04-15 ENCOUNTER — Telehealth: Payer: Self-pay

## 2023-04-15 NOTE — Telephone Encounter (Signed)
 I spoke with [name] to notify her that Dr. Darrick Huntsman is ok with her staying off of her Warfarin 7 days prior to procedure and restarting 3 days after. Also that it is ok for the patient to stay on her 81mg  of aspirin.   We discussed she should stop the medication on 4/14 to allow 7 full days off the medication prior to her surgery.   She indicates understanding at this time.

## 2023-04-21 ENCOUNTER — Other Ambulatory Visit: Payer: Self-pay | Admitting: Internal Medicine

## 2023-04-21 ENCOUNTER — Encounter
Admission: RE | Admit: 2023-04-21 | Discharge: 2023-04-21 | Disposition: A | Discharge status: Home / Self Care | Source: Ambulatory Visit | Attending: Neurosurgery | Admitting: Neurosurgery

## 2023-04-21 ENCOUNTER — Other Ambulatory Visit: Payer: Self-pay

## 2023-04-21 VITALS — Ht 61.0 in | Wt 160.0 lb

## 2023-04-21 DIAGNOSIS — E860 Dehydration: Secondary | ICD-10-CM

## 2023-04-21 DIAGNOSIS — I1A0 Resistant hypertension: Secondary | ICD-10-CM

## 2023-04-21 DIAGNOSIS — Z9229 Personal history of other drug therapy: Secondary | ICD-10-CM

## 2023-04-21 HISTORY — DX: Personal history of urinary calculi: Z87.442

## 2023-04-21 HISTORY — DX: Prediabetes: R73.03

## 2023-04-21 HISTORY — DX: Long term (current) use of anticoagulants: Z79.01

## 2023-04-21 NOTE — Patient Instructions (Addendum)
 Your procedure is scheduled on: Tuesday 04/28/23 To find out your arrival time, please call [phone] between 1PM - 3PM on:  Monday 04/27/23  Report to the Registration Desk on the 1st floor of the Medical Mall. FREE Valet parking is available.  If your arrival time is 6:00 am, do not arrive before that time as the Medical Mall entrance doors do not open until 6:00 am.  REMEMBER: Instructions that are not followed completely may result in serious medical risk, up to and including death; or upon the discretion of your surgeon and anesthesiologist your surgery may need to be rescheduled.  Do not eat food after midnight the night before surgery.  No gum chewing or hard candies.  You may however, drink CLEAR liquids up to 2 hours before you are scheduled to arrive for your surgery. Do not drink anything within 2 hours of your scheduled arrival time.  Clear liquids include: - water  - apple juice without pulp - gatorade (not RED colors) - black coffee or tea (Do NOT add milk or creamers to the coffee or tea) Do NOT drink anything that is not on this list.  One week prior to surgery: Stop Anti-inflammatories (NSAIDS) such as Advil, Aleve, Ibuprofen, Motrin, Naproxen, Naprosyn and Aspirin based products such as Excedrin, Goody's Powder, BC Powder. You may however, continue to take Tylenol if needed for pain up until the day of surgery.  Stop ANY OVER THE COUNTER supplements and vitamins TODAY 04/21/23 until after surgery.  Continue taking all prescribed medications with the exception of the following: WARFARIN AS INSTRUCTED Continue your Aspirin but do not take on Tuesday 04/28/23  TAKE ONLY THESE MEDICATIONS THE MORNING OF SURGERY WITH A SIP OF WATER:  amLODipine (NORVASC) 5 MG tablet  buPROPion (WELLBUTRIN XL) 150 MG 24 hr tablet  metoprolol succinate (TOPROL-XL) 100 MG 24 hr tablet  omeprazole (PRILOSEC) 20 MG capsule  Antacid (take one the night before and one on the morning of  surgery - helps to prevent nausea after surgery.)  No Alcohol for 24 hours before or after surgery.  No Smoking including e-cigarettes for 24 hours before surgery.  No chewable tobacco products for at least 6 hours before surgery.  No nicotine patches on the day of surgery.  Do not use any "recreational" drugs for at least a week (preferably 2 weeks) before your surgery.  Please be advised that the combination of cocaine and anesthesia may have negative outcomes, up to and including death. If you test positive for cocaine, your surgery will be cancelled.  On the morning of surgery brush your teeth with toothpaste and water, you may rinse your mouth with mouthwash if you wish. Do not swallow any toothpaste or mouthwash.  Use CHG Soap or wipes as directed on instruction sheet.  Do not wear lotions, powders, or perfumes. No Deodorant  Do not shave body hair from the neck down 48 hours before surgery.  Wear comfortable clothing (specific to your surgery type) to the hospital.  Do not wear jewelry, make-up, hairpins, clips or nail polish.  For welded (permanent) jewelry: bracelets, anklets, waist bands, etc.  Please have this removed prior to surgery.  If it is not removed, there is a chance that hospital personnel will need to cut it off on the day of surgery. Contact lenses, hearing aids and dentures may not be worn into surgery.  Do not bring valuables to the hospital. [facility] is not responsible for any missing/lost belongings or valuables.  Notify your doctor if there is any change in your medical condition (cold, fever, infection).  If you are being discharged the day of surgery, you will not be allowed to drive home. You will need a responsible individual to drive you home and stay with you for 24 hours after surgery.   If you are taking public transportation, you will need to have a responsible individual with you.  If you are being admitted to the hospital overnight,  leave your suitcase in the car. After surgery it may be brought to your room.  In case of increased patient census, it may be necessary for you, the patient, to continue your postoperative care in the Same Day Surgery department.  After surgery, you can help prevent lung complications by doing breathing exercises.  Take deep breaths and cough every 1-2 hours. Your doctor may order a device called an Incentive Spirometer to help you take deep breaths.  Surgery Visitation Policy:  Patients undergoing a surgery or procedure may have two family members or support persons with them as long as the person is not COVID-19 positive or experiencing its symptoms.   Inpatient Visitation:    Visiting hours are 7 a.m. to 8 p.m. Up to four visitors are allowed at one time in a patient room. The visitors may rotate out with other people during the day. One designated support person (adult) may remain overnight.  Please call the Pre-admissions Testing Dept. at [phone] if you have any questions about these instructions.     Preparing for Surgery with CHLORHEXIDINE GLUCONATE (CHG) Soap  Chlorhexidine Gluconate (CHG) Soap  o An antiseptic cleaner that kills germs and bonds with the skin to continue killing germs even after washing  o Used for showering the night before surgery and morning of surgery  Before surgery, you can play an important role by reducing the number of germs on your skin.  CHG (Chlorhexidine gluconate) soap is an antiseptic cleanser which kills germs and bonds with the skin to continue killing germs even after washing.  Please do not use if you have an allergy to CHG or antibacterial soaps. If your skin becomes reddened/irritated stop using the CHG.  1. Shower the NIGHT BEFORE SURGERY and the MORNING OF SURGERY with CHG soap.  2. If you choose to wash your hair, wash your hair first as usual with your normal shampoo.  3. After shampooing, rinse your hair and body  thoroughly to remove the shampoo.  4. Use CHG as you would any other liquid soap. You can apply CHG directly to the skin and wash gently with a scrungie or a clean washcloth.  5. Apply the CHG soap to your body only from the neck down. Do not use on open wounds or open sores. Avoid contact with your eyes, ears, mouth, and genitals (private parts). Wash face and genitals (private parts) with your normal soap.  6. Wash thoroughly, paying special attention to the area where your surgery will be performed.  7. Thoroughly rinse your body with warm water.  8. Do not shower/wash with your normal soap after using and rinsing off the CHG soap.  9. Pat yourself dry with a clean towel.  10. Wear clean pajamas to bed the night before surgery.  12. Place clean sheets on your bed the night of your first shower and do not sleep with pets.  13. Shower again with the CHG soap on the day of surgery prior to arriving at the hospital.  14.  Do not apply any deodorants/lotions/powders.  15. Please wear clean clothes to the hospital.

## 2023-04-23 ENCOUNTER — Encounter: Payer: Self-pay | Admitting: Neurosurgery

## 2023-04-23 ENCOUNTER — Encounter
Admission: RE | Admit: 2023-04-23 | Discharge: 2023-04-23 | Disposition: A | Discharge status: Home / Self Care | Source: Ambulatory Visit | Attending: Neurosurgery | Admitting: Neurosurgery

## 2023-04-23 DIAGNOSIS — I1A0 Resistant hypertension: Secondary | ICD-10-CM | POA: Insufficient documentation

## 2023-04-23 DIAGNOSIS — E871 Hypo-osmolality and hyponatremia: Secondary | ICD-10-CM | POA: Insufficient documentation

## 2023-04-23 DIAGNOSIS — Z9229 Personal history of other drug therapy: Secondary | ICD-10-CM | POA: Diagnosis not present

## 2023-04-23 DIAGNOSIS — Z01812 Encounter for preprocedural laboratory examination: Secondary | ICD-10-CM | POA: Diagnosis not present

## 2023-04-23 DIAGNOSIS — E860 Dehydration: Secondary | ICD-10-CM | POA: Insufficient documentation

## 2023-04-23 LAB — CBC
HCT: 40.7 % (ref 36.0–46.0)
Hemoglobin: 13.4 g/dL (ref 12.0–15.0)
MCH: 28.5 pg (ref 26.0–34.0)
MCHC: 32.9 g/dL (ref 30.0–36.0)
MCV: 86.6 fL (ref 80.0–100.0)
Platelets: 172 10*3/uL (ref 150–400)
RBC: 4.7 MIL/uL (ref 3.87–5.11)
RDW: 13.9 % (ref 11.5–15.5)
WBC: 5.4 10*3/uL (ref 4.0–10.5)
nRBC: 0 % (ref 0.0–0.2)

## 2023-04-23 LAB — BASIC METABOLIC PANEL WITH GFR
Anion gap: 7 (ref 5–15)
BUN: 15 mg/dL (ref 8–23)
CO2: 27 mmol/L (ref 22–32)
Calcium: 9.1 mg/dL (ref 8.9–10.3)
Chloride: 104 mmol/L (ref 98–111)
Creatinine, Ser: 0.92 mg/dL (ref 0.44–1.00)
GFR, Estimated: >60 mL/min (ref >60)
Glucose, Bld: 107 mg/dL — ABNORMAL HIGH (ref 70–99)
Potassium: 4 mmol/L (ref 3.5–5.1)
Sodium: 138 mmol/L (ref 135–145)

## 2023-04-23 LAB — PROTIME-INR
INR: 1.7 — ABNORMAL HIGH (ref 0.8–1.2)
Prothrombin Time: 19.7 s — ABNORMAL HIGH (ref 11.4–15.2)

## 2023-04-27 MED ORDER — CHLORHEXIDINE GLUCONATE 0.12 % MT SOLN
15.0000 mL | Freq: Once | OROMUCOSAL | Status: AC
  Administered 2023-04-28: 15 mL via OROMUCOSAL

## 2023-04-27 MED ORDER — ORAL CARE MOUTH RINSE: 15.0000 mL | Freq: Once | OROMUCOSAL | Status: AC

## 2023-04-27 MED ORDER — LACTATED RINGERS IV SOLN
INTRAVENOUS | Status: DC
Start: 2023-04-27 — End: 2023-04-28

## 2023-04-27 MED ORDER — CEFAZOLIN SODIUM-DEXTROSE 2-4 GM/100ML-% IV SOLN
2.0000 g | INTRAVENOUS | Status: AC
  Administered 2023-04-28: 2 g via INTRAVENOUS

## 2023-04-27 MED ORDER — CEFAZOLIN IN SODIUM CHLORIDE 2-0.9 GM/100ML-% IV SOLN
2.0000 g | Freq: Once | INTRAVENOUS | Status: DC
  Filled 2023-04-27: qty 100

## 2023-04-27 MED ORDER — CEFAZOLIN SODIUM-DEXTROSE 2-4 GM/100ML-% IV SOLN: 2.0000 g | INTRAVENOUS | Status: DC

## 2023-04-28 ENCOUNTER — Other Ambulatory Visit: Payer: Self-pay

## 2023-04-28 ENCOUNTER — Encounter: Payer: Self-pay | Admitting: Neurosurgery

## 2023-04-28 ENCOUNTER — Ambulatory Visit: Payer: Self-pay | Admitting: Urgent Care

## 2023-04-28 ENCOUNTER — Ambulatory Visit
Admission: RE | Admit: 2023-04-28 | Discharge: 2023-04-28 | Disposition: A | Discharge status: Home / Self Care | Attending: Neurosurgery | Admitting: Neurosurgery

## 2023-04-28 ENCOUNTER — Encounter
Admission: RE | Disposition: A | Discharge status: Home / Self Care | Payer: Self-pay | Source: Home / Self Care | Attending: Neurosurgery

## 2023-04-28 DIAGNOSIS — I700 Atherosclerosis of aorta: Secondary | ICD-10-CM | POA: Insufficient documentation

## 2023-04-28 DIAGNOSIS — G5601 Carpal tunnel syndrome, right upper limb: Secondary | ICD-10-CM | POA: Diagnosis not present

## 2023-04-28 DIAGNOSIS — Z9229 Personal history of other drug therapy: Secondary | ICD-10-CM

## 2023-04-28 DIAGNOSIS — R7303 Prediabetes: Secondary | ICD-10-CM | POA: Insufficient documentation

## 2023-04-28 DIAGNOSIS — Z01818 Encounter for other preprocedural examination: Secondary | ICD-10-CM

## 2023-04-28 DIAGNOSIS — D6862 Lupus anticoagulant syndrome: Secondary | ICD-10-CM | POA: Diagnosis not present

## 2023-04-28 DIAGNOSIS — Z86711 Personal history of pulmonary embolism: Secondary | ICD-10-CM | POA: Diagnosis not present

## 2023-04-28 DIAGNOSIS — I10 Essential (primary) hypertension: Secondary | ICD-10-CM | POA: Diagnosis not present

## 2023-04-28 DIAGNOSIS — K219 Gastro-esophageal reflux disease without esophagitis: Secondary | ICD-10-CM | POA: Diagnosis not present

## 2023-04-28 DIAGNOSIS — Z7901 Long term (current) use of anticoagulants: Secondary | ICD-10-CM | POA: Diagnosis not present

## 2023-04-28 HISTORY — DX: Atherosclerosis of aorta: I70.0

## 2023-04-28 HISTORY — DX: Migraine, unspecified, not intractable, without status migrainosus: G43.909

## 2023-04-28 HISTORY — DX: Long term (current) use of anticoagulants: Z79.01

## 2023-04-28 HISTORY — DX: Long term (current) use of aspirin: Z79.82

## 2023-04-28 HISTORY — PX: CARPAL TUNNEL RELEASE: SHX101

## 2023-04-28 LAB — PROTIME-INR
INR: 1 (ref 0.8–1.2)
Prothrombin Time: 13 s (ref 11.4–15.2)

## 2023-04-28 SURGERY — CARPAL TUNNEL RELEASE
Anesthesia: General | Laterality: Right

## 2023-04-28 MED ORDER — PHENYLEPHRINE HCL-NACL 20-0.9 MG/250ML-% IV SOLN
INTRAVENOUS | Status: AC
  Filled 2023-04-28: qty 250

## 2023-04-28 MED ORDER — ONDANSETRON HCL 4 MG/2ML IJ SOLN
INTRAMUSCULAR | Status: AC
  Filled 2023-04-28: qty 2

## 2023-04-28 MED ORDER — DEXAMETHASONE SODIUM PHOSPHATE 10 MG/ML IJ SOLN
INTRAMUSCULAR | Status: AC
  Filled 2023-04-28: qty 1

## 2023-04-28 MED ORDER — OXYCODONE HCL 5 MG/5ML PO SOLN: 5.0000 mg | Freq: Once | ORAL | Status: AC | PRN

## 2023-04-28 MED ORDER — FENTANYL CITRATE (PF) 100 MCG/2ML IJ SOLN
INTRAMUSCULAR | Status: AC
Start: 2023-04-28 — End: ?
  Filled 2023-04-28: qty 2

## 2023-04-28 MED ORDER — OXYCODONE HCL 5 MG PO TABS
ORAL_TABLET | ORAL | Status: AC
  Filled 2023-04-28: qty 1

## 2023-04-28 MED ORDER — PROPOFOL 1000 MG/100ML IV EMUL
INTRAVENOUS | Status: AC
  Filled 2023-04-28: qty 200

## 2023-04-28 MED ORDER — ROCURONIUM BROMIDE 10 MG/ML (PF) SYRINGE
PREFILLED_SYRINGE | INTRAVENOUS | Status: AC
  Filled 2023-04-28: qty 10

## 2023-04-28 MED ORDER — ACETAMINOPHEN 500 MG PO TABS
1000.0000 mg | ORAL_TABLET | Freq: Three times a day (TID) | ORAL | 0 refills | Status: AC | PRN
  Filled 2023-04-28: qty 30, 5d supply, fill #0

## 2023-04-28 MED ORDER — OXYCODONE HCL 5 MG PO TABS
2.5000 mg | ORAL_TABLET | Freq: Four times a day (QID) | ORAL | 0 refills | Status: DC | PRN
  Filled 2023-04-28: qty 10, 3d supply, fill #0

## 2023-04-28 MED ORDER — LIDOCAINE HCL (PF) 2 % IJ SOLN
INTRAMUSCULAR | Status: AC
  Filled 2023-04-28: qty 5

## 2023-04-28 MED ORDER — FENTANYL CITRATE (PF) 100 MCG/2ML IJ SOLN
INTRAMUSCULAR | Status: DC | PRN
  Administered 2023-04-28: 25 ug via INTRAVENOUS

## 2023-04-28 MED ORDER — OXYCODONE HCL 5 MG PO TABS
5.0000 mg | ORAL_TABLET | Freq: Once | ORAL | Status: AC | PRN
  Administered 2023-04-28: 5 mg via ORAL

## 2023-04-28 MED ORDER — SENNA 8.6 MG PO TABS
1.0000 | ORAL_TABLET | Freq: Every day | ORAL | 0 refills | Status: DC
  Filled 2023-04-28: qty 100, 100d supply, fill #0

## 2023-04-28 MED ORDER — PROPOFOL 500 MG/50ML IV EMUL
INTRAVENOUS | Status: DC | PRN
Start: 2023-04-28 — End: 2023-04-28
  Administered 2023-04-28: 125 ug/kg/min via INTRAVENOUS

## 2023-04-28 MED ORDER — LIDOCAINE HCL (CARDIAC) PF 100 MG/5ML IV SOSY
PREFILLED_SYRINGE | INTRAVENOUS | Status: DC | PRN
  Administered 2023-04-28: 40 mg via INTRAVENOUS

## 2023-04-28 MED ORDER — ONDANSETRON HCL 4 MG/2ML IJ SOLN
INTRAMUSCULAR | Status: DC | PRN
  Administered 2023-04-28: 4 mg via INTRAVENOUS

## 2023-04-28 MED ORDER — LIDOCAINE HCL 1 % IJ SOLN
INTRAMUSCULAR | Status: DC | PRN
  Administered 2023-04-28: 5 mL

## 2023-04-28 MED ORDER — CHLORHEXIDINE GLUCONATE 0.12 % MT SOLN
OROMUCOSAL | Status: AC
  Filled 2023-04-28: qty 15

## 2023-04-28 MED ORDER — PROPOFOL 10 MG/ML IV BOLUS
INTRAVENOUS | Status: AC
  Filled 2023-04-28: qty 20

## 2023-04-28 MED ORDER — DEXAMETHASONE SODIUM PHOSPHATE 10 MG/ML IJ SOLN
INTRAMUSCULAR | Status: DC | PRN
  Administered 2023-04-28: 10 mg via INTRAVENOUS

## 2023-04-28 MED ORDER — CEFAZOLIN SODIUM-DEXTROSE 2-4 GM/100ML-% IV SOLN
INTRAVENOUS | Status: AC
  Filled 2023-04-28: qty 100

## 2023-04-28 MED ORDER — SUCCINYLCHOLINE CHLORIDE 200 MG/10ML IV SOSY
PREFILLED_SYRINGE | INTRAVENOUS | Status: AC
  Filled 2023-04-28: qty 10

## 2023-04-28 MED ORDER — DOCUSATE SODIUM 100 MG PO CAPS
100.0000 mg | ORAL_CAPSULE | Freq: Two times a day (BID) | ORAL | 0 refills | Status: DC
  Filled 2023-04-28: qty 10, 5d supply, fill #0

## 2023-04-28 MED ORDER — 0.9 % SODIUM CHLORIDE (POUR BTL) OPTIME
TOPICAL | Status: DC | PRN
  Administered 2023-04-28: 200 mL

## 2023-04-28 MED ORDER — FENTANYL CITRATE (PF) 100 MCG/2ML IJ SOLN: 25.0000 ug | INTRAMUSCULAR | Status: DC | PRN

## 2023-04-28 SURGICAL SUPPLY — 21 items
BNDG ADH 1X3 SHEER STRL LF (GAUZE/BANDAGES/DRESSINGS) ×1 IMPLANT
BNDG ADH 2 X3.75 FABRIC TAN LF (GAUZE/BANDAGES/DRESSINGS) IMPLANT
BRUSH SCRUB EZ 4% CHG (MISCELLANEOUS) ×1 IMPLANT
CHLORAPREP W/TINT 26 (MISCELLANEOUS) ×1 IMPLANT
COVER PROBE FLX POLY STRL (MISCELLANEOUS) ×1 IMPLANT
DERMABOND ADVANCED .7 DNX12 (GAUZE/BANDAGES/DRESSINGS) ×1 IMPLANT
DRAPE EXTREMITY 106X87X128.5 (DRAPES) ×1 IMPLANT
DRAPE IMP U-DRAPE 54X76 (DRAPES) ×1 IMPLANT
DRAPE SHEET LG 3/4 BI-LAMINATE (DRAPES) ×1 IMPLANT
GLOVE BIOGEL PI IND STRL 8 (GLOVE) ×1 IMPLANT
GLOVE SRG 8 PF TXTR STRL LF DI (GLOVE) ×1 IMPLANT
GLOVE SURG SYN 7.5 E (GLOVE) ×1 IMPLANT
GLOVE SURG SYN 7.5 PF PI (GLOVE) ×1 IMPLANT
GOWN SRG XL LVL 3 NONREINFORCE (GOWNS) ×1 IMPLANT
KIT TURNOVER KIT A (KITS) ×1 IMPLANT
NDL HYPO 25X1 1.5 SAFETY (NEEDLE) ×1 IMPLANT
NEEDLE HYPO 25X1 1.5 SAFETY (NEEDLE) ×1 IMPLANT
NS IRRIG 500ML POUR BTL (IV SOLUTION) ×1 IMPLANT
PACK BASIN MINOR ARMC (MISCELLANEOUS) ×1 IMPLANT
STOCKINETTE IMPERVIOUS 9X36 MD (GAUZE/BANDAGES/DRESSINGS) ×1 IMPLANT
ULTRAGLIDE CTR (BLADE) ×1 IMPLANT

## 2023-04-28 NOTE — Discharge Instructions (Signed)
  Avoid lifting objects heavier than 10 pounds (gallon milk jug).  Where possible, avoid household activities that involve lifting, bending, reaching, pushing, or pulling such as laundry, vacuuming, grocery shopping, and childcare. Try to arrange for help from friends and family for these activities while your back heals.  You should not drive until you are no longer taking narcotic pain medication and you feel safe to operate a moving vehicle.  If you are taking narcotic pain medication, please take a stool softener and laxative to avoid constipation.   You should rest at home and let your body heal.    3 days post-op you may remove the rest of your bandage.  You may shower three days after your surgery.  After showering, lightly dab your incision dry. Do not take a tub bath or go swimming until approved by your doctor at your follow-up appointment.  If you smoke, we strongly recommend that you quit.  Smoking has been proven to interfere with wound healing will dramatically reduce the success rate of your surgery. Please contact [facility] (800-QUIT-NOW) and use the resources at www.[facility].com for assistance in stopping smoking.  Surgical Incision    The steri-strips/glue should begin to peel away within about a week. Diet           You may return to your usual diet. Be sure to stay hydrated.  When to Contact us  Contact us immediately if you have any: New numbness or weakness Pain that is progressively getting worse, and is not relieved by your pain medication, muscle relaxers, rest, and warm compresses Bleeding, redness, swelling, pain, or drainage from surgical incision Chills or flu-like symptoms Fever greater than 101.0 F (38.3 C) Inability to eat, drink fluids, or take medications Problems with bowel or bladder functions Difficulty breathing or shortness of breath Warmth, tenderness, or swelling in your calf Contact Information How to contact us:  If you have any  questions/concerns before or after surgery, you can reach [place] at [phone], or you can send a mychart message. We can be reached by phone or mychart 8am-4pm, Monday-Friday.  *Please note: Calls after 4pm are forwarded to a third party answering service. Mychart messages are not routinely monitored during evenings, weekends, and holidays. Please call our office to contact the answering service for urgent concerns during non-business hours.

## 2023-04-28 NOTE — Transfer of Care (Signed)
 Immediate Anesthesia Transfer of Care Note  Patient: [name]  Procedure(s) Performed: CARPAL TUNNEL RELEASE (Right)  Patient Location: PACU  Anesthesia Type:MAC  Level of Consciousness: drowsy  Airway & Oxygen Therapy: Patient Spontanous Breathing and Patient connected to face mask oxygen  Post-op Assessment: Report given to RN and Post -op Vital signs reviewed and stable  Post vital signs: Reviewed and stable  Last Vitals:  Vitals Value Taken Time  BP 95/50 0743  Temp 35.8 0743  Pulse 63 0743  Resp 16 04/28/23 0743  SpO2 100 0743  Vitals shown include unfiled device data.  Last Pain:  Vitals:   04/28/23 0624  TempSrc: Temporal  PainSc: 0-No pain         Complications: No notable events documented.

## 2023-04-28 NOTE — Anesthesia Preprocedure Evaluation (Signed)
 Anesthesia Evaluation  Patient identified by MRN, date of birth, ID band Patient awake    Reviewed: Allergy & Precautions, NPO status , Patient's Chart, lab work & pertinent test results  History of Anesthesia Complications (+) PONV and history of anesthetic complications  Airway Mallampati: III  TM Distance: >3 FB Neck ROM: Full    Dental  (+) Teeth Intact   Pulmonary neg pulmonary ROS   Pulmonary exam normal breath sounds clear to auscultation       Cardiovascular Exercise Tolerance: Good hypertension, Pt. on medications negative cardio ROS Normal cardiovascular exam Rhythm:Regular Rate:Normal     Neuro/Psych  Headaches PSYCHIATRIC DISORDERS  Depression    negative neurological ROS     GI/Hepatic negative GI ROS, Neg liver ROS,GERD  Medicated,,  Endo/Other  negative endocrine ROSdiabetes, Type 2    Renal/GU Renal disease     Musculoskeletal   Abdominal   Peds  Hematology negative hematology ROS (+)   Anesthesia Other Findings Past Medical History: No date: B12 deficiency 04/08/2011: Chronic ischemic colitis, enteritis, or enterocolitis (HCC)     Comment:  S/p right colectomy May 2013, [place] No date: Clotting disorder (HCC) No date: Depression No date: Diabetes mellitus without complication (HCC)     Comment:  pre diabetic 05/15/2020: Fire ant bite No date: GERD (gastroesophageal reflux disease) 2001: H/O toxic shock syndrome     Comment:  post urologic procedure for stone removal No date: Headache     Comment:  migraines Sept 2011: History of pulmonary embolus (PE)     Comment:  secondary to Protein c& S deficiency, Lupus ACA No date: HOH (hard of hearing)     Comment:  wears hearing aids No date: Hyperlipidemia No date: Hypertension 2012-2013: Ischemic colitis [facility])     Comment:  s/p left colectomy May 2013 No date: Ischemic colitis (HCC) No date: Kidney stones No date: PONV (postoperative nausea and  vomiting)  Past Surgical History: 1996: ABDOMINAL HYSTERECTOMY No date: APPENDECTOMY No date: bilateral tubal ligation 1989: BREAST EXCISIONAL BIOPSY; Left     Comment:  benign 1989: BREAST SURGERY     Comment:  left biopsy, normal 05/30/2019: CATARACT EXTRACTION W/PHACO; Right     Comment:  Procedure: CATARACT EXTRACTION PHACO AND INTRAOCULAR               LENS PLACEMENT (IOC) RIGHT DIABETIC;  Surgeon: Rosa College, MD;  Location: [facility];                Service: Ophthalmology;  Laterality: Right;  3.84 0:37.1 06/20/2019: CATARACT EXTRACTION W/PHACO; Left     Comment:  Procedure: CATARACT EXTRACTION PHACO AND INTRAOCULAR               LENS PLACEMENT (IOC) LEFT DIABETIC 2.58  00:29.3;                Surgeon: Rosa College, MD;  Location: [facility]               SURGERY CNTR;  Service: Ophthalmology;  Laterality: Left;              Diabetic May 2013: COLECTOMY; Left     Comment:  Amalia Badder, ischemic colitis 11/16/2014: COLONOSCOPY WITH PROPOFOL ; N/A     Comment:  Procedure: COLONOSCOPY WITH PROPOFOL ;  Surgeon: Cassie Click, MD;  Location: [facility]  ENDOSCOPY;  Service:               Endoscopy;  Laterality: N/A; 11/16/2014: ESOPHAGOGASTRODUODENOSCOPY (EGD) WITH PROPOFOL ; N/A     Comment:  Procedure: ESOPHAGOGASTRODUODENOSCOPY (EGD) WITH               PROPOFOL ;  Surgeon: Cassie Click, MD;  Location: [facility]              ENDOSCOPY;  Service: Endoscopy;  Laterality: N/A; 1984: SPINE SURGERY     Comment:  lumbar diskectomy, [place] No date: ureterolithotomy     Comment:  Dr. Francesco Inks urology ~ 2012 kidney stones in ICU after               surgery  BMI    Body Mass Index: 27.70 kg/m      Reproductive/Obstetrics negative OB ROS                             Anesthesia Physical Anesthesia Plan  ASA: 2  Anesthesia Plan: General   Post-op Pain Management:    Induction: Intravenous  PONV Risk Score and Plan: 3 and  Ondansetron , Dexamethasone , Midazolam , Propofol  infusion and TIVA  Airway Management Planned: Natural Airway and Nasal Cannula  Additional Equipment:   Intra-op Plan:   Post-operative Plan:   Informed Consent: I have reviewed the patients History and Physical, chart, labs and discussed the procedure including the risks, benefits and alternatives for the proposed anesthesia with the patient or authorized representative who has indicated his/her understanding and acceptance.     Dental Advisory Given  Plan Discussed with: Anesthesiologist, CRNA and Surgeon  Anesthesia Plan Comments: (Patient consented for risks of anesthesia including but not limited to:  - adverse reactions to medications - risk of airway placement if required - damage to eyes, teeth, lips or other oral mucosa - nerve damage due to positioning  - sore throat or hoarseness - Damage to heart, brain, nerves, lungs, other parts of body or loss of life  Patient voiced understanding and assent.)       Anesthesia Quick Evaluation

## 2023-04-28 NOTE — Interval H&P Note (Signed)
 History and Physical Interval Note:  04/28/2023 7:01 AM  [name]  has presented today for surgery, with the diagnosis of G56.01 Carpal tunnel syndrome on right.  The various methods of treatment have been discussed with the patient and family. After consideration of risks, benefits and other options for treatment, the patient has consented to  Procedure(s): CARPAL TUNNEL RELEASE (Right) as a surgical intervention.  The patient's history has been reviewed, patient examined, no change in status, stable for surgery.  I have reviewed the patient's chart and labs.  Questions were answered to the patient's satisfaction.    Heart and lungs clear  Carroll Clamp

## 2023-04-28 NOTE — H&P (View-Only) (Signed)
 Referring Physician:  No referring provider defined for this encounter.  Primary Physician:  Emma Flax, MD  History of Present Illness: 04/28/2023 [name] is here today with a chief complaint of right sided hand numbness weakness and tingling.  She states that she has been having difficulty with dropping things, fine motor function, dexterity in her right hand.  She wakes up almost every night with numbness tingling and burning in her hand that she has to shake out.  She did try a wrist brace which has given her some relief but she continues to have significant numbness and tingling.  She states that the sensation loss is now there constantly but the tingling gets worse in certain positions.  Her son has had a history of carpal tunnel as well.  She did not think she would be prone to this as she does not do any repetitive activities.  Sometimes this radiates slightly proximal.  She has had a chronic history of neck pain but nothing radiates down her shoulders and arms to her hands.  This does have some splitting of the ring finger.  Conservative measures:  Physical therapy: has not participated in PT, has tried bracing with some relief Multimodal medical therapy including regular antiinflammatories: Lyrica , Robaxin  Injections: no epidural steroid injections  Past Surgery: Back Surgery in 1984   The symptoms are causing a significant impact on the patient's life.   I have utilized the care everywhere function in epic to review the outside records available from external health systems.  Review of Systems:  A 10 point review of systems is negative, except for the pertinent positives and negatives detailed in the HPI.  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    Aortic atherosclerosis (HCC)    B12 deficiency    Clotting disorder (HCC) 08/08/2009   a.) hypercoagulable workup 08/08/2009: (+) lupus anticoagulant (dPT 61.4; confirm ratio 1.29; dRVVT 49.4); Factor V leiden (-), Factor II (-), Protein C (functional 41%, Ag 46%); Protein S (total 74%, free 77%, functional 58%); ATIII (Ag 80%; activity 80%)   Depression    GERD (gastroesophageal reflux disease)    H/O toxic shock syndrome 2001   post urologic procedure for stone removal   History of bilateral cataract extraction 2021   History of kidney stones    History of pulmonary embolus (PE) 09/2009   a.) found to have (+) 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   HOH (hard of hearing); wears BILATERAL hearing aids    Hyperlipidemia    Hypertension    Ischemic colitis (HCC)    a.) s/p left hemicolectomy 05/2011   Long-term use of aspirin therapy    Migraines    PONV (postoperative nausea and vomiting)    Pre-diabetes     Past Surgical History: Past Surgical History:  Procedure Laterality Date   ABDOMINAL HYSTERECTOMY  1996   APPENDECTOMY     bilateral tubal ligation     BREAST EXCISIONAL BIOPSY Left 1989   benign   BREAST SURGERY  1989   left biopsy, normal   CATARACT EXTRACTION W/PHACO Right 05/30/2019   Procedure: CATARACT EXTRACTION PHACO AND INTRAOCULAR LENS PLACEMENT (IOC) RIGHT DIABETIC;  Surgeon: Rosa College, MD;  Location: [facility];  Service: Ophthalmology;  Laterality: Right;  3.84 0:37.1   CATARACT EXTRACTION W/PHACO Left 06/20/2019   Procedure: CATARACT EXTRACTION PHACO AND INTRAOCULAR LENS PLACEMENT (IOC) LEFT DIABETIC 2.58  00:29.3;  Surgeon: Rosa College, MD;  Location: [facility];  Service: Ophthalmology;  Laterality: Left;  Diabetic   COLECTOMY Left 05/2011   [place], ischemic colitis   COLONOSCOPY WITH PROPOFOL  N/A 11/16/2014   Procedure: COLONOSCOPY WITH PROPOFOL ;  Surgeon: Cassie Click, MD;  Location: [facility] ENDOSCOPY;   Service: Endoscopy;  Laterality: N/A;   COLONOSCOPY WITH PROPOFOL  N/A 07/12/2021   Procedure: COLONOSCOPY WITH PROPOFOL ;  Surgeon: Shane Darling, MD;  Location: [facility] ENDOSCOPY;  Service: Endoscopy;  Laterality: N/A;   ESOPHAGOGASTRODUODENOSCOPY (EGD) WITH PROPOFOL  N/A 11/16/2014   Procedure: ESOPHAGOGASTRODUODENOSCOPY (EGD) WITH PROPOFOL ;  Surgeon: Cassie Click, MD;  Location: [facility] ENDOSCOPY;  Service: Endoscopy;  Laterality: N/A;   ESOPHAGOGASTRODUODENOSCOPY (EGD) WITH PROPOFOL  N/A 07/12/2021   Procedure: ESOPHAGOGASTRODUODENOSCOPY (EGD) WITH PROPOFOL ;  Surgeon: Shane Darling, MD;  Location: [facility] ENDOSCOPY;  Service: Endoscopy;  Laterality: N/A;   IVC FILTER INSERTION     temporary   SPINE SURGERY  1984   lumbar diskectomy, [place]   ureterolithotomy     Dr. Francesco Inks urology ~ 2012 kidney stones in ICU after surgery    Allergies: Allergies as of 04/13/2023 - Review Complete 03/02/2023  Allergen Reaction Noted   Sulfamethoxazole -trimethoprim  Other (See Comments) 11/20/2022    Medications:  Current Facility-Administered Medications:    ceFAZolin  (ANCEF ) IVPB 2g/100 mL premix, 2 g, Intravenous, 60 min Pre-Op, Felipe Horton, Cydne Doyne, MD   lactated ringers  infusion, , Intravenous, Continuous, Welton Hall, Alston Jerry, MD  Social History: Social History   Tobacco Use   Smoking status: Never   Smokeless tobacco: Never  Vaping Use   Vaping status: Never Used  Substance Use Topics   Alcohol use: No   Drug use: No    Family Medical History: Family History  Problem Relation Age of Onset   Hypertension Mother    Cancer Mother    Diabetes Mother    Congestive Heart Failure Mother    Heart disease Father    Hypertension Father    Diabetes Father    Alzheimer's disease Sister    Breast cancer Sister 31       DCIS   Breast cancer Paternal Aunt        <50   Breast cancer Cousin     Physical Examination: Vitals:   04/28/23 0624  BP: (!) 148/82  Pulse: 78  Resp: 16  Temp:  97.6 F (36.4 C)  SpO2: 96%    General: Patient is in no apparent distress. Attention to examination is appropriate.  Neck:   Supple.  Full range of motion.  Respiratory: Patient is breathing without any difficulty.   NEUROLOGICAL:     Awake, alert, oriented to person, place, and time.  Speech is clear and fluent.   Cranial Nerves: Pupils equal round and reactive to light.  Facial tone is symmetric.  Facial sensation is symmetric. Shoulder shrug is symmetric. Tongue protrusion is midline.    Strength: She has some thenar wasting noted, slight weakness in her median innervated musculature, she has preservation of her ulnar innervated lumbricals as well as her C6 myotome.  She has positive carpal compression test, positive Phalen's and reverse Phalen's, positive Tinel sign at the transverse carpal ligament.  Decreased in the median nerve distribution, splitting of the ring finger     Imaging: I reviewed her cervical MRI shows some multilevel cervical spondylosis,  most of this however is off the left side,  I have personally reviewed the images and agree with the above interpretation.   [facility]  [address] [place], [address]  [place], [place] [address] Tel: [phone] Fax: [phone] Test Date:  04/06/2023   Patient: [name] DOB: May 20, 1950 Physician: Rommie Coats, MD  Sex: Female Height: 5\' 1"  Ref Phys: Henderson Lock, MD  ID#: [id number]     Technician:      History: This is a 73 year old female with right hand numbness and tingling and weakness.   NCV & EMG Findings: Extensive electrodiagnostic evaluation of the right upper limb shows: Right median sensory response is absent. Right ulnar and radial sensory responses are within normal limits. Right median (APB) motor response shows prolonged distal onset latency (6.1 ms). Right ulnar (ADM) motor response is within normal limits. There is no evidence of active or chronic motor axon loss changes affecting  any of the tested muscles on needle examination. Motor unit configuration and recruitment pattern is within normal limits.   Neuromuscular Ultrasound Findings: High frequency (4.0-16.0 MHz) B-mode, nonvascular ultrasound of the right upper limb shows: Cross sectional areas (CSA) of right median (palm to forearm) nerve shows increased CSA at the wrist (13.7 mm2). Wrist to forearm ratio of right median nerve is increased (2.28). No other obvious lesion involving the adjacent bone or tendon is identified. No definite vascular abnormalities.   Impression: This is an abnormal study. The findings are most consistent with the following: Evidence of a right median mononeuropathy at or distal to the wrist, consistent with carpal tunnel syndrome, severe in degree electrically. While there is right median nerve swelling at the wrist, there is no underlying compressive lesion on neuromuscular ultrasound. No electrodiagnostic evidence of a right cervical (C5-T1) motor radiculopathy. Screening studies for right ulnar and radial mononeuropathies are normal.     ___________________________ Rommie Coats, MD  Medical Decision Making/Assessment and Plan: Ms. Davisson is a pleasant 73 y.o. female with right sided hand numbness tingling and weakness.  She gets mostly nighttime symptoms and has had persistent numbness and tingling in her hand on the right.  This is mostly in the median distribution with splitting of her ring finger.  She has weakness in her median intrinsic musculature, she has sparing of her ulnar lumbricals and her C6 myotome.  She has positive carpal tunnel provocative measures and maneuvers.  Her symptoms are consistent with carpal tunnel syndrome, her edx testing supports severe carpal tunnel syndrome.  I also discussed with her that given the fact that her sensory loss is persistent that the sensory loss itself may not recover, however most people's paresthesias or tingling symptoms get better and  improve after a carpal tunnel release.  It often also helps with strength recovery.  Will plan for a carpal tunnel release with ultrasound guidance.  Thank you for involving me in the care of this patient.    Carroll Clamp MD/MSCR Neurosurgery

## 2023-04-28 NOTE — Anesthesia Postprocedure Evaluation (Signed)
 Anesthesia Post Note  Patient: [name]  Procedure(s) Performed: CARPAL TUNNEL RELEASE (Right)  Patient location during evaluation: PACU Anesthesia Type: General Level of consciousness: awake and alert Pain management: pain level controlled Vital Signs Assessment: post-procedure vital signs reviewed and stable Respiratory status: spontaneous breathing, nonlabored ventilation, respiratory function stable and patient connected to nasal cannula oxygen Cardiovascular status: blood pressure returned to baseline and stable Postop Assessment: no apparent nausea or vomiting Anesthetic complications: no  No notable events documented.   Last Vitals:  Vitals:   04/28/23 0815 04/28/23 0825  BP: 109/61   Pulse: 67 71  Resp: 18 15  Temp: (!) 36.3 C   SpO2: 97% 98%    Last Pain:  Vitals:   04/28/23 0825  TempSrc:   PainSc: 2                  Enrique Harvest

## 2023-04-28 NOTE — Op Note (Signed)
 Indications: Patient with a history of median neuropathy at the wrist with hand weakness refractory to conservative management.  Findings: Severe compression of the median nerve at the transverse carpal ligament  Preoperative Diagnosis:G56.01 Carpal tunnel syndrome on right  Postoperative Diagnosis: G56.01 Carpal tunnel syndrome on right   Postoperative Diagnosis: same   EBL: Minimal IVF: See anesthesia report Drains: none Disposition:Stable to PACU Complications: none  No foley catheter was placed.   Preoperative Note: patient with a history of progressive right median neuropathy with hand weakness refractory to conservative management.  They had tried rest, padding, and watchful waiting but had continued progressive symptoms.  Given the progression of her median neuropathy plan was made for median nerve decompression  Risk of surgery is discussed and include: Infection, bleeding, wound healing issues, pillar pain nerve injury, pain, failure to relieve the symptoms, need for further surgery.  Procedure:  1) right sided carpal tunnel decompression with ultrasound guidance   Procedure: After obtaining informed consent, the patient taken to the operating room, placed in supine position, monitored anesthesia care was induced.  They were given preoperative antibiotics.  Prepped and draped in the usual fashion.  Comprehensive timeout was performed verifying the patient's name, MRN, planned procedure.  An ultrasound was used with a sterile probe.  We used this to mark out our safety points including the interval between the ulnar artery and median nerve.  We identified the motor branch as well as the first sensory branch which were both in safe position.  We also identified the vascular arcade which was in safe positioning as well.  At this point we placed a block with Marcaine without epinephrine .  We blocked the skin where the incision would be, the superficial sensory median nerve, as  well as the transverse carpal ligament.  Under ultrasound guidance we utilized the local anesthetic to perform a hydrodissection of the nerve from the transverse carpal ligament.  We then prepped the sonexs ultra CTR knife on the back table while the anesthetic set in.  We performed a small linear incision approximately 2 to 3 mm.  We then utilized a Statistician under ultrasound guidance to identify the underside of the transverse carpal ligament.  The fat and connective tissue was dissected off the underside.  We could feel that this was quite thickened and calcified.  Causing severe compression.  Once we had a clear tract we then placed the ultrasound-guided knife into the incision and advanced it through the carpal tunnel.  We verified the safety zones including the median nerve which did not significantly cross over the knife.  We are able to see the first sensory branch which was not crossing the knife.  The artery was also in a safe place.  At this point we divided the transverse carpal ligament under ultrasound guidance, to get a full release it took 2 passes.  The nerve relaxed laterally and was well decompressed.  After the 2 passes we placed a [place] 4 into the wound and were able to feel a complete dissection of the transverse carpal ligament.  We then irrigated, we got meticulous hemostasis.  Skin glue was placed on the incision and a Band-Aid was placed on top once this was dried.  No immediate complications.  Sponge and pattie counts were correct at the end of the procedure.   I performed the procedure without an assistant surgeon  Carroll Clamp, MD/MSCR

## 2023-04-28 NOTE — Progress Notes (Signed)
 Referring Physician:  No referring provider defined for this encounter.  Primary Physician:  Thersia Flax, MD  History of Present Illness: 04/28/2023 [name] is here today with a chief complaint of right sided hand numbness weakness and tingling.  She states that she has been having difficulty with dropping things, fine motor function, dexterity in her right hand.  She wakes up almost every night with numbness tingling and burning in her hand that she has to shake out.  She did try a wrist brace which has given her some relief but she continues to have significant numbness and tingling.  She states that the sensation loss is now there constantly but the tingling gets worse in certain positions.  Her son has had a history of carpal tunnel as well.  She did not think she would be prone to this as she does not do any repetitive activities.  Sometimes this radiates slightly proximal.  She has had a chronic history of neck pain but nothing radiates down her shoulders and arms to her hands.  This does have some splitting of the ring finger.  Conservative measures:  Physical therapy: has not participated in PT, has tried bracing with some relief Multimodal medical therapy including regular antiinflammatories: Lyrica , Robaxin  Injections: no epidural steroid injections  Past Surgery: Back Surgery in 1984   The symptoms are causing a significant impact on the patient's life.   I have utilized the care everywhere function in epic to review the outside records available from external health systems.  Review of Systems:  A 10 point review of systems is negative, except for the pertinent positives and negatives detailed in the HPI.  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    Aortic atherosclerosis (HCC)    B12 deficiency    Clotting disorder (HCC) 08/08/2009   a.) hypercoagulable workup 08/08/2009: (+) lupus anticoagulant (dPT 61.4; confirm ratio 1.29; dRVVT 49.4); Factor V leiden (-), Factor II (-), Protein C (functional 41%, Ag 46%); Protein S (total 74%, free 77%, functional 58%); ATIII (Ag 80%; activity 80%)   Depression    GERD (gastroesophageal reflux disease)    H/O toxic shock syndrome 2001   post urologic procedure for stone removal   History of bilateral cataract extraction 2021   History of kidney stones    History of pulmonary embolus (PE) 09/2009   a.) found to have (+) 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   HOH (hard of hearing); wears BILATERAL hearing aids    Hyperlipidemia    Hypertension    Ischemic colitis (HCC)    a.) s/p left hemicolectomy 05/2011   Long-term use of aspirin therapy    Migraines    PONV (postoperative nausea and vomiting)    Pre-diabetes     Past Surgical History: Past Surgical History:  Procedure Laterality Date   ABDOMINAL HYSTERECTOMY  1996   APPENDECTOMY     bilateral tubal ligation     BREAST EXCISIONAL BIOPSY Left 1989   benign   BREAST SURGERY  1989   left biopsy, normal   CATARACT EXTRACTION W/PHACO Right 05/30/2019   Procedure: CATARACT EXTRACTION PHACO AND INTRAOCULAR LENS PLACEMENT (IOC) RIGHT DIABETIC;  Surgeon: Rosa College, MD;  Location: [facility];  Service: Ophthalmology;  Laterality: Right;  3.84 0:37.1   CATARACT EXTRACTION W/PHACO Left 06/20/2019   Procedure: CATARACT EXTRACTION PHACO AND INTRAOCULAR LENS PLACEMENT (IOC) LEFT DIABETIC 2.58  00:29.3;  Surgeon: Rosa College, MD;  Location: [facility];  Service: Ophthalmology;  Laterality: Left;  Diabetic   COLECTOMY Left 05/2011   [place], ischemic colitis   COLONOSCOPY WITH PROPOFOL  N/A 11/16/2014   Procedure: COLONOSCOPY WITH PROPOFOL ;  Surgeon: Cassie Click, MD;  Location: [facility];   Service: Endoscopy;  Laterality: N/A;   COLONOSCOPY WITH PROPOFOL  N/A 07/12/2021   Procedure: COLONOSCOPY WITH PROPOFOL ;  Surgeon: Shane Darling, MD;  Location: [facility] ENDOSCOPY;  Service: Endoscopy;  Laterality: N/A;   ESOPHAGOGASTRODUODENOSCOPY (EGD) WITH PROPOFOL  N/A 11/16/2014   Procedure: ESOPHAGOGASTRODUODENOSCOPY (EGD) WITH PROPOFOL ;  Surgeon: Cassie Click, MD;  Location: [facility] ENDOSCOPY;  Service: Endoscopy;  Laterality: N/A;   ESOPHAGOGASTRODUODENOSCOPY (EGD) WITH PROPOFOL  N/A 07/12/2021   Procedure: ESOPHAGOGASTRODUODENOSCOPY (EGD) WITH PROPOFOL ;  Surgeon: Shane Darling, MD;  Location: [facility] ENDOSCOPY;  Service: Endoscopy;  Laterality: N/A;   IVC FILTER INSERTION     temporary   SPINE SURGERY  1984   lumbar diskectomy, [place]   ureterolithotomy     Dr. Francesco Inks urology ~ 2012 kidney stones in ICU after surgery    Allergies: Allergies as of 04/13/2023 - Review Complete 03/02/2023  Allergen Reaction Noted   Sulfamethoxazole -trimethoprim  Other (See Comments) 11/20/2022    Medications:  Current Facility-Administered Medications:    ceFAZolin  (ANCEF ) IVPB 2g/100 mL premix, 2 g, Intravenous, 60 min Pre-Op, Felipe Horton, Cydne Doyne, MD   lactated ringers  infusion, , Intravenous, Continuous, Welton Hall, Alston Jerry, MD  Social History: Social History   Tobacco Use   Smoking status: Never   Smokeless tobacco: Never  Vaping Use   Vaping status: Never Used  Substance Use Topics   Alcohol use: No   Drug use: No    Family Medical History: Family History  Problem Relation Age of Onset   Hypertension Mother    Cancer Mother    Diabetes Mother    Congestive Heart Failure Mother    Heart disease Father    Hypertension Father    Diabetes Father    Alzheimer's disease Sister    Breast cancer Sister 31       DCIS   Breast cancer Paternal Aunt        <50   Breast cancer Cousin     Physical Examination: Vitals:   04/28/23 0624  BP: (!) 148/82  Pulse: 78  Resp: 16  Temp:  97.6 F (36.4 C)  SpO2: 96%    General: Patient is in no apparent distress. Attention to examination is appropriate.  Neck:   Supple.  Full range of motion.  Respiratory: Patient is breathing without any difficulty.   NEUROLOGICAL:     Awake, alert, oriented to person, place, and time.  Speech is clear and fluent.   Cranial Nerves: Pupils equal round and reactive to light.  Facial tone is symmetric.  Facial sensation is symmetric. Shoulder shrug is symmetric. Tongue protrusion is midline.    Strength: She has some thenar wasting noted, slight weakness in her median innervated musculature, she has preservation of her ulnar innervated lumbricals as well as her C6 myotome.  She has positive carpal compression test, positive Phalen's and reverse Phalen's, positive Tinel sign at the transverse carpal ligament.  Decreased in the median nerve distribution, splitting of the ring finger     Imaging: I reviewed her cervical MRI shows some multilevel cervical spondylosis,  most of this however is off the left side,  I have personally reviewed the images and agree with the above interpretation.   [facility]  [address] [place], [address]  [place], [place] [address] Tel: [phone] Fax: [phone] Test Date:  04/06/2023   Patient: [name] DOB: May 20, 1950 Physician: Rommie Coats, MD  Sex: Female Height: 5\' 1"  Ref Phys: Henderson Lock, MD  ID#: [id number]     Technician:      History: This is a 73 year old female with right hand numbness and tingling and weakness.   NCV & EMG Findings: Extensive electrodiagnostic evaluation of the right upper limb shows: Right median sensory response is absent. Right ulnar and radial sensory responses are within normal limits. Right median (APB) motor response shows prolonged distal onset latency (6.1 ms). Right ulnar (ADM) motor response is within normal limits. There is no evidence of active or chronic motor axon loss changes affecting  any of the tested muscles on needle examination. Motor unit configuration and recruitment pattern is within normal limits.   Neuromuscular Ultrasound Findings: High frequency (4.0-16.0 MHz) B-mode, nonvascular ultrasound of the right upper limb shows: Cross sectional areas (CSA) of right median (palm to forearm) nerve shows increased CSA at the wrist (13.7 mm2). Wrist to forearm ratio of right median nerve is increased (2.28). No other obvious lesion involving the adjacent bone or tendon is identified. No definite vascular abnormalities.   Impression: This is an abnormal study. The findings are most consistent with the following: Evidence of a right median mononeuropathy at or distal to the wrist, consistent with carpal tunnel syndrome, severe in degree electrically. While there is right median nerve swelling at the wrist, there is no underlying compressive lesion on neuromuscular ultrasound. No electrodiagnostic evidence of a right cervical (C5-T1) motor radiculopathy. Screening studies for right ulnar and radial mononeuropathies are normal.     ___________________________ Rommie Coats, MD  Medical Decision Making/Assessment and Plan: Ms. Davisson is a pleasant 73 y.o. female with right sided hand numbness tingling and weakness.  She gets mostly nighttime symptoms and has had persistent numbness and tingling in her hand on the right.  This is mostly in the median distribution with splitting of her ring finger.  She has weakness in her median intrinsic musculature, she has sparing of her ulnar lumbricals and her C6 myotome.  She has positive carpal tunnel provocative measures and maneuvers.  Her symptoms are consistent with carpal tunnel syndrome, her edx testing supports severe carpal tunnel syndrome.  I also discussed with her that given the fact that her sensory loss is persistent that the sensory loss itself may not recover, however most people's paresthesias or tingling symptoms get better and  improve after a carpal tunnel release.  It often also helps with strength recovery.  Will plan for a carpal tunnel release with ultrasound guidance.  Thank you for involving me in the care of this patient.    Carroll Clamp MD/MSCR Neurosurgery

## 2023-04-29 ENCOUNTER — Encounter: Payer: Self-pay | Admitting: Neurosurgery

## 2023-04-29 ENCOUNTER — Other Ambulatory Visit: Payer: Self-pay | Admitting: Internal Medicine

## 2023-04-29 MED ORDER — METOPROLOL SUCCINATE ER 100 MG PO TB24
100.0000 mg | ORAL_TABLET | Freq: Every day | ORAL | 1 refills | Status: DC
  Filled 2023-04-29: qty 90, 90d supply, fill #0
  Filled 2023-07-29: qty 90, 90d supply, fill #1

## 2023-04-29 MED ORDER — METHOCARBAMOL 500 MG PO TABS
500.0000 mg | ORAL_TABLET | Freq: Every day | ORAL | 1 refills | Status: DC
  Filled 2023-04-29: qty 90, 90d supply, fill #0
  Filled 2023-07-29: qty 90, 90d supply, fill #1

## 2023-04-30 ENCOUNTER — Other Ambulatory Visit: Payer: Self-pay

## 2023-05-02 ENCOUNTER — Other Ambulatory Visit: Payer: Self-pay | Admitting: Internal Medicine

## 2023-05-10 ENCOUNTER — Other Ambulatory Visit: Payer: Self-pay | Admitting: Internal Medicine

## 2023-05-13 ENCOUNTER — Ambulatory Visit (INDEPENDENT_AMBULATORY_CARE_PROVIDER_SITE_OTHER): Admitting: Physician Assistant

## 2023-05-13 VITALS — BP 124/70 | Temp 98.8°F | Ht 61.0 in | Wt 160.0 lb

## 2023-05-13 DIAGNOSIS — Z9889 Other specified postprocedural states: Secondary | ICD-10-CM

## 2023-05-13 DIAGNOSIS — G5601 Carpal tunnel syndrome, right upper limb: Secondary | ICD-10-CM

## 2023-05-13 DIAGNOSIS — Z09 Encounter for follow-up examination after completed treatment for conditions other than malignant neoplasm: Secondary | ICD-10-CM

## 2023-05-13 NOTE — Progress Notes (Signed)
   REFERRING PHYSICIAN:  Thersia Flax, Md [address] [place],  [place] [address]  DOS: 04/28/23, Right US  guided carpal tunnel release  HISTORY OF PRESENT ILLNESS: [name] is 2 weeks status post right Sagardia carpal tunnel release. Overall, she is doing very well.  She states she no longer has nighttime waking.  Her numbness and tingling is gone and she is very happy with her result.  PHYSICAL EXAMINATION:  NEUROLOGICAL:  General: In no acute distress.   Awake, alert, oriented to person, place, and time.  Pupils equal round and reactive to light.  Facial tone is symmetric.    Strength: Side Biceps Triceps Deltoid Interossei Grip Wrist Ext. Wrist Flex.  R 5 5 5 5 5 5 5   L 5 5 5 5 5 5 5    Incision c/d/I  Imaging:  No new imaging  Assessment / Plan: [name] is doing well after ultrasound-guided carpal tunnel release on 04/28/2023.  I am pleased that she is doing so well and her symptoms are gone.  She does have some slight pillar pain, but she states that it does not bother her.  Advised patient to contact us  if this were to worsen or continue and she would like to try occupational hand therapy in the future.  At this time is not needed.  Plan to follow-up at her 6-week postop appointment.   Advised to contact the office if any questions or concerns arise.   Ludwig Safer PA-C Dept of Neurosurgery

## 2023-05-14 ENCOUNTER — Telehealth: Payer: Self-pay | Admitting: Internal Medicine

## 2023-05-14 ENCOUNTER — Other Ambulatory Visit

## 2023-05-14 DIAGNOSIS — Z7901 Long term (current) use of anticoagulants: Secondary | ICD-10-CM

## 2023-05-14 LAB — PROTIME-INR
INR: 2.8 ratio — ABNORMAL HIGH (ref 0.8–1.0)
Prothrombin Time: 28.8 s — ABNORMAL HIGH (ref 9.6–13.1)

## 2023-05-14 NOTE — Telephone Encounter (Signed)
 Patient need lab order for PT/INR

## 2023-05-14 NOTE — Telephone Encounter (Signed)
 [name]

## 2023-05-15 ENCOUNTER — Encounter: Payer: Self-pay | Admitting: Internal Medicine

## 2023-05-15 NOTE — Addendum Note (Signed)
 Addended by: Thersia Flax on: 05/15/2023 10:25 PM   Modules accepted: Orders

## 2023-06-03 ENCOUNTER — Ambulatory Visit

## 2023-06-07 ENCOUNTER — Other Ambulatory Visit: Payer: Self-pay | Admitting: Internal Medicine

## 2023-06-08 ENCOUNTER — Encounter: Payer: Self-pay | Admitting: Neurosurgery

## 2023-06-08 ENCOUNTER — Ambulatory Visit (INDEPENDENT_AMBULATORY_CARE_PROVIDER_SITE_OTHER): Admitting: Neurosurgery

## 2023-06-08 VITALS — BP 104/58 | Temp 98.1°F | Ht 61.0 in | Wt 160.0 lb

## 2023-06-08 DIAGNOSIS — Z9889 Other specified postprocedural states: Secondary | ICD-10-CM | POA: Insufficient documentation

## 2023-06-08 NOTE — Progress Notes (Signed)
   REFERRING PHYSICIAN:  Thersia Flax, Md [address] [place],  [place] [address]  DOS: 04/28/23, Right US  guided carpal tunnel release  HISTORY OF PRESENT ILLNESS: [name] is 6 weeks status post right  carpal tunnel release. Overall, she is doing very well.  She states she no longer has nighttime waking.   PHYSICAL EXAMINATION:  NEUROLOGICAL:  General: In no acute distress.   Awake, alert, oriented to person, place, and time.  Pupils equal round and reactive to light.  Facial tone is symmetric.    Strength: Side Biceps Triceps Deltoid Interossei Grip Wrist Ext. Wrist Flex.  R 5 5 5 5 5 5 5   L 5 5 5 5 5 5 5    Incision c/d/I  Imaging:  No new imaging  Assessment / Plan: [name] is doing well after ultrasound-guided carpal tunnel release on 04/28/2023.  I am pleased that she is doing so well and her symptoms are gone. Her pillar pain has improved. Overall she's very pleased with her outcome.   Advised to contact the office if any questions or concerns arise.   Carroll Clamp MD Dept of Neurosurgery

## 2023-06-15 ENCOUNTER — Other Ambulatory Visit: Payer: Self-pay | Admitting: Internal Medicine

## 2023-07-07 ENCOUNTER — Other Ambulatory Visit

## 2023-07-07 ENCOUNTER — Other Ambulatory Visit: Payer: Self-pay

## 2023-07-07 ENCOUNTER — Ambulatory Visit: Payer: Self-pay | Admitting: Internal Medicine

## 2023-07-07 DIAGNOSIS — Z7901 Long term (current) use of anticoagulants: Secondary | ICD-10-CM

## 2023-07-07 LAB — PROTIME-INR
INR: 4 ratio — ABNORMAL HIGH (ref 0.8–1.0)
Prothrombin Time: 39.9 s — ABNORMAL HIGH (ref 9.6–13.1)

## 2023-07-07 MED ORDER — PREGABALIN 75 MG PO CAPS: 75.0000 mg | ORAL_CAPSULE | Freq: Two times a day (BID) | ORAL | 1 refills | Status: DC

## 2023-07-07 MED ORDER — WARFARIN SODIUM 5 MG PO TABS: ORAL_TABLET | ORAL | 1 refills | Status: DC

## 2023-07-07 NOTE — Telephone Encounter (Signed)
 Refilled: 02/20/2023 Last OV: 02/20/2023 Next OV: 07/14/2023

## 2023-07-08 ENCOUNTER — Telehealth: Payer: Self-pay | Admitting: Internal Medicine

## 2023-07-08 NOTE — Telephone Encounter (Unsigned)
 Copied from CRM [id number]. Topic: Clinical - Lab/Test Results >> Jul 08, 2023  5:45 PM Gennette ORN wrote: Reason for CRM: Patient called in had a missed call from [facility], patient said she will just call back tomorrow.

## 2023-07-09 NOTE — Telephone Encounter (Signed)
 I let pt know that I called yesterday in regards to the lab result because she had not seen the message yet. However she did finally see it and responded. I let pt know that we would mychart  her back with anything else.

## 2023-07-13 ENCOUNTER — Ambulatory Visit: Payer: PPO | Admitting: Internal Medicine

## 2023-07-14 ENCOUNTER — Ambulatory Visit (INDEPENDENT_AMBULATORY_CARE_PROVIDER_SITE_OTHER): Admitting: Internal Medicine

## 2023-07-14 ENCOUNTER — Encounter: Payer: Self-pay | Admitting: Internal Medicine

## 2023-07-14 ENCOUNTER — Other Ambulatory Visit: Payer: Self-pay | Admitting: Internal Medicine

## 2023-07-14 VITALS — BP 120/64 | HR 57 | Ht 61.0 in | Wt 163.2 lb

## 2023-07-14 DIAGNOSIS — R809 Proteinuria, unspecified: Secondary | ICD-10-CM

## 2023-07-14 DIAGNOSIS — E785 Hyperlipidemia, unspecified: Secondary | ICD-10-CM | POA: Diagnosis not present

## 2023-07-14 DIAGNOSIS — Z7901 Long term (current) use of anticoagulants: Secondary | ICD-10-CM

## 2023-07-14 DIAGNOSIS — E1169 Type 2 diabetes mellitus with other specified complication: Secondary | ICD-10-CM

## 2023-07-14 DIAGNOSIS — E1129 Type 2 diabetes mellitus with other diabetic kidney complication: Secondary | ICD-10-CM

## 2023-07-14 DIAGNOSIS — G5601 Carpal tunnel syndrome, right upper limb: Secondary | ICD-10-CM

## 2023-07-14 DIAGNOSIS — E559 Vitamin D deficiency, unspecified: Secondary | ICD-10-CM | POA: Diagnosis not present

## 2023-07-14 DIAGNOSIS — K591 Functional diarrhea: Secondary | ICD-10-CM

## 2023-07-14 DIAGNOSIS — F4329 Adjustment disorder with other symptoms: Secondary | ICD-10-CM | POA: Diagnosis not present

## 2023-07-14 DIAGNOSIS — Z1231 Encounter for screening mammogram for malignant neoplasm of breast: Secondary | ICD-10-CM

## 2023-07-14 DIAGNOSIS — R7303 Prediabetes: Secondary | ICD-10-CM | POA: Insufficient documentation

## 2023-07-14 DIAGNOSIS — I10 Essential (primary) hypertension: Secondary | ICD-10-CM | POA: Diagnosis not present

## 2023-07-14 MED ORDER — BUPROPION HCL ER (XL) 150 MG PO TB24: 150.0000 mg | ORAL_TABLET | Freq: Every day | ORAL | 1 refills | Status: DC

## 2023-07-14 MED ORDER — FUROSEMIDE 20 MG PO TABS: 20.0000 mg | ORAL_TABLET | ORAL | 1 refills | Status: DC

## 2023-07-14 MED ORDER — AMLODIPINE BESYLATE 5 MG PO TABS
5.0000 mg | ORAL_TABLET | Freq: Every day | ORAL | 1 refills | Status: DC
Start: 2023-07-14 — End: 2023-09-12

## 2023-07-14 MED ORDER — TELMISARTAN 80 MG PO TABS: 80.0000 mg | ORAL_TABLET | Freq: Every day | ORAL | 1 refills | Status: DC

## 2023-07-14 MED ORDER — WARFARIN SODIUM 4 MG PO TABS: 4.0000 mg | ORAL_TABLET | Freq: Every day | ORAL | 2 refills | Status: DC

## 2023-07-14 NOTE — Assessment & Plan Note (Signed)
 I have addressed  BMI and recommended a low glycemic index diet utilizing smaller more frequent meals to increase metabolism.  I have also recommended that patient start exercising with a goal of 30 minutes of aerobic exercise a minimum of 5 days per week.

## 2023-07-14 NOTE — Patient Instructions (Addendum)
 Try taking Benefiber  every  day FOR  30 days to see if the bowel function returns to normal   Return for an INR on July 16    Your annual mammogram has been ordered  and is due now.   [facility] will not allow us  to schedule it for you,  so please  call to make your appointment [phone]

## 2023-07-14 NOTE — Assessment & Plan Note (Signed)
 Alternating with constipation.  Secondary to IBS per colonoscopy/GI evaluation.  Trial of daily benefiber recommended.

## 2023-07-14 NOTE — Progress Notes (Signed)
 Subjective:  Patient ID: [name], female    DOB: 16-Oct-1950  Age: 73 y.o. MRN: [id number]  CC: The primary encounter diagnosis was Hypertension, unspecified type. Diagnoses of Controlled type 2 diabetes mellitus with microalbuminuria, without long-term current use of insulin (HCC), Hyperlipidemia associated with type 2 diabetes mellitus (HCC), Morbid obesity (HCC), Vitamin D  deficiency, Breast cancer screening by mammogram, Prediabetes, Anticoagulation monitoring, INR range 2-3, Carpal tunnel syndrome of right wrist, Grief reaction with prolonged bereavement, and Functional diarrhea were also pertinent to this visit.   HPI KRISLYNN GRONAU presents for  Chief Complaint  Patient presents with   Medical Management of Chronic Issues    6 month follow up    1) Grief /complicated depression :  she remains despondent,  missing her husband. She has deferred treatmet   2) s/p U/S guided right carpal tunnel release in late April by Penne Sharps . Right hand is mostly pain free, no  strength deficits   3) chronic anticoagulation: coumadin  was suspended 5 days prior and resumed 3 days post op eratively  4) chronic constipation : alternates between diarrhea (14 days /month) due to IBS . Tried Linzess,  caused incontinence.  Several sisters have similar issues , not food related. Colonoscopy normal July 2023   5) Hypertension: patient checks blood pressure once  monthly at home.  Readings have been for the most part <130/80 at rest . Patient is following a reduced salt diet most days and is taking medications as prescribed  Outpatient Medications Prior to Visit  Medication Sig Dispense Refill   acetaminophen  (TYLENOL ) 500 MG tablet Take 2 tablets (1,000 mg total) by mouth every 8 (eight) hours as needed. 30 tablet 0   aspirin 81 MG tablet Take 81 mg by mouth daily.     atorvastatin  (LIPITOR) 40 MG tablet Take 1 tablet (40 mg total) by mouth daily. (Patient taking differently: Take 40 mg by mouth at  bedtime.) 90 tablet 3   Cholecalciferol (VITAMIN D3) 1000 UNITS CAPS Take 1 capsule by mouth daily.     cyanocobalamin  (VITAMIN B12) 1000 MCG/ML injection INJECT 1ML INTO THE MUSCLE EVERY 30 DAYS 10 mL 0   diphenhydrAMINE  (BENADRYL ) 25 mg capsule Take 25 mg by mouth every 8 (eight) hours as needed for itching or allergies.     hydrochlorothiazide  (HYDRODIURIL ) 25 MG tablet Take 1 tablet (25 mg total) by mouth daily. 90 tablet 3   ibuprofen (ADVIL) 600 MG tablet Take 600 mg by mouth every 6 (six) hours as needed for moderate pain (pain score 4-6).     methocarbamol  (ROBAXIN ) 500 MG tablet Take 1 tablet (500 mg total) by mouth at bedtime. 90 tablet 1   metoprolol  succinate (TOPROL -XL) 100 MG 24 hr tablet Take 1 tablet (100 mg total) by mouth daily. 90 tablet 1   Omega-3 Fatty Acids (FISH OIL) 1000 MG CAPS Take 1 capsule daily by mouth.     omeprazole  (PRILOSEC) 20 MG capsule Take 1 capsule (20 mg total) by mouth daily.     ondansetron  (ZOFRAN ) 4 MG tablet Take 1 tablet (4 mg total) by mouth every 8 (eight) hours as needed for nausea or vomiting. 20 tablet 0   OVER THE COUNTER MEDICATION Take 1 tablet by mouth daily. Migrerelief     pregabalin  (LYRICA ) 75 MG capsule Take 1 capsule (75 mg total) by mouth 2 (two) times daily. 60 capsule 1   promethazine  (PHENERGAN ) 12.5 MG tablet Take 12.5 mg by mouth every 6 (six)  hours as needed for nausea or vomiting.     warfarin (COUMADIN ) 4 MG tablet Take 1 tablet (4 mg total) by mouth daily. 30 tablet 2   amLODipine  (NORVASC ) 5 MG tablet Take 1 tablet (5 mg total) by mouth daily. 90 tablet 1   buPROPion  (WELLBUTRIN  XL) 150 MG 24 hr tablet Take 1 tablet (150 mg total) by mouth daily. 90 tablet 1   furosemide  (LASIX ) 20 MG tablet Take 1 tablet (20 mg total) by mouth 2 (two) times a week. Tuesday and Friday (Patient taking differently: Take 20 mg by mouth daily.) 30 tablet 1   telmisartan  (MICARDIS ) 80 MG tablet Take 1 tablet (80 mg total) by mouth daily. 90 tablet  1   ARIPiprazole  (ABILIFY ) 2 MG tablet Take 2 mg by mouth daily. (Patient not taking: Reported on 07/14/2023)     docusate sodium  (COLACE) 100 MG capsule Take 1 capsule (100 mg total) by mouth 2 (two) times daily. (Patient not taking: Reported on 07/14/2023) 10 capsule 0   mirtazapine  (REMERON ) 15 MG tablet Take 7.5-15 mg by mouth at bedtime. (Patient not taking: Reported on 07/14/2023)     senna (SENOKOT) 8.6 MG TABS tablet Take 1 tablet (8.6 mg total) by mouth daily. (Patient not taking: Reported on 07/14/2023) 100 tablet 0   traZODone  (DESYREL ) 50 MG tablet Take 50 mg by mouth at bedtime as needed for sleep. (Patient not taking: Reported on 07/14/2023)     Facility-Administered Medications Prior to Visit  Medication Dose Route Frequency Provider Last Rate Last Admin   cyanocobalamin  ((VITAMIN B-12)) injection 1,000 mcg  1,000 mcg Intramuscular Once Ulric Salzman L, MD        Review of Systems;  Patient denies headache, fevers, malaise, unintentional weight loss, skin rash, eye pain, sinus congestion and sinus pain, sore throat, dysphagia,  hemoptysis , cough, dyspnea, wheezing, chest pain, palpitations, orthopnea, edema, abdominal pain, nausea, melena, diarrhea, constipation, flank pain, dysuria, hematuria, urinary  Frequency, nocturia, numbness, tingling, seizures,  Focal weakness, Loss of consciousness,  Tremor, insomnia, depression, anxiety, and suicidal ideation.      Objective:  BP 120/64   Pulse (!) 57   Ht 5' 1 (1.549 m)   Wt 163 lb 3.2 oz (74 kg)   SpO2 98%   BMI 30.84 kg/m   BP Readings from Last 3 Encounters:  07/14/23 120/64  06/08/23 (!) 104/58  05/13/23 124/70    Wt Readings from Last 3 Encounters:  07/14/23 163 lb 3.2 oz (74 kg)  06/08/23 160 lb (72.6 kg)  05/13/23 160 lb (72.6 kg)    Physical Exam Vitals reviewed.  Constitutional:      General: She is not in acute distress.    Appearance: Normal appearance. She is normal weight. She is not ill-appearing,  toxic-appearing or diaphoretic.  HENT:     Head: Normocephalic.  Eyes:     General: No scleral icterus.       Right eye: No discharge.        Left eye: No discharge.     Conjunctiva/sclera: Conjunctivae normal.  Cardiovascular:     Rate and Rhythm: Normal rate and regular rhythm.     Heart sounds: Normal heart sounds.  Pulmonary:     Effort: Pulmonary effort is normal. No respiratory distress.     Breath sounds: Normal breath sounds.  Musculoskeletal:        General: Normal range of motion.  Skin:    General: Skin is warm and dry.  Neurological:  General: No focal deficit present.     Mental Status: She is alert and oriented to person, place, and time. Mental status is at baseline.  Psychiatric:        Mood and Affect: Mood normal.        Behavior: Behavior normal.        Thought Content: Thought content normal.        Judgment: Judgment normal.     Lab Results  Component Value Date   HGBA1C 6.1 02/04/2022   HGBA1C 6.1 10/28/2021   HGBA1C 5.9 (H) 02/15/2021    Lab Results  Component Value Date   CREATININE 0.92 04/23/2023   CREATININE 1.00 12/01/2022   CREATININE 1.55 (H) 11/18/2022    Lab Results  Component Value Date   WBC 5.4 04/23/2023   HGB 13.4 04/23/2023   HCT 40.7 04/23/2023   PLT 172 04/23/2023   GLUCOSE 107 (H) 04/23/2023   CHOL 137 02/04/2022   TRIG 120.0 02/04/2022   HDL 49.90 02/04/2022   LDLDIRECT 111.0 08/13/2016   LDLCALC 63 02/04/2022   ALT 28 11/18/2022   AST 35 11/18/2022   NA 138 04/23/2023   K 4.0 04/23/2023   CL 104 04/23/2023   CREATININE 0.92 04/23/2023   BUN 15 04/23/2023   CO2 27 04/23/2023   TSH 2.16 12/02/2022   INR 4.0 (H) 07/07/2023   HGBA1C 6.1 02/04/2022   MICROALBUR 0.3 11/21/2016    No results found.  Assessment & Plan:  .Hypertension, unspecified type Assessment & Plan: She has required a 4th agent to lower BP.  Screening for secondary causes were done and negative. BP is currently controlled on  telmisartan , hydrochlorothiazide  metoprolol  and amlodipine    Orders: -     amLODIPine  Besylate; Take 1 tablet (5 mg total) by mouth daily.  Dispense: 90 tablet; Refill: 1 -     Renal function panel -     Microalbumin / creatinine urine ratio  Controlled type 2 diabetes mellitus with microalbuminuria, without long-term current use of insulin (HCC) -     Hemoglobin A1c  Hyperlipidemia associated with type 2 diabetes mellitus (HCC) -     Lipid panel -     LDL cholesterol, direct -     Hepatic function panel  Morbid obesity (HCC) Assessment & Plan: I have addressed  BMI and recommended a low glycemic index diet utilizing smaller more frequent meals to increase metabolism.  I have also recommended that patient start exercising with a goal of 30 minutes of aerobic exercise a minimum of 5 days per week.    Vitamin D  deficiency -     VITAMIN D  25 Hydroxy (Vit-D Deficiency, Fractures)  Breast cancer screening by mammogram -     3D Screening Mammogram, Left and Right; Future  Prediabetes  Anticoagulation monitoring, INR range 2-3 Assessment & Plan: 5 mg daily dose resulted in INR of 4.0 .  Reducing dose to 4 mg daily   Lab Results  Component Value Date   INR 4.0 (H) 07/07/2023   INR 2.8 (H) 05/14/2023   INR 1.0 04/28/2023   PROTIME 57.9 (A) 02/17/2014      Carpal tunnel syndrome of right wrist Assessment & Plan: S/p carpal tunnel release by Penne Sharps in June with good results    Grief reaction with prolonged bereavement Assessment & Plan: She notes  improvement with addition of abilify  to wellbutrin  .    She declines dose adjustment    Functional diarrhea Assessment & Plan: Alternating with constipation.  Secondary to IBS per colonoscopy/GI evaluation.  Trial of daily benefiber recommended.    Other orders -     buPROPion  HCl ER (XL); Take 1 tablet (150 mg total) by mouth daily.  Dispense: 90 tablet; Refill: 1 -     Telmisartan ; Take 1 tablet (80 mg total) by  mouth daily.  Dispense: 90 tablet; Refill: 1 -     Furosemide ; Take 1 tablet (20 mg total) by mouth 2 (two) times a week. Tuesday and Friday  Dispense: 30 tablet; Refill: 1     I spent 34 minutes on the day of this face to face encounter reviewing patient's  most recent visit with cardiology,    prior relevant surgical and non surgical procedures, recent  labs and imaging studies, counseling on weight management,  reviewing the assessment and plan with patient, and post visit ordering and reviewing of  diagnostics and therapeutics with patient  .   Follow-up: Return in about 6 months (around 01/14/2024).   Verneita LITTIE Kettering, MD

## 2023-07-14 NOTE — Assessment & Plan Note (Signed)
 She has required a 4th agent to lower BP.  Screening for secondary causes were done and negative. BP is currently controlled on telmisartan , hydrochlorothiazide  metoprolol  and amlodipine 

## 2023-07-14 NOTE — Assessment & Plan Note (Signed)
 She notes  improvement with addition of abilify  to wellbutrin  .    She declines dose adjustment

## 2023-07-14 NOTE — Assessment & Plan Note (Signed)
 S/p carpal tunnel release by Penne Sharps in June with good results

## 2023-07-14 NOTE — Assessment & Plan Note (Signed)
 INR is 4.0- on 5 mg daily .  Reduce dose to 4 mg daily after skipping one day.  Next INR July 16

## 2023-07-14 NOTE — Assessment & Plan Note (Addendum)
 5 mg daily dose resulted in INR of 4.0 .  Reducing dose to 4 mg daily   Lab Results  Component Value Date   INR 4.0 (H) 07/07/2023   INR 2.8 (H) 05/14/2023   INR 1.0 04/28/2023   PROTIME 57.9 (A) 02/17/2014

## 2023-07-15 LAB — RENAL FUNCTION PANEL
Albumin: 4.4 g/dL (ref 3.5–5.2)
BUN: 21 mg/dL (ref 6–23)
CO2: 32 meq/L (ref 19–32)
Calcium: 9.3 mg/dL (ref 8.4–10.5)
Chloride: 101 meq/L (ref 96–112)
Creatinine, Ser: 1.19 mg/dL (ref 0.40–1.20)
GFR: 45.11 mL/min — ABNORMAL LOW (ref >60.00)
Glucose, Bld: 95 mg/dL (ref 70–99)
Phosphorus: 3.6 mg/dL (ref 2.3–4.6)
Potassium: 3.5 meq/L (ref 3.5–5.1)
Sodium: 142 meq/L (ref 135–145)

## 2023-07-15 LAB — LIPID PANEL
Cholesterol: 143 mg/dL (ref 0–200)
HDL: 52.4 mg/dL (ref >39.00)
LDL Cholesterol: 59 mg/dL (ref 0–99)
NonHDL: 90.1
Total CHOL/HDL Ratio: 3
Triglycerides: 157 mg/dL — ABNORMAL HIGH (ref 0.0–149.0)
VLDL: 31.4 mg/dL (ref 0.0–40.0)

## 2023-07-15 LAB — HEPATIC FUNCTION PANEL
ALT: 17 U/L (ref 0–35)
AST: 20 U/L (ref 0–37)
Albumin: 4.4 g/dL (ref 3.5–5.2)
Alkaline Phosphatase: 84 U/L (ref 39–117)
Bilirubin, Direct: 0.1 mg/dL (ref 0.0–0.3)
Total Bilirubin: 0.5 mg/dL (ref 0.2–1.2)
Total Protein: 6.3 g/dL (ref 6.0–8.3)

## 2023-07-15 LAB — MICROALBUMIN / CREATININE URINE RATIO
Creatinine,U: 210.4 mg/dL
Microalb Creat Ratio: 6.8 mg/g (ref 0.0–30.0)
Microalb, Ur: 1.4 mg/dL (ref 0.0–1.9)

## 2023-07-15 LAB — VITAMIN D 25 HYDROXY (VIT D DEFICIENCY, FRACTURES): VITD: 52.65 ng/mL (ref 30.00–100.00)

## 2023-07-15 LAB — LDL CHOLESTEROL, DIRECT: Direct LDL: 73 mg/dL

## 2023-07-15 LAB — HEMOGLOBIN A1C: Hgb A1c MFr Bld: 6.4 % (ref 4.6–6.5)

## 2023-07-19 ENCOUNTER — Ambulatory Visit: Payer: Self-pay | Admitting: Internal Medicine

## 2023-07-21 ENCOUNTER — Encounter: Payer: Self-pay | Admitting: Internal Medicine

## 2023-07-21 DIAGNOSIS — N1831 Chronic kidney disease, stage 3a: Secondary | ICD-10-CM | POA: Insufficient documentation

## 2023-07-21 NOTE — Assessment & Plan Note (Signed)
 GFR has been < 60 ml/min since Jan 2024.  She has hypertension without proteinuria .  Remote history of toxic shock syndrome.   No history of diabetes. No NSAID use due to chronic use of anticoagulation for DVT/PE /     CBC normal. Referring to nephrology for further evaluation.   Lab Results  Component Value Date   CREATININE 1.19 07/14/2023   Lab Results  Component Value Date   WBC 5.4 04/23/2023   HGB 13.4 04/23/2023   HCT 40.7 04/23/2023   MCV 86.6 04/23/2023   PLT 172 04/23/2023

## 2023-07-24 ENCOUNTER — Other Ambulatory Visit (INDEPENDENT_AMBULATORY_CARE_PROVIDER_SITE_OTHER)

## 2023-07-24 DIAGNOSIS — Z7901 Long term (current) use of anticoagulants: Secondary | ICD-10-CM | POA: Diagnosis not present

## 2023-07-24 LAB — PROTIME-INR
INR: 3.1 ratio — ABNORMAL HIGH (ref 0.8–1.0)
Prothrombin Time: 31.4 s — ABNORMAL HIGH (ref 9.6–13.1)

## 2023-07-26 ENCOUNTER — Ambulatory Visit: Payer: Self-pay | Admitting: Internal Medicine

## 2023-08-06 ENCOUNTER — Encounter: Payer: Self-pay | Admitting: Internal Medicine

## 2023-08-06 DIAGNOSIS — R8290 Unspecified abnormal findings in urine: Secondary | ICD-10-CM | POA: Diagnosis not present

## 2023-08-06 DIAGNOSIS — N1831 Chronic kidney disease, stage 3a: Secondary | ICD-10-CM | POA: Diagnosis not present

## 2023-08-06 DIAGNOSIS — I129 Hypertensive chronic kidney disease with stage 1 through stage 4 chronic kidney disease, or unspecified chronic kidney disease: Secondary | ICD-10-CM | POA: Diagnosis not present

## 2023-08-06 DIAGNOSIS — I10 Essential (primary) hypertension: Secondary | ICD-10-CM | POA: Diagnosis not present

## 2023-08-06 DIAGNOSIS — N189 Chronic kidney disease, unspecified: Secondary | ICD-10-CM | POA: Diagnosis not present

## 2023-08-10 ENCOUNTER — Telehealth: Payer: Self-pay | Admitting: Pharmacy Technician

## 2023-08-10 ENCOUNTER — Other Ambulatory Visit (HOSPITAL_COMMUNITY): Payer: Self-pay

## 2023-08-10 DIAGNOSIS — E663 Overweight: Secondary | ICD-10-CM

## 2023-08-10 MED ORDER — TIRZEPATIDE 2.5 MG/0.5ML ~~LOC~~ SOAJ: 2.5000 mg | SUBCUTANEOUS | 2 refills | Status: DC

## 2023-08-10 NOTE — Telephone Encounter (Signed)
 I do not see that pt has ever had an A1c 6.5 or higher. The highest seen in chart was 6.4.

## 2023-08-10 NOTE — Telephone Encounter (Addendum)
 Pharmacy Patient Advocate Encounter   Received notification from CoverMyMeds that prior authorization for Mounjaro  2.5MG /0.5ML auto-injectors is required/requested.   Insurance verification completed.   The patient is insured through [facility] ADVANTAGE/RX ADVANCE . Key: [id number]   Prior Authorization form/request asks a question that requires your assistance. Please see the question below and advise accordingly. The PA will not be submitted until the necessary information is received.   **Has this patient ever had an A1c of 6.5 or higher? Without documentation, they do not  currently meet the criteria for prior authorization of this medication.**

## 2023-08-11 ENCOUNTER — Encounter: Payer: Self-pay | Admitting: Internal Medicine

## 2023-08-11 MED ORDER — TIRZEPATIDE-WEIGHT MANAGEMENT 2.5 MG/0.5ML ~~LOC~~ SOLN: 2.5000 mg | SUBCUTANEOUS | 2 refills | Status: DC

## 2023-08-11 NOTE — Telephone Encounter (Signed)
 Okay, thank you!

## 2023-08-23 ENCOUNTER — Encounter: Payer: Self-pay | Admitting: Internal Medicine

## 2023-08-24 ENCOUNTER — Other Ambulatory Visit (HOSPITAL_COMMUNITY): Payer: Self-pay

## 2023-08-24 ENCOUNTER — Telehealth: Payer: Self-pay

## 2023-08-24 NOTE — Telephone Encounter (Signed)
 PA request has been Submitted. New Encounter has been or will be created for follow up. For additional info see Pharmacy Prior Auth telephone encounter from 08/24/2023.

## 2023-08-24 NOTE — Telephone Encounter (Signed)
PA for Zepbound is needed.  

## 2023-08-24 NOTE — Telephone Encounter (Signed)
 Pharmacy Patient Advocate Encounter   Received notification from CoverMyMeds that prior authorization for Zepbound  2.5MG /0.5ML pen-injectors is required/requested.   Insurance verification completed.   The patient is insured through [facility] ADVANTAGE/RX ADVANCE .   Per test claim: PA required; PA submitted to above mentioned insurance via Latent Key/confirmation #/EOC [id number] Status is pending

## 2023-08-24 NOTE — Telephone Encounter (Signed)
 noted

## 2023-08-25 ENCOUNTER — Telehealth: Payer: Self-pay | Admitting: *Deleted

## 2023-08-25 ENCOUNTER — Ambulatory Visit (INDEPENDENT_AMBULATORY_CARE_PROVIDER_SITE_OTHER): Admitting: *Deleted

## 2023-08-25 VITALS — BP 112/63 | HR 57 | Ht 61.0 in | Wt 160.0 lb

## 2023-08-25 DIAGNOSIS — I1A0 Resistant hypertension: Secondary | ICD-10-CM

## 2023-08-25 DIAGNOSIS — Z0000 Encounter for general adult medical examination without abnormal findings: Secondary | ICD-10-CM

## 2023-08-25 MED ORDER — METOPROLOL SUCCINATE ER 50 MG PO TB24: 50.0000 mg | ORAL_TABLET | Freq: Every day | ORAL | 0 refills | Status: DC

## 2023-08-25 NOTE — Telephone Encounter (Signed)
 Spoke with pt in regards to her low blood pressure readings. Pt stated that when it drops low she feels tired and dizzy. Pt is currently taking Telmisartan  80 mg in the morning, Amlodipine  5 mg at night, metoprolol  100 mg at night, and hydrochlorothiazide  25 mg at night. Pt is wondering if she needs to adjust her blood pressure medications so it stops dropping. Some readings she has gotten is 105/54 HR 53 and 112/63 HR 57. I scheduled pt for next available appt on 09/09/2023 at 5 pm for a virtual visit.

## 2023-08-25 NOTE — Patient Instructions (Signed)
 [name] , Thank you for taking time out of your busy schedule to complete your Annual Wellness Visit with me. I enjoyed our conversation and look forward to speaking with you again next year. I, as well as your care team,  appreciate your ongoing commitment to your health goals. Please review the following plan we discussed and let me know if I can assist you in the future. Your Game plan/ To Do List    Referrals: If you haven't heard from the office you've been referred to, please reach out to them at the phone provided.  Remember to call and schedule your mammogram that was ordered 07/14/23.  Consider updating your tetanus and shingles vaccines. Remember to get your annual flu vaccine.   Follow up Visits: We will see or speak with you next year for your Next Medicare AWV with our clinical staff 08/29/24 @ 11:30 Have you seen your provider in the last 6 months (3 months if uncontrolled diabetes)? Yes  Clinician Recommendations:  Aim for 30 minutes of exercise or brisk walking, 6-8 glasses of water, and 5 servings of fruits and vegetables each day.       This is a list of the screenings recommended for you:  Health Maintenance  Topic Date Due   Zoster (Shingles) Vaccine (1 of 2) Never done   COVID-19 Vaccine (3 - Pfizer risk series) 04/20/2019   DTaP/Tdap/Td vaccine (2 - Td or Tdap) 10/28/2022   Mammogram  06/24/2023   Flu Shot  08/07/2023   Yearly kidney function blood test for diabetes  07/13/2024   Yearly kidney health urinalysis for diabetes  07/13/2024   Medicare Annual Wellness Visit  08/24/2024   Colon Cancer Screening  07/16/2031   Pneumococcal Vaccine for age over 73  Completed   DEXA scan (bone density measurement)  Completed   Hepatitis C Screening  Completed   HPV Vaccine  Aged Out   Meningitis B Vaccine  Aged Out   Pneumococcal Vaccine  Discontinued    Advanced directives: (Declined) Advance directive discussed with you today. Even though you declined this today, please  call our office should you change your mind, and we can give you the proper paperwork for you to fill out. Advance Care Planning is important because it:  [x]  Makes sure you receive the medical care that is consistent with your values, goals, and preferences  [x]  It provides guidance to your family and loved ones and reduces their decisional burden about whether or not they are making the right decisions based on your wishes.  Follow the link provided in your after visit summary or read over the paperwork we have mailed to you to help you started getting your Advance Directives in place. If you need assistance in completing these, please reach out to us  so that we can help you!

## 2023-08-25 NOTE — Progress Notes (Signed)
 Subjective:   [name] is a 73 y.o. who presents for a Medicare Wellness preventive visit.  As a reminder, Annual Wellness Visits don't include a physical exam, and some assessments may be limited, especially if this visit is performed virtually. We may recommend an in-person follow-up visit with your provider if needed.  Visit Complete: Virtual I connected with  [name] on 08/25/23 by a audio enabled telemedicine application and verified that I am speaking with the correct person using two identifiers.  Patient Location: Home  Provider Location: Home Office  I discussed the limitations of evaluation and management by telemedicine. The patient expressed understanding and agreed to proceed.  Vital Signs: Because this visit was a virtual/telehealth visit, some criteria may be missing or patient reported. Any vitals not documented were not able to be obtained and vitals that have been documented are patient reported.  VideoDeclined- This patient declined Librarian, academic. Therefore the visit was completed with audio only.  Persons Participating in Visit: Patient.  AWV Questionnaire: No: Patient Medicare AWV questionnaire was not completed prior to this visit.  Cardiac Risk Factors include: advanced age (>77men, >52 women);hypertension;obesity (BMI >30kg/m2)     Objective:    Today's Vitals   08/25/23 1132 08/25/23 1158  BP: (!) 105/54 112/63  Pulse: (!) 53 (!) 57  Weight: 160 lb (72.6 kg)   Height: 5' 1 (1.549 m)    Body mass index is 30.23 kg/m.     08/25/2023   11:56 AM 04/21/2023   10:16 AM 11/18/2022    6:02 PM 05/19/2022    2:38 PM 07/12/2021    7:53 AM 05/08/2021    3:29 PM 04/30/2020   11:57 AM  Advanced Directives  Does Patient Have a Medical Advance Directive? No No No Yes Yes No Yes  Type of Theme park manager;Living will   Healthcare Power of [place];Living will  Does patient want to make  changes to medical advance directive?       No - Patient declined  Copy of Healthcare Power of Attorney in Chart?    No - copy requested   No - copy requested  Would patient like information on creating a medical advance directive? No - Patient declined No - Patient declined    No - Patient declined     Current Medications (verified) Outpatient Encounter Medications as of 08/25/2023  Medication Sig   acetaminophen  (TYLENOL ) 500 MG tablet Take 2 tablets (1,000 mg total) by mouth every 8 (eight) hours as needed.   amLODipine  (NORVASC ) 5 MG tablet Take 1 tablet (5 mg total) by mouth daily.   ARIPiprazole  (ABILIFY ) 2 MG tablet Take 2 mg by mouth daily. (Patient taking differently: Take 2 mg by mouth daily.)   aspirin 81 MG tablet Take 81 mg by mouth daily. (Patient taking differently: Take 81 mg by mouth daily as needed.)   atorvastatin  (LIPITOR) 40 MG tablet Take 1 tablet (40 mg total) by mouth daily.   buPROPion  (WELLBUTRIN  XL) 150 MG 24 hr tablet Take 1 tablet (150 mg total) by mouth daily.   Cholecalciferol (VITAMIN D3) 1000 UNITS CAPS Take 1 capsule by mouth daily.   cyanocobalamin  (VITAMIN B12) 1000 MCG/ML injection INJECT 1ML INTO THE MUSCLE EVERY 30 DAYS   diphenhydrAMINE  (BENADRYL ) 25 mg capsule Take 25 mg by mouth every 8 (eight) hours as needed for itching or allergies.   furosemide  (LASIX ) 20 MG tablet Take 1 tablet (20  mg total) by mouth 2 (two) times a week. Tuesday and Friday   hydrochlorothiazide  (HYDRODIURIL ) 25 MG tablet Take 1 tablet (25 mg total) by mouth daily.   methocarbamol  (ROBAXIN ) 500 MG tablet Take 1 tablet (500 mg total) by mouth at bedtime.   metoprolol  succinate (TOPROL -XL) 100 MG 24 hr tablet Take 1 tablet (100 mg total) by mouth daily.   Omega-3 Fatty Acids (FISH OIL) 1000 MG CAPS Take 1 capsule daily by mouth.   omeprazole  (PRILOSEC) 20 MG capsule Take 1 capsule (20 mg total) by mouth daily.   ondansetron  (ZOFRAN ) 4 MG tablet Take 1 tablet (4 mg total) by mouth  every 8 (eight) hours as needed for nausea or vomiting.   OVER THE COUNTER MEDICATION Take 1 tablet by mouth daily. Migrerelief (Patient taking differently: Take 1 tablet by mouth daily as needed. Migrerelief)   pregabalin  (LYRICA ) 75 MG capsule Take 1 capsule (75 mg total) by mouth 2 (two) times daily.   promethazine  (PHENERGAN ) 12.5 MG tablet Take 12.5 mg by mouth every 6 (six) hours as needed for nausea or vomiting.   telmisartan  (MICARDIS ) 80 MG tablet Take 1 tablet (80 mg total) by mouth daily.   warfarin (COUMADIN ) 4 MG tablet Take 1 tablet (4 mg total) by mouth daily.   docusate sodium  (COLACE) 100 MG capsule Take 1 capsule (100 mg total) by mouth 2 (two) times daily. (Patient not taking: Reported on 08/25/2023)   ibuprofen (ADVIL) 600 MG tablet Take 600 mg by mouth every 6 (six) hours as needed for moderate pain (pain score 4-6). (Patient not taking: Reported on 08/25/2023)   mirtazapine  (REMERON ) 15 MG tablet Take 7.5-15 mg by mouth at bedtime. (Patient not taking: Reported on 08/25/2023)   senna (SENOKOT) 8.6 MG TABS tablet Take 1 tablet (8.6 mg total) by mouth daily. (Patient not taking: Reported on 08/25/2023)   tirzepatide  (ZEPBOUND ) 2.5 MG/0.5ML injection vial Inject 2.5 mg into the skin once a week. (Patient not taking: Reported on 08/25/2023)   traZODone  (DESYREL ) 50 MG tablet Take 50 mg by mouth at bedtime as needed for sleep. 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   Aortic atherosclerosis (HCC)    B12 deficiency    Clotting disorder (HCC) 08/08/2009   a.)  hypercoagulable workup 08/08/2009: (+) lupus anticoagulant (dPT 61.4; confirm ratio 1.29; dRVVT 49.4); Factor V leiden (-), Factor II (-), Protein C (functional 41%, Ag 46%); Protein S (total 74%, free 77%, functional 58%); ATIII (Ag 80%; activity 80%)   Depression    GERD (gastroesophageal reflux disease)    H/O toxic shock syndrome 2001   post urologic procedure for stone removal   History of bilateral cataract extraction 2021   History of kidney stones    History of pulmonary embolus (PE) 09/2009   a.) found to have (+) 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   HOH (hard of hearing); wears BILATERAL hearing aids    Hyperlipidemia    Hypertension    Ischemic colitis (HCC)    a.) s/p left hemicolectomy 05/2011   Long-term use of aspirin therapy    Migraines  PONV (postoperative nausea and vomiting)    Pre-diabetes    Past Surgical History:  Procedure Laterality Date   ABDOMINAL HYSTERECTOMY  1996   APPENDECTOMY     bilateral tubal ligation     BREAST EXCISIONAL BIOPSY Left 1989   benign   BREAST SURGERY  1989   left biopsy, normal   CARPAL TUNNEL RELEASE Right 04/28/2023   Procedure: CARPAL TUNNEL RELEASE;  Surgeon: Claudene Penne ORN, MD;  Location: [facility] ORS;  Service: Neurosurgery;  Laterality: Right;   CATARACT EXTRACTION W/PHACO Right 05/30/2019   Procedure: CATARACT EXTRACTION PHACO AND INTRAOCULAR LENS PLACEMENT (IOC) RIGHT DIABETIC;  Surgeon: Myrna Adine Anes, MD;  Location[facility];  Service: Ophthalmology;  Laterality: Right;  3.84 0:37.1   CATARACT EXTRACTION W/PHACO Left 06/20/2019   Procedure: CATARACT EXTRACTION PHACO AND INTRAOCULAR LENS PLACEMENT (IOC) LEFT DIABETIC 2.58  00:29.3;  Surgeon: Myrna Adine Anes, MD;  Location: [facility];  Service: Ophthalmology;  Laterality: Left;  Diabetic   COLECTOMY Left 05/2011   [place], ischemic colitis   COLONOSCOPY WITH PROPOFOL  N/A 11/16/2014   Procedure: COLONOSCOPY WITH PROPOFOL ;  Surgeon:  Lamar ONEIDA Holmes, MD;  Location: [facility];  Service: Endoscopy;  Laterality: N/A;   COLONOSCOPY WITH PROPOFOL  N/A 07/12/2021   Procedure: COLONOSCOPY WITH PROPOFOL ;  Surgeon: Maryruth Ole ONEIDA, MD;  Location: [facility] ENDOSCOPY;  Service: Endoscopy;  Laterality: N/A;   ESOPHAGOGASTRODUODENOSCOPY (EGD) WITH PROPOFOL  N/A 11/16/2014   Procedure: ESOPHAGOGASTRODUODENOSCOPY (EGD) WITH PROPOFOL ;  Surgeon: Lamar ONEIDA Holmes, MD;  Location: [facility] ENDOSCOPY;  Service: Endoscopy;  Laterality: N/A;   ESOPHAGOGASTRODUODENOSCOPY (EGD) WITH PROPOFOL  N/A 07/12/2021   Procedure: ESOPHAGOGASTRODUODENOSCOPY (EGD) WITH PROPOFOL ;  Surgeon: Maryruth Ole ONEIDA, MD;  Location: [facility] ENDOSCOPY;  Service: Endoscopy;  Laterality: N/A;   IVC FILTER INSERTION     temporary   SPINE SURGERY  1984   lumbar diskectomy, [place]   ureterolithotomy     Dr. Waddell urology ~ 2012 kidney stones in ICU after surgery   Family History  Problem Relation Age of Onset   Hypertension Mother    Cancer Mother    Diabetes Mother    Congestive Heart Failure Mother    Heart disease Father    Hypertension Father    Diabetes Father    Alzheimer's disease Sister    Breast cancer Sister 60       DCIS   Breast cancer Paternal Aunt        <50   Breast cancer Cousin    Social History   Socioeconomic History   Marital status: Widowed    Spouse name: Not on file   Number of children: Not on file   Years of education: Not on file   Highest education level: Not on file  Occupational History   Not on file  Tobacco Use   Smoking status: Never   Smokeless tobacco: Never  Vaping Use   Vaping status: Never Used  Substance and Sexual Activity   Alcohol use: No   Drug use: No   Sexual activity: Yes  Other Topics Concern   Not on file  Social History Narrative   Not on file   Social Drivers of Health   Financial Resource Strain: Low Risk  (08/25/2023)   Overall Financial Resource Strain (CARDIA)    Difficulty of Paying Living  Expenses: Not hard at all  Food Insecurity: No Food Insecurity (08/25/2023)   Hunger Vital Sign    Worried About Running Out of Food in the Last Year: Never  true    Ran Out of Food in the Last Year: Never true  Transportation Needs: No Transportation Needs (08/25/2023)   PRAPARE - Administrator, Civil Service (Medical): No    Lack of Transportation (Non-Medical): No  Physical Activity: Inactive (08/25/2023)   Exercise Vital Sign    Days of Exercise per Week: 0 days    Minutes of Exercise per Session: 0 min  Stress: No Stress Concern Present (08/25/2023)   [employer] of Occupational Health - Occupational Stress Questionnaire    Feeling of Stress: Only a little  Social Connections: Moderately Integrated (08/25/2023)   Social Connection and Isolation Panel    Frequency of Communication with Friends and Family: More than three times a week    Frequency of Social Gatherings with Friends and Family: More than three times a week    Attends Religious Services: More than 4 times per year    Active Member of [employer] or Organizations: Yes    Attends Banker Meetings: More than 4 times per year    Marital Status: Widowed    Tobacco Counseling Counseling given: Not Answered    Clinical Intake:  Pre-visit preparation completed: Yes  Pain : No/denies pain     BMI - recorded: 30.23 Nutritional Status: BMI 25 -29 Overweight Nutritional Risks: None Diabetes: No  Lab Results  Component Value Date   HGBA1C 6.4 07/14/2023   HGBA1C 6.1 02/04/2022   HGBA1C 6.1 10/28/2021     How often do you need to have someone help you when you read instructions, pamphlets, or other written materials from your doctor or pharmacy?: 1 - Never  Interpreter Needed?: No  Information entered by :: R. Jacob Chamblee LPN   Activities of Daily Living     08/25/2023   11:36 AM 04/21/2023   10:18 AM  In your present state of health, do you have any difficulty performing the following  activities:  Hearing? 1   Vision? 0   Difficulty concentrating or making decisions? 0   Walking or climbing stairs? 0   Dressing or bathing? 0   Doing errands, shopping? 0 0  Preparing Food and eating ? N   Using the Toilet? N   In the past six months, have you accidently leaked urine? Y   Do you have problems with loss of bowel control? Y   Managing your Medications? N   Managing your Finances? N   Housekeeping or managing your Housekeeping? N     Patient Care Team: Marylynn Verneita CROME, MD as PCP - General (Internal Medicine) Darliss Rogue, MD as PCP - Cardiology (Cardiology) Viktoria Lamar DASEN, MD (Inactive) (Gastroenterology)  I have updated your Care Teams any recent Medical Services you may have received from other providers in the past year.     Assessment:   This is a routine wellness examination for Ke[place].  Hearing/Vision screen Hearing Screening - Comments:: Wears aids Vision Screening - Comments:: readers   Goals Addressed             This Visit's Progress    Patient Stated       Wants better eating habits and exercise more       Depression Screen     08/25/2023   11:48 AM 07/14/2023    1:46 PM 02/20/2023    2:37 PM 01/13/2023    2:18 PM 12/01/2022   11:13 AM 11/03/2022    9:09 AM 05/19/2022    2:35 PM  PHQ 2/9 Scores  PHQ - 2 Score 1 0 0 2 2 2  0  PHQ- 9 Score 8  3 7 12 8  0    Fall Risk     08/25/2023   11:39 AM 07/14/2023    1:46 PM 02/20/2023    2:36 PM 01/13/2023    2:17 PM 12/01/2022   11:13 AM  Fall Risk   Falls in the past year? 1 0 1 1 0  Number falls in past yr: 0 0 1 0 0  Injury with Fall? 0 0 0 0 0  Risk for fall due to : History of fall(s);Impaired balance/gait History of fall(s) History of fall(s) History of fall(s) No Fall Risks  Follow up Falls evaluation completed;Falls prevention discussed Falls evaluation completed Falls evaluation completed Falls evaluation completed Falls evaluation completed    MEDICARE RISK AT HOME:  Medicare  Risk at Home Any stairs in or around the home?: No If so, are there any without handrails?: No Home free of loose throw rugs in walkways, pet beds, electrical cords, etc?: Yes Adequate lighting in your home to reduce risk of falls?: Yes Life alert?: No Use of a cane, walker or w/c?: No Grab bars in the bathroom?: Yes Shower chair or bench in shower?: Yes Elevated toilet seat or a handicapped toilet?: Yes  TIMED UP AND GO:  Was the test performed?  No  Cognitive Function: 6CIT completed    04/28/2019   11:27 AM 03/23/2017    3:55 PM 03/21/2016    3:39 PM  MMSE - Mini Mental State Exam  Not completed: Unable to complete    Orientation to time  5 5   Orientation to Place  5 5   Registration  3 3   Attention/ Calculation  5 5   Recall  3 3   Language- name 2 objects  2 2   Language- repeat  1 1  Language- follow 3 step command  3 3   Language- read & follow direction  1 1   Write a sentence  1 1   Copy design  1 1   Total score  30 30      Data saved with a previous flowsheet row definition        08/25/2023   11:56 AM 05/19/2022    2:36 PM 04/27/2018   11:27 AM  6CIT Screen  What Year? 0 points 0 points 0 points  What month? 0 points 0 points 0 points  What time? 0 points 0 points 0 points  Count back from 20 0 points 2 points 0 points  Months in reverse 0 points 0 points 0 points  Repeat phrase 0 points 0 points 0 points  Total Score 0 points 2 points 0 points    Immunizations Immunization History  Administered Date(s) Administered   Fluad Quad(high Dose 65+) 09/17/2018, 10/05/2019, 10/28/2021   Fluad Trivalent(High Dose 65+) 11/03/2022   Influenza, High Dose Seasonal PF 09/03/2015, 11/21/2016, 11/13/2017   Influenza,inj,Quad PF,6+ Mos 10/27/2012, 11/02/2013   Influenza-Unspecified 11/07/2014   PFIZER(Purple Top)SARS-COV-2 Vaccination 03/02/2019, 03/23/2019   Pneumococcal Conjugate-13 05/19/2014   Pneumococcal Polysaccharide-23 05/31/2015    Pneumococcal-Unspecified 12/07/2015   Tdap 10/27/2012    Screening Tests Health Maintenance  Topic Date Due   Zoster Vaccines- Shingrix (1 of 2) Never done   COVID-19 Vaccine (3 - Pfizer risk series) 04/20/2019   DTaP/Tdap/Td (2 - Td or Tdap) 10/28/2022   Medicare Annual Wellness (AWV)  05/19/2023   MAMMOGRAM  06/24/2023   INFLUENZA  VACCINE  08/07/2023   Diabetic kidney evaluation - eGFR measurement  07/13/2024   Diabetic kidney evaluation - Urine ACR  07/13/2024   Colonoscopy  07/16/2031   Pneumococcal Vaccine: 50+ Years  Completed   DEXA SCAN  Completed   Hepatitis C Screening  Completed   HPV VACCINES  Aged Out   Meningococcal B Vaccine  Aged Out   Pneumococcal Vaccine  Discontinued    Health Maintenance  Health Maintenance Due  Topic Date Due   Zoster Vaccines- Shingrix (1 of 2) Never done   COVID-19 Vaccine (3 - Pfizer risk series) 04/20/2019   DTaP/Tdap/Td (2 - Td or Tdap) 10/28/2022   Medicare Annual Wellness (AWV)  05/19/2023   MAMMOGRAM  06/24/2023   INFLUENZA VACCINE  08/07/2023   Health Maintenance Items Addressed: Discussed the need to update Tetanus and shingles vaccines. Reminded patient to get an annual flu vaccine.. . Patient declines covid vaccine and Dexa. Patient was reminded that mammogram was ordered 07/14/23 and she agreed to call and scheduled  Additional Screening:  Vision Screening: Recommended annual ophthalmology exams for early detection of glaucoma and other disorders of the eye. Up to date [facility] Would you like a referral to an eye doctor? No    Dental Screening: Recommended annual dental exams for proper oral hygiene  Community Resource Referral / Chronic Care Management: CRR required this visit?  No   CCM required this visit?  No   Plan:    I have personally reviewed and noted the following in the patient's chart:   Medical and social history Use of alcohol, tobacco or illicit drugs  Current medications and supplements  including opioid prescriptions. Patient is not currently taking opioid prescriptions. Functional ability and status Nutritional status Physical activity Advanced directives List of other physicians Hospitalizations, surgeries, and ER visits in previous 12 months Vitals Screenings to include cognitive, depression, and falls Referrals and appointments  In addition, I have reviewed and discussed with patient certain preventive protocols, quality metrics, and best practice recommendations. A written personalized care plan for preventive services as well as general preventive health recommendations were provided to patient.   Angeline Fredericks, LPN   1/80/7974   After Visit Summary: (MyChart) Due to this being a telephonic visit, the after visit summary with patients personalized plan was offered to patient via MyChart   Notes: Nothing significant to report at this time.  Phone note sent to PCP

## 2023-08-25 NOTE — Telephone Encounter (Addendum)
 Performed AWV Patient stated that her blood pressure has been running low. Patient stated this morning her BP  was 105/54 pulse 53 about 2 hours before the call.  Patient checked it while on the phone and it was 112/63 pulse 57. Patient stated that she has been feeling real tired and thinks that she may be taking too much blood pressure medication. Patient denies that she has diabetes but has prediabetes. Patient wants to know if she should adjust her blood pressure medication or does she need to come in before her next appointment.  See depression screening.

## 2023-08-25 NOTE — Telephone Encounter (Signed)
Pt is aware of medication changes and gave a verbal understanding.

## 2023-08-25 NOTE — Assessment & Plan Note (Signed)
 METOPROLO DOSE REDUCE TO 50 MG DAILY FOR PULSE IN 50'S AND BP IN LOW 100'S

## 2023-08-26 NOTE — Telephone Encounter (Signed)
 Pharmacy Patient Advocate Encounter  Received notification from [facility] [employer] that Prior Authorization for Zepbound  2.5MG /0.5ML pen-injectors has been DENIED.  Full denial letter will be uploaded to the media tab. See denial reason below.  We have denied your request as the diagnosis submitted is considered to be part of an excluded (not covered) category, medications utilized to promote weight loss   PA #/Case ID/Reference #: [id number]

## 2023-08-28 NOTE — Telephone Encounter (Signed)
 Pt has been notified not wanting to pursue any other alternative at this time

## 2023-09-09 ENCOUNTER — Telehealth (INDEPENDENT_AMBULATORY_CARE_PROVIDER_SITE_OTHER): Admitting: Internal Medicine

## 2023-09-09 ENCOUNTER — Encounter: Payer: Self-pay | Admitting: Internal Medicine

## 2023-09-09 VITALS — BP 136/76 | HR 63 | Ht 61.0 in | Wt 162.0 lb

## 2023-09-09 DIAGNOSIS — I1A0 Resistant hypertension: Secondary | ICD-10-CM

## 2023-09-09 DIAGNOSIS — F5105 Insomnia due to other mental disorder: Secondary | ICD-10-CM

## 2023-09-09 DIAGNOSIS — F409 Phobic anxiety disorder, unspecified: Secondary | ICD-10-CM | POA: Diagnosis not present

## 2023-09-09 NOTE — Patient Instructions (Signed)
 Suspend the amlodipine  for now  Continue metoprolol  50 mg and telmisartan  80 mg and continue to check your BP twice daily and send me readings in a week

## 2023-09-09 NOTE — Progress Notes (Signed)
 Virtual Visit via Caregility   Note   This format is felt to be most appropriate for this patient at this time.  All issues noted in this document were discussed and addressed.  No physical exam was performed (except for noted visual exam findings with Video Visits).   I connected with [name]  on 09/09/23 at  5:00 PM EDT by a video enabled telemedicine application and verified that I am speaking with the correct person using two identifiers. Location patient: home Location provider: work or home office Persons participating in the virtual visit: patient, provider  I discussed the limitations, risks, security and privacy concerns of performing an evaluation and management service by telephone and the availability of in person appointments. I also discussed with the patient that there may be a patient responsible charge related to this service. The patient expressed understanding and agreed to proceed.  Reason for visit: HYPERTENSION FOLLOW UP  HPI:   [name] is a 73 yr old white female with a history of hypertension, , aortic atherosclerosis, and type 2 DM who presents for BP management.  Has been taking a reduced dose of metoprolol ,  50 mg  QHS since August 19, along with amlodipine  5 mg at bedtime hydrochlorothiazide  25 mg in the am and 80 mg telmisartan  QAM.  Readings have ranged from 84/59 yesterday after taking a shower,  to 136/76   she feels light headed and tired frequently when readings are < 120 systolic,  pulse is in the 60's.    ROS: See pertinent positives and negatives per HPI.  Past Medical History:  Diagnosis Date   Anticoagulant long-term use    Anticoagulated with warfarin    a.) lifelong anticoagulation (initiated in 2011) due to lab confirmed (+) lupus anticoagulant and Protein C&S deficiencies   Anxiety 2013   Aortic atherosclerosis (HCC)    B12 deficiency    Chronic kidney disease    Clotting disorder (HCC) 2103   a.) hypercoagulable workup 08/08/2009: (+) lupus  anticoagulant (dPT 61.4; confirm ratio 1.29; dRVVT 49.4); Factor V leiden (-), Factor II (-), Protein C (functional 41%, Ag 46%); Protein S (total 74%, free 77%, functional 58%); ATIII (Ag 80%; activity 80%)   Depression 2016   Diabetes mellitus without complication (HCC) 2017   GERD (gastroesophageal reflux disease) 2013   H/O toxic shock syndrome 2001   post urologic procedure for stone removal   History of bilateral cataract extraction 2021   History of kidney stones    History of pulmonary embolus (PE) 09/2009   a.) found to have (+) 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   HOH (hard of hearing); wears BILATERAL hearing aids    Hyperlipidemia    Hypertension    Ischemic colitis (HCC)    a.) s/p left hemicolectomy 05/2011   Long-term use of aspirin therapy    Migraines    PONV (postoperative nausea and vomiting)    Pre-diabetes     Past Surgical History:  Procedure Laterality Date   ABDOMINAL HYSTERECTOMY  1996   APPENDECTOMY  1976   bilateral tubal ligation     BREAST EXCISIONAL BIOPSY Left 1989   benign   BREAST SURGERY  1989   left biopsy, normal   CARPAL TUNNEL RELEASE Right 04/28/2023   Procedure: CARPAL TUNNEL RELEASE;  Surgeon: Claudene Penne ORN, MD;  Location: [facility];  Service: Neurosurgery;  Laterality: Right;   CATARACT EXTRACTION W/PHACO Right 05/30/2019   Procedure: CATARACT EXTRACTION PHACO AND INTRAOCULAR LENS PLACEMENT (IOC)  RIGHT DIABETIC;  Surgeon: Myrna Adine Anes, MD;  Location: [facility];  Service: Ophthalmology;  Laterality: Right;  3.84 0:37.1   CATARACT EXTRACTION W/PHACO Left 06/20/2019   Procedure: CATARACT EXTRACTION PHACO AND INTRAOCULAR LENS PLACEMENT (IOC) LEFT DIABETIC 2.58  00:29.3;  Surgeon: Myrna Adine Anes, MD;  Location: [facility];  Service: Ophthalmology;  Laterality: Left;  Diabetic   COLECTOMY Left 05/2011   [place], ischemic colitis   COLONOSCOPY WITH PROPOFOL  N/A 11/16/2014   Procedure: COLONOSCOPY  WITH PROPOFOL ;  Surgeon: Lamar ONEIDA Holmes, MD;  Location: [facility];  Service: Endoscopy;  Laterality: N/A;   COLONOSCOPY WITH PROPOFOL  N/A 07/12/2021   Procedure: COLONOSCOPY WITH PROPOFOL ;  Surgeon: Maryruth Ole ONEIDA, MD;  Location: [facility] ENDOSCOPY;  Service: Endoscopy;  Laterality: N/A;   ESOPHAGOGASTRODUODENOSCOPY (EGD) WITH PROPOFOL  N/A 11/16/2014   Procedure: ESOPHAGOGASTRODUODENOSCOPY (EGD) WITH PROPOFOL ;  Surgeon: Lamar ONEIDA Holmes, MD;  Location: [facility] ENDOSCOPY;  Service: Endoscopy;  Laterality: N/A;   ESOPHAGOGASTRODUODENOSCOPY (EGD) WITH PROPOFOL  N/A 07/12/2021   Procedure: ESOPHAGOGASTRODUODENOSCOPY (EGD) WITH PROPOFOL ;  Surgeon: Maryruth Ole ONEIDA, MD;  Location: [facility] ENDOSCOPY;  Service: Endoscopy;  Laterality: N/A;   EYE SURGERY  2016/2017   tear ducts   IVC FILTER INSERTION     temporary   SPINE SURGERY  1984   lumbar diskectomy, [place]   TUBAL LIGATION  1976   ureterolithotomy     Dr. Waddell urology ~ 2012 kidney stones in ICU after surgery    Family History  Problem Relation Age of Onset   Hypertension Mother    Cancer Mother    Diabetes Mother    Congestive Heart Failure Mother    COPD Mother    Heart disease Father    Hypertension Father    Diabetes Father    Stroke Father    Alzheimer's disease Sister    Breast cancer Sister 26       DCIS   Breast cancer Paternal Aunt        <50   Breast cancer Cousin    Heart disease Paternal Uncle     SOCIAL HX:  reports that she has never smoked. She has never used smokeless tobacco. She reports that she does not drink alcohol and does not use drugs.    Current Outpatient Medications:    acetaminophen  (TYLENOL ) 500 MG tablet, Take 2 tablets (1,000 mg total) by mouth every 8 (eight) hours as needed., Disp: 30 tablet, Rfl: 0   aspirin 81 MG tablet, Take 81 mg by mouth daily., Disp: , Rfl:    atorvastatin  (LIPITOR) 40 MG tablet, Take 1 tablet (40 mg total) by mouth daily., Disp: 90 tablet, Rfl: 3   buPROPion   (WELLBUTRIN  XL) 150 MG 24 hr tablet, Take 1 tablet (150 mg total) by mouth daily., Disp: 90 tablet, Rfl: 1   Cholecalciferol (VITAMIN D3) 1000 UNITS CAPS, Take 1 capsule by mouth daily., Disp: , Rfl:    cyanocobalamin  (VITAMIN B12) 1000 MCG/ML injection, INJECT 1ML INTO THE MUSCLE EVERY 30 DAYS, Disp: 10 mL, Rfl: 0   diphenhydrAMINE  (BENADRYL ) 25 mg capsule, Take 25 mg by mouth every 8 (eight) hours as needed for itching or allergies., Disp: , Rfl:    furosemide  (LASIX ) 20 MG tablet, Take 1 tablet (20 mg total) by mouth 2 (two) times a week. Tuesday and Friday, Disp: 30 tablet, Rfl: 1   hydrochlorothiazide  (HYDRODIURIL ) 25 MG tablet, Take 1 tablet (25 mg total) by mouth daily., Disp: 90 tablet, Rfl: 3   methocarbamol  (ROBAXIN ) 500  MG tablet, Take 1 tablet (500 mg total) by mouth at bedtime., Disp: 90 tablet, Rfl: 1   Omega-3 Fatty Acids (FISH OIL) 1000 MG CAPS, Take 1 capsule daily by mouth., Disp: , Rfl:    omeprazole  (PRILOSEC) 20 MG capsule, Take 1 capsule (20 mg total) by mouth daily., Disp: , Rfl:    ondansetron  (ZOFRAN ) 4 MG tablet, Take 1 tablet (4 mg total) by mouth every 8 (eight) hours as needed for nausea or vomiting., Disp: 20 tablet, Rfl: 0   OVER THE COUNTER MEDICATION, Take 1 tablet by mouth daily. Migrerelief, Disp: , Rfl:    pregabalin  (LYRICA ) 75 MG capsule, Take 1 capsule (75 mg total) by mouth 2 (two) times daily., Disp: 60 capsule, Rfl: 1   promethazine  (PHENERGAN ) 12.5 MG tablet, Take 12.5 mg by mouth every 6 (six) hours as needed for nausea or vomiting., Disp: , Rfl:    telmisartan  (MICARDIS ) 80 MG tablet, Take 1 tablet (80 mg total) by mouth daily., Disp: 90 tablet, Rfl: 1   warfarin (COUMADIN ) 4 MG tablet, Take 1 tablet (4 mg total) by mouth daily., Disp: 30 tablet, Rfl: 2   ALPRAZolam  (XANAX ) 0.25 MG tablet, TAKE 1 TABLET BY MOUTH AT BEDTIME AS NEEDED FOR ANXIETY, Disp: 30 tablet, Rfl: 2   metoprolol  succinate (TOPROL -XL) 50 MG 24 hr tablet, Take 1 tablet (50 mg total) by  mouth daily., Disp: 90 tablet, Rfl: 1  Current Facility-Administered Medications:    cyanocobalamin  ((VITAMIN B-12)) injection 1,000 mcg, 1,000 mcg, Intramuscular, Once, Marylynn, Verneita CROME, MD  EXAM:  VITALS per patient if applicable:  GENERAL: alert, oriented, appears well and in no acute distress  HEENT: atraumatic, conjunttiva clear, no obvious abnormalities on inspection of external nose and ears  NECK: normal movements of the head and neck  LUNGS: on inspection no signs of respiratory distress, breathing rate appears normal, no obvious gross SOB, gasping or wheezing  CV: no obvious cyanosis  MS: moves all visible extremities without noticeable abnormality  PSYCH/NEURO: pleasant and cooperative, no obvious depression or anxiety, speech and thought processing grossly intact  ASSESSMENT AND PLAN: Resistant hypertension Assessment & Plan: Suspending amlodipine  due to recurrent symptomatic hypotension.  Continue 50 mg metoprolol , hydrochlorothiazide  25 mg  and 80 mg telmisartan .    Insomnia due to anxiety and fear Assessment & Plan: Persistent,  since the death of her husband over one year ago.  Using alprazolam  without dose escalation. The risks and benefits of  Chronic  benzodiazepine use were discussed with patient today including increased risk of dementia,  Addiction, and seizures if abruptly withdrawn  .  Patient was encouraged to reduce use of clonazepam gradually,  Starting with reduction of one dose to 1/2 tablet and use buspirone if needed  .    Orders: -     ALPRAZolam ; TAKE 1 TABLET BY MOUTH AT BEDTIME AS NEEDED FOR ANXIETY  Dispense: 30 tablet; Refill: 2  Other orders -     Metoprolol  Succinate ER; Take 1 tablet (50 mg total) by mouth daily.  Dispense: 90 tablet; Refill: 1      I discussed the assessment and treatment plan with the patient. The patient was provided an opportunity to ask questions and all were answered. The patient agreed with the plan and  demonstrated an understanding of the instructions.   The patient was advised to call back or seek an in-person evaluation if the symptoms worsen or if the condition fails to improve as anticipated.    Verneita CROME  Marylynn, MD

## 2023-09-12 DIAGNOSIS — F5105 Insomnia due to other mental disorder: Secondary | ICD-10-CM | POA: Insufficient documentation

## 2023-09-12 MED ORDER — ALPRAZOLAM 0.25 MG PO TABS: ORAL_TABLET | ORAL | 2 refills | Status: AC

## 2023-09-12 MED ORDER — METOPROLOL SUCCINATE ER 50 MG PO TB24: 50.0000 mg | ORAL_TABLET | Freq: Every day | ORAL | 1 refills | Status: DC

## 2023-09-12 NOTE — Assessment & Plan Note (Addendum)
 Persistent,  since the death of her husband over one year ago.  Using alprazolam  without dose escalation. The risks and benefits of  Chronic  benzodiazepine use were discussed with patient today including increased risk of dementia,  Addiction, and seizures if abruptly withdrawn  .  Patient was encouraged to reduce use of clonazepam gradually,  Starting with reduction of one dose to 1/2 tablet and use buspirone if needed  .

## 2023-09-12 NOTE — Assessment & Plan Note (Addendum)
 Suspending amlodipine  due to recurrent symptomatic hypotension.  Continue 50 mg metoprolol , hydrochlorothiazide  25 mg  and 80 mg telmisartan .

## 2023-10-01 ENCOUNTER — Ambulatory Visit
Admission: RE | Admit: 2023-10-01 | Discharge: 2023-10-01 | Disposition: A | Discharge status: Home / Self Care | Source: Ambulatory Visit | Attending: Internal Medicine | Admitting: Internal Medicine

## 2023-10-01 DIAGNOSIS — Z1231 Encounter for screening mammogram for malignant neoplasm of breast: Secondary | ICD-10-CM | POA: Insufficient documentation

## 2023-10-07 ENCOUNTER — Telehealth: Payer: Self-pay | Admitting: Internal Medicine

## 2023-10-07 DIAGNOSIS — E782 Mixed hyperlipidemia: Secondary | ICD-10-CM

## 2023-10-07 DIAGNOSIS — R7303 Prediabetes: Secondary | ICD-10-CM

## 2023-10-07 DIAGNOSIS — Z7901 Long term (current) use of anticoagulants: Secondary | ICD-10-CM

## 2023-10-07 NOTE — Telephone Encounter (Signed)
 Copied from CRM [id number]. Topic: Appointments - Scheduling Inquiry for Clinic >> Oct 07, 2023  3:59 PM Precious C wrote: Reason for CRM: Pt called to requests labs for blood work to be done, and is requestion confirmation once the order is done

## 2023-10-07 NOTE — Telephone Encounter (Signed)
 Patient called to schedule a lab appointment. No orders, does patient need labs done?

## 2023-10-09 ENCOUNTER — Telehealth: Payer: Self-pay

## 2023-10-09 NOTE — Telephone Encounter (Signed)
 E2C2 SCHEDULED PT ON NV FOR A LAB APPT CAN THIS BE SWITCHED TOT HE APPROPRIATE SCHEDULE?

## 2023-10-09 NOTE — Addendum Note (Signed)
 Addended by: MARYLYNN VERNEITA CROME on: 10/09/2023 05:31 PM   Modules accepted: Orders

## 2023-10-12 ENCOUNTER — Other Ambulatory Visit (INDEPENDENT_AMBULATORY_CARE_PROVIDER_SITE_OTHER)

## 2023-10-12 DIAGNOSIS — Z7901 Long term (current) use of anticoagulants: Secondary | ICD-10-CM | POA: Diagnosis not present

## 2023-10-12 NOTE — Telephone Encounter (Signed)
Patient is scheduled for labs today.

## 2023-10-12 NOTE — Addendum Note (Signed)
 Addended by: TANDA HARVEY D on: 10/12/2023 11:31 AM   Modules accepted: Orders

## 2023-10-13 LAB — PROTIME-INR
INR: 2.6 — ABNORMAL HIGH (ref 0.9–1.2)
Prothrombin Time: 26.6 s — ABNORMAL HIGH (ref 9.1–12.0)

## 2023-10-14 ENCOUNTER — Ambulatory Visit: Payer: Self-pay | Admitting: Internal Medicine

## 2023-11-12 ENCOUNTER — Other Ambulatory Visit: Payer: Self-pay | Admitting: Internal Medicine

## 2023-11-12 ENCOUNTER — Other Ambulatory Visit: Payer: Self-pay

## 2023-11-12 MED ORDER — METHOCARBAMOL 500 MG PO TABS
500.0000 mg | ORAL_TABLET | Freq: Every day | ORAL | 1 refills | Status: DC
  Filled 2023-11-12: qty 90, 90d supply, fill #0

## 2023-11-27 ENCOUNTER — Telehealth: Payer: Self-pay | Admitting: Internal Medicine

## 2023-11-27 DIAGNOSIS — Z7901 Long term (current) use of anticoagulants: Secondary | ICD-10-CM

## 2023-11-27 NOTE — Telephone Encounter (Signed)
 Patient need order for INR

## 2023-12-01 ENCOUNTER — Other Ambulatory Visit (INDEPENDENT_AMBULATORY_CARE_PROVIDER_SITE_OTHER)

## 2023-12-01 ENCOUNTER — Ambulatory Visit: Payer: Self-pay | Admitting: Internal Medicine

## 2023-12-01 DIAGNOSIS — Z7901 Long term (current) use of anticoagulants: Secondary | ICD-10-CM

## 2023-12-01 LAB — PROTIME-INR
INR: 2.2 ratio — ABNORMAL HIGH (ref 0.8–1.0)
Prothrombin Time: 22.1 s — ABNORMAL HIGH (ref 9.6–13.1)

## 2023-12-24 ENCOUNTER — Other Ambulatory Visit: Payer: Self-pay | Admitting: Internal Medicine

## 2024-01-04 ENCOUNTER — Other Ambulatory Visit: Payer: Self-pay | Admitting: Internal Medicine

## 2024-01-05 NOTE — Telephone Encounter (Signed)
 Refilled: 07/07/2023 Last OV: 09/09/2023 Next OV: 01/12/2024

## 2024-01-12 ENCOUNTER — Encounter: Payer: Self-pay | Admitting: Internal Medicine

## 2024-01-12 ENCOUNTER — Ambulatory Visit: Admitting: Internal Medicine

## 2024-01-12 VITALS — BP 138/89 | HR 72 | Temp 98.0°F | Ht 61.0 in | Wt 164.6 lb

## 2024-01-12 DIAGNOSIS — R0683 Snoring: Secondary | ICD-10-CM

## 2024-01-12 DIAGNOSIS — Z6831 Body mass index (BMI) 31.0-31.9, adult: Secondary | ICD-10-CM | POA: Diagnosis not present

## 2024-01-12 DIAGNOSIS — E782 Mixed hyperlipidemia: Secondary | ICD-10-CM | POA: Diagnosis not present

## 2024-01-12 DIAGNOSIS — N1831 Chronic kidney disease, stage 3a: Secondary | ICD-10-CM

## 2024-01-12 DIAGNOSIS — F4381 Prolonged grief disorder: Secondary | ICD-10-CM

## 2024-01-12 DIAGNOSIS — Z7901 Long term (current) use of anticoagulants: Secondary | ICD-10-CM | POA: Diagnosis not present

## 2024-01-12 DIAGNOSIS — R7303 Prediabetes: Secondary | ICD-10-CM

## 2024-01-12 DIAGNOSIS — E1169 Type 2 diabetes mellitus with other specified complication: Secondary | ICD-10-CM | POA: Diagnosis not present

## 2024-01-12 DIAGNOSIS — M791 Myalgia, unspecified site: Secondary | ICD-10-CM | POA: Diagnosis not present

## 2024-01-12 DIAGNOSIS — Z23 Encounter for immunization: Secondary | ICD-10-CM

## 2024-01-12 DIAGNOSIS — E785 Hyperlipidemia, unspecified: Secondary | ICD-10-CM | POA: Diagnosis not present

## 2024-01-12 LAB — COMPREHENSIVE METABOLIC PANEL WITH GFR
ALT: 19 U/L (ref 3–35)
AST: 20 U/L (ref 5–37)
Albumin: 4.6 g/dL (ref 3.5–5.2)
Alkaline Phosphatase: 79 U/L (ref 39–117)
BUN: 18 mg/dL (ref 6–23)
CO2: 32 meq/L (ref 19–32)
Calcium: 9.6 mg/dL (ref 8.4–10.5)
Chloride: 101 meq/L (ref 96–112)
Creatinine, Ser: 1.06 mg/dL (ref 0.40–1.20)
GFR: 51.65 mL/min — ABNORMAL LOW
Glucose, Bld: 101 mg/dL — ABNORMAL HIGH (ref 70–99)
Potassium: 4.1 meq/L (ref 3.5–5.1)
Sodium: 142 meq/L (ref 135–145)
Total Bilirubin: 0.7 mg/dL (ref 0.2–1.2)
Total Protein: 6.7 g/dL (ref 6.0–8.3)

## 2024-01-12 LAB — LIPID PANEL
Cholesterol: 136 mg/dL (ref 28–200)
HDL: 51.5 mg/dL
LDL Cholesterol: 56 mg/dL (ref 10–99)
NonHDL: 84.68
Total CHOL/HDL Ratio: 3
Triglycerides: 141 mg/dL (ref 10.0–149.0)
VLDL: 28.2 mg/dL (ref 0.0–40.0)

## 2024-01-12 LAB — PROTIME-INR
INR: 2.1 ratio — ABNORMAL HIGH (ref 0.8–1.0)
Prothrombin Time: 21.7 s — ABNORMAL HIGH (ref 9.6–13.1)

## 2024-01-12 LAB — CK: Total CK: 125 U/L (ref 17–177)

## 2024-01-12 LAB — HEMOGLOBIN A1C: Hgb A1c MFr Bld: 6.4 % (ref 4.6–6.5)

## 2024-01-12 MED ORDER — TELMISARTAN 80 MG PO TABS: 80.0000 mg | ORAL_TABLET | Freq: Every day | ORAL | 1 refills | Status: AC

## 2024-01-12 MED ORDER — METOPROLOL SUCCINATE ER 50 MG PO TB24: 50.0000 mg | ORAL_TABLET | Freq: Every day | ORAL | 1 refills | Status: AC

## 2024-01-12 MED ORDER — FUROSEMIDE 20 MG PO TABS: 20.0000 mg | ORAL_TABLET | ORAL | 1 refills | Status: AC

## 2024-01-12 MED ORDER — WARFARIN SODIUM 4 MG PO TABS: ORAL_TABLET | ORAL | 2 refills | Status: DC

## 2024-01-12 MED ORDER — ATORVASTATIN CALCIUM 40 MG PO TABS: 40.0000 mg | ORAL_TABLET | Freq: Every day | ORAL | 3 refills | Status: AC

## 2024-01-12 MED ORDER — ONDANSETRON HCL 4 MG PO TABS: 4.0000 mg | ORAL_TABLET | Freq: Three times a day (TID) | ORAL | 0 refills | Status: AC | PRN

## 2024-01-12 MED ORDER — TIZANIDINE HCL 4 MG PO TABS: 4.0000 mg | ORAL_TABLET | Freq: Four times a day (QID) | ORAL | 0 refills | Status: DC | PRN

## 2024-01-12 MED ORDER — BUPROPION HCL ER (XL) 150 MG PO TB24: 150.0000 mg | ORAL_TABLET | Freq: Every day | ORAL | 1 refills | Status: AC

## 2024-01-12 MED ORDER — TRAZODONE HCL 50 MG PO TABS: 25.0000 mg | ORAL_TABLET | Freq: Every evening | ORAL | 3 refills | Status: AC | PRN

## 2024-01-12 MED ORDER — HYDROCHLOROTHIAZIDE 25 MG PO TABS: 25.0000 mg | ORAL_TABLET | Freq: Every day | ORAL | 3 refills | Status: AC

## 2024-01-12 NOTE — Assessment & Plan Note (Signed)
 Home sleep study ordered

## 2024-01-12 NOTE — Assessment & Plan Note (Signed)
 CI is normal she attributes symptoms to statin.  Offered a 3 month suspension   Lab Results  Component Value Date   CKTOTAL 125 01/12/2024   CKMB < 0.5 (L) 05/24/2011   TROPONINI <0.03 11/22/2015

## 2024-01-12 NOTE — Patient Instructions (Addendum)
 Increase your dose of wellbutrin  to 2 tablets daily in the morning  Resume trazodone  at bedtime  Try listening to [employer] at night. On   Youtube   For the itching:  Use moisturizer   NO FRAGRANCE    CHANGE IN MUSCLE RELAXER: STOP METHOCARBAMOL  AND START TIZANIDINE .   You might also add  Relaxium for insomnia  (it's otc and sold on [employer]) . It is available through [employer] and contains all natural supplements:  Melatonin 5 mg  Chamomile 25 mg Passionflower extract 75 mg GABA 100 mg Ashwaganda extract 125 mg Magnesium citrate, glycinate, oxide (100 mg)  L tryptophan 500 mg Valerest (proprietary  ingredient ; probably valeria root extract)    Referral for therapy and sleep study in progress .  Please look for their calls

## 2024-01-21 ENCOUNTER — Telehealth: Payer: Self-pay

## 2024-01-21 ENCOUNTER — Other Ambulatory Visit: Payer: Self-pay | Admitting: Internal Medicine

## 2024-01-21 NOTE — Telephone Encounter (Signed)
 Copied from CRM [id number]. Topic: Clinical - Medication Question >> Jan 21, 2024  3:01 PM Shereese L wrote: Reason for CRM: Patient called in and stated that the medication tiZANidine  (ZANAFLEX ) 4 MG tablet has given her a bad reaction and she was adv by the pharmacist to stop taking the medication. She stated that she will like a call back to discuss the fact that it made her feel drunk, dry mouth, stumbling, passed out

## 2024-01-22 NOTE — Telephone Encounter (Signed)
 Called and spoke with pt.  She reports having a reaction to Tizanidine  last week.  She took doses Wed, Thu and Fri night.  She reports having hallucinations, dizziness and felt drunk. Pt called her pharmacy on Saturday since office was closed, Pharmacist advised her to discontinue medication.  Pt reports that she now back to baseline.  Medication has been discontinued from chart and I have entered it as an allergy.  Pt reports that she is no longer having muscle spasms and does not feel that she needs a replacement for tizanidine .

## 2024-01-27 ENCOUNTER — Other Ambulatory Visit (HOSPITAL_COMMUNITY): Payer: Self-pay

## 2024-01-27 ENCOUNTER — Telehealth: Payer: Self-pay

## 2024-01-27 NOTE — Telephone Encounter (Signed)
 Pharmacy Patient Advocate Encounter   Received notification from Physician's Office that prior authorization for Ondansetron  HCl 4MG  tablets  is required/requested.   Insurance verification completed.   The patient is insured through [facility] ADV[employer]/RX ADV[employer].   Per test claim: PA required; PA submitted to above mentioned insurance via Latent Key/confirmation #/EOC [id number] Status is pending

## 2024-01-28 IMAGING — MG MM DIGITAL SCREENING BILAT W/ TOMO AND CAD
6 of 10 series · 6 of 30 positions shown · non-contrast
Comparison: Previous exam(s).

CLINICAL DATA: Screening.

EXAM:
DIGITAL SCREENING BILATERAL MAMMOGRAM WITH TOMOSYNTHESIS AND CAD
TECHNIQUE: Bilateral screening digital craniocaudal and mediolateral oblique
mammograms were obtained. Bilateral screening digital breast
tomosynthesis was performed. The images were evaluated with
computer-aided detection.

[L CC synth-2D]
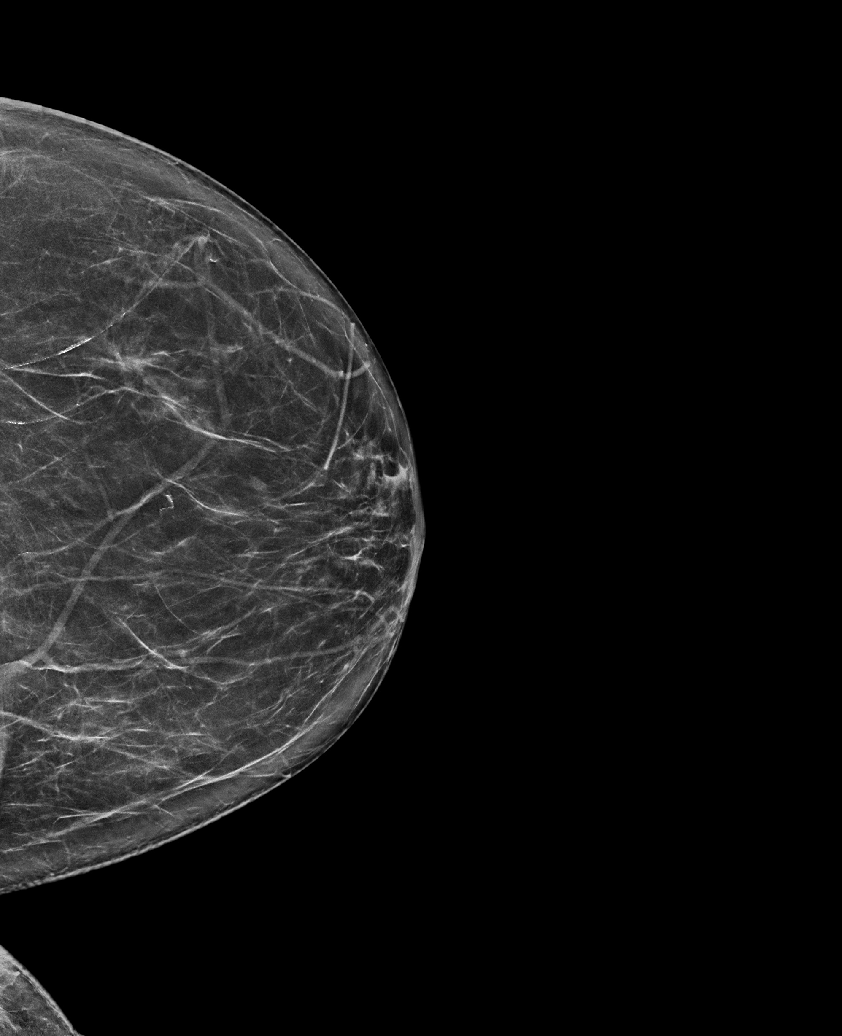

[L MLO synth-2D]
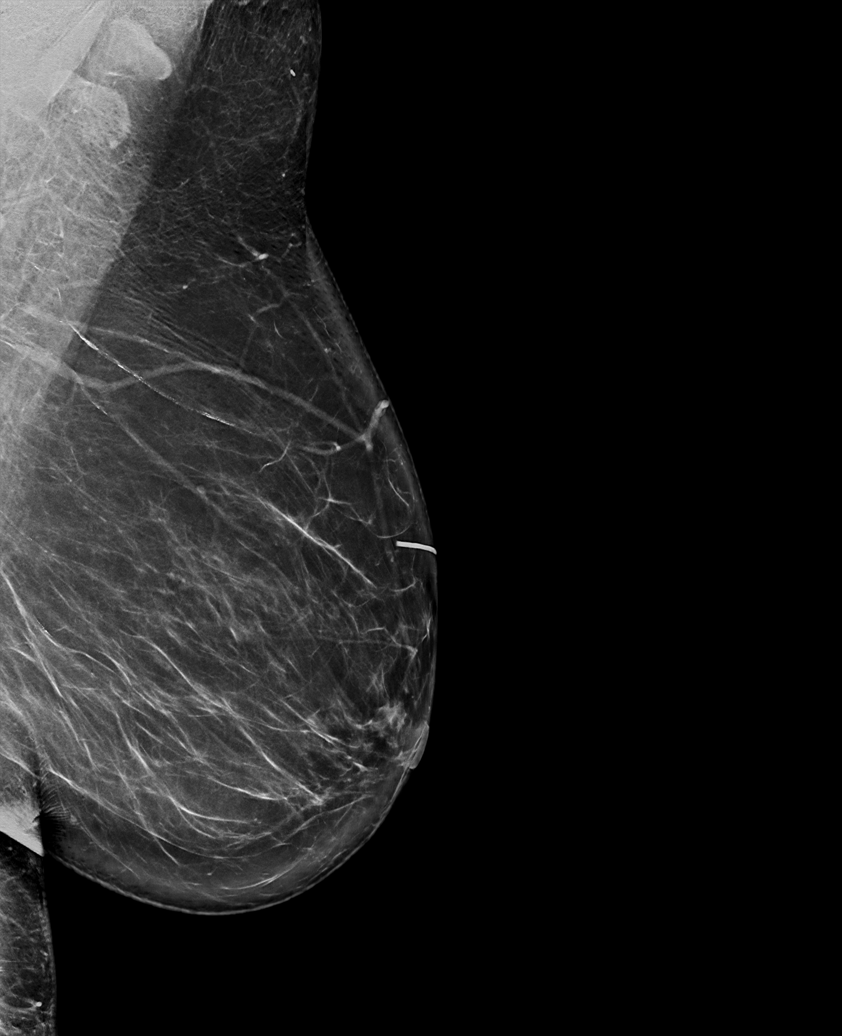

[R CC synth-2D (1 of 2)]
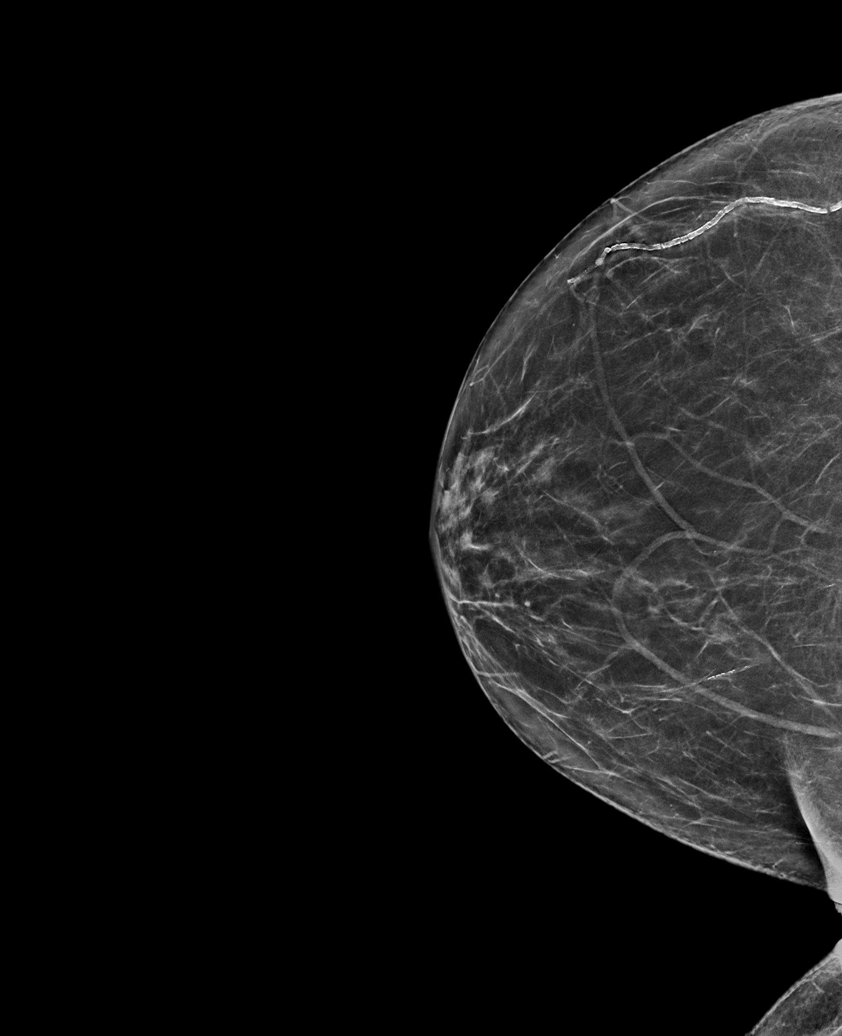

[R MLO synth-2D]
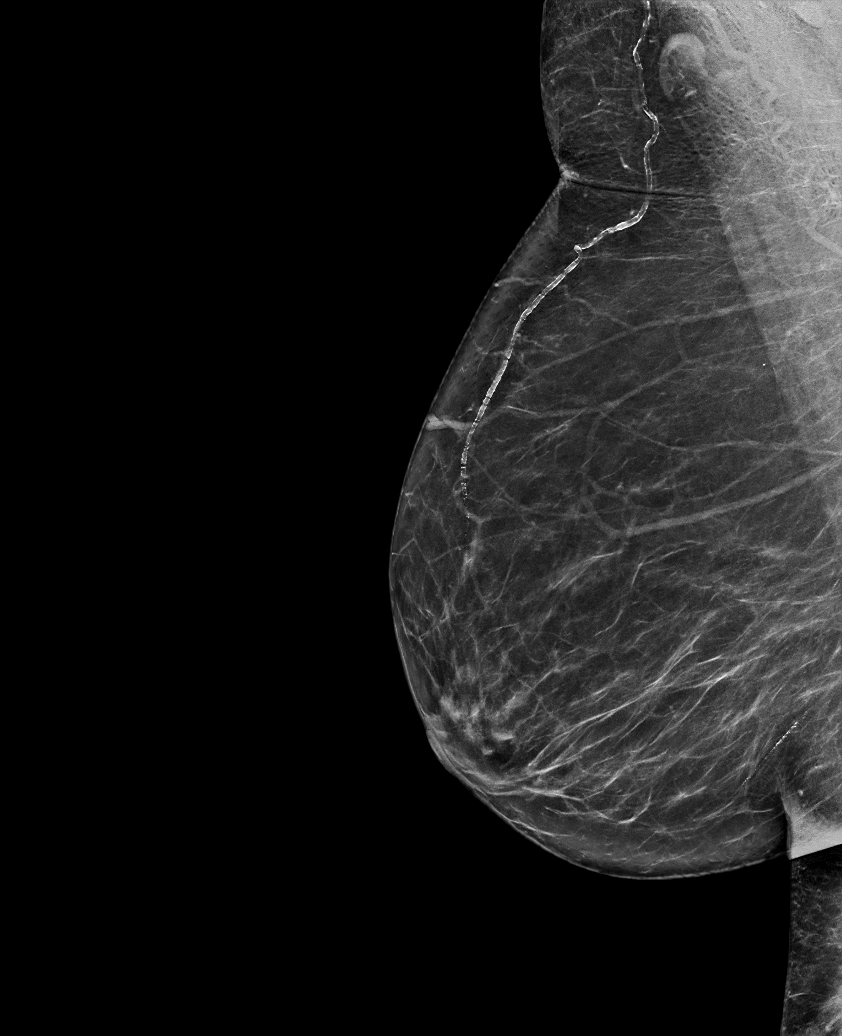

[R CC synth-2D (2 of 2)]
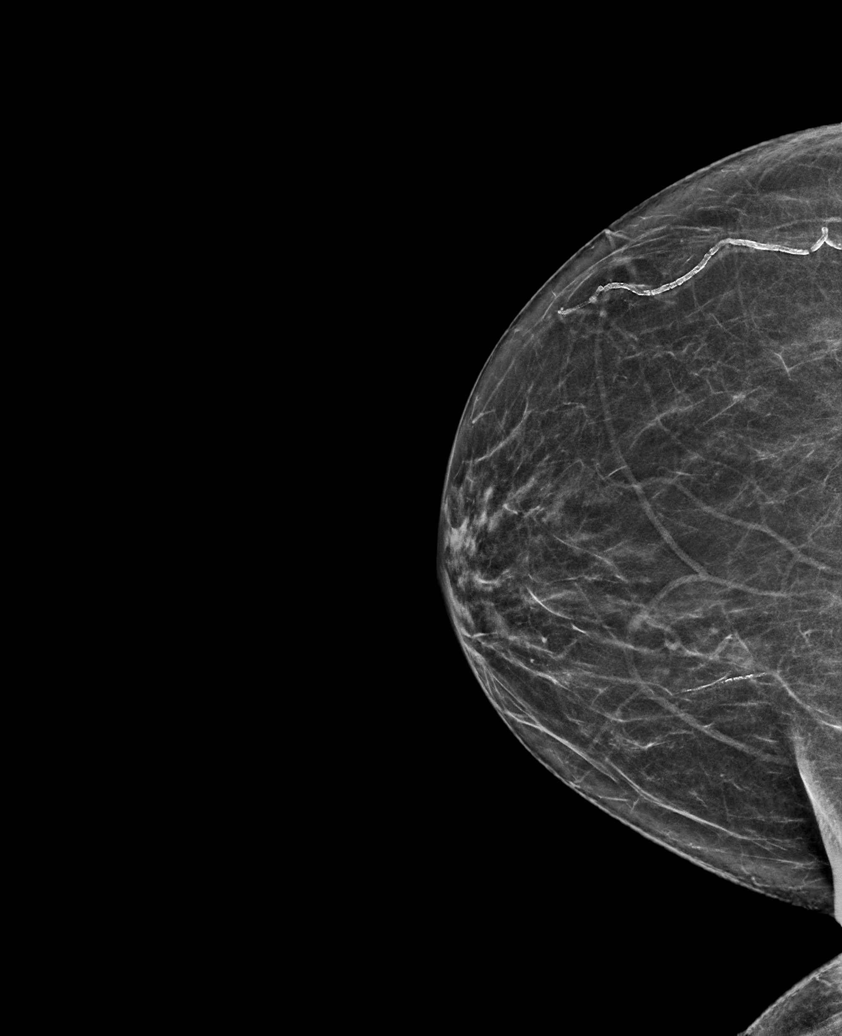

[L MLO tomo · tomo slice 45/90.0]
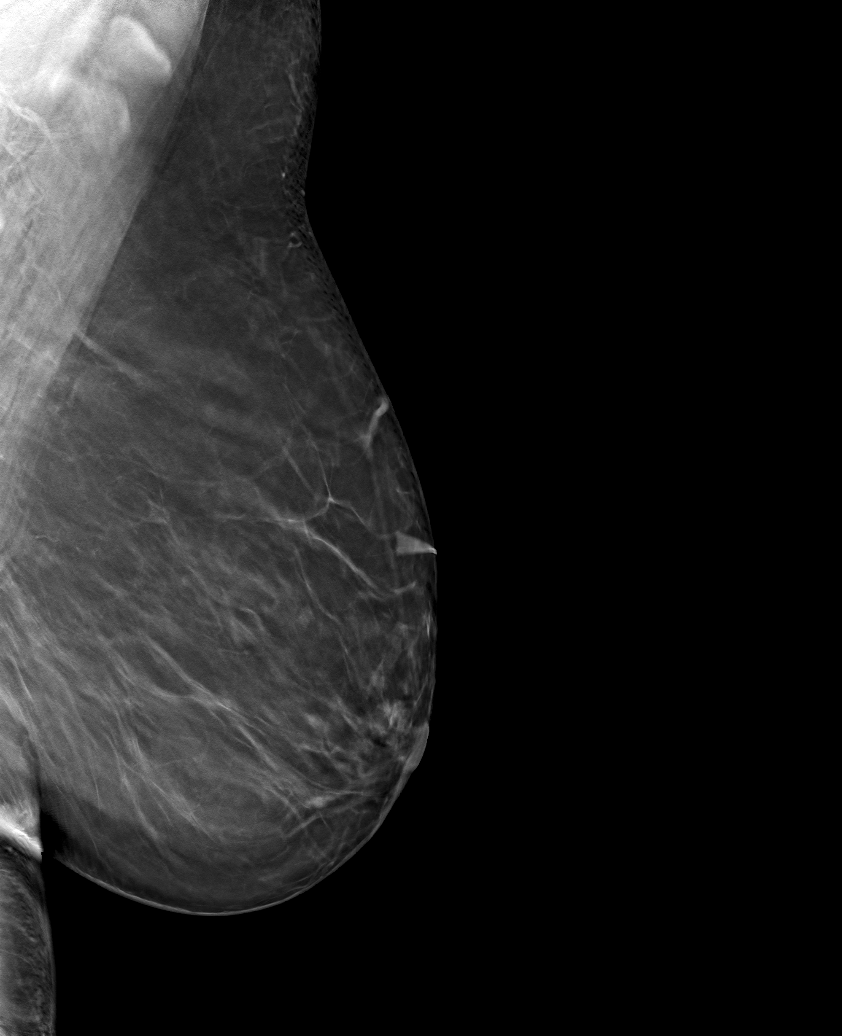

[6 of 30 positions shown; findings below may reference images not displayed]

ACR Breast Density Category b: There are scattered areas of
fibroglandular density.
FINDINGS: There are no findings suspicious for malignancy.
IMPRESSION: No mammographic evidence of malignancy. A result letter of this
screening mammogram will be mailed directly to the patient.

RECOMMENDATION:
Screening mammogram in one year. (Code:51-O-LD2)

BI-RADS CATEGORY  1: Negative.

## 2024-01-29 ENCOUNTER — Ambulatory Visit: Attending: Cardiology | Admitting: Cardiology

## 2024-01-29 ENCOUNTER — Other Ambulatory Visit (HOSPITAL_COMMUNITY): Payer: Self-pay

## 2024-01-29 VITALS — BP 116/60 | HR 62 | Ht 61.5 in | Wt 162.8 lb

## 2024-01-29 DIAGNOSIS — I10 Essential (primary) hypertension: Secondary | ICD-10-CM | POA: Diagnosis not present

## 2024-01-29 DIAGNOSIS — E7800 Pure hypercholesterolemia, unspecified: Secondary | ICD-10-CM | POA: Diagnosis not present

## 2024-01-29 NOTE — Patient Instructions (Signed)

## 2024-01-29 NOTE — Progress Notes (Signed)
 " Cardiology Office Note:    Date:  01/29/2024   ID:  [name], DOB Dec 24, 1950, MRN [id number]  PCP:  Marylynn Verneita CROME, MD  Cardiologist:  Redell Cave, MD  Electrophysiologist:  None   Referring MD: Marylynn Verneita CROME, MD   Chief Complaint  Patient presents with   Follow-up    12 month follow up visit. Patient states that she is feeling fine on today.  Meds reviewed.     History of Present Illness:    [name] is a 74 y.o. female with a hx of hypertension, hyperlipidemia, protein C deficiency, PE on warfarin who presents for follow-up.  Doing okay, denies chest pain or shortness of breath.  Denies chest pain or shortness of breath.  Feels well, has no concerns at this time.  Previously had a loop history of bradycardia with Toprol -XL.developed dizziness/hypotension, Norvasc  was stopped.  Toprol -XL restarted by PCP, current heart rates in 60s.  Doing okay, no concerns at this time.  Prior notes Echo 12/2018 normal systolic and diastolic function, EF 60% Coronary CTA 01/2019 no angiographic evidence of CAD History of bradycardia with Toprol -XL.  Past Medical History:  Diagnosis Date   Anticoagulant long-term use    Anticoagulated with warfarin    a.) lifelong anticoagulation (initiated in 2011) due to lab confirmed (+) lupus anticoagulant and Protein C&S deficiencies   Anxiety 2013   Aortic atherosclerosis    B12 deficiency    Chronic kidney disease    Clotting disorder 2103   a.) hypercoagulable workup 08/08/2009: (+) lupus anticoagulant (dPT 61.4; confirm ratio 1.29; dRVVT 49.4); Factor V leiden (-), Factor II (-), Protein C (functional 41%, Ag 46%); Protein S (total 74%, free 77%, functional 58%); ATIII (Ag 80%; activity 80%)   Depression 2016   Diabetes mellitus without complication (HCC) 2017   GERD (gastroesophageal reflux disease) 2013   H/O toxic shock syndrome 2001   post urologic procedure for stone removal   History of bilateral cataract extraction 2021    History of kidney stones    History of pulmonary embolus (PE) 09/2009   a.) found to have (+) 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   HOH (hard of hearing); wears BILATERAL hearing aids    Hyperlipidemia    Hypertension    Ischemic colitis    a.) s/p left hemicolectomy 05/2011   Long-term use of aspirin therapy    Migraines    PONV (postoperative nausea and vomiting)    Pre-diabetes     Past Surgical History:  Procedure Laterality Date   ABDOMINAL HYSTERECTOMY  1996   APPENDECTOMY  1976   bilateral tubal ligation     BREAST EXCISIONAL BIOPSY Left 1989   benign   BREAST SURGERY  1989   left biopsy, normal   CARPAL TUNNEL RELEASE Right 04/28/2023   Procedure: CARPAL TUNNEL RELEASE;  Surgeon: Claudene Penne ORN, MD;  Location: [facility];  Service: Neurosurgery;  Laterality: Right;   CATARACT EXTRACTION W/PHACO Right 05/30/2019   Procedure: CATARACT EXTRACTION PHACO AND INTRAOCULAR LENS PLACEMENT (IOC) RIGHT DIABETIC;  Surgeon: Myrna Adine Anes, MD;  Location: [facility];  Service: Ophthalmology;  Laterality: Right;  3.84 0:37.1   CATARACT EXTRACTION W/PHACO Left 06/20/2019   Procedure: CATARACT EXTRACTION PHACO AND INTRAOCULAR LENS PLACEMENT (IOC) LEFT DIABETIC 2.58  00:29.3;  Surgeon: Myrna Adine Anes, MD;  Location: [facility];  Service: Ophthalmology;  Laterality: Left;  Diabetic   COLECTOMY Left 05/2011   [place], ischemic colitis  COLONOSCOPY WITH PROPOFOL  N/A 11/16/2014   Procedure: COLONOSCOPY WITH PROPOFOL ;  Surgeon: Lamar ONEIDA Holmes, MD;  Location: [facility];  Service: Endoscopy;  Laterality: N/A;   COLONOSCOPY WITH PROPOFOL  N/A 07/12/2021   Procedure: COLONOSCOPY WITH PROPOFOL ;  Surgeon: Maryruth Ole ONEIDA, MD;  Location: [facility] END[facility];  Service: Endoscopy;  Laterality: N/A;   ESOPHAGOGASTRODUODENOSCOPY (EGD) WITH PROPOFOL  N/A 11/16/2014   Procedure: ESOPHAGOGASTRODUODENOSCOPY (EGD) WITH PROPOFOL ;  Surgeon: Lamar ONEIDA Holmes, MD;   Location: [facility];  Service: Endoscopy;  Laterality: N/A;   ESOPHAGOGASTRODUODENOSCOPY (EGD) WITH PROPOFOL  N/A 07/12/2021   Procedure: ESOPHAGOGASTRODUODENOSCOPY (EGD) WITH PROPOFOL ;  Surgeon: Maryruth Ole ONEIDA, MD;  Location: [facility] ENDOSCOPY;  Service: Endoscopy;  Laterality: N/A;   EYE SURGERY  2016/2017   tear ducts   IVC FILTER INSERTION     temporary   SPINE SURGERY  1984   lumbar diskectomy, [place]   TUBAL LIGATION  1976   ureterolithotomy     Dr. Waddell urology ~ 2012 kidney stones in ICU after surgery    Current Medications: Current Meds  Medication Sig   acetaminophen  (TYLENOL ) 500 MG tablet Take 2 tablets (1,000 mg total) by mouth every 8 (eight) hours as needed.   aspirin 81 MG tablet Take 81 mg by mouth daily.   atorvastatin  (LIPITOR) 40 MG tablet Take 1 tablet (40 mg total) by mouth daily.   buPROPion  (WELLBUTRIN  XL) 150 MG 24 hr tablet Take 1 tablet (150 mg total) by mouth daily.   Cholecalciferol (VITAMIN D3) 1000 UNITS CAPS Take 1 capsule by mouth daily.   diphenhydrAMINE  (BENADRYL ) 25 mg capsule Take 25 mg by mouth every 8 (eight) hours as needed for itching or allergies.   furosemide  (LASIX ) 20 MG tablet Take 1 tablet (20 mg total) by mouth 2 (two) times a week. Tuesday and Friday   hydrochlorothiazide  (HYDRODIURIL ) 25 MG tablet Take 1 tablet (25 mg total) by mouth daily.   metoprolol  succinate (TOPROL -XL) 50 MG 24 hr tablet Take 1 tablet (50 mg total) by mouth daily.   Omega-3 Fatty Acids (FISH OIL) 1000 MG CAPS Take 1 capsule daily by mouth.   omeprazole  (PRILOSEC) 20 MG capsule Take 1 capsule (20 mg total) by mouth daily.   ondansetron  (ZOFRAN ) 4 MG tablet Take 1 tablet (4 mg total) by mouth every 8 (eight) hours as needed for nausea or vomiting.   OVER THE COUNTER MEDICATION Take 1 tablet by mouth daily. Migrerelief   pregabalin  (LYRICA ) 75 MG capsule Take 1 capsule by mouth twice daily   telmisartan  (MICARDIS ) 80 MG tablet Take 1 tablet (80 mg total) by  mouth daily.   tiZANidine  (ZANAFLEX ) 4 MG tablet Take 1 tablet (4 mg total) by mouth every 6 (six) hours as needed for muscle spasms.   traZODone  (DESYREL ) 50 MG tablet Take 0.5-1 tablets (25-50 mg total) by mouth at bedtime as needed for sleep.   warfarin (COUMADIN ) 4 MG tablet TAKE 1 TABLET BY MOUTH ONCE DAILY ALTERNATING WITH 5MG  DAILY   Current Facility-Administered Medications for the 01/29/24 encounter (Office Visit) with Darliss Rogue, MD  Medication   cyanocobalamin  ((VITAMIN B-12)) injection 1,000 mcg     Allergies:   Sulfamethoxazole -trimethoprim  and Tizanidine    Social History   Socioeconomic History   Marital status: Widowed    Spouse name: Not on file   Number of children: Not on file   Years of education: Not on file   Highest education level: Not on file  Occupational History   Not on file  Tobacco  Use   Smoking status: Never   Smokeless tobacco: Never  Vaping Use   Vaping status: Never Used  Substance and Sexual Activity   Alcohol use: No   Drug use: No   Sexual activity: Yes  Other Topics Concern   Not on file  Social History Narrative   Not on file   Social Drivers of Health   Tobacco Use: Low Risk (01/12/2024)   Patient History    Smoking Tobacco Use: Never    Smokeless Tobacco Use: Never    Passive Exposure: Not on file  Financial Resource Strain: Low Risk (08/25/2023)   Overall Financial Resource Strain (CARDIA)    Difficulty of Paying Living Expenses: Not hard at all  Food Insecurity: No Food Insecurity (08/25/2023)   Epic    Worried About Programme Researcher, Broadcasting/film/video in the Last Year: Never true    Ran Out of Food in the Last Year: Never true  Transportation Needs: No Transportation Needs (08/25/2023)   Epic    Lack of Transportation (Medical): No    Lack of Transportation (Non-Medical): No  Physical Activity: Inactive (08/25/2023)   Exercise Vital Sign    Days of Exercise per Week: 0 days    Minutes of Exercise per Session: 0 min  Stress: No  Stress Concern Present (08/25/2023)   [employer] of Occupational Health - Occupational Stress Questionnaire    Feeling of Stress: Only a little  Social Connections: Moderately Integrated (08/25/2023)   Social Connection and Isolation Panel    Frequency of Communication with Friends and Family: More than three times a week    Frequency of Social Gatherings with Friends and Family: More than three times a week    Attends Religious Services: More than 4 times per year    Active Member of [employer] or Organizations: Yes    Attends Banker Meetings: More than 4 times per year    Marital Status: Widowed  Depression (PHQ2-9): High Risk (01/12/2024)   Depression (PHQ2-9)    PHQ-2 Score: 22  Alcohol Screen: Low Risk (08/25/2023)   Alcohol Screen    Last Alcohol Screening Score (AUDIT): 0  Housing: Unknown (08/25/2023)   Epic    Unable to Pay for Housing in the Last Year: No    Number of Times Moved in the Last Year: Not on file    Homeless in the Last Year: No  Utilities: Not At Risk (08/25/2023)   Epic    Threatened with loss of utilities: No  Health Literacy: Adequate Health Literacy (08/25/2023)   B1300 Health Literacy    Frequency of need for help with medical instructions: Never     Family History: The patient's family history includes Alzheimer's disease in her sister; Breast cancer in her cousin and paternal aunt; Breast cancer (age of onset: 34) in her sister; COPD in her mother; Cancer in her mother; Congestive Heart Failure in her mother; Diabetes in her father and mother; Heart disease in her father and paternal uncle; Hypertension in her father and mother; Stroke in her father.  ROS:   Please see the history of present illness.     All other systems reviewed and are negative.  EKGs/Labs/Other Studies Reviewed:    The following studies were reviewed today:   EKG Interpretation Date/Time:  Friday January 29 2024 11:01:22 EST Ventricular Rate:  62 PR  Interval:  152 QRS Duration:  88 QT Interval:  442 QTC Calculation: 448 R Axis:   22  Text Interpretation: Normal  sinus rhythm ST & T wave abnormality, consider anterolateral ischemia Confirmed by Darliss Rogue ([id number]) on 01/29/2024 11:08:43 AM     Recent Labs: 04/23/2023: Hemoglobin 13.4; Platelets 172 01/12/2024: ALT 19; BUN 18; Creatinine, Ser 1.06; Potassium 4.1; Sodium 142  Recent Lipid Panel    Component Value Date/Time   CHOL 136 01/12/2024 1357   CHOL 176 08/08/2019 1544   TRIG 141.0 01/12/2024 1357   HDL 51.50 01/12/2024 1357   HDL 54 08/08/2019 1544   CHOLHDL 3 01/12/2024 1357   VLDL 28.2 01/12/2024 1357   LDLCALC 56 01/12/2024 1357   LDLCALC 64 10/28/2021 1353   LDLDIRECT 73.0 07/14/2023 1445    Physical Exam:    VS:  BP 116/60 (BP Location: Left Arm, Patient Position: Sitting, Cuff Size: Normal)   Pulse 62   Ht 5' 1.5 (1.562 m)   Wt 162 lb 12.8 oz (73.8 kg)   SpO2 97%   BMI 30.26 kg/m     Wt Readings from Last 3 Encounters:  01/29/24 162 lb 12.8 oz (73.8 kg)  01/12/24 164 lb 9.6 oz (74.7 kg)  09/09/23 162 lb (73.5 kg)     GEN:  Well nourished, well developed in no acute distress HEENT: Normal NECK: No JVD; No carotid bruits CARDIAC: RRR, no murmurs, rubs, gallops RESPIRATORY:  Clear to auscultation without rales, wheezing or rhonchi  ABDOMEN: Soft, non-tender, non-distended MUSCULOSKELETAL:  No edema; No deformity  SKIN: Warm and dry NEUROLOGIC:  Alert and oriented x 3 PSYCHIATRIC:  Normal affect   ASSESSMENT:    1. Primary hypertension   2. Pure hypercholesterolemia    PLAN:    In order of problems listed above:  Hypertension, BP controlled. Continue Micardis  80 mg daily, HCTZ 25 mg daily. Hyperlipidemia, cholesterol controlled continue Lipitor 40 mg daily.  Follow-up as needed.   Medication Adjustments/Labs and Tests Ordered: Current medicines are reviewed at length with the patient today.  Concerns regarding medicines are outlined  above.  Orders Placed This Encounter  Procedures   EKG 12-Lead   No orders of the defined types were placed in this encounter.   Patient Instructions  Medication Instructions:  Your physician recommends that you continue on your current medications as directed. Please refer to the Current Medication list given to you today.   *If you need a refill on your cardiac medications before your next appointment, please call your pharmacy*  Lab Work: No labs ordered today  If you have labs (blood work) drawn today and your tests are completely normal, you will receive your results only by: MyChart Message (if you have MyChart) OR A paper copy in the mail If you have any lab test that is abnormal or we need to change your treatment, we will call you to review the results.  Testing/Procedures: No test ordered today   Follow-Up: At [facility], you and your health needs are our priority.  As part of our continuing mission to provide you with exceptional heart care, our providers are all part of one team.  This team includes your primary Cardiologist (physician) and Advanced Practice Providers or APPs (Physician Assistants and Nurse Practitioners) who all work together to provide you with the care you need, when you need it.  Your next appointment:   As needed              Signed, Rogue Darliss, MD  01/29/2024 12:42 PM    [facility] "

## 2024-02-01 ENCOUNTER — Ambulatory Visit: Admitting: Cardiology

## 2024-02-02 ENCOUNTER — Other Ambulatory Visit (HOSPITAL_COMMUNITY): Payer: Self-pay

## 2024-02-02 NOTE — Telephone Encounter (Signed)
 FYI Zofran  was denied by insurance.

## 2024-02-02 NOTE — Telephone Encounter (Signed)
 Pharmacy Patient Advocate Encounter  Received notification from [facility] [employer] that Prior Authorization for Ondansetron  HCl 4MG  tablets  has been DENIED.  Full denial letter will be uploaded to the media tab. See denial reason below.   drug not prescribed for medically accepted diagnosis. Indication only covered for chemo/radiation or post surgery recovery.     PA #/Case ID/Reference #: [id number]

## 2024-02-03 ENCOUNTER — Telehealth: Payer: Self-pay | Admitting: Internal Medicine

## 2024-02-03 ENCOUNTER — Other Ambulatory Visit: Payer: Self-pay

## 2024-02-03 DIAGNOSIS — E538 Deficiency of other specified B group vitamins: Secondary | ICD-10-CM

## 2024-02-03 DIAGNOSIS — R0683 Snoring: Secondary | ICD-10-CM

## 2024-02-03 MED ORDER — PROMETHAZINE HCL 12.5 MG PO TABS: 12.5000 mg | ORAL_TABLET | Freq: Three times a day (TID) | ORAL | 0 refills | Status: AC | PRN

## 2024-02-03 MED ORDER — CYANOCOBALAMIN 1000 MCG/ML IJ SOLN: INTRAMUSCULAR | 0 refills | Status: AC

## 2024-02-03 MED ORDER — "SYRINGE 25G X 1"" 3 ML MISC": 0 refills | Status: AC

## 2024-02-03 NOTE — Telephone Encounter (Signed)
 Sleep study results placed in yellow folder for review.

## 2024-02-03 NOTE — Telephone Encounter (Signed)
 James from [facility] dropped off sleep study results for this pt. They're in the color folder up front

## 2024-02-03 NOTE — Telephone Encounter (Signed)
 Pt is aware of the alternative that was sent in.

## 2024-02-03 NOTE — Telephone Encounter (Signed)
 Pt would like to have an alternative medication sent in.

## 2024-02-03 NOTE — Addendum Note (Signed)
 Addended by: MARYLYNN VERNEITA CROME on: 02/03/2024 04:34 PM   Modules accepted: Orders

## 2024-02-05 NOTE — Telephone Encounter (Signed)
 Spoke with pt and informed her of her sleep study results. Pt agreed to the CPAP machine. I have faxed the signed order back to [facility].

## 2024-02-09 ENCOUNTER — Encounter: Payer: Self-pay | Admitting: Internal Medicine

## 2024-02-09 ENCOUNTER — Ambulatory Visit: Admitting: Internal Medicine

## 2024-02-09 VITALS — BP 124/62 | HR 54 | Temp 97.6°F | Ht 61.5 in | Wt 166.4 lb

## 2024-02-09 DIAGNOSIS — M791 Myalgia, unspecified site: Secondary | ICD-10-CM | POA: Diagnosis not present

## 2024-02-09 DIAGNOSIS — F409 Phobic anxiety disorder, unspecified: Secondary | ICD-10-CM | POA: Diagnosis not present

## 2024-02-09 DIAGNOSIS — F5105 Insomnia due to other mental disorder: Secondary | ICD-10-CM

## 2024-02-09 DIAGNOSIS — E782 Mixed hyperlipidemia: Secondary | ICD-10-CM

## 2024-02-09 DIAGNOSIS — R0683 Snoring: Secondary | ICD-10-CM

## 2024-02-09 DIAGNOSIS — N1831 Chronic kidney disease, stage 3a: Secondary | ICD-10-CM | POA: Diagnosis not present

## 2024-02-09 DIAGNOSIS — R7303 Prediabetes: Secondary | ICD-10-CM | POA: Diagnosis not present

## 2024-02-09 DIAGNOSIS — F322 Major depressive disorder, single episode, severe without psychotic features: Secondary | ICD-10-CM | POA: Insufficient documentation

## 2024-02-09 MED ORDER — DULOXETINE HCL 20 MG PO CPEP: 20.0000 mg | ORAL_CAPSULE | Freq: Every day | ORAL | 1 refills | Status: AC

## 2024-02-09 NOTE — Assessment & Plan Note (Signed)
 A1c is now 6.4 and she has gained weight  Lab Results  Component Value Date   HGBA1C 6.4 01/12/2024

## 2024-02-09 NOTE — Assessment & Plan Note (Addendum)
 Improved with 2nd trial of trazodone  taken 3 hours prior to bedtime which  has helped her stay asleep but not fall asleep  still lying awake for 3 hours.    Encouraged to change her nighttime routine of using cell phone and IPAD up until bedtime

## 2024-03-08 ENCOUNTER — Ambulatory Visit: Admitting: Internal Medicine

## 2024-03-09 ENCOUNTER — Ambulatory Visit: Admitting: Clinical

## 2024-08-29 ENCOUNTER — Ambulatory Visit
